# Patient Record
Sex: Male | Born: 1945
Health system: Southern US, Community
[De-identification: ages and names within clinical notes are randomized; demographics above are authoritative.]

## PROBLEM LIST (undated history)

## (undated) DIAGNOSIS — R0902 Hypoxemia: Secondary | ICD-10-CM

## (undated) DIAGNOSIS — I2510 Atherosclerotic heart disease of native coronary artery without angina pectoris: Secondary | ICD-10-CM

## (undated) DIAGNOSIS — I739 Peripheral vascular disease, unspecified: Secondary | ICD-10-CM

## (undated) DIAGNOSIS — G5600 Carpal tunnel syndrome, unspecified upper limb: Secondary | ICD-10-CM

## (undated) DIAGNOSIS — E785 Hyperlipidemia, unspecified: Secondary | ICD-10-CM

## (undated) DIAGNOSIS — E119 Type 2 diabetes mellitus without complications: Secondary | ICD-10-CM

## (undated) DIAGNOSIS — C61 Malignant neoplasm of prostate: Secondary | ICD-10-CM

## (undated) DIAGNOSIS — G709 Myoneural disorder, unspecified: Secondary | ICD-10-CM

## (undated) DIAGNOSIS — I10 Essential (primary) hypertension: Secondary | ICD-10-CM

## (undated) DIAGNOSIS — T783XXA Angioneurotic edema, initial encounter: Secondary | ICD-10-CM

## (undated) DIAGNOSIS — C801 Malignant (primary) neoplasm, unspecified: Secondary | ICD-10-CM

## (undated) HISTORY — DX: Peripheral vascular disease, unspecified: I73.9

## (undated) HISTORY — DX: Carpal tunnel syndrome, unspecified upper limb: G56.00

## (undated) HISTORY — DX: Hypoxemia: R09.02

## (undated) HISTORY — DX: Hyperlipidemia, unspecified: E78.5

## (undated) HISTORY — DX: Malignant (primary) neoplasm, unspecified: C80.1

## (undated) HISTORY — DX: Essential (primary) hypertension: I10

## (undated) HISTORY — DX: Type 2 diabetes mellitus without complications: E11.9

## (undated) HISTORY — DX: Atherosclerotic heart disease of native coronary artery without angina pectoris: I25.10

## (undated) HISTORY — DX: Angioneurotic edema, initial encounter: T78.3XXA

## (undated) HISTORY — PX: HERNIA REPAIR: SHX51

## (undated) HISTORY — PX: COLON SURGERY: SHX602

---

## 1998-08-27 ENCOUNTER — Ambulatory Visit (HOSPITAL_COMMUNITY): Admission: RE | Admit: 1998-08-27 | Discharge: 1998-08-27 | Payer: Self-pay | Admitting: Orthopedic Surgery

## 1998-08-27 ENCOUNTER — Encounter: Payer: Self-pay | Admitting: Orthopedic Surgery

## 2002-02-05 ENCOUNTER — Emergency Department (HOSPITAL_COMMUNITY): Admission: EM | Admit: 2002-02-05 | Discharge: 2002-02-05 | Payer: Self-pay | Admitting: Emergency Medicine

## 2003-01-22 HISTORY — PX: PROSTATE SURGERY: SHX751

## 2003-09-13 ENCOUNTER — Encounter: Admission: RE | Admit: 2003-09-13 | Discharge: 2003-09-13 | Payer: Self-pay | Admitting: Internal Medicine

## 2004-08-09 ENCOUNTER — Inpatient Hospital Stay (HOSPITAL_COMMUNITY): Admission: EM | Admit: 2004-08-09 | Discharge: 2004-08-10 | Payer: Self-pay | Admitting: Emergency Medicine

## 2004-08-09 ENCOUNTER — Encounter (INDEPENDENT_AMBULATORY_CARE_PROVIDER_SITE_OTHER): Payer: Self-pay | Admitting: *Deleted

## 2004-08-20 ENCOUNTER — Ambulatory Visit (HOSPITAL_COMMUNITY): Admission: RE | Admit: 2004-08-20 | Discharge: 2004-08-20 | Payer: Self-pay | Admitting: Cardiology

## 2009-06-16 ENCOUNTER — Emergency Department (HOSPITAL_COMMUNITY): Admission: EM | Admit: 2009-06-16 | Discharge: 2009-06-16 | Payer: Self-pay | Admitting: Emergency Medicine

## 2010-04-09 LAB — URINALYSIS, ROUTINE W REFLEX MICROSCOPIC
Bilirubin Urine: NEGATIVE
Hgb urine dipstick: NEGATIVE
Specific Gravity, Urine: 1.02 (ref 1.005–1.030)
pH: 5 (ref 5.0–8.0)

## 2010-04-09 LAB — DIFFERENTIAL
Basophils Relative: 1 % (ref 0–1)
Lymphocytes Relative: 51 % — ABNORMAL HIGH (ref 12–46)
Monocytes Absolute: 0.5 10*3/uL (ref 0.1–1.0)
Monocytes Relative: 5 % (ref 3–12)
Neutro Abs: 4.2 10*3/uL (ref 1.7–7.7)

## 2010-04-09 LAB — COMPREHENSIVE METABOLIC PANEL
Albumin: 4.3 g/dL (ref 3.5–5.2)
Alkaline Phosphatase: 113 U/L (ref 39–117)
BUN: 50 mg/dL — ABNORMAL HIGH (ref 6–23)
GFR calc Af Amer: 18 mL/min — ABNORMAL LOW (ref >60)
Potassium: 3.7 mEq/L (ref 3.5–5.1)
Total Protein: 8.3 g/dL (ref 6.0–8.3)

## 2010-04-09 LAB — CBC
HCT: 48.3 % (ref 39.0–52.0)
Platelets: 242 10*3/uL (ref 150–400)
RDW: 14.1 % (ref 11.5–15.5)

## 2010-06-08 NOTE — H&P (Signed)
NAME:  Alexander [name], Alexander [name] NO.:  [id number]   MEDICAL RECORD NO.:  [id number]          PATIENT TYPE:  EMS   LOCATION:  [facility]                         FACILITY:  [facility]   PHYSICIAN:  Lonia Blood, M.D.      DATE OF BIRTH:  [birth year]   DATE OF ADMISSION:  08/09/2004  DATE OF DISCHARGE:                                HISTORY & PHYSICAL   PRIMARY CARE PHYSICIAN:  [facility].   CHIEF COMPLAINT:  Shoulder pain.   HISTORY OF PRESENT ILLNESS:  The patient is a 65 year old African-American  male with history of hypertension and recently diagnosed prostate cancer,  who goes to [facility]. He went in today secondary to his shoulder  pain and generalized muscle aches that has been going on for a while. At  work the patient does heavy lifting and believed that was responsible. At  [facility] after checking an EKG they were concerned that he may have a cardiac  reason for his symptoms and subsequently sent him over to the ER and called  [facility] to see the patient. There was no chest pain. He was given  aspirin there and they wanted him to have myocardial infarction ruled out.  While in the emergency department patient had a basic work up and his  creatinine was found to be 4.7. Subsequently cardiology was called who  decided the patient did not have any cardiac reasons. EKG changes were said  to be mainly secondary to lead placement versus cardiomegaly. Hence we were  called to admit the patient.   PAST MEDICAL HISTORY:  1.  Mainly hypertension.  2.  Recently diagnosed with possible prostate cancer according to him.   MEDICATIONS:  Diovan 80 mg daily.   ALLERGIES:  No known drug allergies.   SOCIAL HISTORY:  He is married and lives with his wife. Very much sexually  active. He is also active otherwise. No tobacco or alcohol use. The patient  smokes about 1/2 pack per day.   FAMILY HISTORY:  Significant mainly for high blood pressure in his family.   REVIEW OF SYSTEMS:  The patient denied weight gain or weight loss. Denied  any shortness of breath. No cough, no chest pain. Denied any urinary  symptoms. The patient has had some post coital bleeding but that has  resolved now. Otherwise a 10 point review of systems is as in history of  present illness.   PHYSICAL EXAMINATION:  VITAL SIGNS:  Temperature 98.3, blood pressure  172/74, pulse 76, respiratory rate 22. Saturations 98% on room air.  GENERAL:  He is alert, oriented, in no acute distress.  HEENT:  Pupils are equal, round and reactive to light. Extraocular movements  are intact.  NECK:  Supple, no JVD, no lymphadenopathy.  RESPIRATORY:  Good air entry bilaterally. No wheezes, no rales.  CARDIOVASCULAR:  He has a regular rate and rhythm.  ABDOMEN:  Soft, nontender with positive bowel sounds.  EXTREMITIES:  Show no clubbing, cyanosis or edema.   LABORATORY DATA:  Sodium 134, potassium 3.9, chloride 104, BUN 36,  creatinine 4.7, glucose 130. Hemoglobin  was 17. His EKG showed normal sinus  rhythm with diffuse T wave inversion in the anterolateral leads. Normal  intervals.   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 The  patient is here mainly with muscle aches and ST changes consistent with  inferolateral ischemia but with no chest pain. Initial cardiac enzymes in  the emergency department are found to be negative at this point. The patient  has been admitted mainly for work up of what appears to be acute renal  failure. We do not have old labs, it is therefore difficult to tell if this  is truly acute. We also do not know anything about his cholesterol levels,  but he does have risk factors for cardiac disease. His renal failure could  be resolved using his ARB, especially in the setting of dehydration (if he  had any). Another possibility is that he is having obstructive  uropathy,  especially with a history of prostate cancer.   PLAN:  1.  Acute renal failure. Will assume this is acute and will work it up as if      it is acute. I will check an renal ultrasound. Check spot urine sodium      and creatinine. Check his FENA.  Will also check uroflow just in case this is an ATN. Will follow his renal  function closely after hydrating patient adequately. If his creatinine does  not drop adequately, then we may have to call nephrology and further work up  may include renal biopsy.   1.  Abnormal EKG. I doubt this is cardiac in nature, however, due to the      prevailing symptoms and the risk factors for heart disease in the      patient including hypertension as well as tobacco smoking and being a      male, will go ahead and put him on telemetry, check serial cardiac      enzymes and rule him out for myocardial infarction. He has already been      seen by  [facility] and if his enzymes come back as positive      will anything indicates that this may be cardiac in nature, we will call      them back to see the patient. I will repeat an EKG in the morning.   1.  History of prostate cancer. The patient seems to be stable at this point      and he has already seen a urologist in [facility]. Unless we have      something obstructed that requires any further urologic work up, will      let patient follow up with his urologist after his hospital stay.   1.  Hypertension. His blood pressure seems to be well controlled and I am      going to hold the Diovan due to his renal failure. If his blood pressure      spikes I will try to use something that does not have an renal      implication.      Lonia Blood, M.D.  Electronically Signed     LG/MEDQ  D:  08/09/2004  T:  08/10/2004  Job:  [id number]

## 2010-12-27 ENCOUNTER — Ambulatory Visit: Payer: Self-pay

## 2010-12-27 DIAGNOSIS — I10 Essential (primary) hypertension: Secondary | ICD-10-CM

## 2010-12-27 DIAGNOSIS — J309 Allergic rhinitis, unspecified: Secondary | ICD-10-CM

## 2011-02-27 ENCOUNTER — Telehealth: Payer: Self-pay

## 2011-02-27 NOTE — Telephone Encounter (Signed)
PT IS WANTING TO TALK WITH SOMEONE ABOUT HIS BLOOD PRESSURE MEDICATION

## 2011-02-28 NOTE — Telephone Encounter (Signed)
SPOKE WITH PT, HE NEEDS REFILL ON HIS BP MED BYSTOLIC 10 MG ONCE DAILY. CALL INTO [facility] ON [address]. PT TRANSFERRED TO APPT TO MAKE APPT FOR F/U

## 2011-03-09 ENCOUNTER — Encounter: Payer: Self-pay | Admitting: Family Medicine

## 2011-03-09 DIAGNOSIS — I10 Essential (primary) hypertension: Secondary | ICD-10-CM | POA: Insufficient documentation

## 2011-03-09 DIAGNOSIS — Z8546 Personal history of malignant neoplasm of prostate: Secondary | ICD-10-CM | POA: Insufficient documentation

## 2011-03-11 ENCOUNTER — Ambulatory Visit: Payer: Medicare Other | Admitting: Family Medicine

## 2011-04-02 ENCOUNTER — Ambulatory Visit (INDEPENDENT_AMBULATORY_CARE_PROVIDER_SITE_OTHER): Payer: Medicare Other | Admitting: Family Medicine

## 2011-04-02 VITALS — BP 180/106 | HR 80 | Temp 98.3°F | Resp 16 | Ht 64.5 in | Wt 147.2 lb

## 2011-04-02 DIAGNOSIS — C801 Malignant (primary) neoplasm, unspecified: Secondary | ICD-10-CM

## 2011-04-02 DIAGNOSIS — J309 Allergic rhinitis, unspecified: Secondary | ICD-10-CM

## 2011-04-02 DIAGNOSIS — Z8709 Personal history of other diseases of the respiratory system: Secondary | ICD-10-CM

## 2011-04-02 DIAGNOSIS — I10 Essential (primary) hypertension: Secondary | ICD-10-CM

## 2011-04-02 MED ORDER — FLUTICASONE PROPIONATE 50 MCG/ACT NA SUSP: 2.0000 | Freq: Every day | NASAL | Status: DC

## 2011-04-02 MED ORDER — LABETALOL HCL 200 MG PO TABS: 200.0000 mg | ORAL_TABLET | Freq: Two times a day (BID) | ORAL | Status: DC

## 2011-04-02 MED ORDER — LABETALOL HCL 100 MG PO TABS
200.0000 mg | ORAL_TABLET | Freq: Once | ORAL | Status: AC
  Administered 2011-04-02: 200 mg via ORAL

## 2011-04-03 LAB — COMPREHENSIVE METABOLIC PANEL
ALT: 14 U/L (ref 0–53)
AST: 16 U/L (ref 0–37)
Creat: 1 mg/dL (ref 0.50–1.35)
Total Bilirubin: 0.4 mg/dL (ref 0.3–1.2)

## 2011-04-03 LAB — LIPID PANEL
HDL: 43 mg/dL (ref >39)
LDL Cholesterol: 137 mg/dL — ABNORMAL HIGH (ref 0–99)
Total CHOL/HDL Ratio: 4.7 Ratio
Triglycerides: 102 mg/dL (ref <150)
VLDL: 20 mg/dL (ref 0–40)

## 2011-04-08 DIAGNOSIS — C801 Malignant (primary) neoplasm, unspecified: Secondary | ICD-10-CM | POA: Insufficient documentation

## 2011-04-08 DIAGNOSIS — J310 Chronic rhinitis: Secondary | ICD-10-CM | POA: Insufficient documentation

## 2011-04-08 DIAGNOSIS — I10 Essential (primary) hypertension: Secondary | ICD-10-CM | POA: Insufficient documentation

## 2011-04-08 NOTE — Progress Notes (Signed)
  Subjective:    Patient ID: Alexander [name], male    DOB: 09-07-1945, 66 y.o.   MRN: [id number]  HPI Patient presents for medication refills. He has been without his medications for several weeks.    Review of Systems  Respiratory: Negative for shortness of breath.   Cardiovascular: Negative for chest pain.  Neurological: Negative for light-headedness and headaches.       Objective:   Physical Exam  Constitutional: He appears well-developed.  HENT:  Nose: Mucosal edema present.  Neck: Neck supple.  Cardiovascular: Normal rate, regular rhythm and normal heart sounds.   Pulmonary/Chest: Effort normal and breath sounds normal.  Neurological: He is alert.  Psychiatric: He has a normal mood and affect.          Assessment & Plan:   1. HTN (hypertension)  Lipid panel, Comprehensive metabolic panel, labetalol (NORMODYNE) tablet 200 mg  2. Allergic rhinitis    3. Prostate cancer    4. History of asthma     See medications prescribed on AVS Patient to return to clinic in 6 weeks to recheck BP. Anticipatory guidance

## 2011-04-10 ENCOUNTER — Telehealth: Payer: Self-pay

## 2011-04-10 NOTE — Telephone Encounter (Signed)
Gave pt results of labs and pt agreed to resume meds and f/up in several wks for BP check

## 2011-04-10 NOTE — Telephone Encounter (Signed)
Message copied by Jerelene Redden on Wed Apr 10, 2011 10:15 AM ------      Message from: Dois Davenport      Created: Tue Apr 09, 2011  6:18 PM       Please contact patient and let him know his labs are OK.  He was to resume his medications and follow up in several weeks to recheck his BP. Thanks

## 2011-04-11 ENCOUNTER — Ambulatory Visit (INDEPENDENT_AMBULATORY_CARE_PROVIDER_SITE_OTHER): Payer: Medicare Other | Admitting: Family Medicine

## 2011-04-11 VITALS — BP 198/76 | HR 76 | Temp 97.3°F | Resp 16 | Ht 64.5 in | Wt 149.6 lb

## 2011-04-11 DIAGNOSIS — M79609 Pain in unspecified limb: Secondary | ICD-10-CM

## 2011-04-11 DIAGNOSIS — G5760 Lesion of plantar nerve, unspecified lower limb: Secondary | ICD-10-CM

## 2011-04-11 MED ORDER — HYDROCODONE-ACETAMINOPHEN 5-500 MG PO TABS: 1.0000 | ORAL_TABLET | Freq: Three times a day (TID) | ORAL | Status: DC | PRN

## 2011-04-11 MED ORDER — METHYLPREDNISOLONE 4 MG PO TABS: ORAL_TABLET | ORAL | Status: DC

## 2011-04-11 NOTE — Progress Notes (Signed)
66 year old retired man with 2 weeks of progressive throbbing and shooting left foot pain into the second and third phalanx sees. He's had this problem before and received a cortisone shot on the bottom of the foot. This worked for a while but the pain has come back and over the past 2 days been especially bothersome keeping him awake at night. He does have a pain with weightbearing or movement. The pain simply comes on its own and is sharp, spontaneous, involving second third left toes.  Objective: Normal inspection, range of motion, palpation.  Skin is warm and dry. Straight leg raising is negative assessment: Morton's neuroma  Plan Medrol and Vicodin, if no better 48 hours return for

## 2011-04-11 NOTE — Patient Instructions (Signed)
Morton's Neuroma Neuralgia (nerve pain) or neuroma (benign [non-cancerous] nerve tumor) may develop on any interdigital nerve. The interdigital nerves (nerves between digits) of the foot travel beneath and between the metatarsals (long bones of the fore foot) and pass the nerve endings to the toes. The third interdigital is a common place for a small neuroma to form called Morton's neuroma. Another nerve to be affected commonly is the fourth interdigital nerve. This would be in approximately in the area of the base or ball under the bottom of your fourth toe. This condition occurs more commonly in women and is usually on one side. It is usually first noticed by pain radiating (spreading) to the ball of the foot or to the toes. CAUSES The cause of interdigital neuralgia may be from low grade repetitive trauma (damage caused by an accident) as in activities causing a repeated pounding of the foot (running, jumping etc.). It is also caused by improper footwear or recent loss of the fatty padding on the bottom of the foot. TREATMENT  The condition often resolves (goes away) simply with decreasing activity if that is thought to be the cause. Proper shoes are beneficial. Orthotics (special foot support aids) such as a metatarsal bar are often beneficial. This condition usually responds to conservative therapy, however if surgery is necessary it usually brings complete relief. HOME CARE INSTRUCTIONS   Apply ice to the area of soreness for 15 to 20 minutes, 3 to 4 times per day, while awake for the first 2 days. Put ice in a plastic bag and place a towel between the bag of ice and your skin.   Only take over-the-counter or prescription medicines for pain, discomfort, or fever as directed by your caregiver.  MAKE SURE YOU:   Understand these instructions.   Will watch your condition.   Will get help right away if you are not doing well or get worse.  Document Released: 04/15/2000 Document Revised:  12/27/2010 Document Reviewed: 01/07/2005 Exit[employer] Patient Information 2012 [employer]. 

## 2011-04-15 ENCOUNTER — Ambulatory Visit (INDEPENDENT_AMBULATORY_CARE_PROVIDER_SITE_OTHER): Payer: Medicare Other | Admitting: Family Medicine

## 2011-04-15 VITALS — BP 179/74 | HR 82 | Temp 98.2°F | Resp 18 | Ht 64.5 in | Wt 147.4 lb

## 2011-04-15 DIAGNOSIS — M79609 Pain in unspecified limb: Secondary | ICD-10-CM

## 2011-04-15 DIAGNOSIS — M79673 Pain in unspecified foot: Secondary | ICD-10-CM

## 2011-04-15 NOTE — Progress Notes (Signed)
  Subjective:    Patient ID: Alexander [name], male    DOB: January 29, 1945, 66 y.o.   MRN: [id number]  HPI 66 yo male seen here 4 days ago for foot pain. Dx with morton's neuroma.  Given medrol and vicodin.  Did get better but bothering him again this morning.  Hasn't bothered him this evening since he took a shower.  Didn't hurt near as bad as it did originally.  Doing much better now.       Review of Systems Negative except as per HPI     Objective:   Physical Exam  Constitutional: He appears well-developed.  Pulmonary/Chest: Effort normal.  Neurological: He is alert.   Negative pain to palpation.  No swelling or erythema       Assessment & Plan:  Foot pain - resolving.  F/U prn

## 2011-04-19 ENCOUNTER — Other Ambulatory Visit: Payer: Self-pay | Admitting: Family Medicine

## 2011-04-19 ENCOUNTER — Telehealth: Payer: Self-pay | Admitting: Internal Medicine

## 2011-04-19 NOTE — Telephone Encounter (Signed)
Refilled Vicodin 5/500 #20 one po TID NO REFILLS.  Called into [facility] by Eddie Candle, PA-C

## 2011-06-16 ENCOUNTER — Ambulatory Visit (INDEPENDENT_AMBULATORY_CARE_PROVIDER_SITE_OTHER): Payer: Medicare Other | Admitting: Family Medicine

## 2011-06-16 VITALS — BP 182/91 | HR 81 | Temp 98.3°F | Resp 16 | Ht 64.5 in | Wt 152.2 lb

## 2011-06-16 DIAGNOSIS — R5381 Other malaise: Secondary | ICD-10-CM

## 2011-06-16 DIAGNOSIS — R5383 Other fatigue: Secondary | ICD-10-CM

## 2011-06-16 LAB — POCT URINALYSIS DIPSTICK
Bilirubin, UA: NEGATIVE
Blood, UA: NEGATIVE
Glucose, UA: NEGATIVE
Ketones, UA: NEGATIVE
Leukocytes, UA: NEGATIVE
Nitrite, UA: NEGATIVE
Spec Grav, UA: >=1.03
Urobilinogen, UA: 1
pH, UA: 6

## 2011-06-16 LAB — POCT CBC
Granulocyte percent: 46.9 % (ref 37–80)
HCT, POC: 42.2 % — AB (ref 43.5–53.7)
Hemoglobin: 15.2 g/dL (ref 14.1–18.1)
Lymph, poc: 3.6 — AB (ref 0.6–3.4)
MCH, POC: 32.5 pg — AB (ref 27–31.2)
MCHC: 36 g/dL — AB (ref 31.8–35.4)
MCV: 90.4 fL (ref 80–97)
MID (cbc): 0.7 (ref 0–0.9)
MPV: 10.7 fL (ref 0–99.8)
POC Granulocyte: 3.8 (ref 2–6.9)
POC LYMPH PERCENT: 45 % (ref 10–50)
POC MID %: 8.1 % (ref 0–12)
Platelet Count, POC: 229 K/uL (ref 142–424)
RBC: 4.67 M/uL — AB (ref 4.69–6.13)
RDW, POC: 14.5 %
WBC: 8.1 K/uL (ref 4.6–10.2)

## 2011-06-16 LAB — POCT UA - MICROSCOPIC ONLY
Bacteria, U Microscopic: NEGATIVE
Casts, Ur, LPF, POC: NEGATIVE
Crystals, Ur, HPF, POC: NEGATIVE
Epithelial cells, urine per micros: NEGATIVE
RBC, urine, microscopic: NEGATIVE
WBC, Ur, HPF, POC: NEGATIVE
Yeast, UA: NEGATIVE

## 2011-06-16 NOTE — Progress Notes (Signed)
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  F/Hx:  Positive for DM  Obj:  NAD HEENT:  Poor dentition Chest:  Clear Heart:  Reg, no murmur Abd:  Soft, no HSM, nontender Ext:  No edema Skin: clear  A:  Fatigue without obvious cause  P: check CBC, thyroid, CMET

## 2011-06-16 NOTE — Patient Instructions (Signed)

## 2011-06-17 LAB — COMPREHENSIVE METABOLIC PANEL
ALT: 14 U/L (ref 0–53)
AST: 14 U/L (ref 0–37)
Albumin: 4.3 g/dL (ref 3.5–5.2)
Alkaline Phosphatase: 127 U/L — ABNORMAL HIGH (ref 39–117)
BUN: 11 mg/dL (ref 6–23)
CO2: 25 mEq/L (ref 19–32)
Calcium: 9.4 mg/dL (ref 8.4–10.5)
Chloride: 104 mEq/L (ref 96–112)
Creat: 1 mg/dL (ref 0.50–1.35)
Glucose, Bld: 139 mg/dL — ABNORMAL HIGH (ref 70–99)
Potassium: 4 mEq/L (ref 3.5–5.3)
Sodium: 137 mEq/L (ref 135–145)
Total Bilirubin: 0.4 mg/dL (ref 0.3–1.2)
Total Protein: 6.9 g/dL (ref 6.0–8.3)

## 2011-06-17 LAB — POCT SEDIMENTATION RATE: POCT SED RATE: 18 mm/hr (ref 0–22)

## 2011-06-17 LAB — TSH: TSH: 1.322 u[IU]/mL (ref 0.350–4.500)

## 2011-06-20 ENCOUNTER — Telehealth: Payer: Self-pay | Admitting: *Deleted

## 2011-06-20 DIAGNOSIS — I10 Essential (primary) hypertension: Secondary | ICD-10-CM

## 2011-06-20 DIAGNOSIS — M549 Dorsalgia, unspecified: Secondary | ICD-10-CM

## 2011-06-20 DIAGNOSIS — F329 Major depressive disorder, single episode, unspecified: Secondary | ICD-10-CM

## 2011-06-20 DIAGNOSIS — F32A Depression, unspecified: Secondary | ICD-10-CM

## 2011-06-20 MED ORDER — LABETALOL HCL 200 MG PO TABS: 200.0000 mg | ORAL_TABLET | Freq: Two times a day (BID) | ORAL | Status: DC

## 2011-06-20 MED ORDER — NEBIVOLOL HCL 10 MG PO TABS: 10.0000 mg | ORAL_TABLET | Freq: Every day | ORAL | Status: DC

## 2011-06-20 MED ORDER — HYDROCODONE-ACETAMINOPHEN 5-500 MG PO TABS: 1.0000 | ORAL_TABLET | Freq: Three times a day (TID) | ORAL | Status: DC | PRN

## 2011-06-20 MED ORDER — FLUOXETINE HCL 20 MG PO TABS: 20.0000 mg | ORAL_TABLET | Freq: Every day | ORAL | Status: DC

## 2011-06-20 NOTE — Telephone Encounter (Signed)
meds refilled 

## 2011-06-20 NOTE — Telephone Encounter (Signed)
Pt notified that rx's were sent into pharmacy

## 2011-06-20 NOTE — Telephone Encounter (Signed)
Pt would like to start on an antidepressant and he would like a refill on blood pressure med and pain med.

## 2011-08-24 ENCOUNTER — Other Ambulatory Visit: Payer: Self-pay | Admitting: Internal Medicine

## 2011-08-24 NOTE — Telephone Encounter (Signed)
Would like Dr. Elbert Ewings to authorize refill for hydrocodone RX. Uses [facility], patient phone # [phone].

## 2012-08-26 ENCOUNTER — Ambulatory Visit (INDEPENDENT_AMBULATORY_CARE_PROVIDER_SITE_OTHER): Payer: Medicare Other | Admitting: Family Medicine

## 2012-08-26 VITALS — BP 148/82 | HR 60 | Temp 97.6°F | Resp 16 | Ht 64.5 in | Wt 138.6 lb

## 2012-08-26 DIAGNOSIS — M79609 Pain in unspecified limb: Secondary | ICD-10-CM

## 2012-08-26 DIAGNOSIS — M79605 Pain in left leg: Secondary | ICD-10-CM

## 2012-08-26 MED ORDER — PREDNISONE 20 MG PO TABS: ORAL_TABLET | ORAL | Status: DC

## 2012-08-26 MED ORDER — HYDROCODONE-ACETAMINOPHEN 10-325 MG PO TABS: 1.0000 | ORAL_TABLET | Freq: Three times a day (TID) | ORAL | Status: DC | PRN

## 2012-08-26 NOTE — Progress Notes (Signed)
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  Pain awoke patient about 4 am.  He has a h/o sciatica 23 years ago.  Most of pain has resolved with BC powders.  Objective:  NAD Left foot and leg:  nontender Good pedal pulses No edema Negative SLR Full left leg ROM from hip to foot  Assessment:  Presumed sciatica, but may be a morton's neuroma  Left leg pain - Plan: predniSONE (DELTASONE) 20 MG tablet, HYDROcodone-acetaminophen (NORCO) 10-325 MG per tablet  Signed, Elvina Sidle, MD

## 2012-09-09 ENCOUNTER — Encounter (INDEPENDENT_AMBULATORY_CARE_PROVIDER_SITE_OTHER): Payer: Medicare Other | Admitting: Family Medicine

## 2012-09-28 ENCOUNTER — Ambulatory Visit (INDEPENDENT_AMBULATORY_CARE_PROVIDER_SITE_OTHER): Payer: Medicare Other | Admitting: Family Medicine

## 2012-09-28 VITALS — BP 180/84 | HR 80 | Temp 98.0°F | Resp 17 | Ht 64.5 in | Wt 138.0 lb

## 2012-09-28 DIAGNOSIS — R1031 Right lower quadrant pain: Secondary | ICD-10-CM

## 2012-09-28 DIAGNOSIS — K5900 Constipation, unspecified: Secondary | ICD-10-CM

## 2012-09-28 DIAGNOSIS — M5432 Sciatica, left side: Secondary | ICD-10-CM

## 2012-09-28 DIAGNOSIS — M5430 Sciatica, unspecified side: Secondary | ICD-10-CM

## 2012-09-28 NOTE — Progress Notes (Signed)
  Subjective:    Patient ID: Alexander [name], male    DOB: 1945/06/21, 67 y.o.   MRN: [id number]  HPI  67 y.o. Male presents to clinic for follow up of left leg pain . Denies any trouble with leg feeling as if it is going to give out. Has not had any swelling or soreness to touch. Problem has been going on for over a month.  Had trouble with this years ago.  Patient is a retired Actor.  Was prescribed prednisone but states that pain didn't really let up . Notices in the morning upon waking up he has a burning sensation in the back of the leg. Last about all day long.   Patient also having some discomfort in his right lower quadrant. He's been constipated and has been going on for couple weeks. Said no blood per rectum. He's never had a colonoscopy.  Review of Systems No weight loss Has had prostate surgery in the past for cancer of the prostate    Objective:   Physical Exam Straight-leg raising negative Muscle mass is normal both legs Reflexes are diminished in the left knee and left ankle. He has no edema . Skin is normal bilaterally Heart is regular no murmur Chest: Clear Abdomen: Soft nontender with laparoscopy scars in the right upper quadrant and midline. He has some fullness in the right lower quadrant.     Assessment & Plan:  Sciatica, left - Plan: Ambulatory referral to Neurology, MR Lumbar Spine Wo Contrast  Unspecified constipation - Plan: Ambulatory referral to Gastroenterology  RLQ abdominal pain - Plan: Ambulatory referral to Gastroenterology  Signed, Elvina Sidle, MD

## 2012-09-30 ENCOUNTER — Encounter: Payer: Self-pay | Admitting: Diagnostic Neuroimaging

## 2012-09-30 ENCOUNTER — Ambulatory Visit (INDEPENDENT_AMBULATORY_CARE_PROVIDER_SITE_OTHER): Payer: Medicare Other | Admitting: Diagnostic Neuroimaging

## 2012-09-30 VITALS — BP 158/80 | HR 66 | Temp 98.9°F | Ht 65.5 in | Wt 141.0 lb

## 2012-09-30 DIAGNOSIS — M5416 Radiculopathy, lumbar region: Secondary | ICD-10-CM

## 2012-09-30 DIAGNOSIS — IMO0002 Reserved for concepts with insufficient information to code with codable children: Secondary | ICD-10-CM

## 2012-09-30 MED ORDER — GABAPENTIN 300 MG PO CAPS: 300.0000 mg | ORAL_CAPSULE | Freq: Three times a day (TID) | ORAL | Status: DC

## 2012-09-30 NOTE — Patient Instructions (Signed)
Start gabapentin 300 mg tablet at nighttime. Gradually increase to twice a day or 3 times a day as tolerated. Try to stay on a regular dose for at least next 4-6 weeks.

## 2012-09-30 NOTE — Progress Notes (Signed)
[facility]  PATIENT: Alexander [name] DOB: 02/14/1945  REFERRING CLINICIAN: Lauenstein HISTORY FROM: patient REASON FOR VISIT: new consult   HISTORICAL  CHIEF COMPLAINT:  Chief Complaint  Patient presents with  . NP sciatica    rm 6    HISTORY OF PRESENT ILLNESS:   67 year old right-handed male with history of hypertension, here for evaluation of low back pain radiating to left leg.  Patient reports long history of back problems since age 21's. At age 15 his back "gave out". He had severe low back pain radiating to left leg. He was taking Celebrex at some point but this caused blood in the stool. He had MRI scan of the lumbar spine in 2000 which showed disc bulging and "spurring". He did not pursue surgical intervention at that time.  Patient was doing fairly well until 2 months ago when he had recurrence of severe left hip, left buttock and left leg burning pain. He feels severe pressure from his left knee down to his left foot. Patient has been treated with course of prednisone with mild relief. He is also on hydrocodone/Tylenol with mild relief. He has not tried physical therapy or epidural steroid injections.  REVIEW OF SYSTEMS: Full 14 system review of systems performed and notable only for low back pain and left leg pain.  ALLERGIES: No Known Allergies  HOME MEDICATIONS: Prior to Admission medications   Medication Sig Start Date End Date Taking? Authorizing Provider  HYDROcodone-acetaminophen (NORCO) 10-325 MG per tablet Take 1 tablet by mouth every 8 (eight) hours as needed for pain. 08/26/12  Yes Elvina Sidle, MD  labetalol (NORMODYNE) 200 MG tablet Take 1 tablet (200 mg total) by mouth 2 (two) times daily. 06/20/11  Yes Elvina Sidle, MD  nebivolol (BYSTOLIC) 10 MG tablet Take 1 tablet (10 mg total) by mouth daily. 06/20/11  Yes Elvina Sidle, MD  gabapentin (NEURONTIN) 300 MG capsule Take 1 capsule (300 mg total) by mouth 3 (three) times  daily. 09/30/12   Suanne Marker, MD   Outpatient Prescriptions Prior to Visit  Medication Sig Dispense Refill  . HYDROcodone-acetaminophen (NORCO) 10-325 MG per tablet Take 1 tablet by mouth every 8 (eight) hours as needed for pain.  30 tablet  0  . labetalol (NORMODYNE) 200 MG tablet Take 1 tablet (200 mg total) by mouth 2 (two) times daily.  60 tablet  2  . nebivolol (BYSTOLIC) 10 MG tablet Take 1 tablet (10 mg total) by mouth daily.  30 tablet  6  . predniSONE (DELTASONE) 20 MG tablet 2 daily with food  10 tablet  1   No facility-administered medications prior to visit.    PAST MEDICAL HISTORY: Past Medical History  Diagnosis Date  . Cancer     prostate  . Hypertension   . Asthma     PAST SURGICAL HISTORY: Past Surgical History  Procedure Laterality Date  . Prostate surgery  2005    FAMILY HISTORY: Family History  Problem Relation Age of Onset  . Diabetes Mother   . Depression Father     SOCIAL HISTORY:  History   Social History  . Marital Status: Married    Spouse Name: N/A    Number of Children: 2  . Years of Education: 10   Occupational History  . retired    Social History Main Topics  . Smoking status: Current Every Day Smoker -- 1.00 packs/day for 51 years    Types: Cigarettes  . Smokeless tobacco: Former Neurosurgeon  .  Alcohol Use: No  . Drug Use: No  . Sexual Activity: Yes   Other Topics Concern  . Not on file   Social History Narrative  . No narrative on file     PHYSICAL EXAM  Filed Vitals:   09/30/12 0831  BP: 158/80  Pulse: 66  Temp: 98.9 F (37.2 C)  TempSrc: Oral  Height: 5' 5.5" (1.664 m)  Weight: 141 lb (63.957 kg)    Not recorded    Body mass index is 23.1 kg/(m^2).  GENERAL EXAM: Patient is in no distress; STRAIGHT AND CROSSED LEG RAISE NEG. SMELLS OF CIG SMOKE.  CARDIOVASCULAR: Regular rate and rhythm, no murmurs, no carotid bruits  NEUROLOGIC: MENTAL STATUS: awake, alert, language fluent, comprehension intact,  naming intact CRANIAL NERVE: no papilledema on fundoscopic exam, pupils equal and reactive to light, visual fields full to confrontation, extraocular muscles intact, no nystagmus, facial sensation and strength symmetric, uvula midline, shoulder shrug symmetric, tongue midline. MOTOR: normal bulk and tone, full strength in the BUE, BLE SENSORY: normal and symmetric to light touch, pinprick, temperature and proprioception; RIGHT TOE VIB 12 SEC; LEFT TOE VIB 7 SEC. COORDINATION: finger-nose-finger, fine finger movements normal REFLEXES: BUE 2, RIGHT KNEE 3+, LEFT KNEE 3-, ANKLES 2. DOWN GOING TOES. GAIT/STATION: narrow based gait; STOOPED POSTURE. ANTALGIC GAIT. Able to walk on toes, heels and tandem; romberg is negative   DIAGNOSTIC DATA (LABS, IMAGING, TESTING) - I reviewed patient records, labs, notes, testing and imaging myself where available.  Lab Results  Component Value Date   WBC 8.1 06/16/2011   HGB 15.2 06/16/2011   HCT 42.2* 06/16/2011   MCV 90.4 06/16/2011   PLT 242 06/16/2009      Component Value Date/Time   NA 137 06/16/2011 1805   K 4.0 06/16/2011 1805   CL 104 06/16/2011 1805   CO2 25 06/16/2011 1805   GLUCOSE 139* 06/16/2011 1805   BUN 11 06/16/2011 1805   CREATININE 1.00 06/16/2011 1805   CREATININE 4.13* 06/16/2009 1945   CALCIUM 9.4 06/16/2011 1805   PROT 6.9 06/16/2011 1805   ALBUMIN 4.3 06/16/2011 1805   AST 14 06/16/2011 1805   ALT 14 06/16/2011 1805   ALKPHOS 127* 06/16/2011 1805   BILITOT 0.4 06/16/2011 1805   GFRNONAA 15* 06/16/2009 1945   GFRAA  Value: 18        The eGFR has been calculated using the MDRD equation. This calculation has not been validated in all clinical situations. eGFR's persistently <60 mL/min signify possible Chronic Kidney Disease.* 06/16/2009 1945   Lab Results  Component Value Date   CHOL 200 04/02/2011   HDL 43 04/02/2011   LDLCALC 137* 04/02/2011   TRIG 102 04/02/2011   CHOLHDL 4.7 04/02/2011   No results found for this basename: HGBA1C   No  results found for this basename: VITAMINB12   Lab Results  Component Value Date   TSH 1.322 06/16/2011   08/28/98 MRI LUMBAR SPINE: (report paraphrased; images not available) L3-4: mild spinal stenosis and biforaminal foraminal stenosis  L4-5: mild spinal stenosis and mild right and moderate left foraminal stenosis  L5-S1: moderate spinal stenosis and moderate right and mild left foraminal stenosis; mass effect upon right L5 and bilateral S1 roots  ASSESSMENT AND PLAN  67 y.o. year old male here with low back pain radiating to the left leg. 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 MRI lumbar spine pending (ordered by Dr. Milus Glazier).  PLAN: 1. Gabapentin 300mg  qhs --> TID 2. PT eval  Orders Placed This Encounter  Procedures  . Ambulatory referral to Physical Therapy    Meds ordered this encounter  Medications  . gabapentin (NEURONTIN) 300 MG capsule    Sig: Take 1 capsule (300 mg total) by mouth 3 (three) times daily.    Dispense:  90 capsule    Refill:  11    Return in about 3 months (around 12/30/2012) for with Edison Nasuti, MD 09/30/2012, 9:21 AM Certified in Neurology, Neurophysiology and Neuroimaging  [facility] [address], [address] [place], [place] [address] [phone]

## 2012-10-08 ENCOUNTER — Telehealth: Payer: Self-pay

## 2012-10-08 DIAGNOSIS — I10 Essential (primary) hypertension: Secondary | ICD-10-CM

## 2012-10-08 DIAGNOSIS — M79605 Pain in left leg: Secondary | ICD-10-CM

## 2012-10-08 NOTE — Telephone Encounter (Signed)
Patient called needs refill on pain med. Hydrocodone. Please let pt know if this can be done. [phone]  Pharmacy [place]

## 2012-10-09 MED ORDER — HYDROCODONE-ACETAMINOPHEN 10-325 MG PO TABS: 1.0000 | ORAL_TABLET | Freq: Three times a day (TID) | ORAL | Status: DC | PRN

## 2012-10-09 NOTE — Telephone Encounter (Signed)
Called patient to advise  °

## 2012-10-09 NOTE — Telephone Encounter (Signed)
Thanks. I have called.

## 2012-10-09 NOTE — Addendum Note (Signed)
Addended byCaffie Damme on: 10/09/2012 08:44 AM   Modules accepted: Orders

## 2012-10-09 NOTE — Telephone Encounter (Signed)
Please call in refill of Norco.  I am out of office today.

## 2012-10-14 ENCOUNTER — Inpatient Hospital Stay: Admission: RE | Admit: 2012-10-14 | Payer: Medicare Other | Source: Ambulatory Visit

## 2012-10-19 ENCOUNTER — Inpatient Hospital Stay: Admission: RE | Admit: 2012-10-19 | Payer: Medicare Other | Source: Ambulatory Visit

## 2012-10-19 ENCOUNTER — Ambulatory Visit: Payer: Medicare Other | Admitting: Physical Therapy

## 2012-10-20 ENCOUNTER — Ambulatory Visit: Payer: Medicare Other | Attending: Diagnostic Neuroimaging | Admitting: Physical Therapy

## 2012-10-21 ENCOUNTER — Telehealth: Payer: Self-pay | Admitting: Diagnostic Neuroimaging

## 2012-10-21 NOTE — Telephone Encounter (Signed)
Can pt increase dose of gabapentin?

## 2012-10-21 NOTE — Telephone Encounter (Signed)
I called pt. He can go up on gabapentin dosing. On 300mg  TID. I advised him to build up to 600mg  TID.   -VRP

## 2012-10-26 ENCOUNTER — Ambulatory Visit: Payer: Medicare Other | Attending: Diagnostic Neuroimaging

## 2012-10-26 DIAGNOSIS — R293 Abnormal posture: Secondary | ICD-10-CM | POA: Insufficient documentation

## 2012-10-26 DIAGNOSIS — M2550 Pain in unspecified joint: Secondary | ICD-10-CM | POA: Insufficient documentation

## 2012-10-26 DIAGNOSIS — IMO0001 Reserved for inherently not codable concepts without codable children: Secondary | ICD-10-CM | POA: Insufficient documentation

## 2012-10-27 ENCOUNTER — Ambulatory Visit
Admission: RE | Admit: 2012-10-27 | Discharge: 2012-10-27 | Disposition: A | Discharge status: Home / Self Care | Payer: Medicare Other | Source: Ambulatory Visit | Attending: Family Medicine | Admitting: Family Medicine

## 2012-10-27 ENCOUNTER — Ambulatory Visit (INDEPENDENT_AMBULATORY_CARE_PROVIDER_SITE_OTHER): Payer: Medicare Other | Admitting: General Surgery

## 2012-10-27 DIAGNOSIS — M5432 Sciatica, left side: Secondary | ICD-10-CM

## 2012-10-30 ENCOUNTER — Encounter (INDEPENDENT_AMBULATORY_CARE_PROVIDER_SITE_OTHER): Payer: Self-pay

## 2012-10-30 ENCOUNTER — Encounter (INDEPENDENT_AMBULATORY_CARE_PROVIDER_SITE_OTHER): Payer: Self-pay | Admitting: General Surgery

## 2012-10-30 ENCOUNTER — Ambulatory Visit (INDEPENDENT_AMBULATORY_CARE_PROVIDER_SITE_OTHER): Payer: Medicare Other | Admitting: General Surgery

## 2012-10-30 VITALS — BP 120/70 | HR 80 | Temp 98.3°F | Resp 15 | Ht 65.0 in | Wt 140.6 lb

## 2012-10-30 DIAGNOSIS — K4090 Unilateral inguinal hernia, without obstruction or gangrene, not specified as recurrent: Secondary | ICD-10-CM

## 2012-10-30 NOTE — Progress Notes (Signed)
Patient ID: Alexander [name], male   DOB: 05-18-1945, 67 y.o.   MRN: [id number]  Chief Complaint  Patient presents with  . New Evaluation    eval RIH    HPI Alexander [name] is a 67 y.o. male.  The patient is a 67 year old male referred by Dr. Elnoria Howard for evaluation of a right inguinal hernia. The patient states he has had some discomfort to his right inguinal area as well as a bulge. He had no discomfort in his left inguinal area.  Of note the patient's previous robotic prostate surgery. HPI  Past Medical History  Diagnosis Date  . Cancer     prostate  . Hypertension   . Asthma     Past Surgical History  Procedure Laterality Date  . Prostate surgery  2005    Family History  Problem Relation Age of Onset  . Diabetes Mother   . Depression Father     Social History History  Substance Use Topics  . Smoking status: Current Every Day Smoker -- 1.00 packs/day for 51 years    Types: Cigarettes  . Smokeless tobacco: Former Neurosurgeon  . Alcohol Use: No    No Known Allergies  Current Outpatient Prescriptions  Medication Sig Dispense Refill  . gabapentin (NEURONTIN) 300 MG capsule Take 1 capsule (300 mg total) by mouth 3 (three) times daily.  90 capsule  11  . HYDROcodone-acetaminophen (NORCO) 10-325 MG per tablet Take 1 tablet by mouth every 8 (eight) hours as needed for pain.  30 tablet  0  . labetalol (NORMODYNE) 200 MG tablet Take 1 tablet (200 mg total) by mouth 2 (two) times daily.  60 tablet  2  . nebivolol (BYSTOLIC) 10 MG tablet Take 1 tablet (10 mg total) by mouth daily.  30 tablet  6   No current facility-administered medications for this visit.    Review of Systems Review of Systems  Constitutional: Negative.   HENT: Negative.   Respiratory: Negative.   Cardiovascular: Negative.   Gastrointestinal: Negative.   Neurological: Negative.   All other systems reviewed and are negative.    Blood pressure 120/70, pulse 80, temperature 98.3 F (36.8 C), temperature  source Temporal, resp. rate 15, height 5\' 5"  (1.651 m), weight 140 lb 9.6 oz (63.776 kg).  Physical Exam Physical Exam  Constitutional: He is oriented to person, place, and time. He appears well-developed and well-nourished.  HENT:  Head: Normocephalic and atraumatic.  Eyes: Conjunctivae and EOM are normal. Pupils are equal, round, and reactive to light.  Neck: Normal range of motion. Neck supple.  Cardiovascular: Normal rate, regular rhythm and normal heart sounds.   Pulmonary/Chest: Effort normal and breath sounds normal.  Abdominal: Soft. A hernia is present. Hernia confirmed positive in the right inguinal area. Hernia confirmed negative in the left inguinal area.  Musculoskeletal: Normal range of motion.  Neurological: He is alert and oriented to person, place, and time.  Skin: Skin is warm and dry.    Data Reviewed none  Assessment    67 year old male with a likely direct right inguinal hernia.     Plan    1. We'll proceed to the operating room for laparoscopic versus open right inguinal hernia repair with mesh. 2. I discussed with the patient the need to cut back on his smoking. We discussed the fact that this increases the chance for recurrence and others healing issues. The patient voiced understanding and stated he would cut back on his smoking. 3.All risks and benefits were  discussed with the patient, to generally include infection, bleeding, damage to surrounding structures, acute and chronic nerve pain, and recurrence. Alternatives were offered and described.  All questions were answered and the patient voiced understanding of the procedure and wishes to proceed at this point.         Marigene Ehlers., Retaj Hilbun 10/30/2012, 2:04 PM

## 2012-11-09 ENCOUNTER — Ambulatory Visit: Payer: Medicare Other

## 2012-11-16 ENCOUNTER — Other Ambulatory Visit (INDEPENDENT_AMBULATORY_CARE_PROVIDER_SITE_OTHER): Payer: Self-pay | Admitting: *Deleted

## 2012-11-16 DIAGNOSIS — K4090 Unilateral inguinal hernia, without obstruction or gangrene, not specified as recurrent: Secondary | ICD-10-CM

## 2012-11-16 MED ORDER — OXYCODONE-ACETAMINOPHEN 5-325 MG PO TABS: 1.0000 | ORAL_TABLET | ORAL | Status: DC | PRN

## 2012-11-18 ENCOUNTER — Telehealth (INDEPENDENT_AMBULATORY_CARE_PROVIDER_SITE_OTHER): Payer: Self-pay

## 2012-11-18 NOTE — Telephone Encounter (Signed)
Pt's daughter calling asking if Dr. Derrell Lolling can call in an Rx for stool softner. Daughter states that pt has tried using OTC things such as metamucil.  I suggested that pt start trying Miralax.  I also let daughter know that Dr. Derrell Lolling is out of the office today, but that I will send message to him as well as to [place].

## 2012-11-19 NOTE — Telephone Encounter (Signed)
Pt's daughter calling back b/c they never heard back from leaving the message yesterday. The pt has still not had a BM yet after only taking one colace and taking some Miralax. I advised the pt's daughter that the pt needs to take some otc Milk of Magnesia 4 Tblsp and he may need to repeat the dose after several hours. I advised pt that he needed to drink plenty of fluids to help with the constipation.

## 2012-11-23 ENCOUNTER — Telehealth (INDEPENDENT_AMBULATORY_CARE_PROVIDER_SITE_OTHER): Payer: Self-pay

## 2012-11-23 NOTE — Telephone Encounter (Signed)
Follow up call to patient.  Patient reports that he's doing well at this time.  Patient reports that he's having bowel movements without difficulty.  Patient still has gauze over incision.  Patient advised he can remove gauze and clean area and reapply clean dry gauze.  Patient does not have any tape, will keep original gauze over incision.  Patient advised of post op appointment on 12/01/12 @ 4:40pm w/Dr. Derrell Lolling.  Patient advised to call our office if he has any questions or concerns.  Patient verbalized understanding.

## 2012-11-23 NOTE — Telephone Encounter (Signed)
Message copied by Maryan Puls on Mon Nov 23, 2012  9:15 AM ------      Message from: Axel Filler      Created: Mon Nov 23, 2012  7:05 AM       Can you give her a call today and see if she's doing OK/having BMs?            Thanks      AR       ------

## 2012-11-26 ENCOUNTER — Telehealth: Payer: Self-pay

## 2012-11-26 ENCOUNTER — Telehealth (INDEPENDENT_AMBULATORY_CARE_PROVIDER_SITE_OTHER): Payer: Self-pay

## 2012-11-26 ENCOUNTER — Encounter (INDEPENDENT_AMBULATORY_CARE_PROVIDER_SITE_OTHER): Payer: Self-pay | Admitting: General Surgery

## 2012-11-26 ENCOUNTER — Telehealth (INDEPENDENT_AMBULATORY_CARE_PROVIDER_SITE_OTHER): Payer: Self-pay | Admitting: General Surgery

## 2012-11-26 NOTE — Telephone Encounter (Signed)
Pt calling wanting a refill on the pain medication Oxycodone from surgery on 11/16/12. Please call pt about refill.

## 2012-11-26 NOTE — Telephone Encounter (Signed)
Called patient to let him know that he has a Rx for Percocet 5/325 1-2 po q 4 hrs prn for pain #20 with out refills.

## 2012-11-26 NOTE — Telephone Encounter (Signed)
labetalol (NORMODYNE) 200 MG tablet Wants Dr. Armond Hang to refill his medication.   [phone]

## 2012-12-01 ENCOUNTER — Ambulatory Visit (INDEPENDENT_AMBULATORY_CARE_PROVIDER_SITE_OTHER): Payer: Medicare Other | Admitting: General Surgery

## 2012-12-01 ENCOUNTER — Encounter (INDEPENDENT_AMBULATORY_CARE_PROVIDER_SITE_OTHER): Payer: Self-pay | Admitting: General Surgery

## 2012-12-01 VITALS — BP 132/74 | HR 84 | Resp 16 | Ht 65.0 in | Wt 139.8 lb

## 2012-12-01 DIAGNOSIS — Z9889 Other specified postprocedural states: Secondary | ICD-10-CM

## 2012-12-01 NOTE — Progress Notes (Signed)
Patient ID: Alexander [name], male   DOB: 1945-04-18, 67 y.o.   MRN: [id number] Post op course The patient is a 67 year old male status post laparoscopic right inguinal hernia repair with mesh. The patient has been doing well postoperatively aside from edematous right inguinal area. Patient is taken to my repair for which as helped.  On Exam: His wounds are clean dry and intact there is no hernia palpation  Assessment and Plan 67 year old male status post laparotomy hernia repair with mesh 1. We discussed no heavy lifting for one month 2. The patient in followup as needed   Axel Filler, MD [facility] General & Minimally Invasive Surgery Trauma & Emergency Surgery

## 2012-12-09 ENCOUNTER — Telehealth: Payer: Self-pay | Admitting: Diagnostic Neuroimaging

## 2012-12-10 NOTE — Telephone Encounter (Signed)
1 year Rx of gabapentin was already sent to [facility] on 09/30/2012.   gabapentin (NEURONTIN) 300 MG capsule 90 capsule 11 09/30/2012     Sig - Route: Take 1 capsule (300 mg total) by mouth 3 (three) times daily. - Oral    E-Prescribing Status: Receipt confirmed by pharmacy (09/30/2012 9:19 AM EDT)                Pharmacy    [id number] - [place], [place] - [address]     According to chart, patient got a Rx for pain meds on 10/27: Order Providers    Prescribing Provider Encounter Provider   Axel Filler, MD Consuelo Pandy, RN         Medication Detail      Disp Refills Start End     oxyCODONE-acetaminophen (ROXICET) 5-325 MG per tablet 30 tablet 0 11/16/2012 11/16/2013    Sig - Route: Take 1-2 tablets by mouth every 4 (four) hours as needed for pain (Given at discharge from [facility]). - Oral    I called the patient.  Got no answer.  Left message saying there are refills on his medication on file at the pharmacy and recommended he request refills on other med from provider whom last prescribed it.

## 2012-12-30 ENCOUNTER — Encounter: Payer: Self-pay | Admitting: Nurse Practitioner

## 2012-12-30 ENCOUNTER — Ambulatory Visit (INDEPENDENT_AMBULATORY_CARE_PROVIDER_SITE_OTHER): Payer: Medicare Other | Admitting: Nurse Practitioner

## 2012-12-30 VITALS — BP 157/95 | HR 72 | Temp 97.4°F | Ht 65.5 in | Wt 143.0 lb

## 2012-12-30 DIAGNOSIS — IMO0002 Reserved for concepts with insufficient information to code with codable children: Secondary | ICD-10-CM

## 2012-12-30 DIAGNOSIS — M5416 Radiculopathy, lumbar region: Secondary | ICD-10-CM

## 2012-12-30 MED ORDER — GABAPENTIN 300 MG PO CAPS: 600.0000 mg | ORAL_CAPSULE | Freq: Three times a day (TID) | ORAL | Status: DC

## 2012-12-30 NOTE — Patient Instructions (Signed)
Continue Gabapentin 600 mg three times a day as needed.  We are Referring you to physical Therapy for an evaluation and suggestions on exercises to help your condition.  Follow up in 6 months, sooner as needed.

## 2012-12-30 NOTE — Progress Notes (Signed)
PATIENT: Alexander [name] DOB: 11/22/1945   REASON FOR VISIT: follow up for back pain HISTORY FROM: patient  HISTORY OF PRESENT ILLNESS: 67 year old right-handed male with history of hypertension, here for evaluation of low back pain radiating to left leg.  Patient reports long history of back problems since age 81's. At age 67 his back "gave out". He had severe low back pain radiating to left leg. He was taking Celebrex at some point but this caused blood in the stool. He had MRI scan of the lumbar spine in 2000 which showed disc bulging and "spurring". He did not pursue surgical intervention at that time.  Patient was doing fairly well until 2 months ago when he had recurrence of severe left hip, left buttock and left leg burning pain. He feels severe pressure from his left knee down to his left foot. Patient has been treated with course of prednisone with mild relief. He is also on hydrocodone/Tylenol with mild relief. He has not tried physical therapy or epidural steroid injections.   UPDATE 12/30/12 (LL): Alexander [name] returns for follow up for back pain.  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  Severe left and mild to moderate right foraminal narrowing is also present at this level.  Diffuse broad-based disk bulge at L5-S1 results in narrowing of the lateral recesses which could impact either descending S1 root. 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  He states that he has had moderate relief with increasing Gabapentin to 600 mg TID.  The pain is no longer shooting all the way to the foot, stops at the ankle.  Pain is worst in the mornings.  He considers surgery a last resort.  He prefers to take medication and try PT if possible.  REVIEW OF SYSTEMS: Full 14 system review of systems  performed and notable only for low back pain and left leg pain.  ALLERGIES: No Known Allergies  HOME MEDICATIONS: Outpatient Prescriptions Prior to Visit  Medication Sig Dispense Refill  . gabapentin (NEURONTIN) 300 MG capsule Take 1 capsule (300 mg total) by mouth 3 (three) times daily.  90 capsule  11   PAST MEDICAL HISTORY: Past Medical History  Diagnosis Date  . Cancer     prostate  . Hypertension   . Asthma     PAST SURGICAL HISTORY: Past Surgical History  Procedure Laterality Date  . Prostate surgery  2005  . Hernia repair      FAMILY HISTORY: Family History  Problem Relation Age of Onset  . Diabetes Mother   . Depression Father     SOCIAL HISTORY: History   Social History  . Marital Status: Widowed    Spouse Name: N/A    Number of Children: 2  . Years of Education: 9   Occupational History  . retired    Social History Main Topics  . Smoking status: Current Every Day Smoker -- 1.00 packs/day for 51 years    Types: Cigarettes  . Smokeless tobacco: Never Used  . Alcohol Use: No  . Drug Use: No  . Sexual Activity: Yes   Other Topics Concern  . Not on file   Social History Narrative   Patient is single and his daughter lives with him.   Patient has two children.   Patient has a 9th grade  education.   Patient works in [employer] (part-time).   Patient drinks one to two cups of coffee daily.   Patient is right-handed.         PHYSICAL EXAM  Filed Vitals:   12/30/12 0828  BP: 157/95  Pulse: 72  Temp: 97.4 F (36.3 C)  Height: 5' 5.5" (1.664 m)  Weight: 143 lb (64.864 kg)   Body mass index is 23.43 kg/(m^2).  GENERAL EXAM:  Patient is in no distress; STRAIGHT AND CROSSED LEG RAISE NEG. SMELLS OF CIG SMOKE.  CARDIOVASCULAR:  Regular rate and rhythm, no murmurs, no carotid bruits   NEUROLOGIC:  MENTAL STATUS: awake, alert, language fluent, comprehension intact, naming intact  CRANIAL NERVE:  pupils equal and reactive to light, visual  fields full to confrontation, extraocular muscles intact, no nystagmus, facial sensation and strength symmetric, uvula midline, shoulder shrug symmetric, tongue midline.  MOTOR: normal bulk and tone, full strength in the BUE, BLE  SENSORY: normal and symmetric to light touch, pinprick, temperature and proprioception; RIGHT TOE VIB 12 SEC; LEFT TOE VIB 7 SEC.  COORDINATION: finger-nose-finger, fine finger movements normal  REFLEXES: BUE 2, RIGHT KNEE 3+, LEFT KNEE 3-, ANKLES 2. DOWN GOING TOES.  GAIT/STATION: narrow based gait; STOOPED POSTURE. ANTALGIC GAIT. Able to walk on toes, heels and tandem; romberg is negative  DIAGNOSTIC DATA (LABS, IMAGING, TESTING) - I reviewed patient records, labs, notes, testing and imaging myself where available.  MRI LUMBAR SPINE WITHOUT CONTRAST 10/27/12 Spondylosis most notable at L4-5 where there is severe congenital and acquired central canal and lateral recess narrowing. Severe left and mild to moderate right foraminal narrowing is also present at this level.  Diffuse broad-based disk bulge at L5-S1 results in narrowing of the lateral recesses which could impact either descending S1 root. 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 year old male here with low back pain radiating to the left leg. 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    PLAN:  1. Continue Gabapentin 600mg  TID, refills sent. 2. PT eval. Return in about 6 months (around 06/30/2013).  Ronal Fear, MSN, NP-C 12/30/2012, 9:05 AM [facility] [address], [address] [place], [place] [address] [phone]  Note: This document was prepared with digital dictation and possible smart phrase technology. Any transcriptional errors that result from this process are unintentional.

## 2013-01-11 ENCOUNTER — Ambulatory Visit (INDEPENDENT_AMBULATORY_CARE_PROVIDER_SITE_OTHER): Payer: Medicare Other | Admitting: Family Medicine

## 2013-01-11 VITALS — BP 138/80 | HR 62 | Temp 98.4°F | Resp 18 | Ht 64.5 in | Wt 141.8 lb

## 2013-01-11 DIAGNOSIS — I10 Essential (primary) hypertension: Secondary | ICD-10-CM

## 2013-01-11 DIAGNOSIS — M5416 Radiculopathy, lumbar region: Secondary | ICD-10-CM

## 2013-01-11 DIAGNOSIS — IMO0002 Reserved for concepts with insufficient information to code with codable children: Secondary | ICD-10-CM

## 2013-01-11 DIAGNOSIS — M79605 Pain in left leg: Secondary | ICD-10-CM

## 2013-01-11 DIAGNOSIS — J309 Allergic rhinitis, unspecified: Secondary | ICD-10-CM

## 2013-01-11 DIAGNOSIS — M79609 Pain in unspecified limb: Secondary | ICD-10-CM

## 2013-01-11 MED ORDER — GABAPENTIN 300 MG PO CAPS: 900.0000 mg | ORAL_CAPSULE | Freq: Three times a day (TID) | ORAL | Status: DC

## 2013-01-11 MED ORDER — AMLODIPINE BESYLATE 5 MG PO TABS: 5.0000 mg | ORAL_TABLET | Freq: Every day | ORAL | Status: DC

## 2013-01-11 MED ORDER — HYDROCODONE-ACETAMINOPHEN 10-325 MG PO TABS: 1.0000 | ORAL_TABLET | Freq: Three times a day (TID) | ORAL | Status: DC | PRN

## 2013-01-11 MED ORDER — MOMETASONE FUROATE 50 MCG/ACT NA SUSP: 2.0000 | Freq: Every day | NASAL | Status: DC

## 2013-01-11 NOTE — Progress Notes (Signed)
° °  Subjective:    Patient ID: Alexander [name], male    DOB: [birth year], 67 y.o.   MRN: [id number]  This chart was scribed for Elvina Sidle, MD by Blanchard Kelch, ED Scribe. The patient was seen in room 2. Patient's care was started at 7:53 PM.   HPI  Alexander [name] is a 67 y.o. male who presents to office for a medication refill. He states that he has been out of his blood pressure medication for awhile. He is also needing medication for his sinuses. He has continued pain in his lower back that radiates to his left leg every morning that is getting progressively more severe. The pain first began about four months ago. He is taking Gabapentin three times a day for the pain with moderate relief. He had an MRI done, which showed spondylosis in L4-L5, disc bulging L5-S1, moderate congenital and acquired central canal stenosis L3-4 as well as foraminal narrowing at multiple discs. He has had increasing weakness in the left leg. He denies any issues in his right leg.      Review of Systems  Constitutional: Negative for fever.  HENT: Negative for drooling.   Eyes: Negative for discharge.  Respiratory: Negative for cough.   Cardiovascular: Negative for leg swelling.  Gastrointestinal: Negative for vomiting.  Endocrine: Negative for polyuria.  Genitourinary: Negative for hematuria.  Musculoskeletal: Positive for arthralgias and back pain. Negative for gait problem.  Skin: Negative for rash.  Allergic/Immunologic: Negative for immunocompromised state.  Neurological: Positive for weakness. Negative for speech difficulty.  Hematological: Negative for adenopathy.  Psychiatric/Behavioral: Negative for confusion.       Objective:   Physical Exam  Nursing note and vitals reviewed. General: Well-developed, well-nourished male in no acute distress; appearance consistent with age of record HENT: normocephalic; atraumatic Eyes: pupils equal, round and reactive to light; extraocular muscles  intact Neck: supple Heart: regular rate and rhythm; no murmurs, rubs or gallops Lungs: clear to auscultation bilaterally Abdomen: soft; nondistended; nontender; no masses or hepatosplenomegaly; bowel sounds present Extremities: No deformity; full range of motion; pulses normal; loss of muscle mass in left thigh Neurologic: Awake, alert and oriented; motor function intact in all extremities and symmetric; no facial droop Skin: Warm and dry Psychiatric: Normal mood and affect  Able to SLR without pain.      Assessment & Plan:  Left leg pain - Plan: gabapentin (NEURONTIN) 300 MG capsule, HYDROcodone-acetaminophen (NORCO) 10-325 MG per tablet  Hypertension - Plan: amLODipine (NORVASC) 5 MG tablet  Lumbar radiculopathy, chronic - Plan: Ambulatory referral to Neurosurgery  Allergic rhinitis - Plan: mometasone (NASONEX) 50 MCG/ACT nasal spray  Signed, Elvina Sidle, MD   I personally performed the services described in this documentation, which was scribed in my presence. The recorded information has been reviewed and is accurate.

## 2013-01-11 NOTE — Patient Instructions (Signed)

## 2013-01-18 ENCOUNTER — Telehealth: Payer: Self-pay

## 2013-01-18 MED ORDER — FLUTICASONE PROPIONATE 50 MCG/ACT NA SUSP: 2.0000 | Freq: Every day | NASAL | Status: DC

## 2013-01-18 NOTE — Telephone Encounter (Signed)
See other message

## 2013-01-18 NOTE — Telephone Encounter (Signed)
Flonase is generic, should be least expensive. OK to send in in place of Nasonex, with same instructions, quantity and refills. Nasonex is OTC.

## 2013-01-18 NOTE — Telephone Encounter (Signed)
Patient states nasonex is $100 and he would like something cheaper.

## 2013-01-18 NOTE — Telephone Encounter (Signed)
Pt saw Dr. Elbert Ewings and he said the nose spray that was called in was too expensive.  Can we call in something else.  [phone]

## 2013-01-18 NOTE — Telephone Encounter (Signed)
Sent, called him ot advise.

## 2013-01-27 DIAGNOSIS — M48061 Spinal stenosis, lumbar region without neurogenic claudication: Secondary | ICD-10-CM | POA: Insufficient documentation

## 2013-02-08 ENCOUNTER — Telehealth: Payer: Self-pay

## 2013-02-08 DIAGNOSIS — M79605 Pain in left leg: Secondary | ICD-10-CM

## 2013-02-08 MED ORDER — HYDROCODONE-ACETAMINOPHEN 10-325 MG PO TABS: 1.0000 | ORAL_TABLET | Freq: Three times a day (TID) | ORAL | Status: DC | PRN

## 2013-02-08 NOTE — Telephone Encounter (Signed)
Pt requesting pain meds called to pharmacy   [facility]   Pt phone [phone]

## 2013-02-08 NOTE — Telephone Encounter (Signed)
Did patient get neurology or neurosurgery referral? I can reorder the pain medicine that he needs to get any further prescriptions for his sciatica from the neurosurgeon.

## 2013-02-09 NOTE — Telephone Encounter (Signed)
Referral was sent to [place] [facility].  Pt saw neurologist Jan 7th.  He is going to call them to get a refill on his pain medication. He is going to have some injections done and is waiting for them to schedule it.

## 2013-02-11 NOTE — Telephone Encounter (Signed)
Pt reported that he has not been able to get RF yet from his NS. I advised pt that we have 1 mos RF for him ready, but will need to get NS to RF more. Pt agreed.

## 2013-02-24 NOTE — Progress Notes (Deleted)
   Subjective:    Patient ID: Alexander [name], male    DOB: October 01, 1945, 68 y.o.   MRN: [id number] Chief Complaint  Patient presents with  . Follow-up    HPI 68 y.o. Male presents to clinic for follow up of left leg pain . Denies any trouble with leg feeling as if it is going to give out. Has not had any swelling or soreness to touch. Problem has been going on for over a month. Had trouble with this years ago.  Patient is a retired Chief Executive Officer.  Was prescribed prednisone but states that pain didn't really let up . Notices in the morning upon waking up he has a burning sensation in the back of the leg. Last about all day long.      Review of Systems    BP 134/76  Pulse 86  Temp(Src) 98.5 F (36.9 C) (Oral)  Resp 17  Ht 5' 4.5" (1.638 m)  Wt 138 lb (62.596 kg)  BMI 23.33 kg/m2  SpO2 96% Objective:   Physical Exam        Assessment & Plan:  No diagnosis found.  No orders of the defined types were placed in this encounter.    I personally performed the services described in this documentation, which was scribed in my presence. The recorded information has been reviewed and considered, and addended by me as needed.  [name], MD MPH

## 2013-02-24 NOTE — Progress Notes (Signed)
   Subjective:    Patient ID: Alexander [name], male    DOB: Dec 06, 1945, 68 y.o.   MRN: [id number]  HPI erroneus encounter. Pt left w/o eval    Review of Systems     Objective:   Physical Exam BP 134/76  Pulse 86  Temp(Src) 98.5 F (36.9 C) (Oral)  Resp 17  Ht 5' 4.5" (1.638 m)  Wt 138 lb (62.596 kg)  BMI 23.33 kg/m2  SpO2 96%        Assessment & Plan:   This encounter was created in error - please disregard.

## 2013-03-20 ENCOUNTER — Ambulatory Visit (INDEPENDENT_AMBULATORY_CARE_PROVIDER_SITE_OTHER): Payer: Medicare Other | Admitting: Family Medicine

## 2013-03-20 ENCOUNTER — Telehealth: Payer: Self-pay

## 2013-03-20 VITALS — BP 130/78 | HR 94 | Temp 97.9°F | Resp 16 | Ht 65.0 in | Wt 143.0 lb

## 2013-03-20 DIAGNOSIS — J209 Acute bronchitis, unspecified: Secondary | ICD-10-CM

## 2013-03-20 DIAGNOSIS — M79609 Pain in unspecified limb: Secondary | ICD-10-CM

## 2013-03-20 DIAGNOSIS — M79605 Pain in left leg: Secondary | ICD-10-CM

## 2013-03-20 DIAGNOSIS — J441 Chronic obstructive pulmonary disease with (acute) exacerbation: Secondary | ICD-10-CM

## 2013-03-20 MED ORDER — AZITHROMYCIN 250 MG PO TABS: ORAL_TABLET | ORAL | Status: DC

## 2013-03-20 MED ORDER — HYDROCODONE-ACETAMINOPHEN 10-325 MG PO TABS: 1.0000 | ORAL_TABLET | Freq: Three times a day (TID) | ORAL | Status: DC | PRN

## 2013-03-20 NOTE — Telephone Encounter (Signed)
Patient was wanting to ask Dr. Carlean Jews if he can call in something for his cold. I told patient he needs an office visit he says he was seen last year. Please advise, patient still wanted me to ask Dr. Linna Darner. He says he might come in to be seen today but has not shown, he is aware of Dr. Lenn Cal hours today.   Best: [phone]

## 2013-03-20 NOTE — Progress Notes (Signed)
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  He is considering further intervention for this.  He has had one day of fever, chills and cough.  Nonproductive cough.  Smoker  Objective:  NAD HEENT: unremarkable Chest:  Rales right lower lung field, otherwise clear Heart:  Reg, no murmur  Assessment: COPD with exacerbation, chronic back and leg pain  Acute bronchitis - Plan: azithromycin (ZITHROMAX Z-PAK) 250 MG tablet  COPD exacerbation - Plan: azithromycin (ZITHROMAX Z-PAK) 250 MG tablet  Left leg pain - Plan: HYDROcodone-acetaminophen (NORCO) 10-325 MG per tablet  Signed, Robyn Haber, MD

## 2013-03-21 NOTE — Telephone Encounter (Signed)
Pt came in on 03/20/13 to see Dr. Carlean Jews

## 2013-03-21 NOTE — Telephone Encounter (Signed)
Since this is a new illness, I need to see the patient to make sure of appropriate treatment.  He can try OTC's if he is in no acute distress

## 2013-05-06 ENCOUNTER — Other Ambulatory Visit: Payer: Self-pay | Admitting: Neurosurgery

## 2013-05-18 ENCOUNTER — Ambulatory Visit (INDEPENDENT_AMBULATORY_CARE_PROVIDER_SITE_OTHER): Payer: Medicare Other | Admitting: Family Medicine

## 2013-05-18 VITALS — BP 132/84 | HR 64 | Temp 98.6°F | Resp 16 | Ht 64.0 in | Wt 138.0 lb

## 2013-05-18 DIAGNOSIS — M545 Low back pain, unspecified: Secondary | ICD-10-CM

## 2013-05-18 DIAGNOSIS — R110 Nausea: Secondary | ICD-10-CM

## 2013-05-18 DIAGNOSIS — R251 Tremor, unspecified: Secondary | ICD-10-CM

## 2013-05-18 DIAGNOSIS — I10 Essential (primary) hypertension: Secondary | ICD-10-CM

## 2013-05-18 DIAGNOSIS — M79605 Pain in left leg: Secondary | ICD-10-CM

## 2013-05-18 DIAGNOSIS — E119 Type 2 diabetes mellitus without complications: Secondary | ICD-10-CM

## 2013-05-18 DIAGNOSIS — R259 Unspecified abnormal involuntary movements: Secondary | ICD-10-CM

## 2013-05-18 DIAGNOSIS — R61 Generalized hyperhidrosis: Secondary | ICD-10-CM

## 2013-05-18 LAB — POCT CBC
Granulocyte percent: 50.8 %G (ref 37–80)
HCT, POC: 49.6 % (ref 43.5–53.7)
HEMOGLOBIN: 16 g/dL (ref 14.1–18.1)
Lymph, poc: 1.8 (ref 0.6–3.4)
MCH, POC: 29.8 pg (ref 27–31.2)
MCHC: 32.8 g/dL (ref 31.8–35.4)
MCV: 92.3 fL (ref 80–97)
MID (cbc): 0.5 (ref 0–0.9)
MPV: 10 fL (ref 0–99.8)
PLATELET COUNT, POC: 188 10*3/uL (ref 142–424)
POC Granulocyte: 2.4 (ref 2–6.9)
POC LYMPH PERCENT: 38.8 %L (ref 10–50)
POC MID %: 10.4 % (ref 0–12)
RBC: 5.37 M/uL (ref 4.69–6.13)
RDW, POC: 15.3 %
WBC: 4.7 10*3/uL (ref 4.6–10.2)

## 2013-05-18 LAB — LIPID PANEL
CHOLESTEROL: 152 mg/dL (ref 0–200)
HDL: 31 mg/dL — ABNORMAL LOW (ref >39)
LDL Cholesterol: 97 mg/dL (ref 0–99)
TRIGLYCERIDES: 118 mg/dL (ref <150)
Total CHOL/HDL Ratio: 4.9 Ratio
VLDL: 24 mg/dL (ref 0–40)

## 2013-05-18 LAB — COMPREHENSIVE METABOLIC PANEL
ALBUMIN: 4.2 g/dL (ref 3.5–5.2)
ALK PHOS: 130 U/L — AB (ref 39–117)
ALT: 23 U/L (ref 0–53)
AST: 27 U/L (ref 0–37)
BUN: 11 mg/dL (ref 6–23)
CO2: 28 mEq/L (ref 19–32)
Calcium: 9.7 mg/dL (ref 8.4–10.5)
Chloride: 97 mEq/L (ref 96–112)
Creat: 1 mg/dL (ref 0.50–1.35)
Glucose, Bld: 99 mg/dL (ref 70–99)
POTASSIUM: 4.7 meq/L (ref 3.5–5.3)
Sodium: 133 mEq/L — ABNORMAL LOW (ref 135–145)
TOTAL PROTEIN: 7.6 g/dL (ref 6.0–8.3)
Total Bilirubin: 0.6 mg/dL (ref 0.2–1.2)

## 2013-05-18 LAB — TSH: TSH: 1.339 u[IU]/mL (ref 0.350–4.500)

## 2013-05-18 LAB — POCT GLYCOSYLATED HEMOGLOBIN (HGB A1C): Hemoglobin A1C: 6.8

## 2013-05-18 LAB — POCT SEDIMENTATION RATE: POCT SED RATE: 58 mm/hr — AB (ref 0–22)

## 2013-05-18 MED ORDER — HYDROCODONE-ACETAMINOPHEN 10-325 MG PO TABS: 1.0000 | ORAL_TABLET | Freq: Three times a day (TID) | ORAL | Status: DC | PRN

## 2013-05-18 MED ORDER — FLUTICASONE PROPIONATE 50 MCG/ACT NA SUSP: 2.0000 | Freq: Every day | NASAL | Status: DC

## 2013-05-18 NOTE — Patient Instructions (Signed)
Diabetes Meal Planning Guide The diabetes meal planning guide is a tool to help you plan your meals and snacks. It is important for people with diabetes to manage their blood glucose (sugar) levels. Choosing the right foods and the right amounts throughout your day will help control your blood glucose. Eating right can even help you improve your blood pressure and reach or maintain a healthy weight. CARBOHYDRATE COUNTING MADE EASY When you eat carbohydrates, they turn to sugar. This raises your blood glucose level. Counting carbohydrates can help you control this level so you feel better. When you plan your meals by counting carbohydrates, you can have more flexibility in what you eat and balance your medicine with your food intake. Carbohydrate counting simply means adding up the total amount of carbohydrate grams in your meals and snacks. Try to eat about the same amount at each meal. Foods with carbohydrates are listed below. Each portion below is 1 carbohydrate serving or 15 grams of carbohydrates. Ask your dietician how many grams of carbohydrates you should eat at each meal or snack. Grains and Starches  1 slice bread.   English muffin or hotdog/hamburger bun.   cup cold cereal (unsweetened).   cup cooked pasta or rice.   cup starchy vegetables (corn, potatoes, peas, beans, winter squash).  1 tortilla (6 inches).   bagel.  1 waffle or pancake (size of a CD).   cup cooked cereal.  4 to 6 small crackers. *Whole grain is recommended. Fruit  1 cup fresh unsweetened berries, melon, papaya, pineapple.  1 small fresh fruit.   banana or mango.   cup fruit juice (4 oz unsweetened).   cup canned fruit in natural juice or water.  2 tbs dried fruit.  12 to 15 grapes or cherries. Milk and Yogurt  1 cup fat-free or 1% milk.  1 cup soy milk.  6 oz light yogurt with sugar-free sweetener.  6 oz low-fat soy yogurt.  6 oz plain yogurt. Vegetables  1 cup raw or  cup  cooked is counted as 0 carbohydrates or a "free" food.  If you eat 3 or more servings at 1 meal, count them as 1 carbohydrate serving. Other Carbohydrates   oz chips or pretzels.   cup ice cream or frozen yogurt.   cup sherbet or sorbet.  2 inch square cake, no frosting.  1 tbs honey, sugar, jam, jelly, or syrup.  2 small cookies.  3 squares of graham crackers.  3 cups popcorn.  6 crackers.  1 cup broth-based soup.  Count 1 cup casserole or other mixed foods as 2 carbohydrate servings.  Foods with less than 20 calories in a serving may be counted as 0 carbohydrates or a "free" food. You may want to purchase a book or computer software that lists the carbohydrate gram counts of different foods. In addition, the nutrition facts panel on the labels of the foods you eat are a good source of this information. The label will tell you how big the serving size is and the total number of carbohydrate grams you will be eating per serving. Divide this number by 15 to obtain the number of carbohydrate servings in a portion. Remember, 1 carbohydrate serving equals 15 grams of carbohydrate. SERVING SIZES Measuring foods and serving sizes helps you make sure you are getting the right amount of food. The list below tells how big or small some common serving sizes are.  1 oz.........4 stacked dice.  3 oz........Marland KitchenDeck of cards.  1 tsp.......Marland KitchenTip  of little finger.  1 tbs......Marland KitchenMarland KitchenThumb.  2 tbs.......Marland KitchenGolf ball.   cup......Marland KitchenHalf of a fist.  1 cup.......Marland KitchenA fist. SAMPLE DIABETES MEAL PLAN Below is a sample meal plan that includes foods from the grain and starches, dairy, vegetable, fruit, and meat groups. A dietician can individualize a meal plan to fit your calorie needs and tell you the number of servings needed from each food group. However, controlling the total amount of carbohydrates in your meal or snack is more important than making sure you include all of the food groups at every  meal. You may interchange carbohydrate containing foods (dairy, starches, and fruits). The meal plan below is an example of a 2000 calorie diet using carbohydrate counting. This meal plan has 17 carbohydrate servings. Breakfast  1 cup oatmeal (2 carb servings).   cup light yogurt (1 carb serving).  1 cup blueberries (1 carb serving).   cup almonds. Snack  1 large apple (2 carb servings).  1 low-fat string cheese stick. Lunch  Chicken breast salad.  1 cup spinach.   cup chopped tomatoes.  2 oz chicken breast, sliced.  2 tbs low-fat [place] dressing.  12 whole-wheat crackers (2 carb servings).  12 to 15 grapes (1 carb serving).  1 cup low-fat milk (1 carb serving). Snack  1 cup carrots.   cup hummus (1 carb serving). Dinner  3 oz broiled salmon.  1 cup brown rice (3 carb servings). Snack  1  cups steamed broccoli (1 carb serving) drizzled with 1 tsp olive oil and lemon juice.  1 cup light pudding (2 carb servings). DIABETES MEAL PLANNING WORKSHEET Your dietician can use this worksheet to help you decide how many servings of foods and what types of foods are right for you.  BREAKFAST Food Group and Servings / Carb Servings Grain/Starches __________________________________ Dairy __________________________________________ Vegetable ______________________________________ Fruit ___________________________________________ Meat __________________________________________ Fat ____________________________________________ LUNCH Food Group and Servings / Carb Servings Grain/Starches ___________________________________ Dairy ___________________________________________ Fruit ____________________________________________ Meat ___________________________________________ Fat _____________________________________________ Wonda Cheng Food Group and Servings / Carb Servings Grain/Starches ___________________________________ Dairy  ___________________________________________ Fruit ____________________________________________ Meat ___________________________________________ Fat _____________________________________________ SNACKS Food Group and Servings / Carb Servings Grain/Starches ___________________________________ Dairy ___________________________________________ Vegetable _______________________________________ Fruit ____________________________________________ Meat ___________________________________________ Fat _____________________________________________ DAILY TOTALS Starches _________________________ Vegetable ________________________ Fruit ____________________________ Dairy ____________________________ Meat ____________________________ Fat ______________________________ Document Released: 10/04/2004 Document Revised: 04/01/2011 Document Reviewed: 08/15/2008 [employer] Patient Information 2014 [place]. Diabetes, Eating Away From Home Sometimes, you might eat in a restaurant or have meals that are prepared by someone else. You can enjoy eating out. However, the portions in restaurants may be much larger than needed. Listed below are some ideas to help you choose foods that will keep your blood glucose (sugar) in better control.  TIPS FOR EATING OUT  Know your meal plan and how many carbohydrate servings you should have at each meal. You may wish to carry a copy of your meal plan in your purse or wallet. Learn the foods included in each food group.  Make a list of restaurants near you that offer healthy choices. Take a copy of the carry-out menus to see what they offer. Then, you can plan what you will order ahead of time.  Become familiar with serving sizes by practicing them at home using measuring cups and spoons. Once you learn to recognize portion sizes, you will be able to correctly estimate the amount of total carbohydrate you are allowed to eat at the restaurant. Ask for a takeout box if the  portion is more than you  should have. When your food comes, leave the amount you should have on the plate, and put the rest in the takeout box before you start eating.  Plan ahead if your mealtime will be different from usual. Check with your caregiver to find out how to time meals and medicine if you are taking insulin.  Avoid high-fat foods, such as fried foods, cream sauces, high-fat salad dressings, or any added butter or margarine.  Do not be afraid to ask questions. Ask your server about the portion size, cooking methods, ingredients and if items can be substituted. Restaurants do not list all available items on the menu. You can ask for your main entree to be prepared using skim milk, oil instead of butter or margarine, and without gravy or sauces. Ask your waiter or waitress to serve salad dressings, gravy, sauces, margarine, and sour cream on the side. You can then add the amount your meal plan suggests.  Add more vegetables whenever possible.  Avoid items that are labeled "jumbo," "giant," "deluxe," or "supersized."  You may want to split an entre with someone and order an extra side salad.  Watch for hidden calories in foods like croutons, bacon, or cheese.  Ask your server to take away the bread basket or chips from your table.  Order a dinner salad as an appetizer. You can eat most foods served in a restaurant. Some foods are better choices than others. Breads and Starches  Recommended: All kinds of bread (wheat, rye, white, oatmeal, [place], [place], raisin), hard or soft dinner rolls, frankfurter or hamburger buns, small bagels, small corn or whole-wheat flour tortillas.  Avoid: Frosted or glazed breads, butter rolls, egg or cheese breads, croissants, sweet rolls, pastries, coffee cake, glazed or frosted doughnuts, muffins. Crackers  Recommended: Animal crackers, graham, rye, saltine, oyster, and matzoth crackers. Bread sticks, melba toast, rusks, pretzels, popcorn (without  fat), zwieback toast.  Avoid: High-fat snack crackers or chips. Buttered popcorn. Cereals  Recommended: Hot and cold cereals. Whole grains such as oatmeal or shredded wheat are good choices.  Avoid: Sugar-coated or granola type cereals. Potatoes/Pasta/Rice/Beans  Recommended: Order baked, boiled, or mashed potatoes, rice or noodles without added fat, whole beans. Order gravies, butter, margarine, or sauces on the side so you can control the amount you add.  Avoid: Hash browns or fried potatoes. Potatoes, pasta, or rice prepared with cream or cheese sauce. Potato or pasta salads prepared with large amounts of dressing. Fried beans or fried rice. Vegetables  Recommended: Order steamed, baked, boiled, or stewed vegetables without sauces or extra fat. Ask that sauce be served on the side. If vegetables are not listed on the menu, ask what is available.  Avoid: Vegetables prepared with cream, butter, or cheese sauce. Fried vegetables. Salad Bars  Recommended: Many of the vegetables at a salad bar are considered "free." Use lemon juice, vinegar, or low-calorie salad dressing (fewer than 20 calories per serving) as "free" dressings for your salad. Look for salad bar ingredients that have no added fat or sugar such as tomatoes, lettuce, cucumbers, broccoli, carrots, onions, and mushrooms.  Avoid: Prepared salads with large amounts of dressing, such as coleslaw, caesar salad, macaroni salad, bean salad, or carrot salad. Fruit  Recommended: Eat fresh fruit or fresh fruit salad without added dressing. A salad bar often offers fresh fruit choices, but canned fruit at a restaurant is usually packed in sugar or syrup.  Avoid: Sweetened canned or frozen fruits, plain or sweetened fruit juice. Fruit salads with dressing,  sour cream, or sugar added to them. Meat and Meat Substitutes  Recommended: Order broiled, baked, roasted, or grilled meat, poultry, or fish. Trim off all visible fat. Do not eat the  skin of poultry. The size stated on the menu is the raw weight. Meat shrinks by  in cooking (for example, 4 oz raw equals 3 oz cooked meat).  Avoid: Deep-fat fried meat, poultry, or fish. Breaded meats. Eggs  Recommended: Order soft, hard-cooked, poached, or scrambled eggs. Omelets may be okay, depending on what ingredients are added. Egg substitutes are also a good choice.  Avoid: Fried eggs, eggs prepared with cream or cheese sauce. Milk  Recommended: Order low-fat or fat-free milk according to your meal plan. Plain, nonfat yogurt or flavored yogurt with no sugar added may be used as a substitute for milk. Soy milk may also be used.  Avoid: Milk shakes or sweetened milk beverages. Soups and Combination Foods  Recommended: Clear broth or consomm are "free" foods and may be used as an appetizer. Broth-based soups with fat removed count as a starch serving and are preferred over cream soups. Soups made with beans or split peas may be eaten but count as a starch.  Avoid: Fatty soups, soup made with cream, cheese soup. Combination foods prepared with excessive amounts of fat or with cream or cheese sauces. Desserts and Sweets  Recommended: Ask for fresh fruit. Sponge or angel food cake without icing, ice milk, no sugar added ice cream, sherbet, or frozen yogurt may fit into your meal plan occasionally.  Avoid: Pastries, puddings, pies, cakes with icing, custard, gelatin desserts. Fats and Oils  Recommended: Choose healthy fats such as olive oil, canola oil, or tub margarine, reduced fat or fat-free sour cream, cream cheese, avocado, or nuts.  Avoid: Any fats in excess of your allowed portion. Deep-fried foods or any food with a large amount of fat. Note: Ask for all fats to be served on the side, and limit your portion sizes according to your meal plan. Document Released: 01/07/2005 Document Revised: 04/01/2011 Document Reviewed: 07/28/2008 [facility] Patient Information 2014 [place],  [place]. Diabetes and Standards of Medical Care  Diabetes is complicated. You may find that your diabetes team includes a dietitian, nurse, diabetes educator, eye doctor, and more. To help everyone know what is going on and to help you get the care you deserve, the following schedule of care was developed to help keep you on track. Below are the tests, exams, vaccines, medicines, education, and plans you will need. HbA1c test This test shows how well you have controlled your glucose over the past 2 3 months. It is used to see if your diabetes management plan needs to be adjusted.   It is performed at least 2 times a year if you are meeting treatment goals.  It is performed 4 times a year if therapy has changed or if you are not meeting treatment goals. Blood pressure test  This test is performed at every routine medical visit. The goal is less than 140/90 mmHg for most people, but 130/80 mmHg in some cases. Ask your health care provider about your goal. Dental exam  Follow up with the dentist regularly. Eye exam  If you are diagnosed with type 1 diabetes as a child, get an exam upon reaching the age of 74 years or older and have had diabetes for 3 5 years. Yearly eye exams are recommended after that initial eye exam.  If you are diagnosed with type 1 diabetes as  an adult, get an exam within 5 years of diagnosis and then yearly.  If you are diagnosed with type 2 diabetes, get an exam as soon as possible after the diagnosis and then yearly. Foot care exam  Visual foot exams are performed at every routine medical visit. The exams check for cuts, injuries, or other problems with the feet.  A comprehensive foot exam should be done yearly. This includes visual inspection as well as assessing foot pulses and testing for loss of sensation.  Check your feet nightly for cuts, injuries, or other problems with your feet. Tell your health care provider if anything is not healing. Kidney function test  (urine microalbumin)  This test is performed once a year.  Type 1 diabetes: The first test is performed 5 years after diagnosis.  Type 2 diabetes: The first test is performed at the time of diagnosis.  A serum creatinine and estimated glomerular filtration rate (eGFR) test is done once a year to assess the level of chronic kidney disease (CKD), if present. Lipid profile (cholesterol, HDL, LDL, triglycerides)  Performed every 5 years for most people.  The goal for LDL is less than 100 mg/dL. If you are at high risk, the goal is less than 70 mg/dL.  The goal for HDL is 40 mg/dL 50 mg/dL for men and 50 mg/dL 60 mg/dL for women. An HDL cholesterol of 60 mg/dL or higher gives some protection against heart disease.  The goal for triglycerides is less than 150 mg/dL. Influenza vaccine, pneumococcal vaccine, and hepatitis B vaccine  The influenza vaccine is recommended yearly.  The pneumococcal vaccine is generally given once in a lifetime. However, there are some instances when another vaccination is recommended. Check with your health care provider.  The hepatitis B vaccine is also recommended for adults with diabetes. Diabetes self-management education  Education is recommended at diagnosis and ongoing as needed. Treatment plan  Your treatment plan is reviewed at every medical visit. Document Released: 11/04/2008 Document Revised: 09/09/2012 Document Reviewed: 06/09/2012 [facility] Patient Information 2014 [place]. Diabetes, Type 2, Am I At Risk? Diabetes is a lasting (chronic) disease. In type 2 diabetes, the pancreas does not make enough insulin, and the body does not respond normally to the insulin that is made. This type of diabetes was also previously called adult onset diabetes. About 90% of all those who have diabetes have type 2. It usually occurs after the age of 71, but can occur at any age.  People develop type 2 diabetes because they do not use insulin properly.  Eventually, the pancreas cannot make enough insulin for the body's needs. Over time, the amount of glucose (sugar) in the blood increases. RISK FACTORS  Overweight  the more weight you have, the more resistant your cells become to insulin.  Family history  you are more likely to get diabetes if a parent or sibling has diabetes.  Race certain races get diabetes more.  African Americans.  American Indians.  Asian Americans.  Hispanics.  Pacific Islander.  Inactive exercise helps control weight and helps your cells be more sensitive to insulin.  Gestational diabetes  some women develop diabetes while they are pregnant. This goes away when they deliver. However, they are 50-60% more likely to develop type 2 diabetes at a later time.  Having a baby over 9 pounds  a sign that you may have had gestational diabetes.  Age the risk of diabetes goes up as you get older, especially after age 50.  High blood pressure (hypertension). SYMPTOMS Many people have no signs or symptoms. Symptoms can be so mild that you might not even notice them. Some of these signs are:  Increased thirst.  Increased hunger.  Tiredness (fatigue).  Increased urination, especially at night.  Weight loss.  Blurred vision.  Sores that do not heal. WHO SHOULD BE TESTED?  Anyone 48 years or older, especially if overweight, should consider getting tested.  If you are younger than 49, overweight, and have one or more of the risk factors, you should consider getting tested. DIAGNOSIS  Fasting blood glucose (FBS). Usually, 2 are done.  FBS 101-125 mg/dl is considered pre-diabetes.  FBS 126 mg/dl or greater is considered diabetes.  2 hour Oral Glucose Tolerance Test (OGTT). This test is preformed by first having you not eat or drink for several hours. You are then given something sweet to drink and your blood glucose is measured fasting, at one hour and 2 hours. This test tells how well you are able to  handle sugars or carbohydrates.  Fasting: 60-100 mg/dl.  1 hour: less than 200 mg/dl.  2 hours: less than 140 mg/dl.  A1c A1c is a blood glucose test that gives and average of your blood glucose over 3 months. It is the accepted method to use to diagnose diabetes.  A1c 5.7-6.4% is considered pre-diabetes.  A1c 6.5% or greater is considered diabetes. WHAT DOES IT MEAN TO HAVE PRE-DIABETES? Pre-diabetes means you are at risk for getting type 2 diabetes. Your blood glucose is higher than normal, but not yet high enough to diagnose diabetes. The good news is, if you have pre-diabetes you can reduce the risk of getting diabetes and even return to normal blood glucose levels. With modest weight loss and moderate physical activity, you can delay or prevent type 2 diabetes.  PREVENTION You cannot do anything about race, age or family history, but you can lower your chances of getting diabetes. You can:   Exercise regularly and be active.  Reduce fat and calorie intake.  Make wise food choices as much as you can.  Reduce your intake of salt and alcohol.  Maintain a reasonable weight.  Keep blood pressure in an acceptable range. Take medication if needed.  Not smoke.  Maintain an acceptable cholesterol level (HDL, LDL, Triglycerides). Take medication if needed. DOING MY PART: GETTING STARTED Making big changes in your life is hard, especially if you are faced with more than one change. You can make it easier by taking these steps:  Make a plan to change behavior.  Decide exactly what you will do and when you will do it.  Plan what you need to get ready.  Think about what might prevent you from reaching your goals.  Find family and friends who will support and encourage you.  Decide how you will reward yourself when you do what you have planned.  Your doctor, dietitian, or counselor can help you make a plan. HERE ARE SOME OF THE AREAS YOU MAY WISH TO CHANGE TO REDUCE YOUR RISK  OF DIABETES. If you are overweight or obese, choose sensible ways to get in shape. Even small amounts of weight loss, like 5-10 pounds, can help reduce the effects of insulin resistance and help blood glucose control. Diet  Avoid crash diets. Instead, eat less of the foods you usually have. Limit the amount of fat you eat.  Increase your physical activity. Aim for at least 30 minutes of exercise most days of the week.  Set a reasonable weight-loss goal, such as losing 1 pound a week. Aim for a long-term goal of losing 5-7% of your total body weight.  Make wise food choices most of the time.  What you eat has a big impact on your health. By making wise food choices, you can help control your body weight, blood pressure, and cholesterol.  Take a hard look at the serving sizes of the foods you eat. Reduce serving sizes of meat, desserts, and foods high in fat. Increase your intake of fruits and vegetables.  Limit your fat intake to about 25% of your total calories. For example, if your food choices add up to about 2,000 calories a day, try to eat no more than 56 grams of fat. Your caregiver or a dietitian can help you figure out how much fat to have. You can check food labels for fat content too.  You may also want to reduce the number of calories you have each day.  Keep a food log. Write down what you eat, how much you eat, and anything else that helps keep you on track.  When you meet your goal, reward yourself with a nonfood item or activity. Exercise  Be physically active every day.  Keep and exercise log. Write down what exercise you did, for how long, and anything else that keeps you on track.  Regular exercise (like brisk walking) tackles several risk factors at once. It helps you lose weight, it keeps your cholesterol and blood pressure under control, and it helps your body use insulin. People who are physically active for 30 minutes a day, 5 days a week, reduced their risk of type  2 diabetes. If you are not very active, you should start slowly at first. Talk with your caregiver first about what kinds of exercise would be safe for you. Make a plan to increase your activity level with the goal of being active for at least 30 minutes a day, most days of the week.  Choose activities you enjoy. Here are some ways to work extra activity into your daily routine:  Take the stairs rather than an elevator or escalator.  Park at the far end of the lot and walk.  Get off the bus a few stops early and walk the rest of the way.  Walk or bicycle instead of drive whenever you can. Medications Some people need medication to help control their blood pressure or cholesterol levels. If you do, take your medicines as directed. Ask your caregiver whether there are any medicines you can take to prevent type 2 diabetes. Document Released: 01/10/2003 Document Revised: 04/01/2011 Document Reviewed: 10/05/2008 [facility] Patient Information 2014 [place].

## 2013-05-18 NOTE — Progress Notes (Signed)
Subjective:    Patient ID: Alexander [name], male    DOB: Feb 02, 1945, 68 y.o.   MRN: [id number] Chief Complaint  Patient presents with  . Nausea    x3 days    HPI  Has been having nightsweats for the past 3d - new onset.  Has felt some nervousness - wondering if it could be coming from the gabapentin - and poss hydrocodone (ran out of it sev days ago - taking it for chronic back pain rx'ed by PCP Dr. Joseph Art - taking 1/2 tab at a time and will wean off after his sched surg in August).  Has been on the gabapentin same dose for a while.  Has felt some chills and some rhinitis.  May have lost some weight but not sure - clothes fit the same.  Normal appetite and amount.  Recent onset of some constipation. Nml urine, no pharyngitis or cough.  No SHoB or CP.  Some nausea but no vomiting - just a little off. occ rare gerd. occ bc powder. Fasting today other than coffee.  No labs in 2 yrs per pt. Colonoscopy 6 mos prior by Dr. Benson Norway showed tubular adenoma.  Doesn't remember recs for f/u.   Has yearly f/u w/ urologist after his prostate surgery for cancer in 2011 - seen last mo - reports blood test and urine test were normal and no sxs.  Lives w/ daughter, no sick contacts.      Past Medical History  Diagnosis Date  . Cancer     prostate  . Hypertension   . Asthma      Current Outpatient Prescriptions on File Prior to Visit  Medication Sig Dispense Refill  . amLODipine (NORVASC) 5 MG tablet Take 1 tablet (5 mg total) by mouth daily.  90 tablet  3  . fluticasone (FLONASE) 50 MCG/ACT nasal spray Place 2 sprays into both nostrils daily.  16 g  6  . gabapentin (NEURONTIN) 300 MG capsule Take 3 capsules (900 mg total) by mouth 3 (three) times daily.  90 capsule  3  . HYDROcodone-acetaminophen (NORCO) 10-325 MG per tablet Take 1 tablet by mouth every 8 (eight) hours as needed.  30 tablet  0  . azithromycin (ZITHROMAX Z-PAK) 250 MG tablet Take as directed on pack  6 tablet  0  . fluticasone  (FLONASE) 50 MCG/ACT nasal spray Place 2 sprays into the nose daily.  1 g  6  . mometasone (NASONEX) 50 MCG/ACT nasal spray Place 2 sprays into the nose daily.  17 g  12   No current facility-administered medications on file prior to visit.   No Known Allergies   Review of Systems  Constitutional: Positive for chills, diaphoresis, activity change, fatigue and unexpected weight change. Negative for fever and appetite change.  HENT: Negative for congestion, rhinorrhea and sinus pressure.   Respiratory: Negative for cough and shortness of breath.   Cardiovascular: Negative for chest pain and palpitations.  Gastrointestinal: Positive for nausea and constipation. Negative for vomiting, abdominal pain, diarrhea, blood in stool, abdominal distention, anal bleeding and rectal pain.  Genitourinary: Negative for dysuria, frequency and decreased urine volume.  Musculoskeletal: Positive for arthralgias and back pain.  Skin: Negative for rash.  Neurological: Positive for tremors, light-headedness and headaches. Negative for dizziness.  Hematological: Negative for adenopathy.  Psychiatric/Behavioral: Negative for sleep disturbance. The patient is nervous/anxious.       BP 132/84  Pulse 64  Temp(Src) 98.6 F (37 C) (Oral)  Resp 16  Ht 5\' 4"  (1.626 m)  Wt 138 lb (62.596 kg)  BMI 23.68 kg/m2  SpO2 96%  Objective:   Physical Exam  Constitutional: He is oriented to person, place, and time. He appears well-developed and well-nourished. No distress.  HENT:  Head: Normocephalic and atraumatic.  Eyes: Conjunctivae are normal. Pupils are equal, round, and reactive to light. No scleral icterus.  Neck: Normal range of motion. Neck supple. No thyromegaly present.  Cardiovascular: Normal rate, regular rhythm, normal heart sounds and intact distal pulses.   Pulmonary/Chest: Effort normal and breath sounds normal. No respiratory distress.  Abdominal: Soft. Normal appearance and bowel sounds are normal. He  exhibits no distension and no mass. There is no tenderness. There is no rebound, no guarding and no CVA tenderness. No hernia.  Musculoskeletal: He exhibits no edema.  Lymphadenopathy:    He has no cervical adenopathy.  Neurological: He is alert and oriented to person, place, and time. He displays no tremor. Gait normal.  Skin: Skin is warm and dry. He is not diaphoretic.  Psychiatric: He has a normal mood and affect. His behavior is normal.   Results for orders placed in visit on 05/18/13  COMPREHENSIVE METABOLIC PANEL      Result Value Ref Range   Sodium 133 (*) 135 - 145 mEq/L   Potassium 4.7  3.5 - 5.3 mEq/L   Chloride 97  96 - 112 mEq/L   CO2 28  19 - 32 mEq/L   Glucose, Bld 99  70 - 99 mg/dL   BUN 11  6 - 23 mg/dL   Creat 1.00  0.50 - 1.35 mg/dL   Total Bilirubin 0.6  0.2 - 1.2 mg/dL   Alkaline Phosphatase 130 (*) 39 - 117 U/L   AST 27  0 - 37 U/L   ALT 23  0 - 53 U/L   Total Protein 7.6  6.0 - 8.3 g/dL   Albumin 4.2  3.5 - 5.2 g/dL   Calcium 9.7  8.4 - 10.5 mg/dL  TSH      Result Value Ref Range   TSH 1.339  0.350 - 4.500 uIU/mL  LIPID PANEL      Result Value Ref Range   Cholesterol 152  0 - 200 mg/dL   Triglycerides 118  <150 mg/dL   HDL 31 (*) >39 mg/dL   Total CHOL/HDL Ratio 4.9     VLDL 24  0 - 40 mg/dL   LDL Cholesterol 97  0 - 99 mg/dL  POCT CBC      Result Value Ref Range   WBC 4.7  4.6 - 10.2 K/uL   Lymph, poc 1.8  0.6 - 3.4   POC LYMPH PERCENT 38.8  10 - 50 %L   MID (cbc) 0.5  0 - 0.9   POC MID % 10.4  0 - 12 %M   POC Granulocyte 2.4  2 - 6.9   Granulocyte percent 50.8  37 - 80 %G   RBC 5.37  4.69 - 6.13 M/uL   Hemoglobin 16.0  14.1 - 18.1 g/dL   HCT, POC 49.6  43.5 - 53.7 %   MCV 92.3  80 - 97 fL   MCH, POC 29.8  27 - 31.2 pg   MCHC 32.8  31.8 - 35.4 g/dL   RDW, POC 15.3     Platelet Count, POC 188  142 - 424 K/uL   MPV 10.0  0 - 99.8 fL  POCT SEDIMENTATION RATE      Result  Value Ref Range   POCT SED RATE 58 (*) 0 - 22 mm/hr  POCT GLYCOSYLATED  HEMOGLOBIN (HGB A1C)      Result Value Ref Range   Hemoglobin A1C 6.8         Assessment & Plan:   Tremor - Plan: TSH  Nausea alone - Plan: TSH, POCT glycosylated hemoglobin (Hb A1C), Lipid panel  Night sweat - Plan: POCT CBC, POCT SEDIMENTATION RATE, Comprehensive metabolic panel, TSH, POCT glycosylated hemoglobin (Hb A1C), Lipid panel - pt w wide variety of new onset mild systemic sxs. rec watchful waiting while labs return as exam today nml, could be developing a self-limited viral illness but if does not resolve in sev d or worsens at all w/ HA, f/c, vomiting, etc, rtc immed for further eval. Hold off on any meds for now so as to not complicate picture - pt agreeable to plan.  Essential hypertension, benign  Low back pain  Left leg pain - Plan: HYDROcodone-acetaminophen (NORCO) 10-325 MG per tablet - refilled, prev rx bt pcp dr. Joseph Art.  Type II or unspecified type diabetes mellitus without mention of complication, not stated as uncontrolled - new diagnosis today w a1c of 6.8.  Pt's BMI is normal so do not recommend weight loss, does not currently need meds, does not have meter. Gave AVS w/ diet info - rec minimizing white carbs/sugar and increasing protein, veggie, and whole grain. Recheck in 3 mos - if not improved will need rx for meter and referral to DM ed, consider metformin. Meds ordered this encounter  Medications  . HYDROcodone-acetaminophen (NORCO) 10-325 MG per tablet    Sig: Take 1 tablet by mouth every 8 (eight) hours as needed.    Dispense:  30 tablet    Refill:  0  . fluticasone (FLONASE) 50 MCG/ACT nasal spray    Sig: Place 2 sprays into both nostrils daily.    Dispense:  16 g    Refill:  6    [name], MD MPH

## 2013-05-24 ENCOUNTER — Telehealth: Payer: Self-pay | Admitting: Family Medicine

## 2013-05-24 NOTE — Telephone Encounter (Signed)
Patient called for lab results.  Please review and advise.

## 2013-05-25 ENCOUNTER — Encounter: Payer: Self-pay | Admitting: *Deleted

## 2013-05-25 NOTE — Telephone Encounter (Signed)
Pt called back in regards to lab results . He is aware to return if he still continues to have symptoms ac

## 2013-06-03 ENCOUNTER — Encounter (INDEPENDENT_AMBULATORY_CARE_PROVIDER_SITE_OTHER): Payer: Medicare Other | Admitting: General Surgery

## 2013-06-16 ENCOUNTER — Ambulatory Visit (INDEPENDENT_AMBULATORY_CARE_PROVIDER_SITE_OTHER): Payer: Medicare Other | Admitting: General Surgery

## 2013-06-16 ENCOUNTER — Encounter (INDEPENDENT_AMBULATORY_CARE_PROVIDER_SITE_OTHER): Payer: Self-pay | Admitting: General Surgery

## 2013-06-16 VITALS — BP 130/88 | HR 80 | Temp 97.7°F | Resp 16 | Ht 64.0 in | Wt 140.0 lb

## 2013-06-16 DIAGNOSIS — Z9889 Other specified postprocedural states: Secondary | ICD-10-CM

## 2013-06-16 DIAGNOSIS — M7989 Other specified soft tissue disorders: Secondary | ICD-10-CM

## 2013-06-16 NOTE — Progress Notes (Signed)
Subjective:     Patient ID: Alexander [name], male   DOB: Jun 26, 1945, 68 y.o.   MRN: [id number]  HPI The patient is a 68 year old male with a previous laparoscopic right inguinal hernia repair with mesh. Patient comes today with a chief complaint of a right medial thigh nodule that had initially occurred several days ago but has resolved. Patient lies has no symptoms and no signs of previous infection.  Review of Systems  Constitutional: Negative.   HENT: Negative.   Eyes: Negative.   Respiratory: Negative.   Cardiovascular: Negative.   Gastrointestinal: Negative.   Endocrine: Negative.   Neurological: Negative.        Objective:   Physical Exam  Constitutional: He is oriented to person, place, and time. He appears well-developed and well-nourished.  HENT:  Head: Normocephalic and atraumatic.  Eyes: Conjunctivae and EOM are normal. Pupils are equal, round, and reactive to light.  Neck: Normal range of motion. Neck supple.  Cardiovascular: Normal rate, regular rhythm and normal heart sounds.   Pulmonary/Chest: Effort normal and breath sounds normal.  Abdominal: Soft. Bowel sounds are normal. Hernia confirmed negative in the right inguinal area.  Musculoskeletal: Normal range of motion.  Neurological: He is alert and oriented to person, place, and time.  Skin: Skin is warm and dry.       Assessment:     68 year old male status post laparoscopic right inguinal hernia repair, and likely resolving inflamed right inguinal lymph node.     Plan:     1. Patient to follow up as needed

## 2013-06-30 ENCOUNTER — Ambulatory Visit: Payer: Medicare Other | Admitting: Nurse Practitioner

## 2013-07-06 ENCOUNTER — Telehealth: Payer: Self-pay

## 2013-07-06 NOTE — Telephone Encounter (Signed)
DR Brigitte Pulse   Patient would like for you to call him and discuss "diabetic" as noted on his chart.   [phone]

## 2013-07-07 NOTE — Telephone Encounter (Signed)
Patient states that he has been trying to call Dr. Brigitte Pulse for the last two weeks. He states that the last report he received said he was a diabetic and he is concerned about that.  I looked at his bloodwork from April and I do not see in Dr. Raul Del notes any mention of diabetes.  Please advise.

## 2013-07-08 NOTE — Telephone Encounter (Signed)
From my 4/28 note, I wrote:  "Type II or unspecified type diabetes mellitus without mention of complication, not stated as uncontrolled - new diagnosis today w a1c of 6.8. Pt's BMI is normal so do not recommend weight loss, does not currently need meds, does not have meter. Gave AVS w/ diet info - rec minimizing white carbs/sugar and increasing protein, veggie, and whole grain. Recheck in 3 mos - if not improved will need rx for meter and referral to DM ed, consider metformin.  He newly developed DM at his last OV. Come back in 1 mo to be rechecked and see if he needs med. Refer to dm ed if pt would like.

## 2013-07-08 NOTE — Telephone Encounter (Signed)
To Dr. Brigitte Pulse- Juluis Rainier

## 2013-07-08 NOTE — Telephone Encounter (Signed)
Spoke to patient advised him of Dr. Raul Del message.  He said he will follow diet and see her in a month.

## 2013-08-25 ENCOUNTER — Ambulatory Visit (INDEPENDENT_AMBULATORY_CARE_PROVIDER_SITE_OTHER): Payer: Medicare Other | Admitting: Podiatry

## 2013-08-25 ENCOUNTER — Encounter: Payer: Self-pay | Admitting: Podiatry

## 2013-08-25 VITALS — BP 154/89 | HR 74 | Resp 12

## 2013-08-25 DIAGNOSIS — Q828 Other specified congenital malformations of skin: Secondary | ICD-10-CM

## 2013-08-25 NOTE — Progress Notes (Signed)
   Subjective:    Patient ID: Alexander [name], male    DOB: [birth year], 68 y.o.   MRN: [id number]  HPI N-THICK SKIN, PAINFUL D-5 YEARS L-B/L FEET AND RT HEEL O-SLOWLY C-SAME A-PRESSURE T-KEEP THEM TRIM    Review of Systems  Musculoskeletal: Positive for back pain.  All other systems reviewed and are negative.      Objective:   Physical Exam  Orientated x3 black male  Vascular: The DP and PT pulses 2/4 bilaterally  Neurological: Sensation to 10 g monofilament wire intact 5/5 bilaterally Ankle reflexes equal and reactive bilaterally Vibratory sensation intact bilaterally  Dermatological: Nucleated plantar keratoses plantar fifth right MPJ, and first and fifth MPJ left Keratoses plantar right heel  Musculoskeletal: No restriction ankle, subtalar, midtarsal joints bilaterally     Assessment & Plan:   Assessment: Porokeratosis x3 Keratoses x1  Plan: I had a discussion with patient about the chronic nature of porokeratosis and treatment options. The porokeratosis were debrided and packed with salinocaine.  Reappoint at patient's request

## 2013-08-30 ENCOUNTER — Other Ambulatory Visit: Payer: Self-pay | Admitting: Neurosurgery

## 2013-09-03 ENCOUNTER — Encounter (HOSPITAL_COMMUNITY): Payer: Self-pay | Admitting: Pharmacy Technician

## 2013-09-03 NOTE — Pre-Procedure Instructions (Signed)
Cameo Shewell Lawrence  09/03/2013   Your procedure is scheduled on:  Tuesday, August 25th  Report to [facility] Admitting at 530 AM.  Call this number if you have problems the morning of surgery: [phone]   Remember:   Do not eat food or drink liquids after midnight.   Take these medicines the morning of surgery with A SIP OF WATER: norvasc, flonase, neurontin, pain medication if needed   Do not wear jewelry.  Do not wear lotions, powders, or perfumes. You may wear deodorant.  Do not shave 48 hours prior to surgery. Men may shave face and neck.  Do not bring valuables to the hospital.  [facility] is not responsible for any belongings or valuables.               Contacts, dentures or bridgework may not be worn into surgery.  Leave suitcase in the car. After surgery it may be brought to your room.  For patients admitted to the hospital, discharge time is determined by your treatment team.               Patients discharged the day of surgery will not be allowed to drive home.  Please read over the following fact sheets that you were given: Pain Booklet, Coughing and Deep Breathing, MRSA Information and Surgical Site Infection Prevention [place] - Preparing for Surgery  Before surgery, you can play an important role.  Because skin is not sterile, your skin needs to be as free of germs as possible.  You can reduce the number of germs on you skin by washing with CHG (chlorahexidine gluconate) soap before surgery.  CHG is an antiseptic cleaner which kills germs and bonds with the skin to continue killing germs even after washing.  Please DO NOT use if you have an allergy to CHG or antibacterial soaps.  If your skin becomes reddened/irritated stop using the CHG and inform your nurse when you arrive at Short Stay.  Do not shave (including legs and underarms) for at least 48 hours prior to the first CHG shower.  You may shave your face.  Please follow these instructions  carefully:   1.  Shower with CHG Soap the night before surgery and the morning of Surgery.  2.  If you choose to wash your hair, wash your hair first as usual with your normal shampoo.  3.  After you shampoo, rinse your hair and body thoroughly to remove the shampoo.  4.  Use CHG as you would any other liquid soap.  You can apply CHG directly to the skin and wash gently with scrungie or a clean washcloth.  5.  Apply the CHG Soap to your body ONLY FROM THE NECK DOWN.  Do not use on open wounds or open sores.  Avoid contact with your eyes, ears, mouth and genitals (private parts).  Wash genitals (private parts) with your normal soap.  6.  Wash thoroughly, paying special attention to the area where your surgery will be performed.  7.  Thoroughly rinse your body with warm water from the neck down.  8.  DO NOT shower/wash with your normal soap after using and rinsing off the CHG Soap.  9.  Pat yourself dry with a clean towel.            10.  Wear clean pajamas.            11.  Place clean sheets on your bed the night of your  first shower and do not sleep with pets.  Day of Surgery  Do not apply any lotions/deoderants the morning of surgery.  Please wear clean clothes to the hospital/surgery center.

## 2013-09-06 ENCOUNTER — Encounter (HOSPITAL_COMMUNITY)
Admission: RE | Admit: 2013-09-06 | Discharge: 2013-09-06 | Disposition: A | Discharge status: Home / Self Care | Payer: Medicare Other | Source: Ambulatory Visit | Attending: Anesthesiology | Admitting: Anesthesiology

## 2013-09-06 ENCOUNTER — Encounter (HOSPITAL_COMMUNITY): Payer: Self-pay

## 2013-09-06 ENCOUNTER — Encounter (HOSPITAL_COMMUNITY)
Admission: RE | Admit: 2013-09-06 | Discharge: 2013-09-06 | Disposition: A | Discharge status: Home / Self Care | Payer: Medicare Other | Source: Ambulatory Visit | Attending: Neurosurgery | Admitting: Neurosurgery

## 2013-09-06 DIAGNOSIS — IMO0002 Reserved for concepts with insufficient information to code with codable children: Secondary | ICD-10-CM | POA: Insufficient documentation

## 2013-09-06 DIAGNOSIS — Z01810 Encounter for preprocedural cardiovascular examination: Secondary | ICD-10-CM | POA: Insufficient documentation

## 2013-09-06 DIAGNOSIS — F17200 Nicotine dependence, unspecified, uncomplicated: Secondary | ICD-10-CM | POA: Diagnosis not present

## 2013-09-06 DIAGNOSIS — I10 Essential (primary) hypertension: Secondary | ICD-10-CM | POA: Insufficient documentation

## 2013-09-06 DIAGNOSIS — G709 Myoneural disorder, unspecified: Secondary | ICD-10-CM | POA: Insufficient documentation

## 2013-09-06 DIAGNOSIS — Z8546 Personal history of malignant neoplasm of prostate: Secondary | ICD-10-CM | POA: Insufficient documentation

## 2013-09-06 HISTORY — DX: Myoneural disorder, unspecified: G70.9

## 2013-09-06 LAB — SURGICAL PCR SCREEN
MRSA, PCR: NEGATIVE
Staphylococcus aureus: POSITIVE — AB

## 2013-09-06 LAB — CBC
HCT: 44 % (ref 39.0–52.0)
Hemoglobin: 14.4 g/dL (ref 13.0–17.0)
MCH: 29.4 pg (ref 26.0–34.0)
MCHC: 32.7 g/dL (ref 30.0–36.0)
MCV: 90 fL (ref 78.0–100.0)
PLATELETS: 233 10*3/uL (ref 150–400)
RBC: 4.89 MIL/uL (ref 4.22–5.81)
RDW: 13.7 % (ref 11.5–15.5)
WBC: 9.5 10*3/uL (ref 4.0–10.5)

## 2013-09-06 LAB — BASIC METABOLIC PANEL
ANION GAP: 11 (ref 5–15)
BUN: 12 mg/dL (ref 6–23)
CALCIUM: 9.5 mg/dL (ref 8.4–10.5)
CHLORIDE: 104 meq/L (ref 96–112)
CO2: 24 mEq/L (ref 19–32)
Creatinine, Ser: 0.87 mg/dL (ref 0.50–1.35)
GFR calc Af Amer: >90 mL/min (ref >90)
GFR, EST NON AFRICAN AMERICAN: 87 mL/min — AB (ref >90)
Glucose, Bld: 150 mg/dL — ABNORMAL HIGH (ref 70–99)
Potassium: 4.1 mEq/L (ref 3.7–5.3)
Sodium: 139 mEq/L (ref 137–147)

## 2013-09-06 NOTE — Progress Notes (Signed)
Primary - Urgent [facility] [address] [facility] [place] No current cardiologist . Saw dr. spruill 5-6 years ago for preventive check up. No recent ekg

## 2013-09-06 NOTE — Progress Notes (Signed)
rx called in for mupirocin to [facility] per patient request

## 2013-09-07 ENCOUNTER — Encounter (HOSPITAL_COMMUNITY): Payer: Self-pay | Admitting: Emergency Medicine

## 2013-09-07 NOTE — Progress Notes (Signed)
Anesthesia Chart Review:  Pt is 68 year old male posted for lumbar laminectomy on 09/14/13 with Dr. Hal Neer.   PMH: HTN, neuromuscular disorder, prostate cancer. Current smoker (51 pack year hx).  Preoperative labs reviewed.   Chest x-ray reviewed.  EKG shows NSR, T wave abnormality, consider lateral ischemia. Confirmed by Dr. Percival Spanish who notes "no significant changes since prior tracing" in 2011.   Last stress test was 08/20/2004.   Discussed with Dr. Tamela Gammon. He advises pt will need cardiac clearance prior to surgery.   Willeen Cass, FNP-BC [facility] Short Stay Surgical Center/Anesthesiology Phone: [phone] 09/07/2013 5:06 PM

## 2013-09-08 ENCOUNTER — Telehealth: Payer: Self-pay | Admitting: Diagnostic Radiology

## 2013-09-08 ENCOUNTER — Ambulatory Visit (INDEPENDENT_AMBULATORY_CARE_PROVIDER_SITE_OTHER): Payer: Medicare Other | Admitting: Internal Medicine

## 2013-09-08 ENCOUNTER — Encounter: Payer: Self-pay | Admitting: Internal Medicine

## 2013-09-08 VITALS — BP 130/78 | HR 84 | Ht 65.0 in | Wt 138.1 lb

## 2013-09-08 DIAGNOSIS — R9431 Abnormal electrocardiogram [ECG] [EKG]: Secondary | ICD-10-CM

## 2013-09-08 DIAGNOSIS — I10 Essential (primary) hypertension: Secondary | ICD-10-CM

## 2013-09-08 DIAGNOSIS — Z01810 Encounter for preprocedural cardiovascular examination: Secondary | ICD-10-CM

## 2013-09-08 NOTE — Telephone Encounter (Signed)
DR HILTY SPOKE TO DR Hal Neer. APPOINTMENT MADE FORTODAY

## 2013-09-08 NOTE — Progress Notes (Signed)
OFFICE NOTE  Chief Complaint:  Preoperative cardiovascular exam  Primary Care Physician: Robyn Haber, MD  HPI:  Alexander [name] is a pleasant 68 year old male who is referred to me for preoperative cardiac risk assessment. He was scheduled to undergo lumbar disc compression under general anesthesia by Dr. Hal Neer next week. During preoperative workup he was found to have an abnormal EKG showing lateral T-wave abnormalities concerning for possible ischemia. This was noted to be unchanged in comparison to an EKG in 2011. He does have a history of hypertension which is long-standing but no evidence for dyslipidemia or other coronary problems. There is a family history of heart failure in his mother she also had diabetes. Her father died of what sounds like infection and after a fall. 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 He has not done much activity recently due to his back problems. He denies any chest pain or worsening shortness of breath with exertion.  PMHx:  Past Medical History  Diagnosis Date  . Cancer     prostate  . Hypertension   . Neuromuscular disorder     Past Surgical History  Procedure Laterality Date  . Prostate surgery  2005  . Hernia repair    . Colon surgery      "locked bowel" fixed    FAMHx:  Family History  Problem Relation Age of Onset  . Diabetes Mother   . Depression Father   . Hyperlipidemia Sister     SOCHx:   reports that he has been smoking Cigarettes.  He has a 51 pack-year smoking history. He has never used smokeless tobacco. He reports that he does not drink alcohol or use illicit drugs.  ALLERGIES:  No Known Allergies  ROS: A comprehensive review of systems was negative except for: Musculoskeletal: positive for back pain  HOME MEDS: Current Outpatient Prescriptions  Medication Sig Dispense Refill  .  amLODipine (NORVASC) 5 MG tablet Take 1 tablet (5 mg total) by mouth daily.  90 tablet  3  . aspirin 81 MG tablet Take 81 mg by mouth daily.      . fluticasone (FLONASE) 50 MCG/ACT nasal spray Place 2 sprays into both nostrils daily as needed for allergies.      Marland Kitchen gabapentin (NEURONTIN) 300 MG capsule Take 3 capsules (900 mg total) by mouth 3 (three) times daily.  90 capsule  3  . HYDROcodone-acetaminophen (NORCO) 10-325 MG per tablet Take 1 tablet by mouth every 6 (six) hours as needed for moderate pain.       No current facility-administered medications for this visit.    LABS/IMAGING: No results found for this or any previous visit (from the past 48 hour(s)). No results found.  VITALS: BP 130/78  Pulse 84  Ht 5\' 5"  (1.651 m)  Wt 138 lb 1.6 oz (62.642 kg)  BMI 22.98 kg/m2  EXAM: General appearance: alert and no distress Neck: no carotid bruit and no JVD Lungs: clear to auscultation bilaterally Heart: regular rate and rhythm, S1, S2 normal, no murmur, click, rub or gallop Abdomen: soft, non-tender; bowel sounds normal; no masses,  no organomegaly Extremities: extremities normal, atraumatic, no cyanosis or edema Pulses: 2+ and symmetric Skin: Skin color, texture, turgor normal. No rashes or lesions Neurologic: Mental status: Alert, oriented, thought content appropriate Psych: Pleasant  EKG: Normal sinus rhythm at 78, lateral T-wave inversions  ASSESSMENT: 1. Indeterminate risk for upcoming surgery 2. Hypertension-controlled 3. Abnormal preoperative EKG  PLAN: 1.   [name] has an abnormal EKG which could represent ischemia. He's not describing any chest pain symptoms and seems to be able to exert himself without difficulties. I would recommend an exercise stress test to further evaluate for ischemia. Although I suspect the T-wave abnormalities are related to strain in the setting of some LVH given his long-standing hypertension.  He does feel that he can exercise on a  treadmill despite his back problem. I did talk at length about the incline and rate of a treadmill but he says that he is confident that he can do this exercise. Therefore we'll schedule him for a Bruce treadmill stress test would totally we can accomplish in the next one or 2 days. If this is negative then we can confidently release him for surgery next week.  Thanks as always for the referral.  I'm always happy to be of assistance.  Pixie Casino, MD, [facility] Attending Cardiologist [facility]  Romayne Ticas C 09/08/2013, 6:02 PM

## 2013-09-08 NOTE — Patient Instructions (Signed)
Your physician has requested that you have an exercise tolerance test. For further information please visit [email or web address]. Please also follow instruction sheet, as given. ** please schedule stress test for this week!  Your physician recommends that you schedule a follow-up appointment as needed.

## 2013-09-08 NOTE — Progress Notes (Signed)
Anesthesia Update:  Alexander [name] at Dr. Sandrea Hammond office notified of need for preoperative cardiology evaluation.  Their office will arrange.  [name] Short Stay Center/Anesthesiology Phone [phone] 09/08/2013 9:19 AM

## 2013-09-09 ENCOUNTER — Telehealth (HOSPITAL_COMMUNITY): Payer: Self-pay

## 2013-09-09 NOTE — Telephone Encounter (Signed)
Encounter complete. 

## 2013-09-10 ENCOUNTER — Telehealth: Payer: Self-pay | Admitting: *Deleted

## 2013-09-10 ENCOUNTER — Telehealth: Payer: Self-pay | Admitting: Internal Medicine

## 2013-09-10 ENCOUNTER — Other Ambulatory Visit: Payer: Self-pay | Admitting: *Deleted

## 2013-09-10 ENCOUNTER — Encounter: Payer: Self-pay | Admitting: Cardiology

## 2013-09-10 ENCOUNTER — Ambulatory Visit (HOSPITAL_COMMUNITY)
Admission: RE | Admit: 2013-09-10 | Discharge: 2013-09-10 | Disposition: A | Discharge status: Home / Self Care | Payer: Medicare Other | Source: Ambulatory Visit | Attending: Cardiovascular Disease | Admitting: Cardiovascular Disease

## 2013-09-10 DIAGNOSIS — D689 Coagulation defect, unspecified: Secondary | ICD-10-CM

## 2013-09-10 DIAGNOSIS — Z01818 Encounter for other preprocedural examination: Secondary | ICD-10-CM

## 2013-09-10 DIAGNOSIS — R9439 Abnormal result of other cardiovascular function study: Secondary | ICD-10-CM

## 2013-09-10 DIAGNOSIS — R5381 Other malaise: Secondary | ICD-10-CM

## 2013-09-10 DIAGNOSIS — R9431 Abnormal electrocardiogram [ECG] [EKG]: Secondary | ICD-10-CM | POA: Diagnosis present

## 2013-09-10 DIAGNOSIS — R5383 Other fatigue: Secondary | ICD-10-CM

## 2013-09-10 DIAGNOSIS — Z01810 Encounter for preprocedural cardiovascular examination: Secondary | ICD-10-CM | POA: Diagnosis not present

## 2013-09-10 NOTE — Telephone Encounter (Signed)
Please call,she needs to find out what is going on with her father/ She need to know what was said in the last office visit,especially about the stents.

## 2013-09-10 NOTE — Telephone Encounter (Signed)
Left VM for patient to return call about heart cath. Labs have been ordered. Scheduler notified.

## 2013-09-10 NOTE — Telephone Encounter (Signed)
Faxed notification that patient's surgery will need to be postponed until later date. Patient had abnormal stress test today - Dr. Debara Pickett talked with him about needing a LHC next week for definitive clearance.   RN will order cath/pre-procedure labs and proceed with scheduling

## 2013-09-10 NOTE — Procedures (Signed)
Exercise Treadmill Test   Test  Exercise Tolerance Test Ordering MD: Pixie Casino, MD    Unique Test No: 1   Treadmill:  1  Indication for ETT: ABN EKG; Surgical clearance  Contraindication to ETT: No   Stress Modality: exercise - treadmill  Cardiac Imaging Performed: non   Protocol: standard Bruce - maximal  Max BP:  239/123  Max MPHR (bpm):  153 85% MPR (bpm):  130  MPHR obtained (bpm):  150 % MPHR obtained:  98  Reached 85% MPHR (min:sec):  3:40 Total Exercise Time (min-sec):  5  Workload in METS:  7.0 Borg Scale: 14  Reason ETT Terminated:  exaggerated hypertensive response    ST Segment Analysis At Rest: Normal sinus rhythm with lateral T wave flattening, inversions With Exercise: Significant 2-3 mm horizontal ST depression inferiorly and laterally with exercise  Other Information Arrhythmia:  No Angina during ETT:  absent (0) Quality of ETT:  diagnostic  ETT Interpretation:  abnormal - evidence of ST depression consistent with ischemia  Comments: Fair exercise tolerance Marked hypertensive response to exercise up to 239/123 Significant ST segment depression with exercise No chest pain  Recommendations: Definitive cardiac catheterization is recommended prior to being able to clear for back surgery. Discussed with the patient and he is agreeable to this.  Pixie Casino, MD, [facility] Attending Cardiologist [place]

## 2013-09-10 NOTE — Telephone Encounter (Signed)
Spoke with patient and daughter. Explained LHC and need for labs on Monday. Explained that [place] does not always mean stents will be placed. Informed patient and daughter which MD would be performing procedure, where to go and what time to arrive.   Patient/daughter voiced understanding.

## 2013-09-10 NOTE — Telephone Encounter (Signed)
Returned call to patient's daughter Aniceto Boss she stated she was calling to find out about father.Message sent to Dr.Hilty's nurse Eliezer Lofts. Call her back at # [phone].

## 2013-09-13 ENCOUNTER — Encounter (HOSPITAL_COMMUNITY): Payer: Self-pay | Admitting: Pharmacy Technician

## 2013-09-13 ENCOUNTER — Institutional Professional Consult (permissible substitution): Payer: Medicare Other | Admitting: Cardiology

## 2013-09-13 LAB — CBC
HCT: 42.6 % (ref 39.0–52.0)
Hemoglobin: 14.7 g/dL (ref 13.0–17.0)
MCH: 29.4 pg (ref 26.0–34.0)
MCHC: 34.5 g/dL (ref 30.0–36.0)
MCV: 85.2 fL (ref 78.0–100.0)
PLATELETS: 251 10*3/uL (ref 150–400)
RBC: 5 MIL/uL (ref 4.22–5.81)
RDW: 14.8 % (ref 11.5–15.5)
WBC: 7.9 10*3/uL (ref 4.0–10.5)

## 2013-09-13 NOTE — Telephone Encounter (Signed)
Spoke with patient. He had labs done today.

## 2013-09-13 NOTE — Telephone Encounter (Signed)
Left VM with reminder of procedure date/time, what time to arrive at hospital, nothing to eat/drink after midnight and to have lab work today.

## 2013-09-14 ENCOUNTER — Inpatient Hospital Stay (HOSPITAL_COMMUNITY): Admission: RE | Admit: 2013-09-14 | Payer: Medicare Other | Source: Ambulatory Visit | Admitting: Neurosurgery

## 2013-09-14 ENCOUNTER — Encounter (HOSPITAL_COMMUNITY): Admission: RE | Payer: Self-pay | Source: Ambulatory Visit

## 2013-09-14 LAB — APTT: APTT: 33 s (ref 24–37)

## 2013-09-14 LAB — BASIC METABOLIC PANEL
BUN: 13 mg/dL (ref 6–23)
CO2: 26 mEq/L (ref 19–32)
Calcium: 9.5 mg/dL (ref 8.4–10.5)
Chloride: 105 mEq/L (ref 96–112)
Creat: 0.96 mg/dL (ref 0.50–1.35)
Glucose, Bld: 131 mg/dL — ABNORMAL HIGH (ref 70–99)
POTASSIUM: 4.3 meq/L (ref 3.5–5.3)
Sodium: 139 mEq/L (ref 135–145)

## 2013-09-14 LAB — PROTIME-INR
INR: 1.12 (ref <1.50)
PROTHROMBIN TIME: 14.4 s (ref 11.6–15.2)

## 2013-09-14 LAB — TSH: TSH: 1.185 u[IU]/mL (ref 0.350–4.500)

## 2013-09-14 SURGERY — LUMBAR LAMINECTOMY WITH COFLEX 2 LEVEL
Anesthesia: General | Site: Back | Laterality: Bilateral

## 2013-09-15 ENCOUNTER — Encounter (HOSPITAL_COMMUNITY)
Admission: RE | Disposition: A | Discharge status: Home / Self Care | Payer: Self-pay | Source: Ambulatory Visit | Attending: Cardiology

## 2013-09-15 ENCOUNTER — Ambulatory Visit (HOSPITAL_COMMUNITY)
Admission: RE | Admit: 2013-09-15 | Discharge: 2013-09-15 | Disposition: A | Discharge status: Home / Self Care | Payer: Medicare Other | Source: Ambulatory Visit | Attending: Cardiology | Admitting: Cardiology

## 2013-09-15 DIAGNOSIS — R9439 Abnormal result of other cardiovascular function study: Secondary | ICD-10-CM | POA: Diagnosis present

## 2013-09-15 DIAGNOSIS — F17200 Nicotine dependence, unspecified, uncomplicated: Secondary | ICD-10-CM | POA: Insufficient documentation

## 2013-09-15 DIAGNOSIS — I10 Essential (primary) hypertension: Secondary | ICD-10-CM | POA: Diagnosis not present

## 2013-09-15 DIAGNOSIS — Z7982 Long term (current) use of aspirin: Secondary | ICD-10-CM | POA: Diagnosis not present

## 2013-09-15 DIAGNOSIS — I2510 Atherosclerotic heart disease of native coronary artery without angina pectoris: Secondary | ICD-10-CM

## 2013-09-15 DIAGNOSIS — Z01818 Encounter for other preprocedural examination: Secondary | ICD-10-CM

## 2013-09-15 HISTORY — PX: LEFT HEART CATHETERIZATION WITH CORONARY ANGIOGRAM: SHX5451

## 2013-09-15 LAB — POCT I-STAT 3, VENOUS BLOOD GAS (G3P V)
Bicarbonate: 24.5 mEq/L — ABNORMAL HIGH (ref 20.0–24.0)
O2 Saturation: 74 %
PCO2 VEN: 40 mmHg — AB (ref 45.0–50.0)
PH VEN: 7.395 — AB (ref 7.250–7.300)
PO2 VEN: 40 mmHg (ref 30.0–45.0)
TCO2: 26 mmol/L (ref 0–100)

## 2013-09-15 LAB — POCT I-STAT 3, ART BLOOD GAS (G3+)
Acid-base deficit: 1 mmol/L (ref 0.0–2.0)
BICARBONATE: 23.9 meq/L (ref 20.0–24.0)
O2 SAT: 93 %
PCO2 ART: 39.9 mmHg (ref 35.0–45.0)
PO2 ART: 67 mmHg — AB (ref 80.0–100.0)
TCO2: 25 mmol/L (ref 0–100)
pH, Arterial: 7.386 (ref 7.350–7.450)

## 2013-09-15 LAB — POCT ACTIVATED CLOTTING TIME: Activated Clotting Time: 157 seconds

## 2013-09-15 SURGERY — LEFT HEART CATHETERIZATION WITH CORONARY ANGIOGRAM
Anesthesia: LOCAL

## 2013-09-15 MED ORDER — ASPIRIN 81 MG PO CHEW
CHEWABLE_TABLET | ORAL | Status: AC
  Filled 2013-09-15: qty 1

## 2013-09-15 MED ORDER — HEPARIN (PORCINE) IN NACL 2-0.9 UNIT/ML-% IJ SOLN
INTRAMUSCULAR | Status: AC
  Filled 2013-09-15: qty 1000

## 2013-09-15 MED ORDER — NITROGLYCERIN 1 MG/10 ML FOR IR/CATH LAB
INTRA_ARTERIAL | Status: AC
  Filled 2013-09-15: qty 10

## 2013-09-15 MED ORDER — SODIUM CHLORIDE 0.9 % IV SOLN: 1.0000 mL/kg/h | INTRAVENOUS | Status: DC

## 2013-09-15 MED ORDER — FENTANYL CITRATE 0.05 MG/ML IJ SOLN
INTRAMUSCULAR | Status: AC
  Filled 2013-09-15: qty 2

## 2013-09-15 MED ORDER — MIDAZOLAM HCL 2 MG/2ML IJ SOLN
INTRAMUSCULAR | Status: AC
  Filled 2013-09-15: qty 2

## 2013-09-15 MED ORDER — LIDOCAINE HCL (PF) 1 % IJ SOLN
INTRAMUSCULAR | Status: AC
  Filled 2013-09-15: qty 30

## 2013-09-15 MED ORDER — VERAPAMIL HCL 2.5 MG/ML IV SOLN
INTRAVENOUS | Status: AC
  Filled 2013-09-15: qty 2

## 2013-09-15 MED ORDER — ASPIRIN 81 MG PO CHEW
81.0000 mg | CHEWABLE_TABLET | ORAL | Status: AC
  Administered 2013-09-15: 81 mg via ORAL

## 2013-09-15 MED ORDER — SODIUM CHLORIDE 0.9 % IV SOLN
INTRAVENOUS | Status: DC
  Administered 2013-09-15: 1000 mL via INTRAVENOUS

## 2013-09-15 MED ORDER — HEPARIN SODIUM (PORCINE) 1000 UNIT/ML IJ SOLN
INTRAMUSCULAR | Status: AC
  Filled 2013-09-15: qty 1

## 2013-09-15 MED ORDER — SODIUM CHLORIDE 0.9 % IJ SOLN: 3.0000 mL | INTRAMUSCULAR | Status: DC | PRN

## 2013-09-15 NOTE — H&P (View-Only) (Signed)
OFFICE NOTE  Chief Complaint:  Preoperative cardiovascular exam  Primary Care Physician: Robyn Haber, MD  HPI:  Alexander [name] is a pleasant 68 year old male who is referred to me for preoperative cardiac risk assessment. He was scheduled to undergo lumbar disc compression under general anesthesia by Dr. Hal Neer next week. During preoperative workup he was found to have an abnormal EKG showing lateral T-wave abnormalities concerning for possible ischemia. This was noted to be unchanged in comparison to an EKG in 2011. He does have a history of hypertension which is long-standing but no evidence for dyslipidemia or other coronary problems. There is a family history of heart failure in his mother she also had diabetes. Her father died of what sounds like infection and after a fall. 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 He has not done much activity recently due to his back problems. He denies any chest pain or worsening shortness of breath with exertion.  PMHx:  Past Medical History  Diagnosis Date  . Cancer     prostate  . Hypertension   . Neuromuscular disorder     Past Surgical History  Procedure Laterality Date  . Prostate surgery  2005  . Hernia repair    . Colon surgery      "locked bowel" fixed    FAMHx:  Family History  Problem Relation Age of Onset  . Diabetes Mother   . Depression Father   . Hyperlipidemia Sister     SOCHx:   reports that he has been smoking Cigarettes.  He has a 51 pack-year smoking history. He has never used smokeless tobacco. He reports that he does not drink alcohol or use illicit drugs.  ALLERGIES:  No Known Allergies  ROS: A comprehensive review of systems was negative except for: Musculoskeletal: positive for back pain  HOME MEDS: Current Outpatient Prescriptions  Medication Sig Dispense Refill  .  amLODipine (NORVASC) 5 MG tablet Take 1 tablet (5 mg total) by mouth daily.  90 tablet  3  . aspirin 81 MG tablet Take 81 mg by mouth daily.      . fluticasone (FLONASE) 50 MCG/ACT nasal spray Place 2 sprays into both nostrils daily as needed for allergies.      Marland Kitchen gabapentin (NEURONTIN) 300 MG capsule Take 3 capsules (900 mg total) by mouth 3 (three) times daily.  90 capsule  3  . HYDROcodone-acetaminophen (NORCO) 10-325 MG per tablet Take 1 tablet by mouth every 6 (six) hours as needed for moderate pain.       No current facility-administered medications for this visit.    LABS/IMAGING: No results found for this or any previous visit (from the past 48 hour(s)). No results found.  VITALS: BP 130/78  Pulse 84  Ht 5\' 5"  (1.651 m)  Wt 138 lb 1.6 oz (62.642 kg)  BMI 22.98 kg/m2  EXAM: General appearance: alert and no distress Neck: no carotid bruit and no JVD Lungs: clear to auscultation bilaterally Heart: regular rate and rhythm, S1, S2 normal, no murmur, click, rub or gallop Abdomen: soft, non-tender; bowel sounds normal; no masses,  no organomegaly Extremities: extremities normal, atraumatic, no cyanosis or edema Pulses: 2+ and symmetric Skin: Skin color, texture, turgor normal. No rashes or lesions Neurologic: Mental status: Alert, oriented, thought content appropriate Psych: Pleasant  EKG: Normal sinus rhythm at 78, lateral T-wave inversions  ASSESSMENT: 1. Indeterminate risk for upcoming surgery 2. Hypertension-controlled 3. Abnormal preoperative EKG  PLAN: 1.   Mr. Lubitz has an abnormal EKG which could represent ischemia. He's not describing any chest pain symptoms and seems to be able to exert himself without difficulties. I would recommend an exercise stress test to further evaluate for ischemia. Although I suspect the T-wave abnormalities are related to strain in the setting of some LVH given his long-standing hypertension.  He does feel that he can exercise on a  treadmill despite his back problem. I did talk at length about the incline and rate of a treadmill but he says that he is confident that he can do this exercise. Therefore we'll schedule him for a Bruce treadmill stress test would totally we can accomplish in the next one or 2 days. If this is negative then we can confidently release him for surgery next week.  Thanks as always for the referral.  I'm always happy to be of assistance.  Pixie Casino, MD, [facility] Attending Cardiologist [facility]  Ankush Gintz C 09/08/2013, 6:02 PM

## 2013-09-15 NOTE — CV Procedure (Signed)
    Cardiac Catheterization Procedure Note  Name: Alexander [name] MRN: [id number] DOB: [birth year]  Procedure: Left Heart Cath, Selective Coronary Angiography, LV angiography  Indication: 68 yo BM with history of HTN. He had an abnormal ETT for preoperative evaluation for back surgery.   Procedural Details: The right wrist was prepped, draped, and anesthetized with 1% lidocaine. Using the modified Seldinger technique, a 6 French slender sheath was introduced into the right radial artery. 3 mg of verapamil was administered through the sheath, weight-based unfractionated heparin was administered intravenously. Standard Judkins catheters were used for selective coronary angiography and left ventriculography. Catheter exchanges were performed over an exchange length guidewire. There were no immediate procedural complications. A TR band was used for radial hemostasis at the completion of the procedure.  The patient was transferred to the post catheterization recovery area for further monitoring.  Procedural Findings: Hemodynamics: AO 131/70 mean 96 mm Hg LV 131/7 mm Hg  Coronary angiography: Coronary dominance: right  Left mainstem: Normal  Left anterior descending (LAD): Normal. There is a large diagonal that extends to the apex and it is normal.   Left circumflex (LCx): 40-50% mid LCx disease. There is a tiny OM3 that is occluded and fills by left to left collaterals.  Right coronary artery (RCA): Large, dominant. Normal.   Left ventriculography: Left ventricular systolic function is overall low normal, LVEF is estimated at 50-55%, there is mild apical hypokinesis. There is no significant mitral regurgitation   Final Conclusions:   1. 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 2. Good LV function.  Recommendations: medical management. He is low risk for planned back surgery.  Peter Martinique, [place]  09/15/2013, 10:52 AM

## 2013-09-15 NOTE — Discharge Instructions (Signed)
Radial Site Care °Refer to this sheet in the next few weeks. These instructions provide you with information on caring for yourself after your procedure. Your caregiver may also give you more specific instructions. Your treatment has been planned according to current medical practices, but problems sometimes occur. Call your caregiver if you have any problems or questions after your procedure. °HOME CARE INSTRUCTIONS °· You may shower the day after the procedure. Remove the bandage (dressing) and gently wash the site with plain soap and water. Gently pat the site dry. °· Do not apply powder or lotion to the site. °· Do not submerge the affected site in water for 3 to 5 days. °· Inspect the site at least twice daily. °· Do not flex or bend the affected arm for 24 hours. °· No lifting over 5 pounds (2.3 kg) for 5 days after your procedure. °· Do not drive home if you are discharged the same day of the procedure. Have someone else drive you. °· You may drive 24 hours after the procedure unless otherwise instructed by your caregiver. °· Do not operate machinery or power tools for 24 hours. °· A responsible adult should be with you for the first 24 hours after you arrive home. °What to expect: °· Any bruising will usually fade within 1 to 2 weeks. °· Blood that collects in the tissue (hematoma) may be painful to the touch. It should usually decrease in size and tenderness within 1 to 2 weeks. °SEEK IMMEDIATE MEDICAL CARE IF: °· You have unusual pain at the radial site. °· You have redness, warmth, swelling, or pain at the radial site. °· You have drainage (other than a small amount of blood on the dressing). °· You have chills. °· You have a fever or persistent symptoms for more than 72 hours. °· You have a fever and your symptoms suddenly get worse. °· Your arm becomes pale, cool, tingly, or numb. °· You have heavy bleeding from the site. Hold pressure on the site. °Document Released: 02/09/2010 Document Revised:  04/01/2011 Document Reviewed: 02/09/2010 °ExitCare® Patient Information ©2015 [employer]. This information is not intended to replace advice given to you by your health care provider. Make sure you discuss any questions you have with your health care provider. ° °

## 2013-09-15 NOTE — Progress Notes (Signed)
Assumed care of pt from Madlyn Frankel, RN. Report received.

## 2013-09-15 NOTE — Interval H&P Note (Signed)
History and Physical Interval Note:  09/15/2013 10:21 AM  Alexander [name]  has presented today for surgery, with the diagnosis of c/p  The various methods of treatment have been discussed with the patient and family. After consideration of risks, benefits and other options for treatment, the patient has consented to  Procedure(s): LEFT HEART CATHETERIZATION WITH CORONARY ANGIOGRAM (N/A) as a surgical intervention .  The patient's history has been reviewed, patient examined, no change in status, stable for surgery.  I have reviewed the patient's chart and labs.  Questions were answered to the patient's satisfaction.     Collier Salina [facility] 09/15/2013 10:21 AM Cath Lab Visit (complete for each Cath Lab visit)  Clinical Evaluation Leading to the Procedure:   ACS: No.  Non-ACS:    Anginal Classification: No Symptoms  Anti-ischemic medical therapy: Minimal Therapy (1 class of medications)  Non-Invasive Test Results: High-risk stress test findings: cardiac mortality >3%/year  Prior CABG: No previous CABG

## 2013-09-17 DIAGNOSIS — R972 Elevated prostate specific antigen [PSA]: Secondary | ICD-10-CM | POA: Insufficient documentation

## 2013-09-21 ENCOUNTER — Telehealth: Payer: Self-pay | Admitting: *Deleted

## 2013-09-21 NOTE — Telephone Encounter (Signed)
Faxed cardiac clearance to [phone] for L4/5, L5/S1 Lumbar Decompression under general anesthesia to Dr. Karie Chimera

## 2013-09-22 ENCOUNTER — Other Ambulatory Visit: Payer: Self-pay | Admitting: Neurosurgery

## 2013-10-26 ENCOUNTER — Inpatient Hospital Stay (HOSPITAL_COMMUNITY): Admit: 2013-10-26 | Payer: Medicare Other | Admitting: Neurosurgery

## 2013-10-26 ENCOUNTER — Encounter (HOSPITAL_COMMUNITY): Payer: Self-pay

## 2013-10-26 SURGERY — LUMBAR LAMINECTOMY WITH COFLEX 2 LEVEL
Anesthesia: General | Site: Back | Laterality: Bilateral

## 2013-12-09 ENCOUNTER — Ambulatory Visit (INDEPENDENT_AMBULATORY_CARE_PROVIDER_SITE_OTHER): Payer: Medicare Other | Admitting: Family Medicine

## 2013-12-09 VITALS — BP 162/90 | HR 68 | Temp 97.8°F | Resp 16 | Ht 64.5 in | Wt 142.2 lb

## 2013-12-09 DIAGNOSIS — M6249 Contracture of muscle, multiple sites: Secondary | ICD-10-CM

## 2013-12-09 DIAGNOSIS — M79605 Pain in left leg: Secondary | ICD-10-CM

## 2013-12-09 DIAGNOSIS — I10 Essential (primary) hypertension: Secondary | ICD-10-CM

## 2013-12-09 DIAGNOSIS — I2510 Atherosclerotic heart disease of native coronary artery without angina pectoris: Secondary | ICD-10-CM

## 2013-12-09 DIAGNOSIS — E1159 Type 2 diabetes mellitus with other circulatory complications: Secondary | ICD-10-CM | POA: Insufficient documentation

## 2013-12-09 DIAGNOSIS — N521 Erectile dysfunction due to diseases classified elsewhere: Secondary | ICD-10-CM

## 2013-12-09 DIAGNOSIS — Z720 Tobacco use: Secondary | ICD-10-CM

## 2013-12-09 DIAGNOSIS — F17200 Nicotine dependence, unspecified, uncomplicated: Secondary | ICD-10-CM | POA: Insufficient documentation

## 2013-12-09 DIAGNOSIS — M62838 Other muscle spasm: Secondary | ICD-10-CM

## 2013-12-09 DIAGNOSIS — Z23 Encounter for immunization: Secondary | ICD-10-CM

## 2013-12-09 LAB — COMPREHENSIVE METABOLIC PANEL
ALT: 15 U/L (ref 0–53)
AST: 16 U/L (ref 0–37)
Albumin: 4.2 g/dL (ref 3.5–5.2)
Alkaline Phosphatase: 137 U/L — ABNORMAL HIGH (ref 39–117)
BUN: 11 mg/dL (ref 6–23)
CALCIUM: 9.5 mg/dL (ref 8.4–10.5)
CHLORIDE: 104 meq/L (ref 96–112)
CO2: 25 meq/L (ref 19–32)
CREATININE: 0.91 mg/dL (ref 0.50–1.35)
GLUCOSE: 129 mg/dL — AB (ref 70–99)
Potassium: 4.4 mEq/L (ref 3.5–5.3)
Sodium: 138 mEq/L (ref 135–145)
Total Bilirubin: 0.5 mg/dL (ref 0.2–1.2)
Total Protein: 7.4 g/dL (ref 6.0–8.3)

## 2013-12-09 LAB — POCT GLYCOSYLATED HEMOGLOBIN (HGB A1C): Hemoglobin A1C: 7.1

## 2013-12-09 MED ORDER — AVANAFIL 200 MG PO TABS: 100.0000 mg | ORAL_TABLET | Freq: Once | ORAL | Status: DC

## 2013-12-09 MED ORDER — AMLODIPINE BESYLATE 10 MG PO TABS: 10.0000 mg | ORAL_TABLET | Freq: Every day | ORAL | Status: DC

## 2013-12-09 MED ORDER — ZOSTER VACCINE LIVE 19400 UNT/0.65ML ~~LOC~~ SOLR: 0.6500 mL | Freq: Once | SUBCUTANEOUS | Status: DC

## 2013-12-09 MED ORDER — METFORMIN HCL 500 MG PO TABS: 500.0000 mg | ORAL_TABLET | Freq: Two times a day (BID) | ORAL | Status: DC

## 2013-12-09 MED ORDER — HYDROCODONE-ACETAMINOPHEN 10-325 MG PO TABS: 1.0000 | ORAL_TABLET | Freq: Four times a day (QID) | ORAL | Status: DC | PRN

## 2013-12-09 MED ORDER — GABAPENTIN 300 MG PO CAPS: 900.0000 mg | ORAL_CAPSULE | Freq: Three times a day (TID) | ORAL | Status: DC

## 2013-12-09 MED ORDER — CYCLOBENZAPRINE HCL 10 MG PO TABS: 10.0000 mg | ORAL_TABLET | Freq: Two times a day (BID) | ORAL | Status: DC | PRN

## 2013-12-09 MED ORDER — BLOOD GLUCOSE MONITORING SUPPL W/DEVICE KIT: 1.0000 | PACK | Freq: Every day | Status: DC

## 2013-12-09 NOTE — Progress Notes (Signed)
Subjective:    Patient ID: Alexander [name], male    DOB: [birth year], 68 y.o.   MRN: [id number] This chart was scribed for [name], MD by [name], Medical Scribe. This patient was seen in Room 1 and the patient's care was started at 10:15 AM.   Chief Complaint  Patient presents with  . Neck Pain    L side neck pain/stiffness- intermittent x1 week. No injury.  . Rash    R upper arm x1-2 weeks     HPI HPI Comments: Neck pain: Alexander [name] is a 68 y.o. male with a hx of HTN and chronic lumbar pain who presents to the [facility] complaining of gradual onset, intermittent left posterior neck pain radiating to left posterior shoulder that began about 1.5 weeks ago. He notes pain with bending neck, but not with lifting with left hand. He has not experienced similar symptoms before. He notes relief with hydrocodone and gabapentin, and he has not used any OTC for his pain. Patient denies dizziness, lightheadedness, HA, vision changes, urine discoloration, leg swelling, numbness and weakness in left hand, and stomach and bowel problems.   He has chronic lumbar pain for which he is on hydrocodone and gabapentin. Patient tries not to take hydrocodone too often, taking it only 1-2 times a month. He gets his medications from a back doctor, who he does not remember the names of.  Biceps soreness: Patient also reports bilateral bicep soreness when he woke up from sleep 4 days ago that gradually went away; it is mostly resolved now.   Rash: Patient also notes having rash on posterior upper arm that began 2 weeks ago. He has tried some itch cream with some short-term relief.  HTN: He has not checked his BP at home lately. He does not have any problems with his current medications. He also has HTN and a systolic BP varies between 130s-160s/70s-80s.   Patient has had a cardiac catheterization in August that showed single vessel, occlusive CAD in a small OM branch recommended for  medical management.   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 Patient still works occasionally and knows when to take it easy.  Past Medical History  Diagnosis Date  . Cancer     prostate  . Hypertension   . Neuromuscular disorder    Current Outpatient Prescriptions on File Prior to Visit  Medication Sig Dispense Refill  . aspirin 81 MG tablet Take 81 mg by mouth daily.    . fluticasone (FLONASE) 50 MCG/ACT nasal spray Place 2 sprays into both nostrils daily as needed for allergies.     No current facility-administered medications on file prior to visit.   No Known Allergies  Review of Systems  Eyes: Negative for visual disturbance.  Cardiovascular: Negative for leg swelling.  Gastrointestinal: Negative for diarrhea and constipation.  Musculoskeletal: Positive for myalgias and neck pain.  Skin: Positive for rash.  Neurological: Negative for weakness, light-headedness, numbness and headaches.       Objective:  BP 162/90 mmHg  Pulse 68  Temp(Src) 97.8 F (36.6 C) (Oral)  Resp 16  Ht 5' 4.5" (1.638 m)  Wt 142 lb 3.2 oz (64.501 kg)  BMI 24.04 kg/m2  SpO2 97%  Physical Exam  Constitutional: He is oriented to person, place, and time. He appears well-developed and well-nourished. No distress.  HENT:  Head: Normocephalic and atraumatic.  Mouth/Throat: Oropharynx is clear and moist. No  oropharyngeal exudate.  Eyes: Pupils are equal, round, and reactive to light.  Neck: Neck supple.  No point tenderness over cervical spinous process or paraspinal muscles. Negative Spurling's.  Cardiovascular: Normal rate.   Pulmonary/Chest: Effort normal.  Musculoskeletal: He exhibits no edema.  Moderately decreased lateral flexion and lateral rotation to left. Normal lateral rotation and lateral rotation to right.  Neurological: He is alert and oriented to person, place, and time. He has normal reflexes. No cranial nerve deficit.  2+ biceps, triceps, and  brachioradialis reflexes bilaterally. 5/5 strength bilaterally in upper extremities.  Skin: Skin is warm and dry.  Psychiatric: He has a normal mood and affect. His behavior is normal.  Nursing note and vitals reviewed.         Assessment & Plan:   HTN (hypertension), benign - Plan: Comprehensive metabolic panel  Tobacco use disorder  Coronary artery disease involving native coronary artery of native heart without angina pectoris - Plan: Comprehensive metabolic panel  Erectile dysfunction due to diseases classified elsewhere - Side effects of diphosphenate inhibitors. explained in detail to patient, in setting of known CAD. Will prescribe [place] since we have coupon.   Sees urologist yearly due to h/o prostate cancer in 2011  Type 2 diabetes mellitus with other circulatory complications - Plan: POCT glycosylated hemoglobin (Hb A1C) - worsening control w/ hgba1c of 7.1 today, was 6.8 6 mos prior when he was initially diagnosed w/ new-onset DM. Start metformin 500 bid and rx'ed monitor/lancets/strips. Consider referral to DM ed at f/u.  He declines flu and PNA vaccines today, but will reconsider in the future.  Need for shingles vaccine - Plan: zoster vaccine live, PF, (ZOSTAVAX) 35361 UNT/0.65ML injection  Essential hypertension - Plan: amLODipine (NORVASC) 10 MG tablet - increase amlodipine from 5 to 10.  Left leg pain - Plan: gabapentin (NEURONTIN) 300 MG capsule - using hydrocodone very sparingly -  reviewed [facility] which showed in the past year, he has only been prescribed hydrocodone 5 times; 4 of them were from me or Dr. Joseph Art, 1 from his orthopedic doctor, Dr. Hal Neer on 8/11.  -Refilled and reviewed controlled sub policy w/ pt.   Neck pain - suspect muscular and will defer XR unless symptoms worsen or continue.  Meds ordered this encounter  Medications  . zoster vaccine live, PF, (ZOSTAVAX) 44315 UNT/0.65ML injection    Sig: Inject 19,400 Units into the skin once.     Dispense:  1 each    Refill:  0  . amLODipine (NORVASC) 10 MG tablet    Sig: Take 1 tablet (10 mg total) by mouth daily.    Dispense:  90 tablet    Refill:  1    D/c prev dose of 59m norvasc  . gabapentin (NEURONTIN) 300 MG capsule    Sig: Take 3 capsules (900 mg total) by mouth 3 (three) times daily.    Dispense:  270 capsule    Refill:  1  . HYDROcodone-acetaminophen (NORCO) 10-325 MG per tablet    Sig: Take 1 tablet by mouth every 6 (six) hours as needed for moderate pain.    Dispense:  60 tablet    Refill:  0  . Avanafil 200 MG TABS    Sig: Take 100 mg by mouth once. 15-30 min before sexual activity. Do not repeat dose within 24 hours    Dispense:  30 tablet    Refill:  0  . cyclobenzaprine (FLEXERIL) 10 MG tablet    Sig: Take 1 tablet (10 mg  total) by mouth 3 times/day as needed-between meals & bedtime for muscle spasms.    Dispense:  30 tablet    Refill:  2  . metFORMIN (GLUCOPHAGE) 500 MG tablet    Sig: Take 1 tablet (500 mg total) by mouth 2 (two) times daily with a meal.    Dispense:  180 tablet    Refill:  1  . Blood Glucose Monitoring Suppl W/DEVICE KIT    Sig: 1 each by Does not apply route daily.    Dispense:  100 each    Refill:  3    I personally performed the services described in this documentation, which was scribed in my presence. The recorded information has been reviewed and considered, and addended by me as needed.  [name], MD MPH  Results for orders placed or performed in visit on 12/09/13  Comprehensive metabolic panel  Result Value Ref Range   Sodium 138 135 - 145 mEq/L   Potassium 4.4 3.5 - 5.3 mEq/L   Chloride 104 96 - 112 mEq/L   CO2 25 19 - 32 mEq/L   Glucose, Bld 129 (H) 70 - 99 mg/dL   BUN 11 6 - 23 mg/dL   Creat 0.91 0.50 - 1.35 mg/dL   Total Bilirubin 0.5 0.2 - 1.2 mg/dL   Alkaline Phosphatase 137 (H) 39 - 117 U/L   AST 16 0 - 37 U/L   ALT 15 0 - 53 U/L   Total Protein 7.4 6.0 - 8.3 g/dL   Albumin 4.2 3.5 - 5.2 g/dL   Calcium 9.5  8.4 - 10.5 mg/dL  POCT glycosylated hemoglobin (Hb A1C)  Result Value Ref Range   Hemoglobin A1C 7.1

## 2013-12-09 NOTE — Progress Notes (Signed)
LMVM for pt to CB to schedule appt with Dr. Brigitte Pulse for 3-4 month diabetic check.

## 2013-12-09 NOTE — Patient Instructions (Addendum)
[facility] Policy for Prescribing Controlled Substances (Revised 11/2011) 1. Prescriptions for controlled substances will be filled by ONE provider at [facility] with whom you have established and developed a plan for your care, including follow-up. 2. You are encouraged to schedule an appointment with your prescriber at our appointment center for follow-up visits whenever possible. 3. If you request a prescription for the controlled substance while at [facility] for an acute problem (with someone other than your regular prescriber), you MAY be given a ONE-TIME prescription for a 30-day supply of the controlled substance, to allow time for you to return to see your regular prescriber for additional prescriptions.  Diabetes Mellitus and Food It is important for you to manage your blood sugar (glucose) level. Your blood glucose level can be greatly affected by what you eat. Eating healthier foods in the appropriate amounts throughout the day at about the same time each day will help you control your blood glucose level. It can also help slow or prevent worsening of your diabetes mellitus. Healthy eating may even help you improve the level of your blood pressure and reach or maintain a healthy weight.  HOW CAN FOOD AFFECT ME? Carbohydrates Carbohydrates affect your blood glucose level more than any other type of food. Your dietitian will help you determine how many carbohydrates to eat at each meal and teach you how to count carbohydrates. Counting carbohydrates is important to keep your blood glucose at a healthy level, especially if you are using insulin or taking certain medicines for diabetes mellitus. Alcohol Alcohol can cause sudden decreases in blood glucose (hypoglycemia), especially if you use insulin or take certain medicines for diabetes mellitus. Hypoglycemia can be a life-threatening condition. Symptoms of hypoglycemia (sleepiness, dizziness, and disorientation) are similar to symptoms of having too much alcohol.   If your health care provider has given you approval to drink alcohol, do so in moderation and use the following guidelines:  Women should not have more than one drink per day, and men should not have more than two drinks per day. One drink is equal to:  12 oz of beer.  5 oz of wine.  1 oz of hard liquor.  Do not drink on an empty stomach.  Keep yourself hydrated. Have water, diet soda, or unsweetened iced tea.  Regular soda, juice, and other mixers might contain a lot of carbohydrates and should be counted. WHAT FOODS ARE NOT RECOMMENDED? As you make food choices, it is important to remember that all foods are not the same. Some foods have fewer nutrients per serving than other foods, even though they might have the same number of calories or carbohydrates. It is difficult to get your body what it needs when you eat foods with fewer nutrients. Examples of foods that you should avoid that are high in calories and carbohydrates but low in nutrients include:  Trans fats (most processed foods list trans fats on the Nutrition Facts label).  Regular soda.  Juice.  Candy.  Sweets, such as cake, pie, doughnuts, and cookies.  Fried foods. WHAT FOODS CAN I EAT? Have nutrient-rich foods, which will nourish your body and keep you healthy. The food you should eat also will depend on several factors, including:  The calories you need.  The medicines you take.  Your weight.  Your blood glucose level.  Your blood pressure level.  Your cholesterol level. You also should eat a variety of foods, including:  Protein, such as meat, poultry, fish, tofu, nuts, and seeds (lean animal  proteins are best).  Fruits.  Vegetables.  Dairy products, such as milk, cheese, and yogurt (low fat is best).  Breads, grains, pasta, cereal, rice, and beans.  Fats such as olive oil, trans fat-free margarine, canola oil, avocado, and olives. DOES EVERYONE WITH DIABETES MELLITUS HAVE THE SAME MEAL  PLAN? Because every person with diabetes mellitus is different, there is not one meal plan that works for everyone. It is very important that you meet with a dietitian who will help you create a meal plan that is just right for you. Document Released: 10/04/2004 Document Revised: 01/12/2013 Document Reviewed: 12/04/2012 [facility] Patient Information 2015 [place], [place]. This information is not intended to replace advice given to you by your health care provider. Make sure you discuss any questions you have with your health care provider. Diabetes and Standards of Medical Care Diabetes is complicated. You may find that your diabetes team includes a dietitian, nurse, diabetes educator, eye doctor, and more. To help everyone know what is going on and to help you get the care you deserve, the following schedule of care was developed to help keep you on track. Below are the tests, exams, vaccines, medicines, education, and plans you will need. HbA1c test This test shows how well you have controlled your glucose over the past 2-3 months. It is used to see if your diabetes management plan needs to be adjusted.   It is performed at least 2 times a year if you are meeting treatment goals.  It is performed 4 times a year if therapy has changed or if you are not meeting treatment goals. Blood pressure test  This test is performed at every routine medical visit. The goal is less than 140/90 mm Hg for most people, but 130/80 mm Hg in some cases. Ask your health care provider about your goal. Dental exam  Follow up with the dentist regularly. Eye exam  If you are diagnosed with type 1 diabetes as a child, get an exam upon reaching the age of 24 years or older and have had diabetes for 3-5 years. Yearly eye exams are recommended after that initial eye exam.  If you are diagnosed with type 1 diabetes as an adult, get an exam within 5 years of diagnosis and then yearly.  If you are diagnosed with type 2  diabetes, get an exam as soon as possible after the diagnosis and then yearly. Foot care exam  Visual foot exams are performed at every routine medical visit. The exams check for cuts, injuries, or other problems with the feet.  A comprehensive foot exam should be done yearly. This includes visual inspection as well as assessing foot pulses and testing for loss of sensation.  Check your feet nightly for cuts, injuries, or other problems with your feet. Tell your health care provider if anything is not healing. Kidney function test (urine microalbumin)  This test is performed once a year.  Type 1 diabetes: The first test is performed 5 years after diagnosis.  Type 2 diabetes: The first test is performed at the time of diagnosis.  A serum creatinine and estimated glomerular filtration rate (eGFR) test is done once a year to assess the level of chronic kidney disease (CKD), if present. Lipid profile (cholesterol, HDL, LDL, triglycerides)  Performed every 5 years for most people.  The goal for LDL is less than 100 mg/dL. If you are at high risk, the goal is less than 70 mg/dL.  The goal for HDL is 40 mg/dL-50  mg/dL for men and 50 mg/dL-60 mg/dL for women. An HDL cholesterol of 60 mg/dL or higher gives some protection against heart disease.  The goal for triglycerides is less than 150 mg/dL. Influenza vaccine, pneumococcal vaccine, and hepatitis B vaccine  The influenza vaccine is recommended yearly.  It is recommended that people with diabetes who are over 28 years old get the pneumonia vaccine. In some cases, two separate shots may be given. Ask your health care provider if your pneumonia vaccination is up to date.  The hepatitis B vaccine is also recommended for adults with diabetes. Diabetes self-management education  Education is recommended at diagnosis and ongoing as needed. Treatment plan  Your treatment plan is reviewed at every medical visit. Document Released: 11/04/2008  Document Revised: 05/24/2013 Document Reviewed: 06/09/2012 [facility] Patient Information 2015 [place], [place]. This information is not intended to replace advice given to you by your health care provider. Make sure you discuss any questions you have with your health care provider.

## 2013-12-13 ENCOUNTER — Telehealth: Payer: Self-pay | Admitting: Family Medicine

## 2013-12-13 NOTE — Telephone Encounter (Signed)
We choose this because there is a coupon which he was given at his time of visit - if he lost it, he can come by and pick up another one. If the coupon didn't work, will be happy to call him in viagra instead (but is still $20/pill).

## 2013-12-13 NOTE — Telephone Encounter (Signed)
Patient called back regarding his script. Gave patient the message from Dr. Brigitte Pulse (see below). Patient said he still has the coupon and will try to use it.

## 2013-12-13 NOTE — Telephone Encounter (Signed)
Avanafil 200 MG TABS [[id number]]  Pt would like something else called in to his pharmacy to replace this medication. Please advise.

## 2013-12-13 NOTE — Telephone Encounter (Signed)
Patient states that he went to his pharmacy to pick up his prescription and it was $1,000. When asked which medication it was patient stated that he did not know. Patient wants something cheaper. [place].   [phone]

## 2013-12-27 NOTE — Progress Notes (Signed)
LMVM for pt to CB to schedule appt with Dr. Brigitte Pulse in 3-4 months for a diabetic recheck.

## 2013-12-30 ENCOUNTER — Encounter (HOSPITAL_COMMUNITY): Payer: Self-pay | Admitting: Cardiology

## 2014-01-03 ENCOUNTER — Telehealth: Payer: Self-pay

## 2014-01-03 NOTE — Telephone Encounter (Signed)
LMVM reminding patient to get his flu shot. 

## 2014-01-07 ENCOUNTER — Telehealth: Payer: Self-pay

## 2014-01-07 NOTE — Telephone Encounter (Signed)
Pt would like to have something called in for constipation, he believes this is caused by the medications he has been taking.  [phone]

## 2014-01-07 NOTE — Telephone Encounter (Signed)
He can try Miralax over the counter and make sure he is drinking enough water. He states he is also having sinus pain/ pressure with no relief with Mucinex and nasal sprays. I have advised him to come in for the sinuses/ he states he will come in this weekend. To you FYI

## 2014-01-07 NOTE — Telephone Encounter (Signed)
Perfect thanks

## 2014-04-27 ENCOUNTER — Ambulatory Visit (INDEPENDENT_AMBULATORY_CARE_PROVIDER_SITE_OTHER): Payer: Medicare Other | Admitting: Family Medicine

## 2014-04-27 VITALS — BP 124/82 | HR 71 | Temp 97.4°F | Resp 17 | Ht 65.25 in | Wt 144.6 lb

## 2014-04-27 DIAGNOSIS — J3089 Other allergic rhinitis: Secondary | ICD-10-CM | POA: Diagnosis not present

## 2014-04-27 DIAGNOSIS — G8929 Other chronic pain: Secondary | ICD-10-CM | POA: Diagnosis not present

## 2014-04-27 DIAGNOSIS — M79605 Pain in left leg: Secondary | ICD-10-CM

## 2014-04-27 DIAGNOSIS — I10 Essential (primary) hypertension: Secondary | ICD-10-CM

## 2014-04-27 DIAGNOSIS — R809 Proteinuria, unspecified: Secondary | ICD-10-CM

## 2014-04-27 DIAGNOSIS — E119 Type 2 diabetes mellitus without complications: Secondary | ICD-10-CM

## 2014-04-27 DIAGNOSIS — M545 Low back pain: Secondary | ICD-10-CM

## 2014-04-27 LAB — LIPID PANEL
Cholesterol: 158 mg/dL (ref 0–200)
HDL: 36 mg/dL — ABNORMAL LOW (ref >=40)
LDL Cholesterol: 104 mg/dL — ABNORMAL HIGH (ref 0–99)
TRIGLYCERIDES: 92 mg/dL (ref <150)
Total CHOL/HDL Ratio: 4.4 Ratio
VLDL: 18 mg/dL (ref 0–40)

## 2014-04-27 MED ORDER — HYDROCODONE-ACETAMINOPHEN 10-325 MG PO TABS: 1.0000 | ORAL_TABLET | Freq: Four times a day (QID) | ORAL | Status: DC | PRN

## 2014-04-27 MED ORDER — GABAPENTIN 300 MG PO CAPS: 900.0000 mg | ORAL_CAPSULE | Freq: Three times a day (TID) | ORAL | Status: DC

## 2014-04-27 MED ORDER — AMLODIPINE BESYLATE 10 MG PO TABS: 10.0000 mg | ORAL_TABLET | Freq: Every day | ORAL | Status: DC

## 2014-04-27 MED ORDER — METFORMIN HCL 500 MG PO TABS
500.0000 mg | ORAL_TABLET | Freq: Two times a day (BID) | ORAL | Status: DC
Start: 2014-04-27 — End: 2014-07-17

## 2014-04-27 MED ORDER — FLUTICASONE PROPIONATE 50 MCG/ACT NA SUSP: 2.0000 | Freq: Every day | NASAL | Status: DC | PRN

## 2014-04-27 NOTE — Progress Notes (Addendum)
[facility] [address], [place] [address] [phone]  Date:  04/27/2014   Name:  Alexander [name]   DOB:  23-Dec-1945   MRN:  [id number]  PCP:  Robyn Haber, MD    Chief Complaint: Medication Refills   History of Present Illness:  Alexander [name] is a 69 y.o. very pleasant male patient who presents with the following:  He was last here in November to discuss his chronic health conditions and his back pain.  He has a history of chronic back pain He notes that taking the norco keeps his "awake all the night."  He may use one every 2 weeks or so.  He generally takes it only at night.  He notes that if he takes it at 5 or 6 in the evening he may wake up frequently during the night. However it does seem to help with the pain.    He takes flexeril as needed also for leg cramps.  He takes his gabapentin 2-3 x a day for chronic back pain.    He has history of DM but states "I don't know about" if he carries this dx.   He lives with his oldest daughter.    Lab Results  Component Value Date   HGBA1C 7.1 12/09/2013   He takes metformin twice a day.   He is fasting today He declines a pneumonia shot today  Patient Active Problem List   Diagnosis Date Noted  . Tobacco use disorder 12/09/2013  . 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 Erectile dysfunction due to diseases classified elsewhere 12/09/2013  . Type 2 diabetes mellitus with other circulatory complications 30/09/2328  . Preoperative cardiovascular examination 09/08/2013  . Left lumbar radiculopathy 09/30/2012  . Allergic rhinitis 04/08/2011  . Hypertension   . HTN (hypertension), benign 03/09/2011  . History of prostate cancer 03/09/2011    Past Medical History  Diagnosis Date  . Cancer     prostate  . Hypertension   . Neuromuscular disorder     Past Surgical History  Procedure Laterality Date  . Prostate surgery   2005  . Hernia repair    . Colon surgery      "locked bowel" fixed  . Left heart catheterization with coronary angiogram N/A 09/15/2013    Procedure: LEFT HEART CATHETERIZATION WITH CORONARY ANGIOGRAM;  Surgeon: Peter M Martinique, MD;  Location: [facility] CATH LAB;  Service: Cardiovascular;  Laterality: N/A;    History  Substance Use Topics  . Smoking status: Current Every Day Smoker -- 1.00 packs/day for 51 years    Types: Cigarettes  . Smokeless tobacco: Never Used  . Alcohol Use: No    Family History  Problem Relation Age of Onset  . Diabetes Mother   . Depression Father   . Hyperlipidemia Sister     No Known Allergies  Medication list has been reviewed and updated.  Current Outpatient Prescriptions on File Prior to Visit  Medication Sig Dispense Refill  . amLODipine (NORVASC) 10 MG tablet Take 1 tablet (10 mg total) by mouth daily. 90 tablet 1  . aspirin 81 MG tablet Take 81 mg by mouth daily.    . Avanafil 200 MG TABS Take 100 mg by mouth once. 15-30 min before sexual activity. Do not repeat dose within 24 hours 30 tablet 0  . cyclobenzaprine (FLEXERIL) 10 MG tablet Take 1 tablet (10 mg total) by mouth 3 times/day as needed-between  meals & bedtime for muscle spasms. 30 tablet 2  . fluticasone (FLONASE) 50 MCG/ACT nasal spray Place 2 sprays into both nostrils daily as needed for allergies.    Marland Kitchen gabapentin (NEURONTIN) 300 MG capsule Take 3 capsules (900 mg total) by mouth 3 (three) times daily. 270 capsule 1  . HYDROcodone-acetaminophen (NORCO) 10-325 MG per tablet Take 1 tablet by mouth every 6 (six) hours as needed for moderate pain. 60 tablet 0  . metFORMIN (GLUCOPHAGE) 500 MG tablet Take 1 tablet (500 mg total) by mouth 2 (two) times daily with a meal. 180 tablet 1  . Blood Glucose Monitoring Suppl W/DEVICE KIT 1 each by Does not apply route daily. (Patient not taking: Reported on 04/27/2014) 100 each 3   No current facility-administered medications on file prior to visit.     Review of Systems:  As per HPI- otherwise negative.   Physical Examination: Filed Vitals:   04/27/14 1021  BP: 124/82  Pulse: 71  Temp: 97.4 F (36.3 C)  Resp: 17   Filed Vitals:   04/27/14 1021  Height: 5' 5.25" (1.657 m)  Weight: 144 lb 9.6 oz (65.59 kg)   Body mass index is 23.89 kg/(m^2). Ideal Body Weight:   GEN: WDWN, NAD, Non-toxic, A & O x 3, normal weight, looks well HEENT: Atraumatic, Normocephalic. Neck supple. No masses, No LAD.  Bilateral TM wnl, oropharynx normal.  PEERL,EOMI.   Muddy sclerae Ears and Nose: No external deformity. CV: RRR, No M/G/R. No JVD. No thrill. No extra heart sounds. PULM: CTA B, no wheezes, crackles, rhonchi. No retractions. No resp. distress. No accessory muscle use. He notes the mid lower back as the site of his pain. Normal BLE strength, sensation and DTR EXTR: No c/c/e NEURO Normal gait.  PSYCH: Normally interactive. Conversant. Not depressed or anxious appearing.  Calm demeanor.  Foot exam done today  Assessment and Plan: Left leg pain - Plan: gabapentin (NEURONTIN) 300 MG capsule  Essential hypertension - Plan: amLODipine (NORVASC) 10 MG tablet  Diabetes mellitus type 2, controlled - Plan: metFORMIN (GLUCOPHAGE) 500 MG tablet, Lipid panel, Microalbumin, urine, Hemoglobin A1c  Other allergic rhinitis - Plan: fluticasone (FLONASE) 50 MCG/ACT nasal spray  Chronic lower back pain - Plan: HYDROcodone-acetaminophen (NORCO) 10-325 MG per tablet  Updated some of his DM care today as above.  He declines recommended flu shot and pneumonia shot. Await his labs, refilled metformin Refilled his norvasc, gabapentin and flonase today.  Ideally would have him on an ace or arb, but he is someone hesitant to take steps towards treating his DM so will not push him too fast Discussed norco use.  It does work for his pain although he does not sleep well after taking it.  However he is willing to keep using this for occasional pain as this is  not really that bothersome See patient instructions for more details.    Signed Lamar Blinks, MD  Called with labs 4/11.  Overall good, A1c is acceptable. Would like to start a low dose of lisinopril for his microalbuminuria.  He is ok with this- will send in to his drug store.  Plan to recheck in 4 months   Results for orders placed or performed in visit on 04/27/14  Lipid panel  Result Value Ref Range   Cholesterol 158 0 - 200 mg/dL   Triglycerides 92 <150 mg/dL   HDL 36 (L) >=40 mg/dL   Total CHOL/HDL Ratio 4.4 Ratio   VLDL 18 0 - 40 mg/dL  LDL Cholesterol 104 (H) 0 - 99 mg/dL  Microalbumin, urine  Result Value Ref Range   Microalb, Ur 2.3 (H) <2.0 mg/dL  Hemoglobin A1c  Result Value Ref Range   Hgb A1c MFr Bld 7.1 (H) <5.7 %   Mean Plasma Glucose 157 (H) <117 mg/dL

## 2014-04-27 NOTE — Patient Instructions (Signed)
Good to see you today.  I will be in touch with your labs asap  As a diabetic, there are several things you can do to monitor your condition and maintain your health.  1. Check your feet daily for any skin breakdown 2. Exercise and keep track of your diet 3. Let us know before you run out of your medications 4. Get your annual flu shot, and ask if you need a pneumonia shot 5. Ask if you are up to date on your labs; you should have an A1c every 6 months, a urine protein test annually, and a cholesterol test annually.  Your doctor may decide to do labs more often if indicated 6. Take off your shoes and socks at each visit.  Be sure your doctor examines your feet.   7. Ask about your blood pressure.  Your goal is 130/ 80 or less 8. Get an annual eye exam.  Please ask your ophthalmologist to send [place] your report 9. Keep up with your dental cleanings and exams.     I still do encourage you to get a pneumonia shot

## 2014-04-28 LAB — HEMOGLOBIN A1C
Hgb A1c MFr Bld: 7.1 % — ABNORMAL HIGH (ref <5.7)
Mean Plasma Glucose: 157 mg/dL — ABNORMAL HIGH (ref <117)

## 2014-04-28 LAB — MICROALBUMIN, URINE: Microalb, Ur: 2.3 mg/dL — ABNORMAL HIGH (ref <2.0)

## 2014-05-02 ENCOUNTER — Encounter: Payer: Self-pay | Admitting: Family Medicine

## 2014-05-02 MED ORDER — LISINOPRIL 2.5 MG PO TABS: 2.5000 mg | ORAL_TABLET | Freq: Every day | ORAL | Status: DC

## 2014-05-02 NOTE — Addendum Note (Signed)
Addended by: Lamar Blinks C on: 05/02/2014 03:11 PM   Modules accepted: Orders

## 2014-07-17 ENCOUNTER — Ambulatory Visit (INDEPENDENT_AMBULATORY_CARE_PROVIDER_SITE_OTHER): Payer: Medicare Other | Admitting: Family Medicine

## 2014-07-17 VITALS — BP 128/68 | HR 88 | Temp 98.3°F | Resp 16 | Ht 65.0 in | Wt 138.8 lb

## 2014-07-17 DIAGNOSIS — E119 Type 2 diabetes mellitus without complications: Secondary | ICD-10-CM

## 2014-07-17 DIAGNOSIS — Z23 Encounter for immunization: Secondary | ICD-10-CM

## 2014-07-17 DIAGNOSIS — J3089 Other allergic rhinitis: Secondary | ICD-10-CM

## 2014-07-17 LAB — BASIC METABOLIC PANEL
BUN: 14 mg/dL (ref 6–23)
CO2: 23 mEq/L (ref 19–32)
Calcium: 9.2 mg/dL (ref 8.4–10.5)
Chloride: 106 mEq/L (ref 96–112)
Creat: 0.9 mg/dL (ref 0.50–1.35)
GLUCOSE: 129 mg/dL — AB (ref 70–99)
Potassium: 3.9 mEq/L (ref 3.5–5.3)
Sodium: 138 mEq/L (ref 135–145)

## 2014-07-17 LAB — POCT GLYCOSYLATED HEMOGLOBIN (HGB A1C): HEMOGLOBIN A1C: 6.6

## 2014-07-17 MED ORDER — FLUTICASONE PROPIONATE 50 MCG/ACT NA SUSP: 2.0000 | Freq: Every day | NASAL | Status: DC | PRN

## 2014-07-17 MED ORDER — METFORMIN HCL 500 MG PO TABS: 500.0000 mg | ORAL_TABLET | Freq: Two times a day (BID) | ORAL | Status: DC

## 2014-07-17 NOTE — Patient Instructions (Addendum)
Your A1c looks good- your diabetes is under fine control!  Continue taking your current regimen of metformin  We will get you set up to see an eye doctor Please see me in about 6 months for a recheck.

## 2014-07-17 NOTE — Progress Notes (Signed)
[facility] [address], [place] [address] [phone]  Date:  07/17/2014   Name:  Alexander [name]   DOB:  [birth year]   MRN:  [id number]  PCP:  Robyn Haber, MD    Chief Complaint: Medication Problem and Medication Refill   History of Present Illness:  Alexander [name] is a 69 y.o. very pleasant male patient who presents with the following:  Here today for a follow-up exam.  He was noted to have an A1c of 7.1 in April, started on Metformin which he is tolerating well.  Here today for a recheck and for refills of his medication He does not have any particular complaints today except for occasional leg cramps  Patient Active Problem List   Diagnosis Date Noted  . Tobacco use disorder 12/09/2013  . 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 Erectile dysfunction due to diseases classified elsewhere 12/09/2013  . Type 2 diabetes mellitus with other circulatory complications 49/67/5916  . Preoperative cardiovascular examination 09/08/2013  . Left lumbar radiculopathy 09/30/2012  . Allergic rhinitis 04/08/2011  . Hypertension   . HTN (hypertension), benign 03/09/2011  . History of prostate cancer 03/09/2011    Past Medical History  Diagnosis Date  . Cancer     prostate  . Hypertension   . Neuromuscular disorder     Past Surgical History  Procedure Laterality Date  . Prostate surgery  2005  . Hernia repair    . Colon surgery      "locked bowel" fixed  . Left heart catheterization with coronary angiogram N/A 09/15/2013    Procedure: LEFT HEART CATHETERIZATION WITH CORONARY ANGIOGRAM;  Surgeon: Peter M Martinique, MD;  Location: [facility] CATH LAB;  Service: Cardiovascular;  Laterality: N/A;    History  Substance Use Topics  . Smoking status: Current Every Day Smoker -- 1.00 packs/day for 51 years    Types: Cigarettes  . Smokeless tobacco: Never Used  . Alcohol Use: No    Family  History  Problem Relation Age of Onset  . Diabetes Mother   . Depression Father   . Hyperlipidemia Sister     No Known Allergies  Medication list has been reviewed and updated.  Current Outpatient Prescriptions on File Prior to Visit  Medication Sig Dispense Refill  . amLODipine (NORVASC) 10 MG tablet Take 1 tablet (10 mg total) by mouth daily. 90 tablet 1  . aspirin 81 MG tablet Take 81 mg by mouth daily.    . Avanafil 200 MG TABS Take 100 mg by mouth once. 15-30 min before sexual activity. Do not repeat dose within 24 hours 30 tablet 0  . Blood Glucose Monitoring Suppl W/DEVICE KIT 1 each by Does not apply route daily. 100 each 3  . fluticasone (FLONASE) 50 MCG/ACT nasal spray Place 2 sprays into both nostrils daily as needed for allergies. 16 g 6  . gabapentin (NEURONTIN) 300 MG capsule Take 3 capsules (900 mg total) by mouth 3 (three) times daily. 270 capsule 1  . HYDROcodone-acetaminophen (NORCO) 10-325 MG per tablet Take 1 tablet by mouth every 6 (six) hours as needed for moderate pain. 60 tablet 0  . lisinopril (PRINIVIL,ZESTRIL) 2.5 MG tablet Take 1 tablet (2.5 mg total) by mouth daily. 30 tablet 6  . metFORMIN (GLUCOPHAGE) 500 MG tablet Take 1 tablet (500 mg total) by mouth 2 (two) times daily with a meal. 180 tablet 1  . cyclobenzaprine (FLEXERIL) 10 MG  tablet Take 1 tablet (10 mg total) by mouth 3 times/day as needed-between meals & bedtime for muscle spasms. (Patient not taking: Reported on 07/17/2014) 30 tablet 2   No current facility-administered medications on file prior to visit.    Review of Systems:  As per HPI- otherwise negative.   Physical Examination: Filed Vitals:   07/17/14 0822  BP: 128/68  Pulse: 88  Temp: 98.3 F (36.8 C)  Resp: 16   Filed Vitals:   07/17/14 0822  Height: 5' 5" (1.651 m)  Weight: 138 lb 12.8 oz (62.959 kg)   Body mass index is 23.1 kg/(m^2). Ideal Body Weight: Weight in (lb) to have BMI = 25: 149.9  GEN: WDWN, NAD, Non-toxic,  A & O x 3, slim build, looks well HEENT: Atraumatic, Normocephalic. Neck supple. No masses, No LAD. Ears and Nose: No external deformity. CV: RRR, No M/G/R. No JVD. No thrill. No extra heart sounds. PULM: CTA B, no wheezes, crackles, rhonchi. No retractions. No resp. distress. No accessory muscle use. EXTR: No c/c/e NEURO Normal gait.  PSYCH: Normally interactive. Conversant. Not depressed or anxious appearing.  Calm demeanor.   Results for orders placed or performed in visit on 52/07/61  Basic metabolic panel  Result Value Ref Range   Sodium 138 135 - 145 mEq/L   Potassium 3.9 3.5 - 5.3 mEq/L   Chloride 106 96 - 112 mEq/L   CO2 23 19 - 32 mEq/L   Glucose, Bld 129 (H) 70 - 99 mg/dL   BUN 14 6 - 23 mg/dL   Creat 0.90 0.50 - 1.35 mg/dL   Calcium 9.2 8.4 - 10.5 mg/dL  POCT glycosylated hemoglobin (Hb A1C)  Result Value Ref Range   Hemoglobin A1C 6.6     Declines a pneumonia shot today, but is willing to get a tetanus shot  Assessment and Plan: Diabetes mellitus type 2, controlled - Plan: POCT glycosylated hemoglobin (Hb N1J), Basic metabolic panel, metFORMIN (GLUCOPHAGE) 500 MG tablet, Ambulatory referral to Ophthalmology  Other allergic rhinitis - Plan: fluticasone (FLONASE) 50 MCG/ACT nasal spray  Immunization due - Plan: Td vaccine greater than or equal to 7yo preservative free IM  A1c under good control today, continue metformin.  Refilled his flonase as well He declines pneumonia shot, was willing to get tetanus shot and optho referral Asked him to follow-up in 6 months   Signed Lamar Blinks, MD

## 2014-07-18 ENCOUNTER — Other Ambulatory Visit: Payer: Self-pay

## 2014-07-20 ENCOUNTER — Other Ambulatory Visit: Payer: Self-pay | Admitting: Physician Assistant

## 2014-07-20 MED ORDER — CYCLOBENZAPRINE HCL 10 MG PO TABS: 10.0000 mg | ORAL_TABLET | Freq: Two times a day (BID) | ORAL | Status: DC | PRN

## 2014-08-09 DIAGNOSIS — H25813 Combined forms of age-related cataract, bilateral: Secondary | ICD-10-CM | POA: Diagnosis not present

## 2014-08-09 DIAGNOSIS — H35372 Puckering of macula, left eye: Secondary | ICD-10-CM | POA: Diagnosis not present

## 2014-08-09 DIAGNOSIS — E119 Type 2 diabetes mellitus without complications: Secondary | ICD-10-CM | POA: Diagnosis not present

## 2014-10-24 ENCOUNTER — Ambulatory Visit (INDEPENDENT_AMBULATORY_CARE_PROVIDER_SITE_OTHER): Payer: Medicare Other | Admitting: Family Medicine

## 2014-10-24 VITALS — BP 122/76 | HR 64 | Temp 97.8°F | Resp 16 | Ht 65.0 in | Wt 139.0 lb

## 2014-10-24 DIAGNOSIS — J302 Other seasonal allergic rhinitis: Secondary | ICD-10-CM

## 2014-10-24 DIAGNOSIS — E118 Type 2 diabetes mellitus with unspecified complications: Secondary | ICD-10-CM | POA: Diagnosis not present

## 2014-10-24 DIAGNOSIS — M545 Low back pain: Secondary | ICD-10-CM | POA: Diagnosis not present

## 2014-10-24 DIAGNOSIS — G8929 Other chronic pain: Secondary | ICD-10-CM | POA: Diagnosis not present

## 2014-10-24 DIAGNOSIS — R252 Cramp and spasm: Secondary | ICD-10-CM | POA: Diagnosis not present

## 2014-10-24 LAB — COMPREHENSIVE METABOLIC PANEL
ALT: 8 U/L — ABNORMAL LOW (ref 9–46)
AST: 13 U/L (ref 10–35)
Albumin: 4.2 g/dL (ref 3.6–5.1)
Alkaline Phosphatase: 104 U/L (ref 40–115)
BUN: 10 mg/dL (ref 7–25)
CHLORIDE: 102 mmol/L (ref 98–110)
CO2: 24 mmol/L (ref 20–31)
Calcium: 9.5 mg/dL (ref 8.6–10.3)
Creat: 0.78 mg/dL (ref 0.70–1.25)
GLUCOSE: 130 mg/dL — AB (ref 65–99)
Potassium: 4.4 mmol/L (ref 3.5–5.3)
Sodium: 138 mmol/L (ref 135–146)
Total Bilirubin: 0.3 mg/dL (ref 0.2–1.2)
Total Protein: 7.3 g/dL (ref 6.1–8.1)

## 2014-10-24 LAB — CBC
HCT: 43.3 % (ref 39.0–52.0)
Hemoglobin: 14.9 g/dL (ref 13.0–17.0)
MCH: 30.3 pg (ref 26.0–34.0)
MCHC: 34.4 g/dL (ref 30.0–36.0)
MCV: 88 fL (ref 78.0–100.0)
MPV: 10.5 fL (ref 8.6–12.4)
PLATELETS: 282 10*3/uL (ref 150–400)
RBC: 4.92 MIL/uL (ref 4.22–5.81)
RDW: 14.4 % (ref 11.5–15.5)
WBC: 8.4 10*3/uL (ref 4.0–10.5)

## 2014-10-24 LAB — HEMOGLOBIN A1C
Hgb A1c MFr Bld: 6.9 % — ABNORMAL HIGH (ref <5.7)
Mean Plasma Glucose: 151 mg/dL — ABNORMAL HIGH (ref <117)

## 2014-10-24 MED ORDER — MONTELUKAST SODIUM 10 MG PO TABS: 10.0000 mg | ORAL_TABLET | Freq: Every day | ORAL | Status: DC

## 2014-10-24 MED ORDER — HYDROCODONE-ACETAMINOPHEN 10-325 MG PO TABS: 1.0000 | ORAL_TABLET | Freq: Four times a day (QID) | ORAL | Status: DC | PRN

## 2014-10-24 MED ORDER — CYCLOBENZAPRINE HCL 10 MG PO TABS: 10.0000 mg | ORAL_TABLET | Freq: Two times a day (BID) | ORAL | Status: DC | PRN

## 2014-10-24 NOTE — Patient Instructions (Addendum)
I will check your labs to look for any cause of your leg cramping I refilled your pain medication today- continue to use sparingly as needed for severe pain Use the muscle relaxer as needed for cramps and pain- remember both this and the pain pill can make you sleepy  Use the singulair once a day for your allergies and sinus. If this is not helping in the next few days let me know- Sooner if worse.  I will also check on your diabetes today

## 2014-10-24 NOTE — Progress Notes (Signed)
[facility] [address], [place] [address] [phone]  Date:  10/24/2014   Name:  Alexander [name]   DOB:  1945-06-03   MRN:  [id number]  PCP:  Robyn Haber, MD    Chief Complaint: Sinus Problem; foot cramps; and Medication Refill   History of Present Illness:  Alexander [name] is a 69 y.o. very pleasant male patient who presents with the following:  Here today for a few issues He has noted "cold and sinus" for a couple of week with runny nose, sniffling and coughing. He has not noted a fever, the cough is sometimes productive No aches or flu- like sx- overall he feels ok.  He is using his steroid nasal spray Occasional sneezing.   He has also noted cramping in his feet- more the right but also some on the left He is on gabapentin and wonders if that might relate to this  last fill of hydrocodone 10 was for #60 in April of this year   He uses the hydrocodone on occasion for back pain, also flexeril  Lab Results  Component Value Date   HGBA1C 6.6 07/17/2014   DM has been under good control  Patient Active Problem List   Diagnosis Date Noted  . Tobacco use disorder 12/09/2013  . 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 Erectile dysfunction due to diseases classified elsewhere 12/09/2013  . Type 2 diabetes mellitus with other circulatory complications ([place]) 97/41/6384  . Preoperative cardiovascular examination 09/08/2013  . Left lumbar radiculopathy 09/30/2012  . Allergic rhinitis 04/08/2011  . Hypertension   . HTN (hypertension), benign 03/09/2011  . History of prostate cancer 03/09/2011    Past Medical History  Diagnosis Date  . Cancer [facility])     prostate  . Hypertension   . Neuromuscular disorder [facility])     Past Surgical History  Procedure Laterality Date  . Prostate surgery  2005  . Hernia repair    . Colon surgery      "locked bowel" fixed  . Left heart  catheterization with coronary angiogram N/A 09/15/2013    Procedure: LEFT HEART CATHETERIZATION WITH CORONARY ANGIOGRAM;  Surgeon: Peter M Martinique, MD;  Location: [facility] CATH LAB;  Service: Cardiovascular;  Laterality: N/A;    Social History  Substance Use Topics  . Smoking status: Current Every Day Smoker -- 1.00 packs/day for 51 years    Types: Cigarettes  . Smokeless tobacco: Never Used  . Alcohol Use: No    Family History  Problem Relation Age of Onset  . Diabetes Mother   . Depression Father   . Hyperlipidemia Sister     No Known Allergies  Medication list has been reviewed and updated.  Current Outpatient Prescriptions on File Prior to Visit  Medication Sig Dispense Refill  . amLODipine (NORVASC) 10 MG tablet Take 1 tablet (10 mg total) by mouth daily. 90 tablet 1  . aspirin 81 MG tablet Take 81 mg by mouth daily.    . Avanafil 200 MG TABS Take 100 mg by mouth once. 15-30 min before sexual activity. Do not repeat dose within 24 hours 30 tablet 0  . Blood Glucose Monitoring Suppl W/DEVICE KIT 1 each by Does not apply route daily. 100 each 3  . cyclobenzaprine (FLEXERIL) 10 MG tablet Take 1 tablet (10 mg total) by mouth 3 times/day as needed-between meals & bedtime for muscle spasms. 30 tablet 2  . fluticasone (FLONASE)  50 MCG/ACT nasal spray Place 2 sprays into both nostrils daily as needed for allergies. 16 g 6  . gabapentin (NEURONTIN) 300 MG capsule Take 3 capsules (900 mg total) by mouth 3 (three) times daily. 270 capsule 1  . HYDROcodone-acetaminophen (NORCO) 10-325 MG per tablet Take 1 tablet by mouth every 6 (six) hours as needed for moderate pain. 60 tablet 0  . lisinopril (PRINIVIL,ZESTRIL) 2.5 MG tablet Take 1 tablet (2.5 mg total) by mouth daily. 30 tablet 6  . metFORMIN (GLUCOPHAGE) 500 MG tablet Take 1 tablet (500 mg total) by mouth 2 (two) times daily with a meal. 180 tablet 3   No current facility-administered medications on file prior to visit.    Review of  Systems:  As per HPI- otherwise negative.   Physical Examination: Filed Vitals:   10/24/14 1410  BP: 122/76  Pulse: 64  Temp: 97.8 F (36.6 C)  Resp: 16   Filed Vitals:   10/24/14 1410  Height: _0  (1.651 m)  Weight: 139 lb (63.05 kg)   Body mass index is 23.13 kg/(m^2). Ideal Body Weight: Weight in (lb) to have BMI = 25: 149.9  GEN: WDWN, NAD, Non-toxic, A & O x 3, looks well HEENT: Atraumatic, Normocephalic. Neck supple. No masses, No LAD.  Bilateral TM wnl, oropharynx normal.  PEERL,EOMI.  Ears and Nose: No external deformity. CV: RRR, No M/G/R. No JVD. No thrill. No extra heart sounds. PULM: CTA B, no wheezes, crackles, rhonchi. No retractions. No resp. distress. No accessory muscle use. EXTR: No c/c/e NEURO Normal gait.  PSYCH: Normally interactive. Conversant. Not depressed or anxious appearing.  Calm demeanor.  Foot exam:     Assessment and Plan: Controlled type 2 diabetes mellitus with complication, without long-term current use of insulin (HCC) - Plan: CBC, Comprehensive metabolic panel, Hemoglobin A1c  Cramp of both lower extremities - Plan: cyclobenzaprine (FLEXERIL) 10 MG tablet  Chronic lower back pain - Plan: HYDROcodone-acetaminophen (NORCO) 10-325 MG tablet  Other seasonal allergic rhinitis - Plan: montelukast (SINGULAIR) 10 MG tablet  Refilled the hydrocodone that he uses on occasion for back pain, also refilled his flexeril Will add singulair to see if it will help with likely allergic rhinitis sx He will let me know if not better soon Check labs to look for any reason for foot cramping- he will continue his gabapentin Await A1c also    Signed Lamar Blinks, MD

## 2015-01-04 ENCOUNTER — Other Ambulatory Visit: Payer: Self-pay | Admitting: Family Medicine

## 2015-01-10 ENCOUNTER — Other Ambulatory Visit: Payer: Self-pay | Admitting: Family Medicine

## 2015-01-12 ENCOUNTER — Ambulatory Visit (INDEPENDENT_AMBULATORY_CARE_PROVIDER_SITE_OTHER): Payer: Medicare Other | Admitting: Family Medicine

## 2015-01-12 VITALS — BP 150/80 | HR 73 | Temp 98.0°F | Resp 16 | Ht 65.0 in | Wt 143.0 lb

## 2015-01-12 DIAGNOSIS — G8929 Other chronic pain: Secondary | ICD-10-CM

## 2015-01-12 DIAGNOSIS — Z119 Encounter for screening for infectious and parasitic diseases, unspecified: Secondary | ICD-10-CM | POA: Diagnosis not present

## 2015-01-12 DIAGNOSIS — M545 Low back pain: Secondary | ICD-10-CM | POA: Diagnosis not present

## 2015-01-12 DIAGNOSIS — J3089 Other allergic rhinitis: Secondary | ICD-10-CM

## 2015-01-12 DIAGNOSIS — E119 Type 2 diabetes mellitus without complications: Secondary | ICD-10-CM | POA: Diagnosis not present

## 2015-01-12 MED ORDER — HYDROCODONE-ACETAMINOPHEN 10-325 MG PO TABS: 1.0000 | ORAL_TABLET | Freq: Four times a day (QID) | ORAL | Status: DC | PRN

## 2015-01-12 MED ORDER — GABAPENTIN 300 MG PO CAPS: 900.0000 mg | ORAL_CAPSULE | Freq: Three times a day (TID) | ORAL | Status: DC

## 2015-01-12 MED ORDER — FLUTICASONE PROPIONATE 50 MCG/ACT NA SUSP: 2.0000 | Freq: Every day | NASAL | Status: DC | PRN

## 2015-01-12 NOTE — Patient Instructions (Signed)
It was good to see you today- I will be in touch with your labs asap  Please try to take your gabapentin regularly The hydrocodone is ok to use for severe pain but it can be addicting- use it only when necessary  Please see [place] in 3-4 months to recheck your diabetes   You can continue to use your nose spray as needed

## 2015-01-12 NOTE — Progress Notes (Signed)
[facility] [address] [place] [address] [phone]  Date:  01/12/2015   Name:  Alexander [name]   DOB:  23-Jan-1945   MRN:  [id number]  PCP:  Robyn Haber, MD    Chief Complaint: Follow-up and Medication Refill   History of Present Illness:  Alexander [name] is a 69 y.o. very pleasant male patient who presents with the following:  Here today for a follow-up and medication refills  Chronic back pain He uses gabapentin for leg cramps,has a history of DM He has generally used hydrocodone for occasional back pain, as well as flexeril I last refilled her hydrocodone in October- I gave him #60 of the 10 mg pills.  He states that he is not out of these but is running low.  If he misses the gabapentin he will have more pain  He declines a pneumonia and flu shot today  He has had back pain since his late 24s.  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 Severe left and mild to moderate right foraminal narrowing is also present at this level.  Diffuse broad-based disk bulge at L5-S1 results in narrowing of the lateral recesses which could impact either descending S1 root.  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 Tobacco use disorder 12/09/2013  . 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 Erectile dysfunction due to diseases classified elsewhere 12/09/2013  . Type 2 diabetes mellitus with  other circulatory complications ([place]) 99/24/2683  . Preoperative cardiovascular examination 09/08/2013  . Left lumbar radiculopathy 09/30/2012  . Allergic rhinitis 04/08/2011  . Hypertension   . HTN (hypertension), benign 03/09/2011  . History of prostate cancer 03/09/2011    Past Medical History  Diagnosis Date  . Cancer [facility])     prostate  . Hypertension   . Neuromuscular disorder [facility])     Past Surgical History  Procedure Laterality Date  . Prostate surgery  2005  . Hernia repair    . Colon surgery      "locked bowel" fixed  . Left heart catheterization with coronary angiogram N/A 09/15/2013    Procedure: LEFT HEART CATHETERIZATION WITH CORONARY ANGIOGRAM;  Surgeon: Peter M Martinique, MD;  Location: [facility] CATH LAB;  Service: Cardiovascular;  Laterality: N/A;    Social History  Substance Use Topics  . Smoking status: Current Every Day Smoker -- 1.00 packs/day for 51 years    Types: Cigarettes  . Smokeless tobacco: Never Used  . Alcohol Use: No    Family History  Problem Relation Age of Onset  . Diabetes Mother   . Depression Father   . Hyperlipidemia Sister     No Known Allergies  Medication list has been reviewed and updated.  Current Outpatient Prescriptions on File Prior to Visit  Medication Sig Dispense Refill  . amLODipine (NORVASC) 10 MG tablet Take 1 tablet (10 mg total) by mouth daily. 90 tablet 1  .  aspirin 81 MG tablet Take 81 mg by mouth daily.    . Avanafil 200 MG TABS Take 100 mg by mouth once. 15-30 min before sexual activity. Do not repeat dose within 24 hours 30 tablet 0  . Blood Glucose Monitoring Suppl W/DEVICE KIT 1 each by Does not apply route daily. 100 each 3  . cyclobenzaprine (FLEXERIL) 10 MG tablet Take 1 tablet (10 mg total) by mouth 3 times/day as needed-between meals & bedtime for muscle spasms. 30 tablet 2  . fluticasone (FLONASE) 50 MCG/ACT nasal spray Place 2 sprays into both nostrils daily as needed for allergies. 16 g 6  . gabapentin  (NEURONTIN) 300 MG capsule Take 3 capsules (900 mg total) by mouth 3 (three) times daily. 270 capsule 1  . gabapentin (NEURONTIN) 300 MG capsule TAKE THREE CAPSULES BY MOUTH THREE TIMES DAILY 270 capsule 3  . HYDROcodone-acetaminophen (NORCO) 10-325 MG tablet Take 1 tablet by mouth every 6 (six) hours as needed for moderate pain. 60 tablet 0  . lisinopril (PRINIVIL,ZESTRIL) 2.5 MG tablet TAKE ONE TABLET BY MOUTH ONCE DAILY 30 tablet 1  . metFORMIN (GLUCOPHAGE) 500 MG tablet Take 1 tablet (500 mg total) by mouth 2 (two) times daily with a meal. 180 tablet 3  . montelukast (SINGULAIR) 10 MG tablet Take 1 tablet (10 mg total) by mouth at bedtime. 30 tablet 3   No current facility-administered medications on file prior to visit.    Review of Systems:  As per HPI- otherwise negative.   Physical Examination: Filed Vitals:   01/12/15 1212  Pulse: 73  Temp: 98 F (36.7 C)  Resp: 16   Filed Vitals:   01/12/15 1212  Height: 5' 5"  (1.651 m)  Weight: 143 lb (64.864 kg)   Body mass index is 23.8 kg/(m^2). Ideal Body Weight: Weight in (lb) to have BMI = 25: 149.9  GEN: WDWN, NAD, Non-toxic, A & O x 3, normal weight, small build HEENT: Atraumatic, Normocephalic. Neck supple. No masses, No LAD. Ears and Nose: No external deformity. CV: RRR, No M/G/R. No JVD. No thrill. No extra heart sounds. PULM: CTA B, no wheezes, crackles, rhonchi. No retractions. No resp. distress. No accessory muscle use. EXTR: No c/c/e NEURO Normal gait.  PSYCH: Normally interactive. Conversant. Not depressed or anxious appearing.  Calm demeanor.  He notes some tenderness and spasm in the left sided paralumbar muscles. Normal spine extension but flexion is a bit restricted.  Normal BLE strength, sensation and DTR.    Assessment and Plan: Chronic lower back pain - Plan: HYDROcodone-acetaminophen (NORCO) 10-325 MG tablet, gabapentin (NEURONTIN) 300 MG capsule  Other allergic rhinitis - Plan: fluticasone (FLONASE) 50  MCG/ACT nasal spray  Controlled type 2 diabetes mellitus without complication, without long-term current use of insulin (HCC) - Plan: Hemoglobin A1c  Screening examination for infectious disease - Plan: Hepatitis C antibody refilled his neurontin and hydrocodone rx Refilled his flonase Check A1c, hep C He declines flu and pneumonia vaccines See patient instructions for more details.      Signed Lamar Blinks, MD

## 2015-01-12 NOTE — Telephone Encounter (Signed)
Patient is coming in to see Dr. Lorelei Pont for med refills.

## 2015-01-13 ENCOUNTER — Encounter: Payer: Self-pay | Admitting: Family Medicine

## 2015-01-13 LAB — HEMOGLOBIN A1C
Hgb A1c MFr Bld: 7 % — ABNORMAL HIGH (ref <5.7)
MEAN PLASMA GLUCOSE: 154 mg/dL — AB (ref <117)

## 2015-01-13 LAB — HEPATITIS C ANTIBODY: HCV Ab: NEGATIVE

## 2015-01-30 ENCOUNTER — Telehealth: Payer: Self-pay

## 2015-01-30 NOTE — Telephone Encounter (Signed)
He has noted mild swelling of his bilateral feet for about one month.  They do not hurt.  He is on amlodipine but this is not a new medication.  Called him and he will come and see me on Friday to check on this BP Readings from Last 3 Encounters:  01/12/15 150/80  10/24/14 122/76  07/17/14 128/68

## 2015-01-30 NOTE — Telephone Encounter (Signed)
Patient is calling to speak Dr. Lorelei Pont. He states that his medication is causing swelling in feet. He isn't sure which medication it is. Please call! [phone]

## 2015-02-03 ENCOUNTER — Ambulatory Visit (INDEPENDENT_AMBULATORY_CARE_PROVIDER_SITE_OTHER): Payer: Medicare Other | Admitting: Family Medicine

## 2015-02-03 VITALS — BP 114/74 | HR 87 | Temp 97.7°F | Resp 18 | Ht 65.0 in | Wt 140.0 lb

## 2015-02-03 DIAGNOSIS — I10 Essential (primary) hypertension: Secondary | ICD-10-CM

## 2015-02-03 DIAGNOSIS — R600 Localized edema: Secondary | ICD-10-CM

## 2015-02-03 MED ORDER — LISINOPRIL 10 MG PO TABS: ORAL_TABLET | ORAL | Status: DC

## 2015-02-03 NOTE — Progress Notes (Signed)
[facility] [address] [place] [address] [phone]  Date:  02/03/2015   Name:  Alexander [name]   DOB:  02-11-1945   MRN:  [id number]  PCP:  Robyn Haber, MD    Chief Complaint: Foot Swelling   History of Present Illness:  Alexander [name] is a 70 y.o. very pleasant male patient who presents with the following:  Here today to discuss foot swelling- this has come and gone over the last 1-2 months, however they are not swollen today.   He does not have any pain.  He will notice that his feet are a bit swollen at the end of the day when he removed his shoes.  He is fine in the morning  He does not have any pain No SOB.  No CP.  No orthopnea Mo recent changes in his labs He does have support hose but does not wear them regularly   He had a low risk heart cath about 18 months ago  Wt Readings from Last 3 Encounters:  02/03/15 140 lb (63.504 kg)  01/12/15 143 lb (64.864 kg)  10/24/14 139 lb (63.05 kg)     Patient Active Problem List   Diagnosis Date Noted  . Tobacco use disorder 12/09/2013  . 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 Erectile dysfunction due to diseases classified elsewhere 12/09/2013  . Type 2 diabetes mellitus with other circulatory complications ([place]) 39/76/7341  . Preoperative cardiovascular examination 09/08/2013  . Left lumbar radiculopathy 09/30/2012  . Allergic rhinitis 04/08/2011  . Hypertension   . HTN (hypertension), benign 03/09/2011  . History of prostate cancer 03/09/2011    Past Medical History  Diagnosis Date  . Cancer [facility])     prostate  . Hypertension   . Neuromuscular disorder [facility])     Past Surgical History  Procedure Laterality Date  . Prostate surgery  2005  . Hernia repair    . Colon surgery      "locked bowel" fixed  . Left heart catheterization with coronary angiogram N/A 09/15/2013    Procedure: LEFT HEART  CATHETERIZATION WITH CORONARY ANGIOGRAM;  Surgeon: Peter M Martinique, MD;  Location: [facility] CATH LAB;  Service: Cardiovascular;  Laterality: N/A;    Social History  Substance Use Topics  . Smoking status: Current Every Day Smoker -- 1.00 packs/day for 51 years    Types: Cigarettes  . Smokeless tobacco: Never Used  . Alcohol Use: No    Family History  Problem Relation Age of Onset  . Diabetes Mother   . Depression Father   . Hyperlipidemia Sister     No Known Allergies  Medication list has been reviewed and updated.  Current Outpatient Prescriptions on File Prior to Visit  Medication Sig Dispense Refill  . amLODipine (NORVASC) 10 MG tablet Take 1 tablet (10 mg total) by mouth daily. 90 tablet 1  . aspirin 81 MG tablet Take 81 mg by mouth daily.    . Avanafil 200 MG TABS Take 100 mg by mouth once. 15-30 min before sexual activity. Do not repeat dose within 24 hours 30 tablet 0  . Blood Glucose Monitoring Suppl W/DEVICE KIT 1 each by Does not apply route daily. 100 each 3  . cyclobenzaprine (FLEXERIL) 10 MG tablet Take 1 tablet (10 mg total) by mouth 3 times/day as needed-between meals & bedtime for muscle spasms. 30 tablet 2  . fluticasone (FLONASE) 50 MCG/ACT nasal spray Place  2 sprays into both nostrils daily as needed for allergies. 16 g 11  . gabapentin (NEURONTIN) 300 MG capsule Take 3 capsules (900 mg total) by mouth 3 (three) times daily. 270 capsule 1  . HYDROcodone-acetaminophen (NORCO) 10-325 MG tablet Take 1 tablet by mouth every 6 (six) hours as needed for moderate pain. 60 tablet 0  . lisinopril (PRINIVIL,ZESTRIL) 2.5 MG tablet TAKE ONE TABLET BY MOUTH ONCE DAILY 30 tablet 1  . metFORMIN (GLUCOPHAGE) 500 MG tablet Take 1 tablet (500 mg total) by mouth 2 (two) times daily with a meal. 180 tablet 3  . montelukast (SINGULAIR) 10 MG tablet Take 1 tablet (10 mg total) by mouth at bedtime. 30 tablet 3   No current facility-administered medications on file prior to visit.     Review of Systems:  As per HPI- otherwise negative.   Physical Examination: Filed Vitals:   02/03/15 1116  BP: 114/74  Pulse: 87  Temp: 97.7 F (36.5 C)  Resp: 18   Filed Vitals:   02/03/15 1116  Height: _0  (1.651 m)  Weight: 140 lb (63.504 kg)   Body mass index is 23.3 kg/(m^2). Ideal Body Weight: Weight in (lb) to have BMI = 25: 149.9  GEN: WDWN, NAD, Non-toxic, A & O x 3, slight build HEENT: Atraumatic, Normocephalic. Neck supple. No masses, No LAD. Ears and Nose: No external deformity. CV: RRR, No M/G/R. No JVD. No thrill. No extra heart sounds. PULM: CTA B, no wheezes, crackles, rhonchi. No retractions. No resp. distress. No accessory muscle use. ABD: S, NT, ND, +BS. No rebound. No HSM. EXTR: No c/c/e NEURO Normal gait.  PSYCH: Normally interactive. Conversant. Not depressed or anxious appearing.  Calm demeanor.  Today he has absolutely no sweling in his feet or legs   Assessment and Plan: Bilateral edema of lower extremity  Essential hypertension - Plan: lisinopril (PRINIVIL,ZESTRIL) 10 MG tablet  Possible LE edema due to amlodipine.  Will stop this medication and increase his lisinopril He will let me know if this does not resolve his sx  Signed Lamar Blinks, MD

## 2015-02-03 NOTE — Patient Instructions (Signed)
It is possible that the amlodipine is causing your foot swelling.  We will stop this medication, and will increase your lisinopril to 10 mg once a day.  If this does not help your swelling I would also suggest that you try using your support socks especially if you will be on your feet a lot during the day If BOTH of these changes do not help please call us or come back in for a visit

## 2015-03-07 ENCOUNTER — Other Ambulatory Visit: Payer: Self-pay | Admitting: Physician Assistant

## 2015-03-12 ENCOUNTER — Encounter (HOSPITAL_COMMUNITY): Payer: Self-pay | Admitting: Emergency Medicine

## 2015-03-12 DIAGNOSIS — F17210 Nicotine dependence, cigarettes, uncomplicated: Secondary | ICD-10-CM | POA: Diagnosis not present

## 2015-03-12 DIAGNOSIS — R220 Localized swelling, mass and lump, head: Secondary | ICD-10-CM | POA: Diagnosis not present

## 2015-03-12 DIAGNOSIS — I10 Essential (primary) hypertension: Secondary | ICD-10-CM | POA: Diagnosis not present

## 2015-03-12 NOTE — ED Notes (Signed)
Pt. reports left lower cheek swelling onset this evening , denies injury , no pain or itching , denies oral swelling/airway intact . Respirations unlabored.

## 2015-03-13 ENCOUNTER — Emergency Department (HOSPITAL_COMMUNITY)
Admission: EM | Admit: 2015-03-13 | Discharge: 2015-03-13 | Disposition: A | Discharge status: Home / Self Care | Payer: Medicare Other | Attending: Emergency Medicine | Admitting: Emergency Medicine

## 2015-03-13 ENCOUNTER — Ambulatory Visit (INDEPENDENT_AMBULATORY_CARE_PROVIDER_SITE_OTHER): Payer: Medicare Other | Admitting: Family Medicine

## 2015-03-13 ENCOUNTER — Encounter (HOSPITAL_COMMUNITY): Payer: Self-pay | Admitting: Emergency Medicine

## 2015-03-13 VITALS — BP 136/84 | HR 82 | Temp 98.0°F | Resp 18 | Ht 65.0 in | Wt 144.0 lb

## 2015-03-13 DIAGNOSIS — F17210 Nicotine dependence, cigarettes, uncomplicated: Secondary | ICD-10-CM | POA: Diagnosis not present

## 2015-03-13 DIAGNOSIS — J029 Acute pharyngitis, unspecified: Secondary | ICD-10-CM | POA: Diagnosis not present

## 2015-03-13 DIAGNOSIS — T783XXA Angioneurotic edema, initial encounter: Secondary | ICD-10-CM | POA: Diagnosis not present

## 2015-03-13 DIAGNOSIS — R1310 Dysphagia, unspecified: Secondary | ICD-10-CM | POA: Insufficient documentation

## 2015-03-13 DIAGNOSIS — Z7984 Long term (current) use of oral hypoglycemic drugs: Secondary | ICD-10-CM | POA: Diagnosis not present

## 2015-03-13 DIAGNOSIS — J209 Acute bronchitis, unspecified: Secondary | ICD-10-CM | POA: Insufficient documentation

## 2015-03-13 DIAGNOSIS — Z7982 Long term (current) use of aspirin: Secondary | ICD-10-CM | POA: Diagnosis not present

## 2015-03-13 DIAGNOSIS — Z8669 Personal history of other diseases of the nervous system and sense organs: Secondary | ICD-10-CM | POA: Insufficient documentation

## 2015-03-13 DIAGNOSIS — B370 Candidal stomatitis: Secondary | ICD-10-CM

## 2015-03-13 DIAGNOSIS — T464X5A Adverse effect of angiotensin-converting-enzyme inhibitors, initial encounter: Secondary | ICD-10-CM

## 2015-03-13 DIAGNOSIS — I10 Essential (primary) hypertension: Secondary | ICD-10-CM | POA: Diagnosis not present

## 2015-03-13 DIAGNOSIS — Z79899 Other long term (current) drug therapy: Secondary | ICD-10-CM | POA: Diagnosis not present

## 2015-03-13 DIAGNOSIS — Z8546 Personal history of malignant neoplasm of prostate: Secondary | ICD-10-CM | POA: Diagnosis not present

## 2015-03-13 MED ORDER — DIPHENHYDRAMINE HCL 50 MG/ML IJ SOLN
25.0000 mg | Freq: Once | INTRAMUSCULAR | Status: AC
  Administered 2015-03-13: 25 mg via INTRAVENOUS
  Filled 2015-03-13: qty 1

## 2015-03-13 MED ORDER — HYDROXYZINE HCL 25 MG PO TABS: 25.0000 mg | ORAL_TABLET | Freq: Three times a day (TID) | ORAL | Status: DC

## 2015-03-13 MED ORDER — NYSTATIN 100000 UNIT/ML MT SUSP: 5.0000 mL | Freq: Four times a day (QID) | OROMUCOSAL | Status: DC

## 2015-03-13 MED ORDER — RANITIDINE HCL 150 MG PO TABS: 150.0000 mg | ORAL_TABLET | Freq: Two times a day (BID) | ORAL | Status: DC

## 2015-03-13 MED ORDER — CETIRIZINE HCL 10 MG PO TABS: 10.0000 mg | ORAL_TABLET | Freq: Every day | ORAL | Status: DC

## 2015-03-13 MED ORDER — DIPHENHYDRAMINE HCL 25 MG PO CAPS: 25.0000 mg | ORAL_CAPSULE | Freq: Four times a day (QID) | ORAL | Status: DC | PRN

## 2015-03-13 NOTE — ED Provider Notes (Signed)
CSN: [id number]     Arrival date & time 03/13/15  9150 History  By signing my name below, I, Alexander [name], attest that this documentation has been prepared under the direction and in the presence of Varney Biles, MD. Electronically Signed: Eustaquio [name], ED Scribe. 03/13/2015. 3:20 AM.   Chief Complaint  Patient presents with  . Angioedema   The history is provided by the patient. No language interpreter was used.     HPI Comments: Alexander [name] is a 70 y.o. male with hx HTN currently on Lisinopril who presents to the Emergency Department complaining of sudden onset, constant, bilateral lip swelling that began at 9 PM tonight (approximately 6 hours ago). Pt also complains of a sore throat and difficulty swallowing. He reports similar reaction in the past but pt does not know what the cause was. Denies shortness of breath, throat itching, throat swelling, or any other associated symptoms.   Past Medical History  Diagnosis Date  . Cancer [facility])     prostate  . Hypertension   . Neuromuscular disorder [facility])    Past Surgical History  Procedure Laterality Date  . Prostate surgery  2005  . Hernia repair    . Colon surgery      "locked bowel" fixed  . Left heart catheterization with coronary angiogram N/A 09/15/2013    Procedure: LEFT HEART CATHETERIZATION WITH CORONARY ANGIOGRAM;  Surgeon: Peter M Martinique, MD;  Location: [facility] CATH LAB;  Service: Cardiovascular;  Laterality: N/A;   Family History  Problem Relation Age of Onset  . Diabetes Mother   . Depression Father   . Hyperlipidemia Sister    Social History  Substance Use Topics  . Smoking status: Current Every Day Smoker -- 0.00 packs/day for 51 years    Types: Cigarettes  . Smokeless tobacco: Never Used  . Alcohol Use: No    Review of Systems  A complete 10 system review of systems was obtained and all systems are negative except as noted in the HPI and PMH.   Allergies  Review of patient's allergies indicates no known  allergies.  Home Medications   Prior to Admission medications   Medication Sig Start Date End Date Taking? Authorizing Provider  aspirin EC 81 MG tablet Take 81 mg by mouth daily.   Yes Historical Provider, MD  Blood Glucose Monitoring Suppl W/DEVICE KIT 1 each by Does not apply route daily. 12/09/13  Yes Shawnee Knapp, MD  cyclobenzaprine (FLEXERIL) 10 MG tablet Take 1 tablet (10 mg total) by mouth 3 times/day as needed-between meals & bedtime for muscle spasms. 10/24/14  Yes Gay Filler Copland, MD  fluticasone (FLONASE) 50 MCG/ACT nasal spray Place 2 sprays into both nostrils daily as needed for allergies. 01/12/15  Yes Gay Filler Copland, MD  gabapentin (NEURONTIN) 300 MG capsule Take 3 capsules (900 mg total) by mouth 3 (three) times daily. Patient taking differently: Take 900 mg by mouth 2 (two) times daily.  01/12/15  Yes Gay Filler Copland, MD  HYDROcodone-acetaminophen (NORCO) 10-325 MG tablet Take 1 tablet by mouth every 6 (six) hours as needed for moderate pain. 01/12/15  Yes Gay Filler Copland, MD  metFORMIN (GLUCOPHAGE) 500 MG tablet Take 1 tablet (500 mg total) by mouth 2 (two) times daily with a meal. 07/17/14  Yes Jessica C Copland, MD  montelukast (SINGULAIR) 10 MG tablet Take 1 tablet (10 mg total) by mouth at bedtime. 10/24/14  Yes Gay Filler Copland, MD  cyclobenzaprine (FLEXERIL) 10 MG tablet TAKE ONE  TABLET BY MOUTH THREE TIMES DAILY AS NEEDED BETWEEN MEALS AND AT BEDTIME FOR MUSCLE SPASMS Patient not taking: Reported on 03/13/2015 03/09/15   Darreld Mclean, MD  diphenhydrAMINE (BENADRYL) 25 mg capsule Take 1 capsule (25 mg total) by mouth every 6 (six) hours as needed for itching. 03/13/15   Varney Biles, MD  ranitidine (ZANTAC) 150 MG tablet Take 1 tablet (150 mg total) by mouth 2 (two) times daily. 03/13/15   Manette Doto, MD   BP 151/93 mmHg  Pulse 80  Temp(Src) 98.7 F (37.1 C) (Oral)  Resp 16  SpO2 91%   Physical Exam  Constitutional: He is oriented to person, place,  and time. He appears well-developed and well-nourished. No distress.  HENT:  Head: Atraumatic.  No oral swelling including no tongue swelling Pt's lip, especially the lower lip is edematous Pt has no drooling and no trismus  Eyes: Conjunctivae and EOM are normal.  Neck: Neck supple. No tracheal deviation present.  Cardiovascular: Normal rate and regular rhythm.   Pulmonary/Chest: Effort normal and breath sounds normal. No stridor. No respiratory distress. He has no wheezes. He has no rales.  Musculoskeletal: Normal range of motion.  Neurological: He is alert and oriented to person, place, and time.  Skin: Skin is warm and dry.  Psychiatric: He has a normal mood and affect. His behavior is normal.  Nursing note and vitals reviewed.   ED Course  Procedures (including critical care time)  DIAGNOSTIC STUDIES: Oxygen Saturation is 100% on RA, normal by my interpretation.    COORDINATION OF CARE: 3:19 AM-Discussed treatment plan with pt at bedside and pt agreed to plan.   Labs Review Labs Reviewed - No data to display  Imaging Review No results found.   EKG Interpretation None      MDM   Final diagnoses:  ACE inhibitor-aggravated angioedema, initial encounter    I personally performed the services described in this documentation, which was scribed in my presence. The recorded information has been reviewed and is accurate.  PT is on ACE and comes in with angioedema. Symptoms started at 9pm, more than 4 hours ago - and have stayed stable. No stridors, drooling, resp distress. Still, we will observe him for 2 more hours to ensure that there is no progression. Pt informed about the need for him to stop lisinopril and seeing his doctor soon.      Varney Biles, MD 03/13/15 [phone]

## 2015-03-13 NOTE — Progress Notes (Signed)
Subjective:    Patient ID: Alexander [name], male    DOB: [birth year], 70 y.o.   MRN: [id number] By signing my name below, I, Alexander [name], attest that this documentation has been prepared under the direction and in the presence of Alexander Cheadle, MD.  Electronically Signed: Zola [name], Medical Scribe. 03/13/2015. 9:10 AM.  Chief Complaint  Patient presents with  . Facial Swelling    since yesterday    HPI HPI Comments: Alexander [name] is a 69 y.o. male with a history of hypertension and DM who presents to the [facility] complaining of lip swelling that started 12 hours ago. Patient went to the ER last night due to the lip swelling. He was also having sore throat and difficulty swallowing and also had a similar reaction in the past. He was on lisinopril and was advised to stop. Observed for 2 hours without progression.   His symptoms initially began on left lower jaw, then progressed to the right side across chin and lower lip. He still has a sore throat and some difficulty swallowing. He had some Benadryl while in the ED which he states did help, and he was sent home on Benadryl and Zantac. Patient denies SOB. He has not had anything to eat or drink today yet. He has a glucometer at home, but has not been checking his blood sugar.  Depression screen [facility] 2/9 03/13/2015 02/03/2015 01/12/2015 10/24/2014 07/17/2014  Decreased Interest 0 0 0 0 0  Down, Depressed, Hopeless 0 0 0 0 0  PHQ - 2 Score 0 0 0 0 0    Past Medical History  Diagnosis Date  . Cancer [facility])     prostate  . Hypertension   . Neuromuscular disorder [facility])    Past Surgical History  Procedure Laterality Date  . Prostate surgery  2005  . Hernia repair    . Colon surgery      "locked bowel" fixed  . Left heart catheterization with coronary angiogram N/A 09/15/2013    Procedure: LEFT HEART CATHETERIZATION WITH CORONARY ANGIOGRAM;  Surgeon: Peter M Martinique, MD;  Location: [facility] CATH LAB;  Service: Cardiovascular;   Laterality: N/A;   Current Outpatient Prescriptions on File Prior to Visit  Medication Sig Dispense Refill  . aspirin EC 81 MG tablet Take 81 mg by mouth daily.    . Blood Glucose Monitoring Suppl W/DEVICE KIT 1 each by Does not apply route daily. 100 each 3  . cyclobenzaprine (FLEXERIL) 10 MG tablet Take 1 tablet (10 mg total) by mouth 3 times/day as needed-between meals & bedtime for muscle spasms. 30 tablet 2  . cyclobenzaprine (FLEXERIL) 10 MG tablet TAKE ONE TABLET BY MOUTH THREE TIMES DAILY AS NEEDED BETWEEN MEALS AND AT BEDTIME FOR MUSCLE SPASMS 90 tablet 3  . diphenhydrAMINE (BENADRYL) 25 mg capsule Take 1 capsule (25 mg total) by mouth every 6 (six) hours as needed for itching. 30 capsule 0  . fluticasone (FLONASE) 50 MCG/ACT nasal spray Place 2 sprays into both nostrils daily as needed for allergies. 16 g 11  . gabapentin (NEURONTIN) 300 MG capsule Take 3 capsules (900 mg total) by mouth 3 (three) times daily. (Patient taking differently: Take 900 mg by mouth 2 (two) times daily. ) 270 capsule 1  . HYDROcodone-acetaminophen (NORCO) 10-325 MG tablet Take 1 tablet by mouth every 6 (six) hours as needed for moderate pain. 60 tablet 0  . metFORMIN (GLUCOPHAGE) 500 MG tablet Take 1 tablet (500 mg total)  by mouth 2 (two) times daily with a meal. 180 tablet 3  . montelukast (SINGULAIR) 10 MG tablet Take 1 tablet (10 mg total) by mouth at bedtime. 30 tablet 3  . [DISCONTINUED] lisinopril (PRINIVIL,ZESTRIL) 10 MG tablet TAKE ONE TABLET BY MOUTH ONCE DAILY 30 tablet 9   No current facility-administered medications on file prior to visit.   No Known Allergies Family History  Problem Relation Age of Onset  . Diabetes Mother   . Depression Father   . Hyperlipidemia Sister    Social History   Social History  . Marital Status: Widowed    Spouse Name: N/A  . Number of Children: 2  . Years of Education: 9   Occupational History  . retired    Social History Main Topics  . Smoking status:  Current Every Day Smoker -- 0.00 packs/day for 51 years    Types: Cigarettes  . Smokeless tobacco: Never Used  . Alcohol Use: No  . Drug Use: No  . Sexual Activity: Yes   Other Topics Concern  . None   Social History Narrative   Patient is single and his daughter lives with him.   Patient has two children.   Patient has a 9th grade education.   Patient works in [employer] (part-time).   Patient drinks one to two cups of coffee daily.   Patient is right-handed.          Review of Systems  Constitutional: Negative for fever and activity change.  HENT: Positive for dental problem, facial swelling, sore throat and trouble swallowing. Negative for congestion, drooling, ear pain, mouth sores, nosebleeds and voice change.   Eyes: Negative for photophobia.  Respiratory: Negative for chest tightness and shortness of breath.   Skin: Negative for color change, pallor, rash and wound.  Neurological: Negative for facial asymmetry, weakness and numbness.  Hematological: Negative for adenopathy.  Psychiatric/Behavioral: Negative for dysphoric mood.       Objective:  BP 136/84 mmHg  Pulse 82  Temp(Src) 98 F (36.7 C)  Resp 18  Ht _0  (1.651 m)  Wt 144 lb (65.318 kg)  BMI 23.96 kg/m2  Physical Exam  Constitutional: He is oriented to person, place, and time. He appears well-developed and well-nourished. No distress.  HENT:  Head: Normocephalic and atraumatic.  Nose: Nose normal.  Mouth/Throat: Posterior oropharyngeal edema present. No oropharyngeal exudate.  Ears and nares normal. Tongue coated with thick, white plaque. Oropharynx with mild edema. Uvula not swollen. Symmetrical palatal rise.  Eyes: Pupils are equal, round, and reactive to light.  Neck: Neck supple.  Left anterior cervical adenopathy, nodes are firm.  Cardiovascular: Normal rate.   Pulmonary/Chest: Effort normal and breath sounds normal. No respiratory distress. He has no wheezes.  Clear to auscultation  bilaterally. Good air movement.  Musculoskeletal: He exhibits no edema.  Lymphadenopathy:    He has cervical adenopathy.  Neurological: He is alert and oriented to person, place, and time. No cranial nerve deficit.  Skin: Skin is warm and dry. No rash noted.  Psychiatric: He has a normal mood and affect. His behavior is normal.  Nursing note and vitals reviewed.         Assessment & Plan:   1. ACE inhibitor-aggravated angioedema, initial encounter   2. Thrush   Seen in ER earlier today, knows to stop lisinopril. Pt with type 2 DM so may need to consider trial of arb if urinary microalb elevated  Meds ordered this encounter  Medications  . nystatin (  MYCOSTATIN) 100000 UNIT/ML suspension    Sig: Take 5 mLs (500,000 Units total) by mouth 4 (four) times daily.    Dispense:  200 mL    Refill:  0  . hydrOXYzine (ATARAX/VISTARIL) 25 MG tablet    Sig: Take 1 tablet (25 mg total) by mouth 3 (three) times daily.    Dispense:  30 tablet    Refill:  0  . cetirizine (ZYRTEC) 10 MG tablet    Sig: Take 1 tablet (10 mg total) by mouth at bedtime.    Dispense:  30 tablet    Refill:  0  . ranitidine (ZANTAC) 150 MG tablet    Sig: Take 1 tablet (150 mg total) by mouth 2 (two) times daily.    Dispense:  60 tablet    Refill:  0    I personally performed the services described in this documentation, which was scribed in my presence. The recorded information has been reviewed and considered, and addended by me as needed.  Alexander Cheadle, MD MPH

## 2015-03-13 NOTE — ED Notes (Signed)
Pt states he has facial/mouth swelling that started tonight around 2200.  [facility] but "got tired of waiting".  States his throat is getting sore.

## 2015-03-13 NOTE — Discharge Instructions (Signed)
See your doctor for change in your BP meds. YOU MUST STOP THE LISINOPRIL.  Return to the ER if you are having difficulty breathing, inability to swallow, drooling, feeling that your throat is closing. Read the information below on angioedema. Take the meds provided.   Angioedema Angioedema is a sudden swelling of tissues, often of the skin. It can occur on the face or genitals or in the abdomen or other body parts. The swelling usually develops over a short period and gets better in 24 to 48 hours. It often begins during the night and is found when the person wakes up. The person may also get red, itchy patches of skin (hives). Angioedema can be dangerous if it involves swelling of the air passages.  Depending on the cause, episodes of angioedema may only happen once, come back in unpredictable patterns, or repeat for several years and then gradually fade away.  CAUSES  Angioedema can be caused by an allergic reaction to various triggers. It can also result from nonallergic causes, including reactions to drugs, immune system disorders, viral infections, or an abnormal gene that is passed to you from your parents (hereditary). For some people with angioedema, the cause is unknown.  Some things that can trigger angioedema include:   Foods.   Medicines, such as ACE inhibitors, ARBs, nonsteroidal anti-inflammatory agents, or estrogen.   Latex.   Animal saliva.   Insect stings.   Dyes used in X-rays.   Mild injury.   Dental work.  Surgery.  Stress.   Sudden changes in temperature.   Exercise. SIGNS AND SYMPTOMS   Swelling of the skin.  Hives. If these are present, there is also intense itching.  Redness in the affected area.   Pain in the affected area.  Swollen lips or tongue.  Breathing problems. This may happen if the air passages swell.  Wheezing. If internal organs are involved, there may be:   Nausea.   Abdominal pain.   Vomiting.    Difficulty swallowing.   Difficulty passing urine. DIAGNOSIS   Your health care provider will examine the affected area and take a medical and family history.  Various tests may be done to help determine the cause. Tests may include:  Allergy skin tests to see if the problem is an allergic reaction.   Blood tests to check for hereditary angioedema.   Tests to check for underlying diseases that could cause the condition.   A review of your medicines, including over-the-counter medicines, may be done. TREATMENT  Treatment will depend on the cause of the angioedema. Possible treatments include:   Removal of anything that triggered the condition (such as stopping certain medicines).   Medicines to treat symptoms or prevent attacks. Medicines given may include:   Antihistamines.   Epinephrine injection.   Steroids.   Hospitalization may be required for severe attacks. If the air passages are affected, it can be an emergency. Tubes may need to be placed to keep the airway open. HOME CARE INSTRUCTIONS   Take all medicines as directed by your health care provider.  If you were given medicines for emergency allergy treatment, always carry them with you.  Wear a medical bracelet as directed by your health care provider.   Avoid known triggers. SEEK MEDICAL CARE IF:   You have repeat attacks of angioedema.   Your attacks are more frequent or more severe despite preventive measures.   You have hereditary angioedema and are considering having children. It is important to discuss with  your health care provider the risks of passing the condition on to your children. SEEK IMMEDIATE MEDICAL CARE IF:   You have severe swelling of the mouth, tongue, or lips.  You have difficulty breathing.   You have difficulty swallowing.   You faint. MAKE SURE YOU:  Understand these instructions.  Will watch your condition.  Will get help right away if you are not doing  well or get worse.   This information is not intended to replace advice given to you by your health care provider. Make sure you discuss any questions you have with your health care provider.   Document Released: 03/18/2001 Document Revised: 01/28/2014 Document Reviewed: 08/31/2012 Elsevier Interactive Patient Education [employer].

## 2015-03-13 NOTE — Patient Instructions (Signed)
Angioedema Angioedema is sudden puffiness (swelling), often of the skin. It can happen:  On your face or privates (genitals).  In your belly (abdomen) or other body parts. It usually happens quickly and gets better in 1 or 2 days. It often starts at night and is found when you wake up. You may get red, itchy patches of skin (hives). Attacks can be dangerous if your breathing passages get puffy. The condition may happen only once, or it can come back at random times. It may happen for several years before it goes away for good. HOME CARE  Only take medicines as told by your doctor.  Always carry your emergency allergy medicines with you.  Wear a medical bracelet as told by your doctor.  Avoid things that you know will cause attacks (triggers). GET HELP IF:  You have another attack.  Your attacks happen more often or get worse.  The condition was passed to you by your parents and you want to have children. GET HELP RIGHT AWAY IF:   Your mouth, tongue, or lips are very puffy.  You have trouble breathing.  You have trouble swallowing.  You pass out (faint). MAKE SURE YOU:   Understand these instructions.  Will watch your condition.  Will get help right away if you are not doing well or get worse.   This information is not intended to replace advice given to you by your health care provider. Make sure you discuss any questions you have with your health care provider.   Document Released: 12/26/2008 Document Revised: 10/28/2012 Document Reviewed: 08/31/2012 Elsevier Interactive Patient Education 2016 Elsevier Inc[employer]   Angioedema Angioedema is a sudden swelling of tissues, often of the skin. It can occur on the face or genitals or in the abdomen or other body parts. The swelling usually develops over a short period and gets better in 24 to 48 hours. It often begins during the night and is found when the person wakes up. The person may also get red, itchy patches of skin  (hives). Angioedema can be dangerous if it involves swelling of the air passages.  Depending on the cause, episodes of angioedema may only happen once, come back in unpredictable patterns, or repeat for several years and then gradually fade away.  CAUSES  Angioedema can be caused by an allergic reaction to various triggers. It can also result from nonallergic causes, including reactions to drugs, immune system disorders, viral infections, or an abnormal gene that is passed to you from your parents (hereditary). For some people with angioedema, the cause is unknown.  Some things that can trigger angioedema include:   Foods.   Medicines, such as ACE inhibitors, ARBs, nonsteroidal anti-inflammatory agents, or estrogen.   Latex.   Animal saliva.   Insect stings.   Dyes used in X-rays.   Mild injury.   Dental work.  Surgery.  Stress.   Sudden changes in temperature.   Exercise. SIGNS AND SYMPTOMS   Swelling of the skin.  Hives. If these are present, there is also intense itching.  Redness in the affected area.   Pain in the affected area.  Swollen lips or tongue.  Breathing problems. This may happen if the air passages swell.  Wheezing. If internal organs are involved, there may be:   Nausea.   Abdominal pain.   Vomiting.   Difficulty swallowing.   Difficulty passing urine. DIAGNOSIS   Your health care provider will examine the affected area and take a medical and family history.  Various tests may be done to help determine the cause. Tests may include:  Allergy skin tests to see if the problem is an allergic reaction.   Blood tests to check for hereditary angioedema.   Tests to check for underlying diseases that could cause the condition.   A review of your medicines, including over-the-counter medicines, may be done. TREATMENT  Treatment will depend on the cause of the angioedema. Possible treatments include:   Removal of anything  that triggered the condition (such as stopping certain medicines).   Medicines to treat symptoms or prevent attacks. Medicines given may include:   Antihistamines.   Epinephrine injection.   Steroids.   Hospitalization may be required for severe attacks. If the air passages are affected, it can be an emergency. Tubes may need to be placed to keep the airway open. HOME CARE INSTRUCTIONS   Take all medicines as directed by your health care provider.  If you were given medicines for emergency allergy treatment, always carry them with you.  Wear a medical bracelet as directed by your health care provider.   Avoid known triggers. SEEK MEDICAL CARE IF:   You have repeat attacks of angioedema.   Your attacks are more frequent or more severe despite preventive measures.   You have hereditary angioedema and are considering having children. It is important to discuss with your health care provider the risks of passing the condition on to your children. SEEK IMMEDIATE MEDICAL CARE IF:   You have severe swelling of the mouth, tongue, or lips.  You have difficulty breathing.   You have difficulty swallowing.   You faint. MAKE SURE YOU:  Understand these instructions.  Will watch your condition.  Will get help right away if you are not doing well or get worse.   This information is not intended to replace advice given to you by your health care provider. Make sure you discuss any questions you have with your health care provider.   Document Released: 03/18/2001 Document Revised: 01/28/2014 Document Reviewed: 08/31/2012 Elsevier Interactive Patient Education [employer].

## 2015-04-04 ENCOUNTER — Other Ambulatory Visit: Payer: Self-pay | Admitting: Family Medicine

## 2015-04-20 ENCOUNTER — Ambulatory Visit (INDEPENDENT_AMBULATORY_CARE_PROVIDER_SITE_OTHER): Payer: Medicare Other | Admitting: Family Medicine

## 2015-04-20 VITALS — BP 148/87 | HR 87 | Temp 98.3°F | Resp 16 | Ht 65.5 in | Wt 143.0 lb

## 2015-04-20 DIAGNOSIS — G8929 Other chronic pain: Secondary | ICD-10-CM

## 2015-04-20 DIAGNOSIS — F17200 Nicotine dependence, unspecified, uncomplicated: Secondary | ICD-10-CM

## 2015-04-20 DIAGNOSIS — E1159 Type 2 diabetes mellitus with other circulatory complications: Secondary | ICD-10-CM

## 2015-04-20 DIAGNOSIS — I10 Essential (primary) hypertension: Secondary | ICD-10-CM | POA: Diagnosis not present

## 2015-04-20 DIAGNOSIS — M545 Low back pain: Secondary | ICD-10-CM | POA: Diagnosis not present

## 2015-04-20 DIAGNOSIS — I2510 Atherosclerotic heart disease of native coronary artery without angina pectoris: Secondary | ICD-10-CM | POA: Diagnosis not present

## 2015-04-20 DIAGNOSIS — R252 Cramp and spasm: Secondary | ICD-10-CM

## 2015-04-20 DIAGNOSIS — E785 Hyperlipidemia, unspecified: Secondary | ICD-10-CM

## 2015-04-20 DIAGNOSIS — E1141 Type 2 diabetes mellitus with diabetic mononeuropathy: Secondary | ICD-10-CM

## 2015-04-20 DIAGNOSIS — Z5181 Encounter for therapeutic drug level monitoring: Secondary | ICD-10-CM | POA: Diagnosis not present

## 2015-04-20 LAB — COMPREHENSIVE METABOLIC PANEL
ALBUMIN: 4.3 g/dL (ref 3.6–5.1)
ALT: 13 U/L (ref 9–46)
AST: 14 U/L (ref 10–35)
Alkaline Phosphatase: 123 U/L — ABNORMAL HIGH (ref 40–115)
BILIRUBIN TOTAL: 0.4 mg/dL (ref 0.2–1.2)
BUN: 14 mg/dL (ref 7–25)
CALCIUM: 9.9 mg/dL (ref 8.6–10.3)
CHLORIDE: 101 mmol/L (ref 98–110)
CO2: 22 mmol/L (ref 20–31)
CREATININE: 0.98 mg/dL (ref 0.70–1.25)
Glucose, Bld: 126 mg/dL — ABNORMAL HIGH (ref 65–99)
Potassium: 4.8 mmol/L (ref 3.5–5.3)
Sodium: 135 mmol/L (ref 135–146)
TOTAL PROTEIN: 7.7 g/dL (ref 6.1–8.1)

## 2015-04-20 LAB — LIPID PANEL
CHOLESTEROL: 173 mg/dL (ref 125–200)
HDL: 41 mg/dL (ref >=40)
LDL Cholesterol: 113 mg/dL (ref <130)
TRIGLYCERIDES: 96 mg/dL (ref <150)
Total CHOL/HDL Ratio: 4.2 Ratio (ref <=5.0)
VLDL: 19 mg/dL (ref <30)

## 2015-04-20 LAB — MICROALBUMIN / CREATININE URINE RATIO
Creatinine, Urine: 28 mg/dL (ref 20–370)
Microalb Creat Ratio: 114 mcg/mg creat — ABNORMAL HIGH (ref <30)
Microalb, Ur: 3.2 mg/dL

## 2015-04-20 LAB — POCT GLYCOSYLATED HEMOGLOBIN (HGB A1C): HEMOGLOBIN A1C: 7.3

## 2015-04-20 MED ORDER — CYCLOBENZAPRINE HCL 10 MG PO TABS: 10.0000 mg | ORAL_TABLET | Freq: Every day | ORAL | Status: DC

## 2015-04-20 MED ORDER — HYDROCODONE-ACETAMINOPHEN 10-325 MG PO TABS: 1.0000 | ORAL_TABLET | Freq: Four times a day (QID) | ORAL | Status: DC | PRN

## 2015-04-20 MED ORDER — CETIRIZINE HCL 10 MG PO TABS: 10.0000 mg | ORAL_TABLET | Freq: Every day | ORAL | Status: DC

## 2015-04-20 MED ORDER — METFORMIN HCL 500 MG PO TABS: 500.0000 mg | ORAL_TABLET | Freq: Two times a day (BID) | ORAL | Status: DC

## 2015-04-20 MED ORDER — AMLODIPINE BESYLATE 5 MG PO TABS: 5.0000 mg | ORAL_TABLET | Freq: Every day | ORAL | Status: DC

## 2015-04-20 MED ORDER — GABAPENTIN 300 MG PO CAPS: 900.0000 mg | ORAL_CAPSULE | Freq: Two times a day (BID) | ORAL | Status: DC

## 2015-04-20 MED ORDER — MONTELUKAST SODIUM 10 MG PO TABS: 10.0000 mg | ORAL_TABLET | Freq: Every day | ORAL | Status: DC

## 2015-04-20 NOTE — Progress Notes (Signed)
Subjective:    Patient ID: Alexander [name], male    DOB: 1945/01/22, 70 y.o.   MRN: [id number] Chief Complaint  Patient presents with  . Diabetes  . Hypertension    HPI a1c 7.1 -> 6.6 -> 6.9 -> 7.0 Microalb done 04/2014, lipid done 04/2014. Not eating today but has taken meds.  Spray works well for sinuses.  GI ok Saw optho within the last yr - Dr. Katy Fitch  HTN: had acei angiodema last mo so had to stop his lisinopril   He has started driking organic juice in the morning - carrot, apple,, pineapple - a woman on his street fixes it.  Also drinks 2 cups of coffee every morning with 3 spoons of reg sugar.  No more swelling in face but has been having some cramping in his feet. More at night when laying down. Flexeril has worked well for this prior.  Working some Taking asa 81 qd. Not checking bp at home  No GU, no CP,  No feet numbness Takes gabapentin 900 bid for left sciatic from back pain.  If he misses it after a day ti will get worse.  Depression screen [facility] 2/9 04/20/2015 03/13/2015 02/03/2015 01/12/2015 10/24/2014  Decreased Interest 0 0 0 0 0  Down, Depressed, Hopeless 0 0 0 0 0  PHQ - 2 Score 0 0 0 0 0   Past Medical History  Diagnosis Date  . Cancer [facility])     prostate  . Hypertension   . Neuromuscular disorder [facility])    Past Surgical History  Procedure Laterality Date  . Prostate surgery  2005  . Hernia repair    . Colon surgery      "locked bowel" fixed  . Left heart catheterization with coronary angiogram N/A 09/15/2013    Procedure: LEFT HEART CATHETERIZATION WITH CORONARY ANGIOGRAM;  Surgeon: Peter M Martinique, MD;  Location: [facility] CATH LAB;  Service: Cardiovascular;  Laterality: N/A;   Current Outpatient Prescriptions on File Prior to Visit  Medication Sig Dispense Refill  . aspirin EC 81 MG tablet Take 81 mg by mouth daily.    . Blood Glucose Monitoring Suppl W/DEVICE KIT 1 each by Does not apply route daily. 100 each 3  . fluticasone (FLONASE) 50 MCG/ACT nasal  spray Place 2 sprays into both nostrils daily as needed for allergies. 16 g 11  . hydrOXYzine (ATARAX/VISTARIL) 25 MG tablet Take 1 tablet (25 mg total) by mouth 3 (three) times daily. 30 tablet 0  . [DISCONTINUED] lisinopril (PRINIVIL,ZESTRIL) 10 MG tablet TAKE ONE TABLET BY MOUTH ONCE DAILY 30 tablet 9   No current facility-administered medications on file prior to visit.   No Known Allergies Family History  Problem Relation Age of Onset  . Diabetes Mother   . Depression Father   . Hyperlipidemia Sister    Social History   Social History  . Marital Status: Widowed    Spouse Name: N/A  . Number of Children: 2  . Years of Education: 9   Occupational History  . retired    Social History Main Topics  . Smoking status: Current Every Day Smoker -- 0.00 packs/day for 51 years    Types: Cigarettes  . Smokeless tobacco: Never Used  . Alcohol Use: No  . Drug Use: No  . Sexual Activity: Yes   Other Topics Concern  . Not on file   Social History Narrative   Patient is single and his daughter lives with him.   Patient has two  children.   Patient has a 9th grade education.   Patient works in Architect (part-time).   Patient drinks one to two cups of coffee daily.   Patient is right-handed.        Is a brick mason   Review of Systems  Constitutional: Negative for fever and chills.  HENT: Negative for facial swelling, rhinorrhea and sinus pressure.   Eyes: Negative for visual disturbance.  Respiratory: Negative for chest tightness and shortness of breath.   Cardiovascular: Negative for chest pain, palpitations and leg swelling.  Gastrointestinal: Negative for nausea, vomiting, abdominal pain, diarrhea and constipation.  Genitourinary: Negative for dysuria, frequency, hematuria, decreased urine volume and difficulty urinating.  Musculoskeletal: Positive for myalgias, back pain and arthralgias. Negative for joint swelling and gait problem.  Neurological: Negative for  dizziness, syncope, facial asymmetry, weakness, light-headedness and headaches.  Psychiatric/Behavioral: Positive for sleep disturbance. Negative for dysphoric mood. The patient is not nervous/anxious.        Objective:  BP 148/87 mmHg  Pulse 87  Temp(Src) 98.3 F (36.8 C)  Resp 16  Ht 5' 5.5" (1.664 m)  Wt 143 lb (64.864 kg)  BMI 23.43 kg/m2  Physical Exam  Constitutional: He is oriented to person, place, and time. He appears well-developed and well-nourished. No distress.  HENT:  Head: Normocephalic and atraumatic.  Eyes: Conjunctivae are normal. Pupils are equal, round, and reactive to light. No scleral icterus.  Neck: Normal range of motion. Neck supple. No thyromegaly present.  Cardiovascular: Normal rate, regular rhythm, normal heart sounds and intact distal pulses.   Pulmonary/Chest: Effort normal and breath sounds normal. No respiratory distress.  Musculoskeletal: He exhibits no edema.  Lymphadenopathy:    He has no cervical adenopathy.  Neurological: He is alert and oriented to person, place, and time.  Skin: Skin is warm and dry. He is not diaphoretic.  Psychiatric: He has a normal mood and affect. His behavior is normal.      Results for orders placed or performed in visit on 04/20/15  POCT glycosylated hemoglobin (Hb A1C)  Result Value Ref Range   Hemoglobin A1C 7.3        Assessment & Plan:   1. Type 2 diabetes mellitus with other circulatory complications (HCC) - X7W 7.1 -> 6.6 -> 6.9 -> 7.0 -> 7.3  2. Tobacco use disorder   3. HTN (hypertension), benign   4. Coronary artery disease involving native coronary artery of native heart without angina pectoris   5. Medication monitoring encounter   6. 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 Cramp of both lower extremities   8. Chronic lower back pain     Orders Placed This Encounter  Procedures  . Lipid panel    Order Specific  Question:  Has the patient fasted?    Answer:  Yes  . Comprehensive metabolic panel    Order Specific Question:  Has the patient fasted?    Answer:  Yes  . Microalbumin/Creatinine Ratio, Urine  . POCT glycosylated hemoglobin (Hb A1C)    Meds ordered this encounter  Medications  . montelukast (SINGULAIR) 10 MG tablet    Sig: Take 1 tablet (10 mg total) by mouth at bedtime.    Dispense:  90 tablet    Refill:  1  . metFORMIN (GLUCOPHAGE) 500 MG tablet    Sig: Take 1 tablet (500 mg total) by mouth 2 (two) times daily with a meal.  Dispense:  180 tablet    Refill:  1  . cyclobenzaprine (FLEXERIL) 10 MG tablet    Sig: Take 1 tablet (10 mg total) by mouth at bedtime.    Dispense:  90 tablet    Refill:  1  . cetirizine (ZYRTEC) 10 MG tablet    Sig: Take 1 tablet (10 mg total) by mouth at bedtime.    Dispense:  90 tablet    Refill:  1  . gabapentin (NEURONTIN) 300 MG capsule    Sig: Take 3 capsules (900 mg total) by mouth 2 (two) times daily.    Dispense:  540 capsule    Refill:  1  . HYDROcodone-acetaminophen (NORCO) 10-325 MG tablet    Sig: Take 1 tablet by mouth every 6 (six) hours as needed for moderate pain.    Dispense:  60 tablet    Refill:  0  . amLODipine (NORVASC) 5 MG tablet    Sig: Take 1 tablet (5 mg total) by mouth daily.    Dispense:  90 tablet    Refill:  1    [name], MD MPH

## 2015-04-20 NOTE — Patient Instructions (Addendum)
For feet cramping - try plenty of water, increase diet in bananas or sweet potatoes, and try a tums (for magnesium). I have e-prescribed your medications to [facility].   IF you received an x-ray today, you will receive an invoice from [facility] Radiology. Please contact [facility] Radiology at [phone] with questions or concerns regarding your invoice.   IF you received labwork today, you will receive an invoice from [employer]. Please contact [facility] at [phone] with questions or concerns regarding your invoice.   Our billing staff will not be able to assist you with questions regarding bills from these companies.  You will be contacted with the lab results as soon as they are available. The fastest way to get your results is to activate your My Chart account. Instructions are located on the last page of this paperwork. If you have not heard from [place] regarding the results in 2 weeks, please contact this office.     Home Delivery Savings You could save money, time and a trip to the pharmacy. You could pay $0 for the same medication when you use home delivery through [facility], your plan's preferred mail service pharmacy. Pay $0 for a 90-day supply of Tier 1 and Tier 2 medications (includes nearly all generic drugs and most commonly used brand name drugs) for some plans.  Savings and convenience-delivered to you. Pharmacists are available by phone to answer your questions any day, any time.  Completed orders for a new prescription should arrive within ten business days, completed refill orders should arrive in about seven business days.  No charge for standard delivery to U[place] addresses, including U.S. territories.  Getting started [facility] makes it easy to move to home delivery. Get started with only a few quick steps. We can even contact your doctor to move your prescription. Begin using home delivery today. Online Set up your online account at [email or web address]  or use the [facility] App on your smart phone or tablet. Phone Call [facility] toll-free at [phone]), any day, anytime. [facility] can get you set up with home delivery right over the phone.

## 2015-05-01 DIAGNOSIS — E785 Hyperlipidemia, unspecified: Secondary | ICD-10-CM | POA: Insufficient documentation

## 2015-05-01 MED ORDER — PRAVASTATIN SODIUM 40 MG PO TABS: 40.0000 mg | ORAL_TABLET | Freq: Every day | ORAL | Status: DC

## 2015-05-01 NOTE — Addendum Note (Signed)
Addended by: [name] on: 05/01/2015 02:12 PM   Modules accepted: Orders, SmartSet

## 2015-05-09 ENCOUNTER — Telehealth: Payer: Self-pay

## 2015-05-09 NOTE — Telephone Encounter (Signed)
Spoke with pt, he states he is unable to get the flexeril and Pamala Hurry states it may be because it is a high risk medication. Did you want to change the medication or go through with the prior authorization?

## 2015-05-09 NOTE — Telephone Encounter (Signed)
Pt states he sees Dr Brigitte Pulse and given all his medicine but his insurance wouldn't cover the medicine for Cramps or his Inhaler medicine. Please call [place]

## 2015-05-09 NOTE — Telephone Encounter (Signed)
Spoke with pt, he didn't get the flonase. I advised him that he would have to purchase this OTC.

## 2015-05-20 ENCOUNTER — Other Ambulatory Visit: Payer: Self-pay | Admitting: Family Medicine

## 2015-05-23 NOTE — Telephone Encounter (Signed)
Dr Brigitte Pulse, did you want to leave pt on Zantac? He was started on it on 03/13/15 but not given any RFs.

## 2015-05-23 NOTE — Telephone Encounter (Signed)
Pt had used cyclobenzaprine in the past which is why we tried it.  We can change it to tizanidine 4mg  - same sig, same quant  - which is what I think is now preferred by most medicare - usually the cheapest and about the same effectiveness and side effects as cyclobenzaprine, or he can just buy the cyclobenzaprine for $4 out of pocket and not use in his insurance - it is on the $4 list at most places including [facility] where the rx already is (this is what I would recommend since he knows that he already does well on it.)  Same with the nasal steroid.  He can use whatever is the cheapest - he should check with the pharmacist at [facility] who should be able to tell him, or buy whatever is on sale, but I think normally rhinocort (budesonide) is going to be cheapest - usually can get for ~ $10 with a goodrx coupon.

## 2015-05-24 NOTE — Telephone Encounter (Signed)
Spoke with pt, advised message. He would like to speak with Dr. Brigitte Pulse only.

## 2015-05-25 NOTE — Telephone Encounter (Signed)
Called pt on home # - LVM

## 2015-06-26 DIAGNOSIS — M5136 Other intervertebral disc degeneration, lumbar region: Secondary | ICD-10-CM | POA: Diagnosis not present

## 2015-06-26 DIAGNOSIS — M1712 Unilateral primary osteoarthritis, left knee: Secondary | ICD-10-CM | POA: Diagnosis not present

## 2015-08-12 ENCOUNTER — Other Ambulatory Visit: Payer: Self-pay | Admitting: Family Medicine

## 2015-08-16 NOTE — Progress Notes (Signed)
Subjective:    Patient ID: Alexander [name], male    DOB: Oct 09, 1945, 70 y.o.   MRN: [id number] Chief Complaint  Patient presents with  . Follow-up    4 MONTH/MEDICATION/DIABETES    HPI  Alexander [name] is a 70 yo male here today for a 4 month follow-up on his chronic medical conditions.  DMII: hgba1c 6.6 -> 6.9 -> 7.0-> 7.3.  Microalb elev 04/20/15 (pt had acei angioedema).  Sees optho Dr. Katy Fitch annually - last 11/2014.  Taking asa 81 qd.  On metformin 500 bid. No pneumonia vaccines in pt's chart. Foot exam done 10/24/2014. No GI/GU.  No checking cbgs at home  HPL goal <70:  Started on pravastatin 40 3 mos prior as LDL 97->104->113 but it caused his feet to cramp so he stopped taking it. CAD: No prior MI but pt did have abnml stress  HTN: On amlodipine 63m qd. (had acei angioedema). Last BP was above goal.  Not checking BP outside office.  No lower ext edema.  Lumbar DDD with radiculopathy: On gabapentin 900 bid with rare hydrocodone for years.  We tried flexeril at last visit but due to complications with insurance he was never able to get it and doesn't feel he needs it anymore.  Past Medical History:  Diagnosis Date  . [facility] ([facility]    prostate  . Hypertension   . Neuromuscular disorder ([facility]    Past Surgical History:  Procedure Laterality Date  . COLON SURGERY     "locked bowel" fixed  . HERNIA REPAIR    . LEFT HEART CATHETERIZATION WITH CORONARY ANGIOGRAM N/A 09/15/2013   Procedure: LEFT HEART CATHETERIZATION WITH CORONARY ANGIOGRAM;  Surgeon: Peter M JMartinique MD;  [facility];  Service: Cardiovascular;  Laterality: N/A;  . PROSTATE SURGERY  2005   Current Outpatient Prescriptions on File Prior to Visit  Medication Sig Dispense Refill  . aspirin EC 81 MG tablet Take 81 mg by mouth daily.    . Blood Glucose Monitoring Suppl W/DEVICE KIT 1 each by Does not apply route daily. 100 each 3  . cetirizine (ZYRTEC) 10 MG tablet Take 1 tablet (10 mg total) by mouth at bedtime. 90  tablet 1  . hydrOXYzine (ATARAX/VISTARIL) 25 MG tablet Take 1 tablet (25 mg total) by mouth 3 (three) times daily. 30 tablet 0  . montelukast (SINGULAIR) 10 MG tablet Take 1 tablet by mouth at  bedtime 90 tablet 0  . ranitidine (ZANTAC) 150 MG tablet Take 1 tablet (150 mg total) by mouth 2 (two) times daily as needed for heartburn. 180 tablet 3  . [DISCONTINUED] lisinopril (PRINIVIL,ZESTRIL) 10 MG tablet TAKE ONE TABLET BY MOUTH ONCE DAILY 30 tablet 9   No current facility-administered medications on file prior to visit.    Allergies  Allergen Reactions  . Lisinopril Swelling    angioedema   Family History  Problem Relation Age of Onset  . Diabetes Mother   . Depression Father   . Hyperlipidemia Sister    Social History   Social History  . Marital status: Widowed    Spouse name: N/A  . Number of children: 2  . Years of education: 9   Occupational History  . retired    Social History Main Topics  . Smoking status: Current Every Day Smoker    Packs/day: 0.00    Years: 51.00    Types: Cigarettes  . Smokeless tobacco: Never Used  . Alcohol use No  . Drug use: No  .  Sexual activity: Yes   Other Topics Concern  . None   Social History Narrative   Patient is single and his daughter lives with him.   Patient has two children.   Patient has a 9th grade education.   Patient works in Architect (part-time).   Patient drinks one to two cups of coffee daily.   Patient is right-handed.           Review of Systems  Constitutional: Negative for chills and fever.  Eyes: Negative for visual disturbance.  Respiratory: Negative for chest tightness and shortness of breath.   Cardiovascular: Negative for chest pain and leg swelling.  Musculoskeletal: Positive for arthralgias, back pain and myalgias.  Neurological: Negative for dizziness, syncope, facial asymmetry, weakness, light-headedness and headaches.       Objective:   Physical Exam  Constitutional: He is oriented  to person, place, and time. He appears well-developed and well-nourished. No distress.  HENT:  Head: Normocephalic and atraumatic.  Eyes: Conjunctivae are normal. Pupils are equal, round, and reactive to light. No scleral icterus.  Neck: Normal range of motion. Neck supple. No thyromegaly present.  Cardiovascular: Normal rate, regular rhythm, normal heart sounds and intact distal pulses.   Pulmonary/Chest: Effort normal and breath sounds normal. No respiratory distress.  Musculoskeletal: He exhibits no edema.  Lymphadenopathy:    He has no cervical adenopathy.  Neurological: He is alert and oriented to person, place, and time.  Skin: Skin is warm and dry. He is not diaphoretic.  Psychiatric: He has a normal mood and affect. His behavior is normal.      BP 138/84   Pulse 90   Temp 98.6 F (37 C) (Oral)   Resp 18   Ht 5' 5.5" (1.664 m)   Wt 139 lb (63 kg)   SpO2 96%   BMI 22.78 kg/m      Assessment & Plan:   prevnar today, pneumovax next yr but pt declined both Check flp at f/u visit in 4 mos. Get shingles vaccine   Recheck in 4 mos with flp.  1. Type 2 diabetes mellitus with other circulatory complications (HCC) - [id number] 6.6 -> 6.9 -> 7.0-> 7.3 -> 7.0 today. Increase metformin 500 bid to 850g bid.  2. HTN (hypertension), benign - cont amlodipine 5 - Cons increasing at f/u  3. Hyperlipidemia LDL goal <70 - could not tolerated pravastatin 40 due to myalgias but encouraged pt to retry with coenzyme q10.  4. Left lumbar radiculopathy   5. Medication monitoring encounter   6. Chronic lower back pain - Ok to refill gabapentin prn - will increase from bid to tid so he has some extra as reports his mail order pharmacy just sent him a 2 mo supply rather than a 3 mo    Orders Placed This Encounter  Procedures  . Comprehensive metabolic panel  . POCT glycosylated hemoglobin (Hb A1C)    Meds ordered this encounter  Medications  . Zoster Vaccine Live, PF, (ZOSTAVAX) 06269  UNT/0.65ML injection    Sig: Inject 19,400 Units into the skin once.    Dispense:  1 vial    Refill:  0  . HYDROcodone-acetaminophen (NORCO) 10-325 MG tablet    Sig: Take 1 tablet by mouth every 6 (six) hours as needed for moderate pain.    Dispense:  60 tablet    Refill:  0  . gabapentin (NEURONTIN) 300 MG capsule    Sig: Take 3 capsules (900 mg total) by mouth 3 (three) times  daily.    Dispense:  810 capsule    Refill:  1  . amLODipine (NORVASC) 5 MG tablet    Sig: Take 1 tablet (5 mg total) by mouth daily.    Dispense:  90 tablet    Refill:  1     [name], M.D.  [facility] [address] [place], [place] [address] [phone] phone [phone] fax  08/20/15 5:35 PM  Results for orders placed or performed in visit on 08/17/15  Comprehensive metabolic panel  Result Value Ref Range   Sodium 138 135 - 146 mmol/L   Potassium 4.2 3.5 - 5.3 mmol/L   Chloride 107 98 - 110 mmol/L   CO2 20 20 - 31 mmol/L   Glucose, Bld 219 (H) 65 - 99 mg/dL   BUN 16 7 - 25 mg/dL   Creat 1.15 0.70 - 1.25 mg/dL   Total Bilirubin 0.4 0.2 - 1.2 mg/dL   Alkaline Phosphatase 109 40 - 115 U/L   AST 14 10 - 35 U/L   ALT 10 9 - 46 U/L   Total Protein 7.5 6.1 - 8.1 g/dL   Albumin 4.1 3.6 - 5.1 g/dL   Calcium 9.2 8.6 - 10.3 mg/dL  POCT glycosylated hemoglobin (Hb A1C)  Result Value Ref Range   Hemoglobin A1C 7.0

## 2015-08-17 ENCOUNTER — Ambulatory Visit (INDEPENDENT_AMBULATORY_CARE_PROVIDER_SITE_OTHER): Payer: Medicare Other | Admitting: Family Medicine

## 2015-08-17 ENCOUNTER — Encounter: Payer: Self-pay | Admitting: Family Medicine

## 2015-08-17 VITALS — BP 138/84 | HR 90 | Temp 98.6°F | Resp 18 | Ht 65.5 in | Wt 139.0 lb

## 2015-08-17 DIAGNOSIS — G8929 Other chronic pain: Secondary | ICD-10-CM

## 2015-08-17 DIAGNOSIS — M5416 Radiculopathy, lumbar region: Secondary | ICD-10-CM

## 2015-08-17 DIAGNOSIS — E785 Hyperlipidemia, unspecified: Secondary | ICD-10-CM | POA: Diagnosis not present

## 2015-08-17 DIAGNOSIS — M545 Low back pain, unspecified: Secondary | ICD-10-CM

## 2015-08-17 DIAGNOSIS — E1159 Type 2 diabetes mellitus with other circulatory complications: Secondary | ICD-10-CM

## 2015-08-17 DIAGNOSIS — Z5181 Encounter for therapeutic drug level monitoring: Secondary | ICD-10-CM

## 2015-08-17 DIAGNOSIS — I10 Essential (primary) hypertension: Secondary | ICD-10-CM

## 2015-08-17 LAB — COMPREHENSIVE METABOLIC PANEL
ALK PHOS: 109 U/L (ref 40–115)
ALT: 10 U/L (ref 9–46)
AST: 14 U/L (ref 10–35)
Albumin: 4.1 g/dL (ref 3.6–5.1)
BUN: 16 mg/dL (ref 7–25)
CO2: 20 mmol/L (ref 20–31)
Calcium: 9.2 mg/dL (ref 8.6–10.3)
Chloride: 107 mmol/L (ref 98–110)
Creat: 1.15 mg/dL (ref 0.70–1.25)
Glucose, Bld: 219 mg/dL — ABNORMAL HIGH (ref 65–99)
Potassium: 4.2 mmol/L (ref 3.5–5.3)
SODIUM: 138 mmol/L (ref 135–146)
Total Bilirubin: 0.4 mg/dL (ref 0.2–1.2)
Total Protein: 7.5 g/dL (ref 6.1–8.1)

## 2015-08-17 LAB — POCT GLYCOSYLATED HEMOGLOBIN (HGB A1C): HEMOGLOBIN A1C: 7

## 2015-08-17 MED ORDER — GABAPENTIN 300 MG PO CAPS: 900.0000 mg | ORAL_CAPSULE | Freq: Three times a day (TID) | ORAL | 1 refills | Status: DC

## 2015-08-17 MED ORDER — HYDROCODONE-ACETAMINOPHEN 10-325 MG PO TABS: 1.0000 | ORAL_TABLET | Freq: Four times a day (QID) | ORAL | 0 refills | Status: DC | PRN

## 2015-08-17 MED ORDER — ZOSTER VACCINE LIVE 19400 UNT/0.65ML ~~LOC~~ SUSR: 0.6500 mL | Freq: Once | SUBCUTANEOUS | 0 refills | Status: AC

## 2015-08-17 MED ORDER — AMLODIPINE BESYLATE 5 MG PO TABS: 5.0000 mg | ORAL_TABLET | Freq: Every day | ORAL | 1 refills | Status: DC

## 2015-08-17 NOTE — Patient Instructions (Addendum)
IF you received an x-ray today, you will receive an invoice from [facility] Radiology. Please contact [facility] Radiology at [phone] with questions or concerns regarding your invoice.   IF you received labwork today, you will receive an invoice from [employer]. Please contact [facility] at [phone] with questions or concerns regarding your invoice.   Our billing staff will not be able to assist you with questions regarding bills from these companies.  You will be contacted with the lab results as soon as they are available. The fastest way to get your results is to activate your My Chart account. Instructions are located on the last page of this paperwork. If you have not heard from [place] regarding the results in 2 weeks, please contact this office.    Try coenzyme q10 if you get cramping with the new cholesterol medication.  Diet for Metabolic Syndrome Metabolic syndrome is a disorder that includes at least three of these conditions:  Abdominal obesity.  Too much sugar in your blood.  High blood pressure.  Higher than normal amount of fat (lipids) in your blood.  Lower than normal level of "good" cholesterol (HDL). Following a healthy diet can help to keep metabolic syndrome under control. It can also help to prevent the development of conditions that are associated with metabolic syndrome, such as diabetes, heart disease, and stroke. Along with exercise, a healthy diet:  Helps to improve the way that the body uses insulin.  Promotes weight loss. A common goal for people with this condition is to lose at least 7 to 10 percent of their starting weight. WHAT DO I NEED TO KNOW ABOUT THIS DIET?  Use the glycemic index (GI) to plan your meals. The index tells you how quickly a food will raise your blood sugar. Choose foods that have low GI values. These foods take a longer time to raise blood sugar.  Keep track of how many calories you take in.  Eating the right amount of calories will help your achieve a healthy weight.  You may want to follow a Mediterranean diet. This diet includes lots of vegetables, lean meats or fish, whole grains, fruits, and healthy oils and fats. WHAT FOODS CAN I EAT? Grains Stone-ground whole wheat. Pumpernickel bread. Whole-grain bread, crackers, tortillas, cereal, and pasta. Unsweetened oatmeal.Bulgur.Barley.Quinoa.Brown rice or wild rice. Vegetables Lettuce. Spinach. Peas. Beets. Cauliflower. Cabbage. Broccoli. Carrots. Tomatoes. Squash. Eggplant. Herbs. Peppers. Onions. Cucumbers. Brussels sprouts. Sweet potatoes. Yams. Beans. Lentils. Fruits Berries. Apples. Oranges. Grapes. Mango. Pomegranate. Kiwi. Cherries. Meats and Other Protein Sources Seafood and shellfish. Lean meats.Poultry. Tofu. Dairy Low-fat or fat-free dairy products, such as milk, yogurt, and cheese. Beverages Water. Low-fat milk. Milk alternatives, like soy milk or almond milk. Real fruit juice. Condiments Low-sugar or sugar-free ketchup, barbecue sauce, and mayonnaise. Mustard. Relish. Fats and Oils Avocado. Canola or olive oil. Nuts and nut butters.Seeds. The items listed above may not be a complete list of recommended foods or beverages. Contact your dietitian for more options.  WHAT FOODS ARE NOT RECOMMENDED? Red meat. Palm oil and coconut oil. Processed foods. Fried foods. Alcohol. Sweetened drinks, such as iced tea and soda. Sweets. Salty foods. The items listed above may not be a complete list of foods and beverages to avoid. Contact your dietitian for more information.   This information is not intended to replace advice given to you by your health care provider. Make sure you discuss any questions you have with your health care provider.  Document Released: 05/24/2014 Document Reviewed: 05/24/2014 Elsevier Interactive Patient Education [employer].

## 2015-08-20 MED ORDER — METFORMIN HCL 850 MG PO TABS: 850.0000 mg | ORAL_TABLET | Freq: Two times a day (BID) | ORAL | 1 refills | Status: DC

## 2015-09-22 DIAGNOSIS — M5137 Other intervertebral disc degeneration, lumbosacral region: Secondary | ICD-10-CM | POA: Diagnosis not present

## 2015-09-25 DIAGNOSIS — M25562 Pain in left knee: Secondary | ICD-10-CM | POA: Diagnosis not present

## 2015-09-27 DIAGNOSIS — G5602 Carpal tunnel syndrome, left upper limb: Secondary | ICD-10-CM | POA: Diagnosis not present

## 2015-09-28 DIAGNOSIS — M25572 Pain in left ankle and joints of left foot: Secondary | ICD-10-CM | POA: Diagnosis not present

## 2015-10-04 DIAGNOSIS — M545 Low back pain: Secondary | ICD-10-CM | POA: Diagnosis not present

## 2015-11-03 ENCOUNTER — Telehealth: Payer: Self-pay

## 2015-11-03 NOTE — Telephone Encounter (Addendum)
[facility] called w/pt on the line who did request a Rx for the lidocaine ointment from Dr Brigitte Pulse for use on his back pain. Pt was seen for DDD lumbar in July. They are faxing a new order form today to my attn and "OKd by pt".  Received order form, completed what I could and will put in Dr Raul Del box for review.

## 2015-11-08 NOTE — Telephone Encounter (Signed)
Princess calling to check on status of this message. CB# [phone]. PT'S ID #  [id number]

## 2015-11-08 NOTE — Telephone Encounter (Signed)
Signed and placed in to fax slot in provider workroom

## 2015-11-13 ENCOUNTER — Other Ambulatory Visit: Payer: Self-pay

## 2015-11-13 NOTE — Telephone Encounter (Signed)
Other request for lido ointment. See prior med management note

## 2015-12-01 IMAGING — CR DG CHEST 2V
2 series · 2 of 2 positions shown · non-contrast
Comparison: 06/16/2009

CLINICAL DATA: Hypertension.  Smoker.  Preop.

EXAM:
CHEST  2 VIEW

[w chest pa]
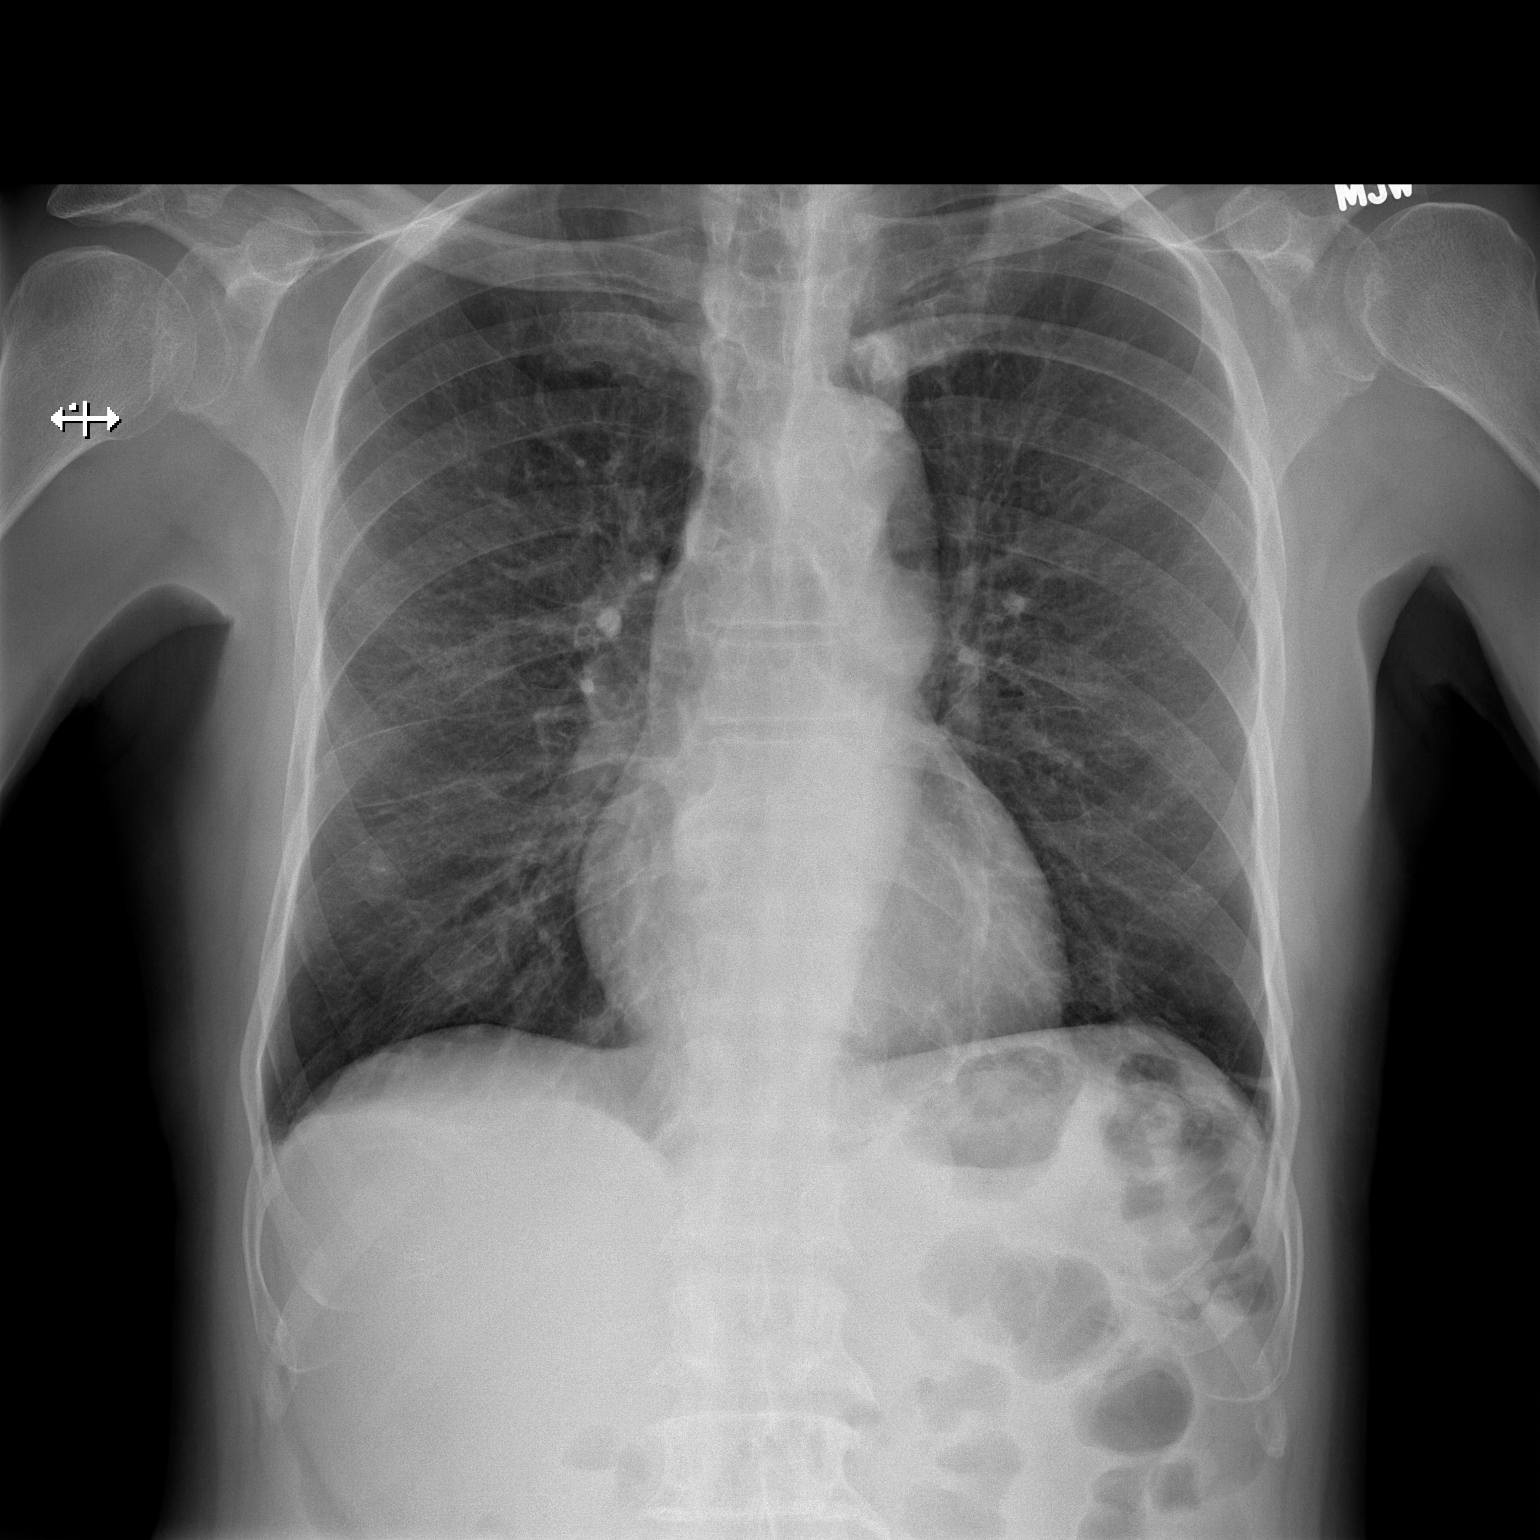

[w chest lat]
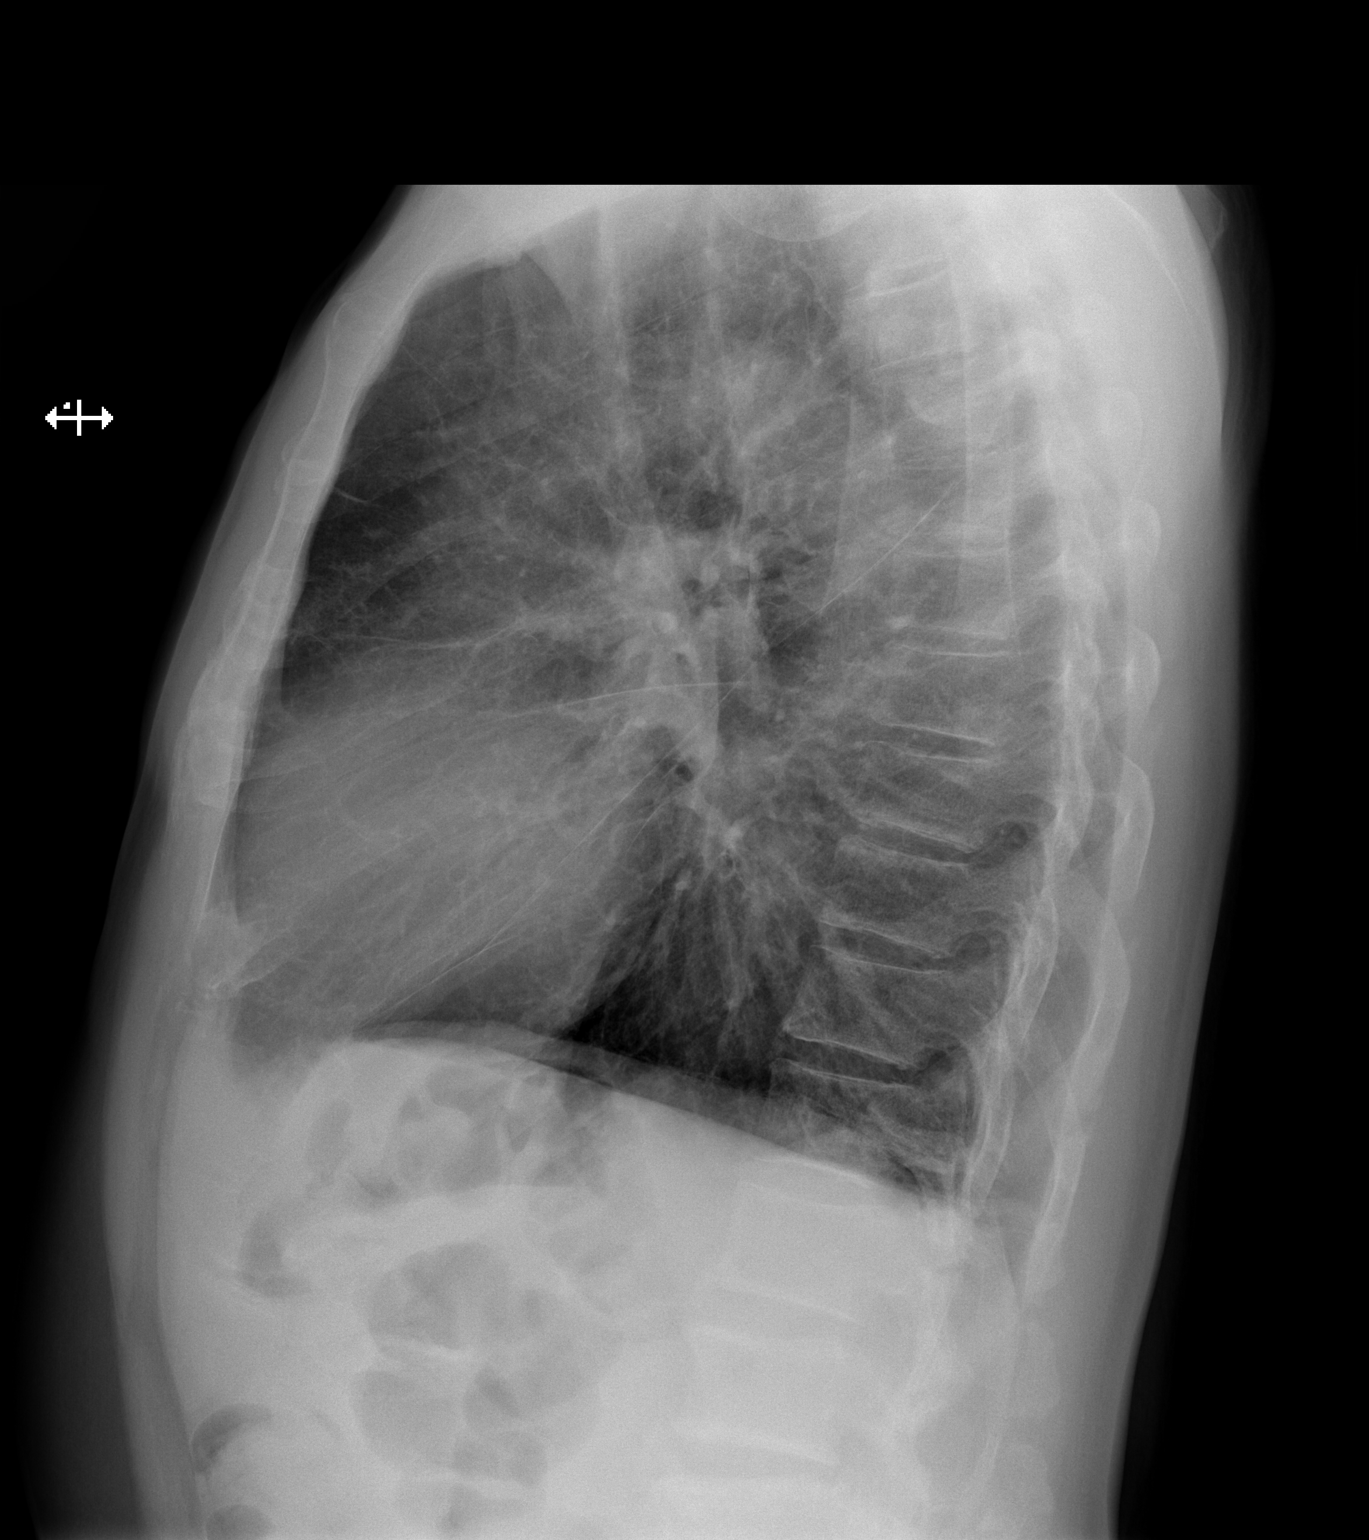

[2 of 2 positions shown; findings below may reference images not displayed]

FINDINGS: Cardiac silhouette is normal in size and configuration. Aorta is
mildly uncoiled. No mediastinal or hilar masses. No evidence of
adenopathy.

Prominent interstitial markings, stable. Minor linear subsegmental
atelectasis or scarring at the left anterior lateral lung base.
Lungs are mildly hyperexpanded but otherwise clear. No pleural
effusion or pneumothorax.

Bony thorax is intact.
IMPRESSION: No acute cardiopulmonary disease.

## 2015-12-25 ENCOUNTER — Other Ambulatory Visit: Payer: Self-pay | Admitting: Family Medicine

## 2015-12-25 NOTE — Telephone Encounter (Signed)
07/2015 last ov and labs 

## 2015-12-28 ENCOUNTER — Other Ambulatory Visit: Payer: Self-pay

## 2015-12-28 MED ORDER — MONTELUKAST SODIUM 10 MG PO TABS: 10.0000 mg | ORAL_TABLET | Freq: Every day | ORAL | 0 refills | Status: DC

## 2015-12-28 NOTE — Telephone Encounter (Signed)
Received fax for refill of Singulair - sent Also asked for Pravachol - pt stated made his feet cramp-discontinued;  Flexeril - pt states he is not taking  Denied Pravachol and Flexeril.

## 2016-01-11 DIAGNOSIS — M249 Joint derangement, unspecified: Secondary | ICD-10-CM | POA: Diagnosis not present

## 2016-01-15 DIAGNOSIS — M25572 Pain in left ankle and joints of left foot: Secondary | ICD-10-CM | POA: Diagnosis not present

## 2016-01-15 DIAGNOSIS — M19041 Primary osteoarthritis, right hand: Secondary | ICD-10-CM | POA: Diagnosis not present

## 2016-01-15 DIAGNOSIS — M25571 Pain in right ankle and joints of right foot: Secondary | ICD-10-CM | POA: Diagnosis not present

## 2016-01-30 DIAGNOSIS — M532X7 Spinal instabilities, lumbosacral region: Secondary | ICD-10-CM | POA: Diagnosis not present

## 2016-01-30 DIAGNOSIS — M545 Low back pain: Secondary | ICD-10-CM | POA: Diagnosis not present

## 2016-01-30 DIAGNOSIS — S63502A Unspecified sprain of left wrist, initial encounter: Secondary | ICD-10-CM | POA: Diagnosis not present

## 2016-01-30 DIAGNOSIS — S63501A Unspecified sprain of right wrist, initial encounter: Secondary | ICD-10-CM | POA: Diagnosis not present

## 2016-02-06 DIAGNOSIS — S43421A Sprain of right rotator cuff capsule, initial encounter: Secondary | ICD-10-CM | POA: Diagnosis not present

## 2016-02-12 ENCOUNTER — Other Ambulatory Visit: Payer: Self-pay | Admitting: Family Medicine

## 2016-03-12 DIAGNOSIS — M532X7 Spinal instabilities, lumbosacral region: Secondary | ICD-10-CM | POA: Diagnosis not present

## 2016-03-12 DIAGNOSIS — S335XXD Sprain of ligaments of lumbar spine, subsequent encounter: Secondary | ICD-10-CM | POA: Diagnosis not present

## 2016-03-13 ENCOUNTER — Ambulatory Visit (INDEPENDENT_AMBULATORY_CARE_PROVIDER_SITE_OTHER): Payer: Medicare Other

## 2016-03-13 ENCOUNTER — Ambulatory Visit (INDEPENDENT_AMBULATORY_CARE_PROVIDER_SITE_OTHER): Payer: Medicare Other | Admitting: Family Medicine

## 2016-03-13 VITALS — BP 164/82 | HR 115 | Temp 98.9°F | Resp 18 | Ht 65.5 in | Wt 141.8 lb

## 2016-03-13 DIAGNOSIS — R062 Wheezing: Secondary | ICD-10-CM | POA: Diagnosis not present

## 2016-03-13 DIAGNOSIS — R05 Cough: Secondary | ICD-10-CM

## 2016-03-13 DIAGNOSIS — M545 Low back pain: Secondary | ICD-10-CM

## 2016-03-13 DIAGNOSIS — R059 Cough, unspecified: Secondary | ICD-10-CM

## 2016-03-13 DIAGNOSIS — J441 Chronic obstructive pulmonary disease with (acute) exacerbation: Secondary | ICD-10-CM

## 2016-03-13 DIAGNOSIS — G8929 Other chronic pain: Secondary | ICD-10-CM

## 2016-03-13 MED ORDER — ALBUTEROL SULFATE (2.5 MG/3ML) 0.083% IN NEBU
2.5000 mg | INHALATION_SOLUTION | Freq: Once | RESPIRATORY_TRACT | Status: AC
  Administered 2016-03-13: 2.5 mg via RESPIRATORY_TRACT

## 2016-03-13 MED ORDER — AZITHROMYCIN 250 MG PO TABS: ORAL_TABLET | ORAL | 0 refills | Status: DC

## 2016-03-13 MED ORDER — HYDROCODONE-ACETAMINOPHEN 10-325 MG PO TABS: 1.0000 | ORAL_TABLET | Freq: Four times a day (QID) | ORAL | 0 refills | Status: DC | PRN

## 2016-03-13 MED ORDER — PREDNISONE 50 MG PO TABS: ORAL_TABLET | ORAL | 0 refills | Status: DC

## 2016-03-13 NOTE — Patient Instructions (Addendum)
  It was good to meet you today.  Take the azithromycin 2 pills today and one pill daily after that until finished. This is the antibiotic.  Take the prednisone one pill a day for the next 5 days. This will help the inflammation in your lungs.  Come back and see [place] next week if not feeling any better.  I have refilled your pain medicines as well.   IF you received an x-ray today, you will receive an invoice from [facility]. Please contact [facility] at [phone] with questions or concerns regarding your invoice.   IF you received labwork today, you will receive an invoice from [facility]. Please contact [facility] at [phone] with questions or concerns regarding your invoice.   Our billing staff will not be able to assist you with questions regarding bills from these companies.  You will be contacted with the lab results as soon as they are available. The fastest way to get your results is to activate your My Chart account. Instructions are located on the last page of this paperwork. If you have not heard from [place] regarding the results in 2 weeks, please contact this office.

## 2016-03-13 NOTE — Progress Notes (Signed)
Alexander [name] is a 71 y.o. male who presents to [place] at [facility] today for cough at home:  1.  Cough:  Present for the past 2 weeks or so. However for the past 2 days it has acutely worsened. He has trouble sleeping at night secondary to cough. It is productive of thick yellow sputum. States he developed "shifting in my chest" when he sleeps at night. Unclear if any fevers or chills. Eating and drinking well. No chest pain. No palpitations. No lower extremity edema.  He has long-standing history of smoking. He has been unable to afford any albuterol and has no home inhalers.  ROS as above.   PMH reviewed. Patient is a nonsmoker.   Past Medical History:  Diagnosis Date  . Cancer [facility])    prostate  . Hypertension   . Neuromuscular disorder [facility])    Past Surgical History:  Procedure Laterality Date  . COLON SURGERY     "locked bowel" fixed  . HERNIA REPAIR    . LEFT HEART CATHETERIZATION WITH CORONARY ANGIOGRAM N/A 09/15/2013   Procedure: LEFT HEART CATHETERIZATION WITH CORONARY ANGIOGRAM;  Surgeon: Peter M Martinique, MD;  Location: [facility] CATH LAB;  Service: Cardiovascular;  Laterality: N/A;  . PROSTATE SURGERY  2005    Medications reviewed. Current Outpatient Prescriptions  Medication Sig Dispense Refill  . amLODipine (NORVASC) 5 MG tablet Take 1 tablet (5 mg total) by mouth daily. 90 tablet 1  . aspirin EC 81 MG tablet Take 81 mg by mouth daily.    . Blood Glucose Monitoring Suppl W/DEVICE KIT 1 each by Does not apply route daily. 100 each 3  . cetirizine (ZYRTEC) 10 MG tablet Take 1 tablet (10 mg total) by mouth at bedtime. 90 tablet 1  . gabapentin (NEURONTIN) 300 MG capsule Take 3 capsules (900 mg total) by mouth 3 (three) times daily. 810 capsule 1  . HYDROcodone-acetaminophen (NORCO) 10-325 MG tablet Take 1 tablet by mouth every 6 (six) hours as needed for moderate pain. 60 tablet 0  . hydrOXYzine (ATARAX/VISTARIL) 25 MG tablet Take 1 tablet (25 mg total) by mouth 3  (three) times daily. 30 tablet 0  . metFORMIN (GLUCOPHAGE) 850 MG tablet TAKE 1 TABLET BY MOUTH TWO  TIMES DAILY WITH A MEAL 180 tablet 0  . montelukast (SINGULAIR) 10 MG tablet Take 1 tablet (10 mg total) by mouth at bedtime. 90 tablet 0  . ranitidine (ZANTAC) 150 MG tablet Take 1 tablet (150 mg total) by mouth 2 (two) times daily as needed for heartburn. 180 tablet 3   No current facility-administered medications for this visit.      Physical Exam:  BP (!) 160/90 (BP Location: Right Arm, Patient Position: Sitting, Cuff Size: Small)   Pulse (!) 115   Temp 98.9 F (37.2 C) (Oral)   Resp 18   Ht 5' 5.5" (1.664 m)   Wt 141 lb 12.8 oz (64.3 kg)   SpO2 92%   BMI 23.24 kg/m  Gen:  Alert, cooperative patient who appears stated age in no acute distress.  Vital signs reviewed. HEENT: EOMI,  MMM Pulm:  Some wheezing with fair air movement bilateral lungs. He has persistent crackles in the right middle lobe. These persist despite him coughing. Cardiac:  Regular rate and rhythm without murmur auscultated.  Good S1/S2. Abd:  Soft/nondistended/nontender.   Exts: Non edematous BL  LE, warm and well perfused.   Assessment and Plan:  1.  COPD exacerbation: - With possibility of early  pneumonia. -He has airspace disease in his right middle lobe on chest x-ray which is also heard persistent crackles.  - He did get a albuterol breathing treatment here in clinic. He felt much better afterwards. His lung exam status post albuterol treatment was much improved, with only very minimal scattered wheezing -As before he should be on inhalers. Counseled smoking cessation. He declined any inhalers as he is not able to afford them. -Treating for possible pneumonia and COPD exacerbation with azithromycin plus prednisone. Follow-up if no improvement.  2.  Chronic back pain:   -I did refill his Norco. His last refill for this was back in July 2017. He has chronic sciatica for which she is followed by his  PCP. -He has very sporadic/infrequent Norco use.

## 2016-03-14 MED FILL — AZITHROMYCIN 250 MG TABLET: 250 | 5 days supply | Qty: 6 | Fill #0

## 2016-03-14 MED FILL — predniSONE 50 MG TABS: 50 | 5 days supply | Qty: 5 | Fill #0

## 2016-03-15 MED FILL — HYDROCODON-APAP 10-325: 10-325 | 15 days supply | Qty: 60 | Fill #0

## 2016-07-17 ENCOUNTER — Other Ambulatory Visit: Payer: Self-pay | Admitting: Family Medicine

## 2016-09-04 ENCOUNTER — Other Ambulatory Visit: Payer: Self-pay | Admitting: Family Medicine

## 2016-09-09 ENCOUNTER — Other Ambulatory Visit: Payer: Self-pay

## 2016-09-11 NOTE — Telephone Encounter (Signed)
Please call and schedule patient and appt within 1 month with Dr Shaw/PCP

## 2016-10-14 ENCOUNTER — Encounter: Payer: Self-pay | Admitting: Family Medicine

## 2016-10-14 ENCOUNTER — Ambulatory Visit (INDEPENDENT_AMBULATORY_CARE_PROVIDER_SITE_OTHER): Payer: Medicare Other | Admitting: Family Medicine

## 2016-10-14 VITALS — BP 168/96 | HR 86 | Temp 98.3°F | Resp 18 | Ht 65.5 in | Wt 137.6 lb

## 2016-10-14 DIAGNOSIS — I10 Essential (primary) hypertension: Secondary | ICD-10-CM

## 2016-10-14 DIAGNOSIS — J302 Other seasonal allergic rhinitis: Secondary | ICD-10-CM

## 2016-10-14 MED ORDER — FLUTICASONE PROPIONATE 50 MCG/ACT NA SUSP: 2.0000 | Freq: Every day | NASAL | 6 refills | Status: DC

## 2016-10-14 MED ORDER — AMLODIPINE BESYLATE 10 MG PO TABS: 10.0000 mg | ORAL_TABLET | Freq: Every day | ORAL | 5 refills | Status: DC

## 2016-10-14 MED FILL — AMLODIPINE BESYLATE 10 MG T: 10 | 30 days supply | Qty: 30 | Fill #0

## 2016-10-14 MED FILL — FLUTICASONE PROP 50 MCG SPR: 50 | 30 days supply | Qty: 16 | Fill #0

## 2016-10-14 NOTE — Patient Instructions (Signed)
     IF you received an x-ray today, you will receive an invoice from [facility]. Please contact [facility] at [phone] with questions or concerns regarding your invoice.   IF you received labwork today, you will receive an invoice from [facility]. Please contact [facility] at [phone] with questions or concerns regarding your invoice.   Our billing staff will not be able to assist you with questions regarding bills from these companies.  You will be contacted with the lab results as soon as they are available. The fastest way to get your results is to activate your My Chart account. Instructions are located on the last page of this paperwork. If you have not heard from us regarding the results in 2 weeks, please contact this office.     

## 2016-10-14 NOTE — Progress Notes (Signed)
9/24/201810:29 AM  Alexander [name] 12/26/1945, 71 y.o. male [id number]  Chief Complaint  Patient presents with  . Sinus Problem  . Nasal Congestion    started yesterday   . Headache    HPI:   Patient is a 71 y.o. male with past medical history significant for HTN and DM2 last seen in 2016 who presents today for 2 days of congestion and headache. He denies any sinus pressure, fever, chills, ear pain, cough or SOB. Previous to this he was having sneezing, itchy face, clear rhinorrhea and mild sore throat. He has been taking mucinex which helps.   Depression screen [facility] 2/9 10/14/2016 03/13/2016 08/17/2015  Decreased Interest 0 0 0  Down, Depressed, Hopeless 0 0 0  PHQ - 2 Score 0 0 0    Allergies  Allergen Reactions  . Lisinopril Swelling    angioedema    Current Outpatient Prescriptions on File Prior to Visit  Medication Sig Dispense Refill  . aspirin EC 81 MG tablet Take 81 mg by mouth daily.    . Blood Glucose Monitoring Suppl W/DEVICE KIT 1 each by Does not apply route daily. 100 each 3  . gabapentin (NEURONTIN) 300 MG capsule Take 3 capsules (900 mg total) by mouth 3 (three) times daily. 810 capsule 1  . HYDROcodone-acetaminophen (NORCO) 10-325 MG tablet Take 1 tablet by mouth every 6 (six) hours as needed for moderate pain. 60 tablet 0  . metFORMIN (GLUCOPHAGE) 850 MG tablet TAKE 1 TABLET BY MOUTH TWO  TIMES DAILY WITH A MEAL 60 tablet 0  . montelukast (SINGULAIR) 10 MG tablet TAKE 1 TABLET BY MOUTH AT  BEDTIME 30 tablet 0  . [DISCONTINUED] lisinopril (PRINIVIL,ZESTRIL) 10 MG tablet TAKE ONE TABLET BY MOUTH ONCE DAILY 30 tablet 9   No current facility-administered medications on file prior to visit.     Past Medical History:  Diagnosis Date  . Cancer [facility])    prostate  . Hypertension   . Neuromuscular disorder [facility])     Past Surgical History:  Procedure Laterality Date  . COLON SURGERY     "locked bowel" fixed  . HERNIA REPAIR    . LEFT HEART CATHETERIZATION  WITH CORONARY ANGIOGRAM N/A 09/15/2013   Procedure: LEFT HEART CATHETERIZATION WITH CORONARY ANGIOGRAM;  Surgeon: Peter M Martinique, MD;  Location: [facility] C[facility] LAB;  Service: Cardiovascular;  Laterality: N/A;  . PROSTATE SURGERY  2005    Social History  Substance Use Topics  . Smoking status: Current Every Day Smoker    Packs/day: 0.00    Years: 51.00    Types: Cigarettes  . Smokeless tobacco: Never Used  . Alcohol use No    Family History  Problem Relation Age of Onset  . Diabetes Mother   . Depression Father   . Hyperlipidemia Sister     Review of Systems  Constitutional: Negative for chills and fever.  HENT: Positive for congestion and sore throat. Negative for ear pain and sinus pain.   Respiratory: Negative for cough and shortness of breath.   Cardiovascular: Negative for chest pain, palpitations and leg swelling.  Gastrointestinal: Negative for abdominal pain, nausea and vomiting.     OBJECTIVE:  Blood pressure (!) 168/96, pulse 86, temperature 98.3 F (36.8 C), temperature source Oral, resp. rate 18, height 5' 5.5" (1.664 m), weight 137 lb 9.6 oz (62.4 kg), SpO2 96 %.  Physical Exam  Constitutional: He is oriented to person, place, and time and well-developed, well-nourished, and in no distress.  HENT:  Head: Normocephalic and atraumatic.  Right Ear: Hearing, tympanic membrane, external ear and ear canal normal.  Left Ear: Hearing, tympanic membrane, external ear and ear canal normal.  Nose: Mucosal edema present. Right sinus exhibits no maxillary sinus tenderness and no frontal sinus tenderness. Left sinus exhibits no maxillary sinus tenderness and no frontal sinus tenderness.  Mouth/Throat: Oropharynx is clear and moist. No oropharyngeal exudate.  Eyes: Pupils are equal, round, and reactive to light. Conjunctivae and EOM are normal.  Neck: Neck supple.  Cardiovascular: Normal rate and regular rhythm.  Exam reveals no gallop and no friction rub.   No murmur  heard. Pulmonary/Chest: Effort normal and breath sounds normal. He has no wheezes. He has no rales.  Lymphadenopathy:    He has no cervical adenopathy.  Neurological: He is alert and oriented to person, place, and time. Gait normal.  Skin: Skin is warm and dry.      ASSESSMENT and PLAN:  1. Seasonal allergic rhinitis, unspecified trigger Discussed supportive treatment. Rx flonase. RTC precautions given.  2. Essential hypertension Above goal.  Increasing amlodipine to 80m once a day.  Meds ordered this encounter  Medications  . fluticasone (FLONASE) 50 MCG/ACT nasal spray    Sig: Place 2 sprays into both nostrils daily.    Dispense:  16 g    Refill:  6  . amLODipine (NORVASC) 10 MG tablet    Sig: Take 1 tablet (10 mg total) by mouth daily.    Dispense:  30 tablet    Refill:  5   Patient asked for a refill of his vicodin which has deferred at this visit.  FU 2-4 weeks for HTN/DM visit      IRutherford Guys MD [facility] [place] [id number].  [phone] [phone]

## 2016-10-16 ENCOUNTER — Telehealth: Payer: Self-pay

## 2016-10-16 NOTE — Telephone Encounter (Signed)
Called pt to schedule Medicare Annual Wellness Visit. -nr  

## 2016-10-24 ENCOUNTER — Ambulatory Visit (INDEPENDENT_AMBULATORY_CARE_PROVIDER_SITE_OTHER): Payer: Medicare Other

## 2016-10-24 ENCOUNTER — Ambulatory Visit (INDEPENDENT_AMBULATORY_CARE_PROVIDER_SITE_OTHER): Payer: Medicare Other | Admitting: Family Medicine

## 2016-10-24 ENCOUNTER — Encounter: Payer: Self-pay | Admitting: Family Medicine

## 2016-10-24 ENCOUNTER — Ambulatory Visit (HOSPITAL_COMMUNITY)
Admission: RE | Admit: 2016-10-24 | Discharge: 2016-10-24 | Disposition: A | Discharge status: Home / Self Care | Payer: Medicare Other | Source: Ambulatory Visit | Attending: Vascular Surgery | Admitting: Vascular Surgery

## 2016-10-24 VITALS — BP 130/60 | HR 81 | Temp 98.0°F | Resp 18 | Ht 65.5 in | Wt 135.6 lb

## 2016-10-24 DIAGNOSIS — E1159 Type 2 diabetes mellitus with other circulatory complications: Secondary | ICD-10-CM | POA: Diagnosis not present

## 2016-10-24 DIAGNOSIS — G8929 Other chronic pain: Secondary | ICD-10-CM

## 2016-10-24 DIAGNOSIS — M5416 Radiculopathy, lumbar region: Secondary | ICD-10-CM | POA: Diagnosis not present

## 2016-10-24 DIAGNOSIS — I10 Essential (primary) hypertension: Secondary | ICD-10-CM

## 2016-10-24 DIAGNOSIS — E785 Hyperlipidemia, unspecified: Secondary | ICD-10-CM

## 2016-10-24 DIAGNOSIS — M47816 Spondylosis without myelopathy or radiculopathy, lumbar region: Secondary | ICD-10-CM | POA: Diagnosis not present

## 2016-10-24 DIAGNOSIS — I2510 Atherosclerotic heart disease of native coronary artery without angina pectoris: Secondary | ICD-10-CM

## 2016-10-24 DIAGNOSIS — M79605 Pain in left leg: Secondary | ICD-10-CM

## 2016-10-24 DIAGNOSIS — I7789 Other specified disorders of arteries and arterioles: Secondary | ICD-10-CM | POA: Diagnosis not present

## 2016-10-24 DIAGNOSIS — M545 Low back pain: Secondary | ICD-10-CM

## 2016-10-24 DIAGNOSIS — R0981 Nasal congestion: Secondary | ICD-10-CM

## 2016-10-24 DIAGNOSIS — F17200 Nicotine dependence, unspecified, uncomplicated: Secondary | ICD-10-CM

## 2016-10-24 LAB — POCT URINALYSIS DIP (MANUAL ENTRY)
Bilirubin, UA: NEGATIVE
Glucose, UA: NEGATIVE mg/dL
Ketones, POC UA: NEGATIVE mg/dL
Leukocytes, UA: NEGATIVE
Nitrite, UA: NEGATIVE
PROTEIN UA: NEGATIVE mg/dL
RBC UA: NEGATIVE
SPEC GRAV UA: 1.02 (ref 1.010–1.025)
UROBILINOGEN UA: 0.2 U/dL
pH, UA: 5.5 (ref 5.0–8.0)

## 2016-10-24 LAB — POCT CBC
GRANULOCYTE PERCENT: 54.8 % (ref 37–80)
HEMATOCRIT: 44.4 % (ref 43.5–53.7)
Hemoglobin: 14.7 g/dL (ref 14.1–18.1)
Lymph, poc: 3.2 (ref 0.6–3.4)
MCH: 29.8 pg (ref 27–31.2)
MCHC: 33 g/dL (ref 31.8–35.4)
MCV: 90.2 fL (ref 80–97)
MID (cbc): 0.6 (ref 0–0.9)
MPV: 7.9 fL (ref 0–99.8)
PLATELET COUNT, POC: 251 10*3/uL (ref 142–424)
POC GRANULOCYTE: 4.6 (ref 2–6.9)
POC LYMPH %: 38.3 % (ref 10–50)
POC MID %: 6.9 %M (ref 0–12)
RBC: 4.93 M/uL (ref 4.69–6.13)
RDW, POC: 14.7 %
WBC: 8.4 10*3/uL (ref 4.6–10.2)

## 2016-10-24 LAB — GLUCOSE, POCT (MANUAL RESULT ENTRY): POC Glucose: 140 mg/dl — AB (ref 70–99)

## 2016-10-24 LAB — POCT GLYCOSYLATED HEMOGLOBIN (HGB A1C): Hemoglobin A1C: 6.9

## 2016-10-24 MED ORDER — METFORMIN HCL 850 MG PO TABS: ORAL_TABLET | ORAL | 1 refills | Status: DC

## 2016-10-24 MED ORDER — METHYLPREDNISOLONE ACETATE 80 MG/ML IJ SUSP
80.0000 mg | Freq: Once | INTRAMUSCULAR | Status: AC
  Administered 2016-10-24: 80 mg via INTRAMUSCULAR

## 2016-10-24 MED ORDER — HYDROCODONE-ACETAMINOPHEN 10-325 MG PO TABS: 1.0000 | ORAL_TABLET | Freq: Four times a day (QID) | ORAL | 0 refills | Status: DC | PRN

## 2016-10-24 MED ORDER — MONTELUKAST SODIUM 10 MG PO TABS: 10.0000 mg | ORAL_TABLET | Freq: Every day | ORAL | 1 refills | Status: DC

## 2016-10-24 MED ORDER — GABAPENTIN 300 MG PO CAPS: 900.0000 mg | ORAL_CAPSULE | Freq: Two times a day (BID) | ORAL | 1 refills | Status: DC

## 2016-10-24 NOTE — Progress Notes (Addendum)
Subjective:    Patient ID: Alexander [name], male    DOB: April 04, 1945, 71 y.o.   MRN: [id number] Chief Complaint  Patient presents with  . Medication Refill    NORCO, pt states he would like discuss medications.    Medication Refill  Associated symptoms include arthralgias and myalgias. Pertinent negatives include no chest pain, chills, fever, headaches or weakness.     Alexander [name] is a 71 yo male here today for a  follow-up on his chronic medical conditions. He has DM CAD HLD, HTN and I have not seen him in 15 months.  DMII: hgba1c 6.6 -> 6.9 -> 7.0-> 7.3.  Microalb elev 04/20/15 (pt had acei angioedema).  Sees optho Dr. Katy Fitch annually - last 11/2014.  Taking asa 81 qd.  On metformin 500 bid. No pneumonia vaccines in pt's chart. Foot exam done 10/24/2014. No GI/GU.  No checking cbgs at home  HPL goal <70:  Started on pravastatin 40 3 mos prior as LDL 97->104->113 but it caused his feet to cramp so he stopped taking it. CAD: No prior MI but pt did have abnml stress  HTN: On amlodipine 32m qd. (had acei angioedema). Last BP was above goal.  Not checking BP outside office.  No lower ext edema.  Lumbar DDD with LLExt radiculopathy: On gabapentin 900 bid with rare hydrocodone for years.  We tried flexeril at last visit but due to complications with insurance he was never able to get it and doesn't feel he needs it anymore.  Was sched for L4-5, L5-S1 lumbar decompression on 09/14/13 by Dr. RKarie Chimera Ended up have cardiac cath instead.  THen decided that he wouldn't want to do surgery unless things got worse.  Taking 9025mgabapentin bid - has been getting through the mail.  He states he has pins and needles in his calf with numbness in his foot - has been intermittent sxs for the past several weeks and currently has pain starting yesterday. Occ has low back pain when disc moves but none currently. + weakness but no falls. NKI.  Does get worse with exertion and better with rest. No nighttime sxs. Not  No swelling, no rash or color changes.  No bowel bladder changes. No polyuria, no polydypsia, no increase appetite, no vision change.   C/o sinus drainage.  Smoking <1ppd  Past Medical History:  [facility]osis Date  . [facility]   [facility]  . Hypertension   . [facility]   Past Surgical History:  Procedure Laterality Date  . COLON SURGERY     "locked bowel" fixed  . HERNIA REPAIR    . LEFT HEART CATHETERIZATION WITH CORONARY ANGIOGRAM N/A 09/15/2013   Procedure: LEFT HEART CATHETERIZATION WITH CORONARY ANGIOGRAM;  Surgeon: Peter M JoMartiniqueMD;  [facility];  Service: Cardiovascular;  Laterality: N/A;  . PROSTATE SURGERY  2005   Current Outpatient Prescriptions on File Prior to Visit  Medication Sig Dispense Refill  . amLODipine (NORVASC) 10 MG tablet Take 1 tablet (10 mg total) by mouth daily. 30 tablet 5  . aspirin EC 81 MG tablet Take 81 mg by mouth daily.    . Blood Glucose Monitoring Suppl W/DEVICE KIT 1 each by Does not apply route daily. 100 each 3  . fluticasone (FLONASE) 50 MCG/ACT nasal spray Place 2 sprays into both nostrils daily. 16 g 6  . gabapentin (NEURONTIN) 300 MG capsule Take 3 capsules (900 mg total) by mouth 3 (three) times daily. 810 capsule  1  . HYDROcodone-acetaminophen (NORCO) 10-325 MG tablet Take 1 tablet by mouth every 6 (six) hours as needed for moderate pain. 60 tablet 0  . metFORMIN (GLUCOPHAGE) 850 MG tablet TAKE 1 TABLET BY MOUTH TWO  TIMES DAILY WITH A MEAL 60 tablet 0  . montelukast (SINGULAIR) 10 MG tablet TAKE 1 TABLET BY MOUTH AT  BEDTIME 30 tablet 0  . [DISCONTINUED] lisinopril (PRINIVIL,ZESTRIL) 10 MG tablet TAKE ONE TABLET BY MOUTH ONCE DAILY 30 tablet 9   No current facility-administered medications on file prior to visit.    Allergies  Allergen Reactions  . Lisinopril Swelling    angioedema   Family History  Problem Relation Age of Onset  . Diabetes Mother   . Depression Father   . Hyperlipidemia Sister     Social History   Social History  . Marital status: Widowed    Spouse name: N/A  . Number of children: 2  . Years of education: 9   Occupational History  . retired    Social History Main Topics  . Smoking status: Current Every Day Smoker    Packs/day: 0.00    Years: 51.00    Types: Cigarettes  . Smokeless tobacco: Never Used  . Alcohol use No  . Drug use: No  . Sexual activity: Yes   Other Topics Concern  . None   Social History Narrative   Patient is single and his daughter lives with him.   Patient has two children.   Patient has a 9th grade education.   Patient works in [employer] (part-time).   Patient drinks one to two cups of coffee daily.   Patient is right-handed.         Depression screen [facility] 2/9 10/24/2016 10/14/2016 03/13/2016 08/17/2015 04/20/2015  Decreased Interest 0 0 0 0 0  Down, Depressed, Hopeless 0 0 0 0 0  PHQ - 2 Score 0 0 0 0 0     Review of Systems  Constitutional: Negative for chills and fever.  Eyes: Negative for visual disturbance.  Respiratory: Negative for chest tightness and shortness of breath.   Cardiovascular: Negative for chest pain and leg swelling.  Musculoskeletal: Positive for arthralgias, back pain and myalgias.  Neurological: Negative for dizziness, syncope, facial asymmetry, weakness, light-headedness and headaches.       Objective:   Physical Exam  Constitutional: He is oriented to person, place, and time. He appears well-developed and well-nourished. No distress.  HENT:  Head: Normocephalic and atraumatic.  Eyes: Pupils are equal, round, and reactive to light. Conjunctivae are normal. No scleral icterus.  Neck: Normal range of motion. Neck supple. No thyromegaly present.  Cardiovascular: Normal rate, regular rhythm, normal heart sounds and intact distal pulses.   Pulmonary/Chest: Effort normal. No respiratory distress. He has decreased breath sounds.  Musculoskeletal: He exhibits no edema.  Lymphadenopathy:    He  has no cervical adenopathy.  Neurological: He is alert and oriented to person, place, and time.  Skin: Skin is warm and dry. He is not diaphoretic.  Psychiatric: He has a normal mood and affect. His behavior is normal.     BP 140/80 (BP Location: Right Arm, Patient Position: Sitting, Cuff Size: Normal)   Pulse 81   Temp 98 F (36.7 C) (Oral)   Resp 18   Ht 5' 5.5" (1.664 m)   Wt 135 lb 9.6 oz (61.5 kg)   SpO2 96%   BMI 22.22 kg/m      Results for orders placed or performed in  visit on 10/24/16  POCT glycosylated hemoglobin (Hb A1C)  Result Value Ref Range   Hemoglobin A1C 6.9   POCT glucose (manual entry)  Result Value Ref Range   POC Glucose 140 (A) 70 - 99 mg/dl  POCT CBC  Result Value Ref Range   WBC 8.4 4.6 - 10.2 K/uL   Lymph, poc 3.2 0.6 - 3.4   POC LYMPH PERCENT 38.3 10 - 50 %L   MID (cbc) 0.6 0 - 0.9   POC MID % 6.9 0 - 12 %M   POC Granulocyte 4.6 2 - 6.9   Granulocyte percent 54.8 37 - 80 %G   RBC 4.93 4.69 - 6.13 M/uL   Hemoglobin 14.7 14.1 - 18.1 g/dL   HCT, POC 44.4 43.5 - 53.7 %   MCV 90.2 80 - 97 fL   MCH, POC 29.8 27 - 31.2 pg   MCHC 33.0 31.8 - 35.4 g/dL   RDW, POC 14.7 %   Platelet Count, POC 251 142 - 424 K/uL   MPV 7.9 0 - 99.8 fL  POCT urinalysis dipstick  Result Value Ref Range   Color, UA yellow yellow   Clarity, UA clear clear   Glucose, UA negative negative mg/dL   Bilirubin, UA negative negative   Ketones, POC UA negative negative mg/dL   Spec Grav, UA 1.020 1.010 - 1.025   Blood, UA negative negative   pH, UA 5.5 5.0 - 8.0   Protein Ur, POC negative negative mg/dL   Urobilinogen, UA 0.2 0.2 or 1.0 E.U./dL   Nitrite, UA Negative Negative   Leukocytes, UA Negative Negative   Dg Lumbar Spine Complete  Result Date: 10/24/2016 CLINICAL DATA:  Chronic pain in the left lower extremity. EXAM: LUMBAR SPINE - COMPLETE 4+ VIEW COMPARISON:  10/27/2012 MRI FINDINGS: Generalized spondylotic spurring. Advanced disc degeneration with narrowing at  L4-5 and L5-S1. Lower lumbar facet arthropathy with bulkiest spurring on the left at L4-5. No evidence of fracture, endplate erosion, or focal bone lesion. IMPRESSION: 1. Disc and facet degeneration that is advanced at L4-5 and L5-S1. There was compressive spinal and foraminal stenosis at L4-5 on a 2014 MRI. 2. No acute finding. Electronically Signed   By: Monte Fantasia M.D.   On: 10/24/2016 09:01    Assessment & Plan:   prevnar today, pneumovax next yr but pt declined both Check flp at f/u visit in 4 mos. Get shingles vaccine   Recheck in 4 mo  1. Chronic pain of left lower extremity - has had for many years as was planning to under L4-5, L5-S1 decompression w/ Dr. Sung Amabile [phone] but surg was put on hold for a cardiac cath after which pt decided his sxs were tolerable w/ medical mngmnt, pt denies sig overall worsening but currently having flair - reports he has done well on steroid therapy prior despite DM so will try DepoMedrol 69m IM - watch cbgs closely and strict low carb diet adherence for next few d. Refilled chronic gabapentin with rare prn hydrocodone.  2. Type 2 diabetes mellitus with other circulatory complications (HCC) - at upper end of goal a1c 6.9, - will try increasing metformin 500 bid to 850 bid  3. Left lumbar radiculopathy   4. HTN (hypertension), benign   5. Coronary artery disease involving native coronary artery of native heart without angina pectoris   6. Tobacco use disorder   7. Hyperlipidemia LDL goal <70 - did not tolerate pravastatin 40 due to worsening lower ext/foot cramps  8. Sinus congestion   9. Chronic low back pain, unspecified back pain laterality, with sciatica presence unspecified     Orders Placed This Encounter  Procedures  . DG Lumbar Spine Complete    Standing Status:   Future    Number of Occurrences:   1    Standing Expiration Date:   10/24/2017    Order Specific Question:   Reason for Exam (SYMPTOM  OR DIAGNOSIS REQUIRED)    Answer:   h/o  severe lumbar spinal stenosis - worsening left calf and foot paresthesias    Order Specific Question:   Preferred imaging location?    Answer:   External  . Comprehensive metabolic panel    Order Specific Question:   Has the patient fasted?    Answer:   Yes  . Lipid panel    Order Specific Question:   Has the patient fasted?    Answer:   Yes  . Microalbumin/Creatinine Ratio, Urine  . POCT glycosylated hemoglobin (Hb A1C)  . POCT glucose (manual entry)  . POCT CBC  . POCT urinalysis dipstick  . HM DIABETES FOOT EXAM    Meds ordered this encounter  Medications  . methylPREDNISolone acetate (DEPO-MEDROL) injection 80 mg  . montelukast (SINGULAIR) 10 MG tablet    Sig: Take 1 tablet (10 mg total) by mouth at bedtime.    Dispense:  90 tablet    Refill:  1  . HYDROcodone-acetaminophen (NORCO) 10-325 MG tablet    Sig: Take 1 tablet by mouth every 6 (six) hours as needed for moderate pain.    Dispense:  60 tablet    Refill:  0  . gabapentin (NEURONTIN) 300 MG capsule    Sig: Take 3 capsules (900 mg total) by mouth 2 (two) times daily.    Dispense:  540 capsule    Refill:  1  . metFORMIN (GLUCOPHAGE) 850 MG tablet    Sig: TAKE 1 TABLET BY MOUTH TWO  TIMES DAILY WITH A MEAL    Dispense:  180 tablet    Refill:  1    [name], M.D.  [facility] [address] [place], [place] [address] [phone] phone [phone] fax  10/24/16 8:16 AM

## 2016-10-24 NOTE — Patient Instructions (Signed)
     IF you received an x-ray today, you will receive an invoice from [facility]. Please contact [facility] at [phone] with questions or concerns regarding your invoice.   IF you received labwork today, you will receive an invoice from [facility]. Please contact [facility] at [phone] with questions or concerns regarding your invoice.   Our billing staff will not be able to assist you with questions regarding bills from these companies.  You will be contacted with the lab results as soon as they are available. The fastest way to get your results is to activate your My Chart account. Instructions are located on the last page of this paperwork. If you have not heard from us regarding the results in 2 weeks, please contact this office.     

## 2016-10-25 LAB — MICROALBUMIN / CREATININE URINE RATIO
Creatinine, Urine: 101 mg/dL
Microalb/Creat Ratio: 20.8 mg/g creat (ref 0.0–30.0)
Microalbumin, Urine: 21 ug/mL

## 2016-10-25 LAB — COMPREHENSIVE METABOLIC PANEL
A/G RATIO: 1.4 (ref 1.2–2.2)
ALBUMIN: 4.6 g/dL (ref 3.5–4.8)
ALT: 10 IU/L (ref 0–44)
AST: 16 IU/L (ref 0–40)
Alkaline Phosphatase: 119 IU/L — ABNORMAL HIGH (ref 39–117)
BILIRUBIN TOTAL: 0.3 mg/dL (ref 0.0–1.2)
BUN / CREAT RATIO: 10 (ref 10–24)
BUN: 10 mg/dL (ref 8–27)
CALCIUM: 10.3 mg/dL — AB (ref 8.6–10.2)
CHLORIDE: 100 mmol/L (ref 96–106)
CO2: 22 mmol/L (ref 20–29)
Creatinine, Ser: 0.96 mg/dL (ref 0.76–1.27)
GFR, EST AFRICAN AMERICAN: 92 mL/min/{1.73_m2} (ref >59)
GFR, EST NON AFRICAN AMERICAN: 80 mL/min/{1.73_m2} (ref >59)
GLOBULIN, TOTAL: 3.3 g/dL (ref 1.5–4.5)
Glucose: 125 mg/dL — ABNORMAL HIGH (ref 65–99)
POTASSIUM: 4.4 mmol/L (ref 3.5–5.2)
SODIUM: 139 mmol/L (ref 134–144)
TOTAL PROTEIN: 7.9 g/dL (ref 6.0–8.5)

## 2016-10-25 LAB — LIPID PANEL
CHOL/HDL RATIO: 3.8 ratio (ref 0.0–5.0)
Cholesterol, Total: 168 mg/dL (ref 100–199)
HDL: 44 mg/dL (ref >39)
LDL Calculated: 109 mg/dL — ABNORMAL HIGH (ref 0–99)
Triglycerides: 75 mg/dL (ref 0–149)
VLDL CHOLESTEROL CAL: 15 mg/dL (ref 5–40)

## 2016-10-25 MED FILL — HYDROCODON-APAP 10-325: 10-325 | 15 days supply | Qty: 60 | Fill #0

## 2016-10-28 ENCOUNTER — Ambulatory Visit: Payer: Medicare Other | Admitting: Family Medicine

## 2016-10-28 ENCOUNTER — Telehealth: Payer: Self-pay | Admitting: Family Medicine

## 2016-10-28 ENCOUNTER — Ambulatory Visit (INDEPENDENT_AMBULATORY_CARE_PROVIDER_SITE_OTHER): Payer: Medicare Other

## 2016-10-28 VITALS — BP 142/82 | HR 82 | Ht 66.0 in | Wt 135.2 lb

## 2016-10-28 DIAGNOSIS — I779 Disorder of arteries and arterioles, unspecified: Secondary | ICD-10-CM

## 2016-10-28 DIAGNOSIS — Z0000 Encounter for general adult medical examination without abnormal findings: Secondary | ICD-10-CM

## 2016-10-28 DIAGNOSIS — Z972 Presence of dental prosthetic device (complete) (partial): Secondary | ICD-10-CM

## 2016-10-28 DIAGNOSIS — I739 Peripheral vascular disease, unspecified: Secondary | ICD-10-CM

## 2016-10-28 DIAGNOSIS — K0889 Other specified disorders of teeth and supporting structures: Secondary | ICD-10-CM | POA: Diagnosis not present

## 2016-10-28 NOTE — Progress Notes (Signed)
Subjective:   Alexander [name] is a 71 y.o. male who presents for an Initial Medicare Annual Wellness Visit.  Review of Systems  N/A Cardiac Risk Factors include: advanced age (>18mn, >>18women);diabetes mellitus;hypertension;dyslipidemia;smoking/ tobacco exposure;male gender    Objective:    Today's Vitals   10/28/16 0912  BP: (!) 142/82  Pulse: 82  SpO2: 96%  Weight: 135 lb 4 oz (61.3 kg)  Height: 5' 6"  (1.676 m)   Body mass index is 21.83 kg/m.  Current Medications (verified) Outpatient Encounter Prescriptions as of 10/28/2016  Medication Sig  . amLODipine (NORVASC) 10 MG tablet Take 1 tablet (10 mg total) by mouth daily.  .Marland Kitchenaspirin EC 81 MG tablet Take 81 mg by mouth daily.  . Blood Glucose Monitoring Suppl W/DEVICE KIT 1 each by Does not apply route daily.  . fluticasone (FLONASE) 50 MCG/ACT nasal spray Place 2 sprays into both nostrils daily.  .Marland Kitchengabapentin (NEURONTIN) 300 MG capsule Take 3 capsules (900 mg total) by mouth 2 (two) times daily.  .Marland KitchenHYDROcodone-acetaminophen (NORCO) 10-325 MG tablet Take 1 tablet by mouth every 6 (six) hours as needed for moderate pain.  . metFORMIN (GLUCOPHAGE) 850 MG tablet TAKE 1 TABLET BY MOUTH TWO  TIMES DAILY WITH A MEAL  . montelukast (SINGULAIR) 10 MG tablet Take 1 tablet (10 mg total) by mouth at bedtime.   No facility-administered encounter medications on file as of 10/28/2016.     Allergies (verified) Lisinopril   History: Past Medical History:  Diagnosis Date  . Cancer ([facility]    prostate  . Hypertension   . Neuromuscular disorder ([facility]    Past Surgical History:  Procedure Laterality Date  . COLON SURGERY     "locked bowel" fixed  . HERNIA REPAIR    . LEFT HEART CATHETERIZATION WITH CORONARY ANGIOGRAM N/A 09/15/2013   Procedure: LEFT HEART CATHETERIZATION WITH CORONARY ANGIOGRAM;  Surgeon: Peter M JMartinique MD;  [facility];  Service: Cardiovascular;  Laterality: N/A;  . PROSTATE SURGERY  2005   Family  History  Problem Relation Age of Onset  . Diabetes Mother   . Depression Father   . Hyperlipidemia Sister    Social History   Occupational History  . retired    Social History Main Topics  . Smoking status: Current Every Day Smoker    Packs/day: 0.00    Years: 51.00    Types: Cigarettes  . Smokeless tobacco: Never Used  . Alcohol use No  . Drug use: No  . Sexual activity: Yes   Tobacco Counseling Ready to quit: Yes Counseling given: Not Answered   Activities of Daily Living In your present state of health, do you have any difficulty performing the following activities: 10/28/2016  Hearing? N  Vision? N  Difficulty concentrating or making decisions? N  Walking or climbing stairs? N  Dressing or bathing? N  Doing errands, shopping? N  Preparing Food and eating ? N  Using the Toilet? N  In the past six months, have you accidently leaked urine? N  Do you have problems with loss of bowel control? N  Managing your Medications? N  Managing your Finances? N  Housekeeping or managing your Housekeeping? N  Some recent data might be hidden    Immunizations and Health Maintenance Immunization History  Administered Date(s) Administered  . Td 07/17/2014   Health Maintenance Due  Topic Date Due  . OPHTHALMOLOGY EXAM  11/22/2015    Patient Care Team: SShawnee Knapp MD as  PCP - General (Family Medicine)  Indicate any recent Medical Services you may have received from other than [facility] providers in the past year (date may be approximate).    Assessment:   This is a routine wellness examination for Alexander [name].   Hearing/Vision screen Vision Screening Comments: Patient does not see an eye doctor on a regular basis.   Dietary issues and exercise activities discussed: Current Exercise Habits: The patient does not participate in regular exercise at present (stays active working ), Exercise limited by: None identified  Goals    . Quit smoking / using tobacco          Patient  states that he would like to try to quit smoking in the near future.       Depression Screen PHQ 2/9 Scores 10/28/2016 10/24/2016 10/14/2016 03/13/2016  PHQ - 2 Score 0 0 0 0    Fall Risk Fall Risk  10/28/2016 10/24/2016 10/14/2016 03/13/2016 08/17/2015  Falls in the past year? No No No No Yes  Number falls in past yr: - - - - 1  Injury with Fall? - - - - No    Cognitive Function:     6CIT Screen 10/28/2016  What Year? 0 points  What month? 0 points  What time? 0 points  Count back from 20 0 points  Months in reverse 4 points  Repeat phrase 2 points  Total Score 6    Screening Tests Health Maintenance  Topic Date Due  . OPHTHALMOLOGY EXAM  11/22/2015  . INFLUENZA VACCINE  11/21/2016 (Originally 08/21/2016)  . PNA vac Low Risk Adult (1 of 2 - PCV13) 10/28/2017 (Originally 03/21/2011)  . HEMOGLOBIN A1C  04/24/2017  . FOOT EXAM  10/24/2017  . URINE MICROALBUMIN  10/24/2017  . COLONOSCOPY  11/04/2022  . TETANUS/TDAP  07/16/2024  . Hepatitis C Screening  Completed        Plan:   I have personally reviewed and noted the following in the patient's chart:   . Medical and social history . Use of alcohol, tobacco or illicit drugs  . Current medications and supplements . Functional ability and status . Nutritional status . Physical activity . Advanced directives . List of other physicians . Hospitalizations, surgeries, and ER visits in previous 12 months . Vitals . Screenings to include cognitive, depression, and falls . Referrals and appointments  In addition, I have reviewed and discussed with patient certain preventive protocols, quality metrics, and best practice recommendations. A written personalized care plan for preventive services as well as general preventive health recommendations were provided to patient.  Patient declined flu, pneumonia and shingles vaccines.    Andrez Grime, LPN   27/07/4126

## 2016-10-28 NOTE — Telephone Encounter (Signed)
Pt's blood pressure in his legs shows that he does have peripheral arterial disease - the blood flow to his legs is reduced. I have referred him to vascular surgery to see if we need to think about doing anything else to treat this.

## 2016-10-28 NOTE — Patient Instructions (Addendum)
Alexander [name] , Thank you for taking time to come for your Medicare Wellness Visit. I appreciate your ongoing commitment to your health goals. Please review the following plan we discussed and let me know if I can assist you in the future.   Screening recommendations/referrals: Colonoscopy: up to date, next due 11/04/2022 Recommended yearly ophthalmology/optometry visit for glaucoma screening and checkup Recommended yearly dental visit for hygiene and checkup  Vaccinations: Influenza vaccine: declined  Pneumococcal vaccine: declined Tdap vaccine: up to date, next due 07/16/2024 Shingles vaccine: declined     Advanced directives: Please bring a copy of your POA (Power of [place]) and/or Living Will to your next appointment.   Conditions/risks identified: Try to quit smoking in the near future. This will help tremendously with your overall health.   Next appointment: 02/24/17 @ 8:20 am with Dr. Brigitte Pulse   Preventive Care 65 Years and Older, Male Preventive care refers to lifestyle choices and visits with your health care provider that can promote health and wellness. What does preventive care include?  A yearly physical exam. This is also called an annual well check.  Dental exams once or twice a year.  Routine eye exams. Ask your health care provider how often you should have your eyes checked.  Personal lifestyle choices, including:  Daily care of your teeth and gums.  Regular physical activity.  Eating a healthy diet.  Avoiding tobacco and drug use.  Limiting alcohol use.  Practicing safe sex.  Taking low doses of aspirin every day.  Taking vitamin and mineral supplements as recommended by your health care provider. What happens during an annual well check? The services and screenings done by your health care provider during your annual well check will depend on your age, overall health, lifestyle risk factors, and family history of disease. Counseling  Your health care  provider may ask you questions about your:  Alcohol use.  Tobacco use.  Drug use.  Emotional well-being.  Home and relationship well-being.  Sexual activity.  Eating habits.  History of falls.  Memory and ability to understand (cognition).  Work and work Statistician. Screening  You may have the following tests or measurements:  Height, weight, and BMI.  Blood pressure.  Lipid and cholesterol levels. These may be checked every 5 years, or more frequently if you are over 20 years old.  Skin check.  Lung cancer screening. You may have this screening every year starting at age 21 if you have a 30-pack-year history of smoking and currently smoke or have quit within the past 15 years.  Fecal occult blood test (FOBT) of the stool. You may have this test every year starting at age 20.  Flexible sigmoidoscopy or colonoscopy. You may have a sigmoidoscopy every 5 years or a colonoscopy every 10 years starting at age 15.  Prostate cancer screening. Recommendations will vary depending on your family history and other risks.  Hepatitis C blood test.  Hepatitis B blood test.  Sexually transmitted disease (STD) testing.  Diabetes screening. This is done by checking your blood sugar (glucose) after you have not eaten for a while (fasting). You may have this done every 1-3 years.  Abdominal aortic aneurysm (AAA) screening. You may need this if you are a current or former smoker.  Osteoporosis. You may be screened starting at age 86 if you are at high risk. Talk with your health care provider about your test results, treatment options, and if necessary, the need for more tests. Vaccines  Your health  care provider may recommend certain vaccines, such as:  Influenza vaccine. This is recommended every year.  Tetanus, diphtheria, and acellular pertussis (Tdap, Td) vaccine. You may need a Td booster every 10 years.  Zoster vaccine. You may need this after age 66.  Pneumococcal  13-valent conjugate (PCV13) vaccine. One dose is recommended after age 28.  Pneumococcal polysaccharide (PPSV23) vaccine. One dose is recommended after age 60. Talk to your health care provider about which screenings and vaccines you need and how often you need them. This information is not intended to replace advice given to you by your health care provider. Make sure you discuss any questions you have with your health care provider. Document Released: 02/03/2015 Document Revised: 09/27/2015 Document Reviewed: 11/08/2014 Elsevier Interactive Patient Education  2017 [place] [employer] Prevention in the Home Falls can cause injuries. They can happen to people of all ages. There are many things you can do to make your home safe and to help prevent falls. What can I do on the outside of my home?  Regularly fix the edges of walkways and driveways and fix any cracks.  Remove anything that might make you trip as you walk through a door, such as a raised step or threshold.  Trim any bushes or trees on the path to your home.  Use bright outdoor lighting.  Clear any walking paths of anything that might make someone trip, such as rocks or tools.  Regularly check to see if handrails are loose or broken. Make sure that both sides of any steps have handrails.  Any raised decks and porches should have guardrails on the edges.  Have any leaves, snow, or ice cleared regularly.  Use sand or salt on walking paths during winter.  Clean up any spills in your garage right away. This includes oil or grease spills. What can I do in the bathroom?  Use night lights.  Install grab bars by the toilet and in the tub and shower. Do not use towel bars as grab bars.  Use non-skid mats or decals in the tub or shower.  If you need to sit down in the shower, use a plastic, non-slip stool.  Keep the floor dry. Clean up any water that spills on the floor as soon as it happens.  Remove soap buildup in the  tub or shower regularly.  Attach bath mats securely with double-sided non-slip rug tape.  Do not have throw rugs and other things on the floor that can make you trip. What can I do in the bedroom?  Use night lights.  Make sure that you have a light by your bed that is easy to reach.  Do not use any sheets or blankets that are too big for your bed. They should not hang down onto the floor.  Have a firm chair that has side arms. You can use this for support while you get dressed.  Do not have throw rugs and other things on the floor that can make you trip. What can I do in the kitchen?  Clean up any spills right away.  Avoid walking on wet floors.  Keep items that you use a lot in easy-to-reach places.  If you need to reach something above you, use a strong step stool that has a grab bar.  Keep electrical cords out of the way.  Do not use floor polish or wax that makes floors slippery. If you must use wax, use non-skid floor wax.  Do not have  throw rugs and other things on the floor that can make you trip. What can I do with my stairs?  Do not leave any items on the stairs.  Make sure that there are handrails on both sides of the stairs and use them. Fix handrails that are broken or loose. Make sure that handrails are as long as the stairways.  Check any carpeting to make sure that it is firmly attached to the stairs. Fix any carpet that is loose or worn.  Avoid having throw rugs at the top or bottom of the stairs. If you do have throw rugs, attach them to the floor with carpet tape.  Make sure that you have a light switch at the top of the stairs and the bottom of the stairs. If you do not have them, ask someone to add them for you. What else can I do to help prevent falls?  Wear shoes that:  Do not have high heels.  Have rubber bottoms.  Are comfortable and fit you well.  Are closed at the toe. Do not wear sandals.  If you use a stepladder:  Make sure that it  is fully opened. Do not climb a closed stepladder.  Make sure that both sides of the stepladder are locked into place.  Ask someone to hold it for you, if possible.  Clearly mark and make sure that you can see:  Any grab bars or handrails.  First and last steps.  Where the edge of each step is.  Use tools that help you move around (mobility aids) if they are needed. These include:  Canes.  Walkers.  Scooters.  Crutches.  Turn on the lights when you go into a dark area. Replace any light bulbs as soon as they burn out.  Set up your furniture so you have a clear path. Avoid moving your furniture around.  If any of your floors are uneven, fix them.  If there are any pets around you, be aware of where they are.  Review your medicines with your doctor. Some medicines can make you feel dizzy. This can increase your chance of falling. Ask your doctor what other things that you can do to help prevent falls. This information is not intended to replace advice given to you by your health care provider. Make sure you discuss any questions you have with your health care provider. Document Released: 11/03/2008 Document Revised: 06/15/2015 Document Reviewed: 02/11/2014 Elsevier Interactive Patient Education  2017 [employer].

## 2016-11-08 ENCOUNTER — Telehealth: Payer: Self-pay | Admitting: Family Medicine

## 2016-11-08 NOTE — Telephone Encounter (Signed)
Pt is requesting that something be called in for leg cramps  Best number [phone]

## 2016-11-14 ENCOUNTER — Encounter: Payer: Self-pay | Admitting: Vascular Surgery

## 2016-11-14 ENCOUNTER — Ambulatory Visit (INDEPENDENT_AMBULATORY_CARE_PROVIDER_SITE_OTHER): Payer: Medicare Other | Admitting: Vascular Surgery

## 2016-11-14 VITALS — BP 150/87 | HR 78 | Resp 20 | Ht 66.0 in | Wt 135.0 lb

## 2016-11-14 DIAGNOSIS — Z720 Tobacco use: Secondary | ICD-10-CM

## 2016-11-14 DIAGNOSIS — I739 Peripheral vascular disease, unspecified: Secondary | ICD-10-CM

## 2016-11-14 DIAGNOSIS — I779 Disorder of arteries and arterioles, unspecified: Secondary | ICD-10-CM

## 2016-11-14 DIAGNOSIS — F17210 Nicotine dependence, cigarettes, uncomplicated: Secondary | ICD-10-CM | POA: Diagnosis not present

## 2016-11-14 HISTORY — DX: Peripheral vascular disease, unspecified: I73.9

## 2016-11-14 MED ORDER — TIZANIDINE HCL 4 MG PO TABS
4.0000 mg | ORAL_TABLET | Freq: Three times a day (TID) | ORAL | 1 refills | Status: DC | PRN
Start: 2016-11-14 — End: 2016-12-04

## 2016-11-14 NOTE — Telephone Encounter (Signed)
Pt message sent to [facility] for review.

## 2016-11-14 NOTE — Progress Notes (Signed)
Requested by Dr. [name]  CC pins and needles in left foot/calf with night time cramps  History of Present Illness:  Patient is a 71 y.o. year old male who presents for evaluation of PAD without symptoms of claudication.  He states he has cramps in the left foot at night whhich goes away if he stretches and gets up and walks.  He states he can walk as far as he likes and he works 5-6 days a week.   There is not rest pain.  There is no history of ulcerations on the feet.  Atherosclerotic risk factors and other medical problems include DM, Hypercholesterolemia managed with a statin, HTN managed with Norvasc, as well as a daily aspirin.  Past Medical History:  Diagnosis Date  . Cancer [facility])    prostate  . Diabetes mellitus without complication ([place])   . Hypertension   . Neuromuscular disorder [facility])     Past Surgical History:  Procedure Laterality Date  . COLON SURGERY     "locked bowel" fixed  . HERNIA REPAIR    . LEFT HEART CATHETERIZATION WITH CORONARY ANGIOGRAM N/A 09/15/2013   Procedure: LEFT HEART CATHETERIZATION WITH CORONARY ANGIOGRAM;  Surgeon: Peter M Martinique, MD;  Location: [facility] C[facility] LAB;  Service: Cardiovascular;  Laterality: N/A;  . PROSTATE SURGERY  2005    ROS:   General:  No weight loss, Fever, chills  HEENT: No recent headaches, no nasal bleeding, no visual changes, no sore throat  Neurologic: No dizziness, blackouts, seizures. No recent symptoms of stroke or mini- stroke. No recent episodes of slurred speech, or temporary blindness.  Cardiac: No recent episodes of chest pain/pressure, no shortness of breath at rest.  No shortness of breath with exertion.  Denies history of atrial fibrillation or irregular heartbeat  Vascular: No history of rest pain in feet.  No history of claudication.  No history of non-healing ulcer, No history of DVT   Pulmonary: No home oxygen, no productive cough, no hemoptysis,  No asthma or wheezing  Musculoskeletal:  [x ]  Arthritis, [x ] Low back pain,  [ ]  Joint pain  Hematologic:No history of hypercoagulable state.  No history of easy bleeding.  No history of anemia  Gastrointestinal: No hematochezia or melena,  No gastroesophageal reflux, no trouble swallowing  Urinary: [ ]  chronic Kidney disease, [ ]  on HD - [ ]  MWF or [ ]  TTHS, [ ]  Burning with urination, [ ]  Frequent urination, [ ]  Difficulty urinating;   Skin: No rashes  Psychological: No history of anxiety,  No history of depression  Social History Social History  Substance Use Topics  . Smoking status: Current Every Day Smoker    Packs/day: 0.00    Years: 51.00    Types: Cigarettes  . Smokeless tobacco: Never Used  . Alcohol use No    Family History Family History  Problem Relation Age of Onset  . Diabetes Mother   . Depression Father   . Hyperlipidemia Sister     Allergies  Allergies  Allergen Reactions  . Lisinopril Swelling    angioedema     Current Outpatient Prescriptions  Medication Sig Dispense Refill  . amLODipine (NORVASC) 10 MG tablet Take 1 tablet (10 mg total) by mouth daily. 30 tablet 5  . aspirin EC 81 MG tablet Take 81 mg by mouth daily.    . Blood Glucose Monitoring Suppl W/DEVICE KIT 1 each by Does not apply route daily. 100 each 3  .  fluticasone (FLONASE) 50 MCG/ACT nasal spray Place 2 sprays into both nostrils daily. 16 g 6  . gabapentin (NEURONTIN) 300 MG capsule Take 3 capsules (900 mg total) by mouth 2 (two) times daily. 540 capsule 1  . HYDROcodone-acetaminophen (NORCO) 10-325 MG tablet Take 1 tablet by mouth every 6 (six) hours as needed for moderate pain. 60 tablet 0  . metFORMIN (GLUCOPHAGE) 850 MG tablet TAKE 1 TABLET BY MOUTH TWO  TIMES DAILY WITH A MEAL 180 tablet 1  . montelukast (SINGULAIR) 10 MG tablet Take 1 tablet (10 mg total) by mouth at bedtime. 90 tablet 1   No current facility-administered medications for this visit.     Physical Examination  Vitals:   11/14/16 1402 11/14/16 1415    BP: (!) 144/89 (!) 150/87  Pulse: 78   Resp: 20   SpO2: 96%   Weight: 135 lb (61.2 kg)   Height: 5' 6"  (1.676 m)     Body mass index is 21.79 kg/m.  General:  Alert and oriented, no acute distress HEENT: Normal Neck: No bruit or JVD Pulmonary: Clear to auscultation bilaterally Cardiac: Regular Rate and Rhythm without murmur Abdomen: Soft, non-tender, non-distended, no mass, no scars Skin: No rash Extremity Pulses:  2+ radial, brachial, femoral, dorsalis pedis, pulses bilaterally Musculoskeletal: No deformity or edema  Neurologic: Upper and lower extremity motor 5/5 and symmetric  DATA:  ABI 0.68 % arterial flow bilateral LE Right monophasic flow and left Biphasic/monophasic flow  Lumbar x ray 10/24/2016 IMPRESSION: 1. Disc and facet degeneration that is advanced at L4-5 and L5-S1. 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  He states he can walk as far as he wants without calf pain, he has cramping in the left foot and pins and needles at night.  He has bilateral palpable femoral and DP pulses.  We will schedule him to follow up in 1 year for repeat ABI's.  We encouraged him to stop smoking and continue maximum medical management with his statin, aspirin and DM management.       Theda Sers, EMMA MAUREEN PA-C [facility]  Patient was seen in conjunction with Dr. Oneida Alar today  History and exam findings as above. The patient describes that his left foot turns inward with almost a cramping type sensation at night time. This is not a usual symptom of peripheral arterial disease. This may be related more to his nerve root compression. He has palpable pulses in the dorsalis pedis area of both feet suggesting adequate flow. Although his ABIs were slightly depressed the presence of  his palpable pulses suggest that he certainly has adequate perfusion and does not really explain the left foot symptoms. He is able to angulate as far as he wishes without developing claudication. He has no rest pain. He has no nonhealing wounds. Greater than 3 minutes today were spent regarding smoking cessation counseling. I did review his ABIs and he does have evidence of peripheral arterial disease and I discussed with him today risk of limb loss long-term if he continues to smoke especially in combination with diabetes.  The patient will follow-up with [place] in one year for repeat ABIs and see our nurse practitioner. If he develops nonhealing wounds or claudication symptoms or feels his symptoms or change and he can follow-up sooner.  Juanda Crumble  Fields, MD [facility] Office: [phone] Pager: [phone]

## 2016-11-14 NOTE — Telephone Encounter (Signed)
Both the vascular surgeon and I suspect that his worsening left leg/foot cramps are due to his lumbar spine/discs compressing the nerve that supplies his foot. Did he see any improvement in his sxs after the DepoMedrol injection he was given at his last visit w/ me 10/4? No med is going to fix this - to get long-lasting symptom improvement, he is likely going to have to do physical therapy, and/or consider injections or other procedure with a specialists - ortho spine, pain management, neurology, or neurosurgery such as Dr. Hal Neer.  It is possible that we could find med that might temporarily decrease the sensation for a few hours - last yr we were going to try him on a muscle relaxant but he never got it filled due to insurance issues so lets see how he responds to zanaflex - usually causes sedaiton so most people can only take it once daily before bed.  If sxs persist, please make appt w/ me for further eval and discussion of med options such as below.  He is already on gabapentin 900 bid - we could try adding in another smaller dose of gabapentin mid-day or we could try changing the gabapentin to it's sister med lyrica which may be more effective/less side effects but usually a higher copay. He can take acetaminophen 1g twice a day.  We could discuss adding in cymbalta or amitriptyline for additional pain control at his next visit.

## 2016-11-15 NOTE — Telephone Encounter (Signed)
Left detailed message.   

## 2016-11-26 ENCOUNTER — Telehealth: Payer: Self-pay

## 2016-11-26 NOTE — Telephone Encounter (Signed)
Community resource referral received from [employer], LPN for patient to provide dental resources. Telephone outreach to patient who requested the resources be mailed to him. Also wanted to schedule appointment with Dr. Brigitte Pulse, so I assisted him with this as well.    Josepha Pigg, B.A.  Care Guide - [facility] [place]

## 2016-11-27 NOTE — Addendum Note (Signed)
Addended by: Lianne Cure A on: 11/27/2016 04:27 PM   Modules accepted: Orders

## 2016-12-04 ENCOUNTER — Encounter: Payer: Self-pay | Admitting: Physician Assistant

## 2016-12-04 ENCOUNTER — Ambulatory Visit: Payer: Medicare Other | Admitting: Physician Assistant

## 2016-12-04 ENCOUNTER — Other Ambulatory Visit: Payer: Self-pay

## 2016-12-04 VITALS — BP 148/78 | HR 77 | Temp 97.3°F | Resp 18 | Ht 66.0 in | Wt 137.8 lb

## 2016-12-04 DIAGNOSIS — R109 Unspecified abdominal pain: Secondary | ICD-10-CM | POA: Diagnosis not present

## 2016-12-04 LAB — BMP8+EGFR
BUN / CREAT RATIO: 13 (ref 10–24)
BUN: 10 mg/dL (ref 8–27)
CO2: 23 mmol/L (ref 20–29)
CREATININE: 0.8 mg/dL (ref 0.76–1.27)
Calcium: 10.3 mg/dL — ABNORMAL HIGH (ref 8.6–10.2)
Chloride: 100 mmol/L (ref 96–106)
GFR, EST AFRICAN AMERICAN: 105 mL/min/{1.73_m2} (ref >59)
GFR, EST NON AFRICAN AMERICAN: 91 mL/min/{1.73_m2} (ref >59)
GLUCOSE: 110 mg/dL — AB (ref 65–99)
Potassium: 4.1 mmol/L (ref 3.5–5.2)
SODIUM: 140 mmol/L (ref 134–144)

## 2016-12-04 LAB — POCT URINALYSIS DIP (MANUAL ENTRY)
Bilirubin, UA: NEGATIVE
Blood, UA: NEGATIVE
Glucose, UA: 100 mg/dL — AB
Ketones, POC UA: NEGATIVE mg/dL
LEUKOCYTES UA: NEGATIVE
Nitrite, UA: NEGATIVE
PROTEIN UA: NEGATIVE mg/dL
Spec Grav, UA: 1.02 (ref 1.010–1.025)
UROBILINOGEN UA: 0.2 U/dL
pH, UA: 5 (ref 5.0–8.0)

## 2016-12-04 MED ORDER — TIZANIDINE HCL 4 MG PO TABS: 4.0000 mg | ORAL_TABLET | Freq: Three times a day (TID) | ORAL | 0 refills | Status: DC | PRN

## 2016-12-04 MED FILL — tiZANidine HCL 4 MG TABS: 4 | 30 days supply | Qty: 90 | Fill #0

## 2016-12-04 NOTE — Patient Instructions (Addendum)
  Please take the tizanidine as prescribed.  I would like you to apply ice on the area three times per day for 15 minutes.  You can try heat if the ice is not working--at least that is my preference.  Please be mindful that this can make you sleepy, so do not operate heavy machinery with this medication.    IF you received an x-ray today, you will receive an invoice from [facility]. Please contact [facility] at [phone] with questions or concerns regarding your invoice.   IF you received labwork today, you will receive an invoice from [facility]. Please contact [facility] at [phone] with questions or concerns regarding your invoice.   Our billing staff will not be able to assist you with questions regarding bills from these companies.  You will be contacted with the lab results as soon as they are available. The fastest way to get your results is to activate your My Chart account. Instructions are located on the last page of this paperwork. If you have not heard from [place] regarding the results in 2 weeks, please contact this office.

## 2016-12-04 NOTE — Progress Notes (Signed)
PRIMARY CARE AT [place], [place] [address] [phone]  Date:  12/04/2016   Name:  Alexander [name]   DOB:  November 10, 1945   MRN:  [id number]  PCP:  Shawnee Knapp, MD    History of Present Illness:  Alexander [name] is a 71 y.o. male patient who presents to PCP with  Chief Complaint  Patient presents with  . Flank Pain    right side to lower back area x yesterday      Pain in the back and side of the right region, described as sharp and constant when he is moving it comes.  10/10.  He has no nausea associated, no black or bloody stool, no dysuria, hematuria, or frequency.  He recalls possibly twisting his torso when he was moving yesterday.    Patient Active Problem List   Diagnosis Date Noted  . Peripheral arterial occlusive disease ([place]) 10/28/2016  . Hyperlipidemia LDL goal <70 05/01/2015  . Tobacco use disorder 12/09/2013  . 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 Erectile dysfunction due to diseases classified elsewhere 12/09/2013  . Type 2 diabetes mellitus with other circulatory complications ([place]) 41/63/8453  . Left lumbar radiculopathy 09/30/2012  . Allergic rhinitis 04/08/2011  . Hypertension   . HTN (hypertension), benign 03/09/2011  . History of prostate cancer 03/09/2011    Past Medical History:  Diagnosis Date  . Cancer [facility])    prostate  . Diabetes mellitus without complication ([place])   . Hypertension   . Neuromuscular disorder ([place])   . Peripheral arterial disease ([place]) 11/14/2016   non-occlusive, asymptomatic, seen by vascular surgery Dr. Oneida Alar who rec repeated ABIs annually    Past Surgical History:  Procedure Laterality Date  . COLON SURGERY     "locked bowel" fixed  . HERNIA REPAIR    . LEFT HEART CATHETERIZATION WITH CORONARY ANGIOGRAM N/A 09/15/2013   Performed by Martinique, Peter M, MD at [facility] CATH [facility]  . PROSTATE SURGERY  2005    Social History   Tobacco Use   . Smoking status: Current Every Day Smoker    Packs/day: 0.00    Years: 51.00    Pack years: 0.00    Types: Cigarettes  . Smokeless tobacco: Never Used  Substance Use Topics  . Alcohol use: No  . Drug use: No    Family History  Problem Relation Age of Onset  . Diabetes Mother   . Depression Father   . Hyperlipidemia Sister     Allergies  Allergen Reactions  . Lisinopril Swelling    angioedema    Medication list has been reviewed and updated.  Current Outpatient Medications on File Prior to Visit  Medication Sig Dispense Refill  . amLODipine (NORVASC) 10 MG tablet Take 1 tablet (10 mg total) by mouth daily. 30 tablet 5  . aspirin EC 81 MG tablet Take 81 mg by mouth daily.    . Blood Glucose Monitoring Suppl W/DEVICE KIT 1 each by Does not apply route daily. 100 each 3  . fluticasone (FLONASE) 50 MCG/ACT nasal spray Place 2 sprays into both nostrils daily. 16 g 6  . gabapentin (NEURONTIN) 300 MG capsule Take 3 capsules (900 mg total) by mouth 2 (two) times daily. 540 capsule 1  . HYDROcodone-acetaminophen (NORCO) 10-325 MG tablet Take 1 tablet by mouth every 6 (six) hours as needed for moderate pain. 60 tablet 0  . metFORMIN (GLUCOPHAGE) 850 MG tablet TAKE  1 TABLET BY MOUTH TWO  TIMES DAILY WITH A MEAL 180 tablet 1  . montelukast (SINGULAIR) 10 MG tablet Take 1 tablet (10 mg total) by mouth at bedtime. 90 tablet 1  . [DISCONTINUED] lisinopril (PRINIVIL,ZESTRIL) 10 MG tablet TAKE ONE TABLET BY MOUTH ONCE DAILY 30 tablet 9   No current facility-administered medications on file prior to visit.     ROS ROS otherwise unremarkable unless listed above.  Physical Examination: BP (!) 148/78   Pulse 77   Temp (!) 97.3 F (36.3 C) (Oral)   Resp 18   Ht 5' 6"  (1.676 m)   Wt 137 lb 12.8 oz (62.5 kg)   SpO2 95%   BMI 22.24 kg/m  Ideal Body Weight: Weight in (lb) to have BMI = 25: 154.6  Physical Exam  Constitutional: He is oriented to person, place, and time. He appears  well-developed and well-nourished. No distress.  HENT:  Head: Normocephalic and atraumatic.  Eyes: Conjunctivae and EOM are normal. Pupils are equal, round, and reactive to light.  Cardiovascular: Normal rate.  Pulmonary/Chest: Effort normal. No respiratory distress.  Musculoskeletal:       Lumbar back: He exhibits normal range of motion, no tenderness, no bony tenderness and no spasm.  Right flank under the costal border, but not along the rib line, or crepitus involved.    No erythema.    Neurological: He is alert and oriented to person, place, and time.  Skin: Skin is warm and dry. He is not diaphoretic.  Psychiatric: He has a normal mood and affect. His behavior is normal.    Results for orders placed or performed in visit on 12/04/16  [facility]  Result Value Ref Range   Glucose 110 (H) 65 - 99 mg/dL   BUN 10 8 - 27 mg/dL   Creatinine, Ser 0.80 0.76 - 1.27 mg/dL   GFR calc non Af Amer 91 >59 mL/min/1.73   GFR calc Af Amer 105 >59 mL/min/1.73   BUN/Creatinine Ratio 13 10 - 24   Sodium 140 134 - 144 mmol/L   Potassium 4.1 3.5 - 5.2 mmol/L   Chloride 100 96 - 106 mmol/L   CO2 23 20 - 29 mmol/L   Calcium 10.3 (H) 8.6 - 10.2 mg/dL  POCT urinalysis dipstick  Result Value Ref Range   Color, UA yellow yellow   Clarity, UA clear clear   Glucose, UA =100 (A) negative mg/dL   Bilirubin, UA negative negative   Ketones, POC UA negative negative mg/dL   Spec Grav, UA 1.020 1.010 - 1.025   Blood, UA negative negative   pH, UA 5.0 5.0 - 8.0   Protein Ur, POC negative negative mg/dL   Urobilinogen, UA 0.2 0.2 or 1.0 E.U./dL   Nitrite, UA Negative Negative   Leukocytes, UA Negative Negative   Wt Readings from Last 3 Encounters:  12/05/16 137 lb (62.1 kg)  12/04/16 137 lb 12.8 oz (62.5 kg)  11/14/16 135 lb (61.2 kg)    Assessment and Plan: Alexander [name] is a 71 y.o. male who is here today for cc of  Chief Complaint  Patient presents with  . Flank Pain    right side to  lower back area x yesterday   --refilled the tizanidine. --appears more msk.  Advised icing, and alarming symptoms to warrant immediate return.  --rtc as needed. Flank pain - Plan: POCT urinalysis dipstick, BMP8+eGFR  Ivar Drape, PA-C [facility] 11/20/20189:21 AM

## 2016-12-05 ENCOUNTER — Emergency Department (HOSPITAL_COMMUNITY)
Admission: EM | Admit: 2016-12-05 | Discharge: 2016-12-05 | Disposition: A | Discharge status: Home / Self Care | Payer: Medicare Other | Attending: Emergency Medicine | Admitting: Emergency Medicine

## 2016-12-05 ENCOUNTER — Telehealth: Payer: Self-pay | Admitting: Family Medicine

## 2016-12-05 ENCOUNTER — Encounter (HOSPITAL_COMMUNITY): Payer: Self-pay | Admitting: Emergency Medicine

## 2016-12-05 DIAGNOSIS — M545 Low back pain, unspecified: Secondary | ICD-10-CM

## 2016-12-05 DIAGNOSIS — Z79899 Other long term (current) drug therapy: Secondary | ICD-10-CM | POA: Diagnosis not present

## 2016-12-05 DIAGNOSIS — F17210 Nicotine dependence, cigarettes, uncomplicated: Secondary | ICD-10-CM | POA: Insufficient documentation

## 2016-12-05 DIAGNOSIS — E119 Type 2 diabetes mellitus without complications: Secondary | ICD-10-CM | POA: Insufficient documentation

## 2016-12-05 DIAGNOSIS — I10 Essential (primary) hypertension: Secondary | ICD-10-CM | POA: Diagnosis not present

## 2016-12-05 DIAGNOSIS — Z7982 Long term (current) use of aspirin: Secondary | ICD-10-CM | POA: Diagnosis not present

## 2016-12-05 DIAGNOSIS — I2510 Atherosclerotic heart disease of native coronary artery without angina pectoris: Secondary | ICD-10-CM | POA: Diagnosis not present

## 2016-12-05 DIAGNOSIS — Z8546 Personal history of malignant neoplasm of prostate: Secondary | ICD-10-CM | POA: Insufficient documentation

## 2016-12-05 NOTE — Telephone Encounter (Signed)
Copied from [place] [phone]. Topic: Quick Communication - See Telephone Encounter >> Dec 05, 2016  8:12 AM Robina Ade, Helene Kelp D wrote: CRM for notification. See Telephone encounter for: 12/05/16. Patient needs to talk to [facility], Dr. Brigitte Pulse nurse regarding his visit. Please call patient back, thanks.

## 2016-12-05 NOTE — Telephone Encounter (Signed)
Spoke with pt.  He wanted provider to know he was going to [facility] ED this am.   States pain in Right side is worse.  Pain at R/waist and above and radiates to the back. LBM this am,  No issues with urination. Advised to call if we can help.

## 2016-12-05 NOTE — ED Notes (Signed)
ED Provider at bedside. 

## 2016-12-05 NOTE — Discharge Instructions (Signed)
You may use over-the-counter Motrin (Ibuprofen), Acetaminophen (Tylenol), topical muscle creams such as SalonPas, Icy Hot, Bengay, etc. Please stretch, apply heat, and have massage therapy for additional assistance. ° °

## 2016-12-05 NOTE — ED Provider Notes (Signed)
[facility] DEPT Provider Note  CSN: [id number] Arrival date & time: 12/05/16 1126  Chief Complaint(s) Back Pain  HPI Alexander [name] is a 72 y.o. male with a history of prostate cancer status post prostatectomy 8 years ago in remission, hypertension, diabetes, degenerative disc disease who presents to the emergency department with 2 days of right lower back pain described as a dull achiness.  Pain is been constant since onset and has gradually improved over these past 2 days. States that pain goes from back, around to the front. Pain is improved with certain positions and exacerbated with twisting, bending.  He denies any fevers, chills, nausea, vomiting, diarrhea, abdominal pain, chest pain, shortness of breath, urinary symptoms.  He denies any bladder/bowel incontinence.  No lower extremity weakness or loss of sensation.  Patient was seen by his primary care provider yesterday who obtained a BMP and a urinalysis which were grossly unremarkable.  Currently, patient states that his pain has resolved since being in the ED.  HPI  Past Medical History Past Medical History:  Diagnosis Date  . Cancer [facility])    prostate  . Diabetes mellitus without complication ([place])   . Hypertension   . Neuromuscular disorder ([place])   . Peripheral arterial disease ([place]) 11/14/2016   non-occlusive, asymptomatic, seen by vascular surgery Dr. Oneida Alar who rec repeated ABIs annually   Patient Active Problem List   Diagnosis Date Noted  . Peripheral arterial occlusive disease ([place]) 10/28/2016  . Hyperlipidemia LDL goal <70 05/01/2015  . Tobacco use disorder 12/09/2013  . 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 Erectile dysfunction due to diseases classified elsewhere 12/09/2013  . Type 2 diabetes mellitus with other circulatory complications ([place]) 38/18/2993  . Left lumbar radiculopathy 09/30/2012  .  Allergic rhinitis 04/08/2011  . Hypertension   . HTN (hypertension), benign 03/09/2011  . History of prostate cancer 03/09/2011   Home Medication(s) Prior to Admission medications   Medication Sig Start Date End Date Taking? Authorizing Provider  amLODipine (NORVASC) 10 MG tablet Take 1 tablet (10 mg total) by mouth daily. 10/14/16  Yes Rutherford Guys, MD  aspirin EC 81 MG tablet Take 81 mg by mouth daily.   Yes [provider]  fluticasone (FLONASE) 50 MCG/ACT nasal spray Place 2 sprays into both nostrils daily. 10/14/16  Yes Rutherford Guys, MD  gabapentin (NEURONTIN) 300 MG capsule Take 3 capsules (900 mg total) by mouth 2 (two) times daily. 10/24/16  Yes Shawnee Knapp, MD  HYDROcodone-acetaminophen [facility]) 10-325 MG tablet Take 1 tablet by mouth every 6 (six) hours as needed for moderate pain. 10/24/16  Yes Shawnee Knapp, MD  metFORMIN (GLUCOPHAGE) 850 MG tablet TAKE 1 TABLET BY MOUTH TWO  TIMES DAILY WITH A MEAL 10/24/16  Yes Shawnee Knapp, MD  montelukast (SINGULAIR) 10 MG tablet Take 1 tablet (10 mg total) by mouth at bedtime. 10/24/16  Yes Shawnee Knapp, MD  tiZANidine (ZANAFLEX) 4 MG tablet Take 1 tablet (4 mg total) every 8 (eight) hours as needed by mouth for muscle spasms. In legs/feet. likely cause sedation so start qhs only 12/04/16  Yes English, [place] D, PA  vitamin C (ASCORBIC ACID) 500 MG tablet Take 500 mg daily by mouth.   Yes [provider]  Blood Glucose Monitoring Suppl W/DEVICE KIT 1 each by Does not apply route daily. 12/09/13   Shawnee Knapp, MD  Past Surgical History Past Surgical History:  Procedure Laterality Date  . COLON SURGERY     "locked bowel" fixed  . HERNIA REPAIR    . LEFT HEART CATHETERIZATION WITH CORONARY ANGIOGRAM N/A 09/15/2013   Procedure: LEFT HEART CATHETERIZATION WITH CORONARY ANGIOGRAM;  Surgeon: Peter M  Martinique, MD;  Location: [facility] CATH LAB;  Service: Cardiovascular;  Laterality: N/A;  . PROSTATE SURGERY  2005   Family History Family History  Problem Relation Age of Onset  . Diabetes Mother   . Depression Father   . Hyperlipidemia Sister     Social History Social History   Tobacco Use  . Smoking status: Current Every Day Smoker    Packs/day: 0.00    Years: 51.00    Pack years: 0.00    Types: Cigarettes  . Smokeless tobacco: Never Used  Substance Use Topics  . Alcohol use: No  . Drug use: No   Allergies Lisinopril  Review of Systems Review of Systems All other systems are reviewed and are negative for acute change except as noted in the HPI  Physical Exam Vital Signs  I have reviewed the triage vital signs BP (!) 176/91 (BP Location: Left Arm)   Pulse 84   Temp 97.8 F (36.6 C) (Oral)   Resp 18   Ht _0  (1.676 m)   Wt 62.1 kg (137 lb)   SpO2 97%   BMI 22.11 kg/m   Physical Exam  Constitutional: He is oriented to person, place, and time. He appears well-developed and well-nourished. No distress.  HENT:  Head: Normocephalic and atraumatic.  Nose: Nose normal.  Eyes: Conjunctivae and EOM are normal. Pupils are equal, round, and reactive to light. Right eye exhibits no discharge. Left eye exhibits no discharge. No scleral icterus.  Neck: Normal range of motion. Neck supple.  Cardiovascular: Normal rate and regular rhythm. Exam reveals no gallop and no friction rub.  No murmur heard. Pulmonary/Chest: Effort normal and breath sounds normal. No stridor. No respiratory distress. He has no rales.  Abdominal: Soft. He exhibits no distension. There is no tenderness.  Musculoskeletal: He exhibits no edema or tenderness.       Lumbar back: He exhibits no tenderness and no bony tenderness.  Neurological: He is alert and oriented to person, place, and time.  Spine Exam: Strength: 5/5 throughout LE bilaterally (hip flexion/extension, adduction/abduction; knee  flexion/extension; foot dorsiflexion/plantarflexion, inversion/eversion; great toe inversion) Sensation: Intact to light touch in proximal and distal LE bilaterally Reflexes: 1+ quadriceps and achilles reflexes   Skin: Skin is warm and dry. No rash noted. He is not diaphoretic. No erythema.  Psychiatric: He has a normal mood and affect.  Vitals reviewed.   ED Results and Treatments Labs (all labs ordered are listed, but only abnormal results are displayed) Labs Reviewed - No data to display  EKG  EKG Interpretation  Date/Time:    Ventricular Rate:    PR Interval:    QRS Duration:   QT Interval:    QTC Calculation:   R Axis:     Text Interpretation:        Radiology No results found. Pertinent labs & imaging results that were available during my care of the patient were reviewed by me and considered in my medical decision making (see chart for details).  Medications Ordered in ED Medications - No data to display                                                                                                                                  Procedures Procedures EMERGENCY DEPARTMENT ULTRASOUND  Study: Limited Retroperitoneal Ultrasound of the Abdominal Aorta.  INDICATIONS:Back pain, Age>55 and smoker Multiple views of the abdominal aorta were obtained in real-time from the diaphragmatic hiatus to the aortic bifurcation in transverse planes with a multi-frequency probe.  PERFORMED BY: Myself IMAGES ARCHIVED?: Yes LIMITATIONS:  Bowel gas INTERPRETATION:  No abdominal aortic aneurysm  EMERGENCY DEPARTMENT US RENAL EXAM  "Study: Limited Retroperitoneal Ultrasound of Kidneys"  INDICATIONS: Flank pain and Back pain Long and short axis of both kidneys were obtained.   PERFORMED BY: Myself IMAGES ARCHIVED?: Yes LIMITATIONS: None VIEWS USED: Long axis and  Short axis  INTERPRETATION: No Hydronephrosis, Right Renal cyst, No Kidney stone  EMERGENCY DEPARTMENT ULTRASOUND  Study: Limited Ultrasound of Bladder  INDICATIONS: to assess for urinary retention and/or bladder volume Multiple views of the bladder were obtained in real-time in the transverse and longitudinal planes with a multi-frequency probe.  PERFORMED BY: Myself IMAGES ARCHIVED?: Yes LIMITATIONS:  none INTERPRETATION: Volume Measurement 170cc  (including critical care time)  Medical Decision Making / ED Course I have reviewed the nursing notes for this encounter and the patient's prior records (if available in EHR or on provided paperwork).     On review of record, patient had a lumbar x-ray last month which did not reveal any evidence of bony metastases.  It did note degenerative disc disease and facet degenerative disease.  71 y.o. male presents with back pain in lumbar area for 2 days without signs of radicular pain. No acute traumatic onset. No red flag symptoms of fever, weight loss, saddle anesthesia, weakness, fecal/urinary incontinence or urinary retention.   Bedside ultrasound without evidence of AAA, hydronephrosis, renal stones.  Patient was able to void in the ED providing 200 cc of urine.  Postvoid residual within normal limits.  Suspect MSK etiology. No indication for imaging emergently. Patient was recommended to take short course of scheduled NSAIDs and engage in early mobility as definitive treatment. Return precautions discussed for worsening or new concerning symptoms.    Final Clinical Impression(s) / ED Diagnoses Final diagnoses:  Acute right-sided low back pain without sciatica    Disposition: Discharge  Condition: Good  I have discussed the results,  Dx and Tx plan with the patient who expressed understanding and agree(s) with the plan. Discharge instructions discussed at great length. The patient was given strict return precautions who verbalized  understanding of the instructions. No further questions at time of discharge.    ED Discharge Orders    None       Follow Up: Shawnee Knapp, MD [place] [address] [phone]  Schedule an appointment as soon as possible for a visit  in 5-7 days, If symptoms do not improve or  worsen     This chart was dictated using voice recognition software.  Despite best efforts to proofread,  errors can occur which can change the documentation meaning.   Fatima Blank, MD 12/05/16 1400

## 2016-12-05 NOTE — ED Triage Notes (Signed)
Patient reports that he has bad disc in his back and pain going sciatic nerve.  Patient c/o right flank/lower back pain for couple days when he moves. Denies any injuries to cause pain. Went to [place] yesterday and had lab work done but hasnt been made aware of results.  Denies any urinary problems.

## 2016-12-05 NOTE — Telephone Encounter (Signed)
Noted. Thanks.

## 2016-12-07 ENCOUNTER — Ambulatory Visit: Payer: Medicare Other | Admitting: Family Medicine

## 2016-12-11 ENCOUNTER — Ambulatory Visit: Payer: Medicare Other | Admitting: Family Medicine

## 2016-12-11 ENCOUNTER — Other Ambulatory Visit: Payer: Self-pay

## 2016-12-11 ENCOUNTER — Encounter: Payer: Self-pay | Admitting: Family Medicine

## 2016-12-11 VITALS — BP 118/82 | HR 82 | Temp 98.6°F | Resp 16 | Ht 66.0 in | Wt 133.8 lb

## 2016-12-11 DIAGNOSIS — M545 Low back pain, unspecified: Secondary | ICD-10-CM

## 2016-12-11 MED ORDER — MELOXICAM 7.5 MG PO TABS: 7.5000 mg | ORAL_TABLET | Freq: Every day | ORAL | 1 refills | Status: DC

## 2016-12-11 NOTE — Patient Instructions (Addendum)
Increase your gabapentin 300mg  to 1 tab in the morning, 1 tab with lunch, and 2 tabs before bed. Try taking 1/2 to 1 tab of the zanaflex first thing in the morning right when you wake up since that's when your pain is the worst and another 1/2 to 1 tab at night before bed so you are not as stiff when you wake up in the morning. Start a meloxicam 7.5mg  every morning.  You can use over-the-counter tylenol/acetaminophen arthritis whenever needed. OK to take a hydrocodone as needed - you can let me know when you need a refill.  Continue heat. I do think physical therapy would help a lot - let me know if I can place a referral for you.    IF you received an x-ray today, you will receive an invoice from [facility]. Please contact [facility] at [phone] with questions or concerns regarding your invoice.   IF you received labwork today, you will receive an invoice from [facility]. Please contact [facility] at [phone] with questions or concerns regarding your invoice.   Our billing staff will not be able to assist you with questions regarding bills from these companies.  You will be contacted with the lab results as soon as they are available. The fastest way to get your results is to activate your My Chart account. Instructions are located on the last page of this paperwork. If you have not heard from [place] regarding the results in 2 weeks, please contact this office.      Low Back Strain A strain is a stretch or tear in a muscle or the strong cords of tissue that attach muscle to bone (tendons). Strains of the lower back (lumbar spine) are a common cause of low back pain. A strain occurs when muscles or tendons are torn or are stretched beyond their limits. The muscles may become inflamed, resulting in pain and sudden muscle tightening (spasms). A strain can happen suddenly due to an injury (trauma), or it can develop gradually due to overuse. There are three types of  strains:  Grade 1 is a mild strain involving a minor tear of the muscle fibers or tendons. This may cause some pain but no loss of muscle strength.  Grade 2 is a moderate strain involving a partial tear of the muscle fibers or tendons. This causes more severe pain and some loss of muscle strength.  Grade 3 is a severe strain involving a complete tear of the muscle or tendon. This causes severe pain and complete or nearly complete loss of muscle strength.  What are the causes? This condition may be caused by:  Trauma, such as a fall or a hit to the body.  Twisting or overstretching the back. This may result from doing activities that require a lot of energy, such as lifting heavy objects.  What increases the risk? The following factors may increase your risk of getting this condition:  Playing contact sports.  Participating in sports or activities that put excessive stress on the back and require a lot of bending and twisting, including: ? Lifting weights or heavy objects. ? Gymnastics. ? Soccer. ? Figure skating. ? Snowboarding.  Being overweight or obese.  Having poor strength and flexibility.  What are the signs or symptoms? Symptoms of this condition may include:  Sharp or dull pain in the lower back that does not go away. Pain may extend to the buttocks.  Stiffness.  Limited range of motion.  Inability to stand up straight  due to stiffness or pain.  Muscle spasms.  How is this diagnosed? This condition may be diagnosed based on:  Your symptoms.  Your medical history.  A physical exam. ? Your health care provider may push on certain areas of your back to determine the source of your pain. ? You may be asked to bend forward, backward, and side to side to assess the severity of your pain and your range of motion.  Imaging tests, such as: ? X-rays. ? MRI.  How is this treated? Treatment for this condition may include:  Applying heat and cold to the  affected area.  Medicines to help relieve pain and to relax your muscles (muscle relaxants).  NSAIDs to help reduce swelling and discomfort.  Physical therapy.  When your symptoms improve, it is important to gradually return to your normal routine as soon as possible to reduce pain, avoid stiffness, and avoid loss of muscle strength. Generally, symptoms should improve within 6 weeks of treatment. However, recovery time varies. Follow these instructions at home: Managing pain, stiffness, and swelling  If directed, apply ice to the injured area during the first 24 hours after your injury. ? Put ice in a plastic bag. ? Place a towel between your skin and the bag. ? Leave the ice on for 20 minutes, 2-3 times a day.  If directed, apply heat to the affected area as often as told by your health care provider. Use the heat source that your health care provider recommends, such as a moist heat pack or a heating pad. ? Place a towel between your skin and the heat source. ? Leave the heat on for 20-30 minutes. ? Remove the heat if your skin turns bright red. This is especially important if you are unable to feel pain, heat, or cold. You may have a greater risk of getting burned. Activity  Rest and return to your normal activities as told by your health care provider. Ask your health care provider what activities are safe for you.  Avoid activities that take a lot of effort (are strenuous) for as long as told by your health care provider.  Do exercises as told by your health care provider. General instructions   Take over-the-counter and prescription medicines only as told by your health care provider.  If you have questions or concerns about safety while taking pain medicine, talk with your health care provider.  Do not drive or operate heavy machinery until you know how your pain medicine affects you.  Do not use any tobacco products, such as cigarettes, chewing tobacco, and e-cigarettes.  Tobacco can delay bone healing. If you need help quitting, ask your health care provider.  Keep all follow-up visits as told by your health care provider. This is important. How is this prevented?  Warm up and stretch before being active.  Cool down and stretch after being active.  Give your body time to rest between periods of activity.  Avoid: ? Being physically inactive for long periods at a time. ? Exercising or playing sports when you are tired or in pain.  Use correct form when playing sports and lifting heavy objects.  Use good posture when sitting and standing.  Maintain a healthy weight.  Sleep on a mattress with medium firmness to support your back.  Make sure to use equipment that fits you, including shoes that fit well.  Be safe and responsible while being active to avoid falls.  Do at least 150 minutes of moderate-intensity exercise  each week, such as brisk walking or water aerobics. Try a form of exercise that takes stress off your back, such as swimming or stationary cycling.  Maintain physical fitness, including: ? Strength. ? Flexibility. ? Cardiovascular fitness. ? Endurance. Contact a health care provider if:  Your back pain does not improve after 6 weeks of treatment.  Your symptoms get worse. Get help right away if:  Your back pain is severe.  You are unable to stand or walk.  You develop pain in your legs.  You develop weakness in your buttocks or legs.  You have difficulty controlling when you urinate or when you have a bowel movement. This information is not intended to replace advice given to you by your health care provider. Make sure you discuss any questions you have with your health care provider. Document Released: 01/07/2005 Document Revised: 09/14/2015 Document Reviewed: 10/19/2014 Elsevier Interactive Patient Education  2017 [employer].

## 2016-12-11 NOTE — Progress Notes (Signed)
Subjective:  By signing my name below, I, Alexander [name], attest that this documentation has been prepared under the direction and in the presence of [name], MD Electronically Signed: Ladene Artist, ED Scribe 12/11/2016 at 10:20 AM.   Patient ID: Alexander [name], male    DOB: 1945/04/02, 71 y.o.   MRN: [id number]  Chief Complaint  Patient presents with  . Follow-up    TIIDM   HPI Alexander [name] is a 71 y.o. male who presents to Primary Care at [facility] for follow-up on DM.  DMII: Diagnosed .   Lab Results  Component Value Date   HGBA1C 6.9 10/24/2016   HGBA1C 7.0 08/17/2015   HGBA1C 7.3 04/20/2015   CBGs: fasting a.m. ; after meal  ; No hypoglycemic episodes. Not checking blood sugars at home.  Meter type: pt is unsure Diet:  Exercising:  DM Med Regimen: Prior changes: 10/4 increased metformin from 500 bid to 850 bid as A1C increased to 6.9. Denies nausea, vomiting.   eGFR:  Baseline Cr:  Last checked . Microalb: Done . Normal. On acei Lipids:  LDL ,  non-HDL .  Last levels done  -did not tolerate pravastatin 40 due to LE and foot cramps Taking asa 81 qd.  Optho: Seen annually by - last exam  Feet: Monofilament exam done . Denies any no problems.  Not seen by podiatry prior.  Immunizations:  Influenza:  Pneumovax-23:  Chronic L LE Pain Was planning for L4-5, L5-S1 decompressions with Dr. Karie Chimera around 2015 but required cardiac cath prior to surgical clearance after which pt decided surgery was too risky and symptoms were tolerable with medical management. Responded well to steroid prior so treated with depo medrol 80 IM x 1 at last visit. On gabapentin 900 mg tid with rare PRN hydrocodone 10. Rx Flexeril prior but insurance wouldn't coverage. Pt states he has been taking 900 mg bid but is considering increasing to tid. Denies drowsiness with gabapentin.   Tobacco Use Less than 1 ppd.   HPL goal <70:  Started on pravastatin 40 3 mos prior as LDL 97->104->113  but it caused his feet to cramp so he stopped taking it. CAD: No prior MI but pt did have abnml stress  HTN: On amlodipine 61m qd. (had acei angioedema). Last BP was above goal.  Not checking BP outside office.  No lower ext edema.  Back Pain Pt also presents with non-radiating R lumbar spine back pain x 1 week. Pt denies fall or specific injury. [facility]. Pt states that pain is worse in the morning but eases as the day progresses. He has been taking Zanaflex with some relief and without drowsiness. Has also tried heat, occasional BC arthritis, hydrocodone 2 nights and another last night since onset of back pain. Denies changes in urine/bowels, fever, chills, nausea, vomiting, LE weakness.   Past Medical History:  Diagnosis Date  . Cancer ([facility]    prostate  . Diabetes mellitus without complication ([facility]   . Hypertension   . Neuromuscular disorder ([facility]   . Peripheral arterial disease ([facility] 11/14/2016   non-occlusive, asymptomatic, seen by vascular surgery Dr. FOneida Alarwho rec repeated ABIs annually   Current Outpatient Medications on File Prior to Visit  Medication Sig Dispense Refill  . amLODipine (NORVASC) 10 MG tablet Take 1 tablet (10 mg total) by mouth daily. 30 tablet 5  . aspirin EC 81 MG tablet Take 81 mg by  mouth daily.    . Blood Glucose Monitoring Suppl W/DEVICE KIT 1 each by Does not apply route daily. 100 each 3  . fluticasone (FLONASE) 50 MCG/ACT nasal spray Place 2 sprays into both nostrils daily. 16 g 6  . gabapentin (NEURONTIN) 300 MG capsule Take 3 capsules (900 mg total) by mouth 2 (two) times daily. 540 capsule 1  . HYDROcodone-acetaminophen (NORCO) 10-325 MG tablet Take 1 tablet by mouth every 6 (six) hours as needed for moderate pain. 60 tablet 0  . metFORMIN (GLUCOPHAGE) 850 MG tablet TAKE 1 TABLET BY MOUTH TWO  TIMES DAILY WITH A MEAL 180 tablet 1  . montelukast (SINGULAIR) 10 MG tablet Take 1 tablet (10 mg total) by mouth at  bedtime. 90 tablet 1  . tiZANidine (ZANAFLEX) 4 MG tablet Take 1 tablet (4 mg total) every 8 (eight) hours as needed by mouth for muscle spasms. In legs/feet. likely cause sedation so start qhs only 90 tablet 0  . vitamin C (ASCORBIC ACID) 500 MG tablet Take 500 mg daily by mouth.    . [DISCONTINUED] lisinopril (PRINIVIL,ZESTRIL) 10 MG tablet TAKE ONE TABLET BY MOUTH ONCE DAILY 30 tablet 9   No current facility-administered medications on file prior to visit.    Allergies  Allergen Reactions  . Lisinopril Swelling    angioedema   Past Surgical History:  Procedure Laterality Date  . COLON SURGERY     "locked bowel" fixed  . HERNIA REPAIR    . LEFT HEART CATHETERIZATION WITH CORONARY ANGIOGRAM N/A 09/15/2013   Procedure: LEFT HEART CATHETERIZATION WITH CORONARY ANGIOGRAM;  Surgeon: Peter M Martinique, MD;  Location: [facility] CATH LAB;  Service: Cardiovascular;  Laterality: N/A;  . PROSTATE SURGERY  2005   Family History  Problem Relation Age of Onset  . Diabetes Mother   . Depression Father   . Hyperlipidemia Sister    Social History   Socioeconomic History  . Marital status: Widowed    Spouse name: None  . Number of children: 2  . Years of education: 9  . Highest education level: None  Social Needs  . Financial resource strain: None  . Food insecurity - worry: None  . Food insecurity - inability: None  . Transportation needs - medical: None  . Transportation needs - non-medical: None  Occupational History  . Occupation: retired  Tobacco Use  . Smoking status: Current Every Day Smoker    Packs/day: 0.00    Years: 51.00    Pack years: 0.00    Types: Cigarettes  . Smokeless tobacco: Never Used  Substance and Sexual Activity  . Alcohol use: No  . Drug use: No  . Sexual activity: Yes  Other Topics Concern  . None  Social History Narrative   Patient is single and his daughter lives with him.   Patient has two children.   Patient has a 9th grade education.   Patient works in  [employer] (part-time).   Patient drinks one to two cups of coffee daily.   Patient is right-handed.         Depression screen [facility] 2/9 12/11/2016 12/04/2016 10/28/2016 10/24/2016 10/14/2016  Decreased Interest 0 0 0 0 0  Down, Depressed, Hopeless 0 0 0 0 0  PHQ - 2 Score 0 0 0 0 0    Review of Systems  Constitutional: Negative for chills and fever.  Gastrointestinal: Negative for constipation, nausea and vomiting.  Genitourinary: Negative for difficulty urinating.  Musculoskeletal: Positive for back pain.  Neurological: Negative for  weakness.      Objective:   Physical Exam  Constitutional: He is oriented to person, place, and time. He appears well-developed and well-nourished. No distress.  HENT:  Head: Normocephalic and atraumatic.  Eyes: Conjunctivae and EOM are normal.  Neck: Neck supple. No tracheal deviation present.  Cardiovascular: Normal rate, regular rhythm and normal heart sounds.  Trace pedal pulses palpable   Pulmonary/Chest: Effort normal and breath sounds normal. No respiratory distress.  Abdominal: There is no CVA tenderness.  Musculoskeletal: Normal range of motion.  No tenderness to palpation over lumbar spine. No tenderness to palpation over lumbar paraspinal muscles. No SI joint pain. No trochanteric bursa pain. No CVA tenderness. No LE edema. Negative seated straight leg raise. 5/5 plantar flexion and dorsiflexion on L. 4+/5 dorsiflexion on R. 5/5 hip flexors, quads and hamstrings. Flexion greater than 90 degrees. No extension. Bilateral lateral rotation severely restricted.  Neurological: He is alert and oriented to person, place, and time.  Reflex Scores:      Patellar reflexes are 2+ on the right side and 2+ on the left side.      Achilles reflexes are 2+ on the right side and 2+ on the left side. Skin: Skin is warm and dry.  Psychiatric: He has a normal mood and affect. His behavior is normal.  Nursing note and vitals reviewed.  BP 118/82   Pulse 82    Temp 98.6 F (37 C)   Resp 16   Ht 5' 6"  (1.676 m)   Wt 133 lb 12.8 oz (60.7 kg)   SpO2 95%   BMI 21.60 kg/m     Assessment & Plan:  Okay to refill hydrocodone once prior to next visit if needed.  CPE scheduled with me on 2/4.   1. Acute right-sided low back pain without sciatica   Rec PT but pt wants to hold off for now as improving.  Start nsaid. Cont muscle relaxant. Has prn hydrocodone at home.  Meds ordered this encounter  Medications  . meloxicam (MOBIC) 7.5 MG tablet    Sig: Take 1 tablet (7.5 mg total) by mouth daily.    Dispense:  30 tablet    Refill:  1    I personally performed the services described in this documentation, which was scribed in my presence. The recorded information has been reviewed and considered, and addended by me as needed.   [name], M.D.  Primary Care at [facility] [address] [place], [place] [address] [phone] phone [phone] fax  12/14/16 1:13 AM

## 2017-01-17 ENCOUNTER — Other Ambulatory Visit: Payer: Self-pay

## 2017-01-17 ENCOUNTER — Telehealth: Payer: Self-pay | Admitting: Family Medicine

## 2017-01-17 MED ORDER — AMLODIPINE BESYLATE 10 MG PO TABS: 10.0000 mg | ORAL_TABLET | Freq: Every day | ORAL | 5 refills | Status: DC

## 2017-01-17 NOTE — Telephone Encounter (Signed)
Copied from [place]. Topic: Quick Communication - See Telephone Encounter >> Jan 17, 2017  1:46 PM Hewitt Shorts wrote: CRM for notification. See Telephone encounter for: pt daughter is calling to request that the medication tizanitine hcl 4mg  and amlodipine 10 mg be sent to [facility]   Best number [phone]  01/17/17.

## 2017-01-17 NOTE — Telephone Encounter (Signed)
Zanaflex needs provider approval. Chart up to date. Thanks.

## 2017-01-21 HISTORY — PX: COLONOSCOPY: SHX174

## 2017-01-21 MED ORDER — AMLODIPINE BESYLATE 10 MG PO TABS: 10.0000 mg | ORAL_TABLET | Freq: Every day | ORAL | 1 refills | Status: DC

## 2017-01-21 MED ORDER — TIZANIDINE HCL 4 MG PO TABS: 4.0000 mg | ORAL_TABLET | Freq: Three times a day (TID) | ORAL | 0 refills | Status: DC | PRN

## 2017-01-21 NOTE — Telephone Encounter (Signed)
done

## 2017-01-31 ENCOUNTER — Telehealth: Payer: Self-pay | Admitting: Family Medicine

## 2017-01-31 DIAGNOSIS — G8929 Other chronic pain: Secondary | ICD-10-CM

## 2017-01-31 DIAGNOSIS — M545 Low back pain: Principal | ICD-10-CM

## 2017-01-31 NOTE — Telephone Encounter (Signed)
Refill request for Norco and Mobic.

## 2017-01-31 NOTE — Telephone Encounter (Signed)
Copied from [place] [id number]. Topic: Quick Communication - Rx Refill/Question >> Jan 31, 2017 12:43 PM Patrice Paradise wrote: Medication:  HYDROcodone-acetaminophen (NORCO) 10-325 MG tablet  meloxicam (MOBIC) 7.5 MG tablet   Has the patient contacted their pharmacy? Yes.    (Agent: If no, request that the patient contact the pharmacy for the refill.)  Preferred Pharmacy (with phone number or street name):   [place] [place]. [address] [place] [address] Phone: [phone] Fax: [phone]     Agent: Please be advised that RX refills may take up to 3 business days. We ask that you follow-up with your pharmacy.

## 2017-02-03 MED ORDER — HYDROCODONE-ACETAMINOPHEN 10-325 MG PO TABS: 1.0000 | ORAL_TABLET | Freq: Four times a day (QID) | ORAL | 0 refills | Status: DC | PRN

## 2017-02-03 MED ORDER — MELOXICAM 7.5 MG PO TABS: 7.5000 mg | ORAL_TABLET | Freq: Every day | ORAL | 1 refills | Status: DC

## 2017-02-03 MED FILL — MELOXICAM 7.5 MG TABLET: 7.5 | 30 days supply | Qty: 30 | Fill #0

## 2017-02-03 MED FILL — HYDROCODON-APAP 10-325: 10-325 | 15 days supply | Qty: 60 | Fill #0

## 2017-02-03 NOTE — Addendum Note (Signed)
Addended by: Shawnee Knapp on: 02/03/2017 01:18 PM   Modules accepted: Orders

## 2017-02-03 NOTE — Telephone Encounter (Signed)
Sent to pharmacy. needs OV for any additional refills

## 2017-02-24 ENCOUNTER — Encounter: Payer: Medicare Other | Admitting: Family Medicine

## 2017-03-10 ENCOUNTER — Encounter: Payer: Self-pay | Admitting: Family Medicine

## 2017-03-10 ENCOUNTER — Ambulatory Visit (INDEPENDENT_AMBULATORY_CARE_PROVIDER_SITE_OTHER): Payer: Medicare Other | Admitting: Family Medicine

## 2017-03-10 VITALS — BP 148/84 | HR 68 | Temp 98.5°F | Resp 16 | Ht 66.0 in | Wt 132.8 lb

## 2017-03-10 DIAGNOSIS — Z1389 Encounter for screening for other disorder: Secondary | ICD-10-CM

## 2017-03-10 DIAGNOSIS — L249 Irritant contact dermatitis, unspecified cause: Secondary | ICD-10-CM | POA: Diagnosis not present

## 2017-03-10 DIAGNOSIS — I10 Essential (primary) hypertension: Secondary | ICD-10-CM | POA: Diagnosis not present

## 2017-03-10 DIAGNOSIS — I2510 Atherosclerotic heart disease of native coronary artery without angina pectoris: Secondary | ICD-10-CM | POA: Diagnosis not present

## 2017-03-10 DIAGNOSIS — Z1211 Encounter for screening for malignant neoplasm of colon: Secondary | ICD-10-CM | POA: Diagnosis not present

## 2017-03-10 DIAGNOSIS — E785 Hyperlipidemia, unspecified: Secondary | ICD-10-CM

## 2017-03-10 DIAGNOSIS — E1159 Type 2 diabetes mellitus with other circulatory complications: Secondary | ICD-10-CM | POA: Diagnosis not present

## 2017-03-10 DIAGNOSIS — Z1383 Encounter for screening for respiratory disorder NEC: Secondary | ICD-10-CM

## 2017-03-10 DIAGNOSIS — Z5181 Encounter for therapeutic drug level monitoring: Secondary | ICD-10-CM

## 2017-03-10 DIAGNOSIS — Z136 Encounter for screening for cardiovascular disorders: Secondary | ICD-10-CM | POA: Diagnosis not present

## 2017-03-10 DIAGNOSIS — Z125 Encounter for screening for malignant neoplasm of prostate: Secondary | ICD-10-CM

## 2017-03-10 DIAGNOSIS — I779 Disorder of arteries and arterioles, unspecified: Secondary | ICD-10-CM

## 2017-03-10 DIAGNOSIS — Z8546 Personal history of malignant neoplasm of prostate: Secondary | ICD-10-CM

## 2017-03-10 DIAGNOSIS — Z0000 Encounter for general adult medical examination without abnormal findings: Secondary | ICD-10-CM

## 2017-03-10 DIAGNOSIS — F17200 Nicotine dependence, unspecified, uncomplicated: Secondary | ICD-10-CM

## 2017-03-10 DIAGNOSIS — D126 Benign neoplasm of colon, unspecified: Secondary | ICD-10-CM | POA: Diagnosis not present

## 2017-03-10 DIAGNOSIS — M5416 Radiculopathy, lumbar region: Secondary | ICD-10-CM | POA: Diagnosis not present

## 2017-03-10 DIAGNOSIS — Z1212 Encounter for screening for malignant neoplasm of rectum: Secondary | ICD-10-CM

## 2017-03-10 LAB — POCT URINALYSIS DIP (MANUAL ENTRY)
Bilirubin, UA: NEGATIVE
Blood, UA: NEGATIVE
GLUCOSE UA: NEGATIVE mg/dL
Ketones, POC UA: NEGATIVE mg/dL
LEUKOCYTES UA: NEGATIVE
NITRITE UA: NEGATIVE
PROTEIN UA: NEGATIVE mg/dL
Spec Grav, UA: >=1.03 — AB (ref 1.010–1.025)
UROBILINOGEN UA: 0.2 U/dL
pH, UA: 5 (ref 5.0–8.0)

## 2017-03-10 LAB — POCT GLYCOSYLATED HEMOGLOBIN (HGB A1C): HEMOGLOBIN A1C: 6.5

## 2017-03-10 MED ORDER — ATORVASTATIN CALCIUM 40 MG PO TABS: 40.0000 mg | ORAL_TABLET | Freq: Every day | ORAL | 1 refills | Status: DC

## 2017-03-10 MED ORDER — TRIAMCINOLONE ACETONIDE 0.1 % EX CREA: 1.0000 "application " | TOPICAL_CREAM | Freq: Two times a day (BID) | CUTANEOUS | 0 refills | Status: DC

## 2017-03-10 MED FILL — TRIAMCINOLONE 0.1% CREAM: 0.1 | 20 days supply | Qty: 45 | Fill #0

## 2017-03-10 NOTE — Patient Instructions (Addendum)
   IF you received an x-ray today, you will receive an invoice from [facility]. Please contact [facility] at [phone] with questions or concerns regarding your invoice.   IF you received labwork today, you will receive an invoice from [facility]. Please contact [facility] at [phone] with questions or concerns regarding your invoice.   Our billing staff will not be able to assist you with questions regarding bills from these companies.  You will be contacted with the lab results as soon as they are available. The fastest way to get your results is to activate your My Chart account. Instructions are located on the last page of this paperwork. If you have not heard from us regarding the results in 2 weeks, please contact this office.      Health Maintenance, Male A healthy lifestyle and preventive care is important for your health and wellness. Ask your health care provider about what schedule of regular examinations is right for you. What should I know about weight and diet? Eat a Healthy Diet  Eat plenty of vegetables, fruits, whole grains, low-fat dairy products, and lean protein.  Do not eat a lot of foods high in solid fats, added sugars, or salt.  Maintain a Healthy Weight Regular exercise can help you achieve or maintain a healthy weight. You should:  Do at least 150 minutes of exercise each week. The exercise should increase your heart rate and make you sweat (moderate-intensity exercise).  Do strength-training exercises at least twice a week.  Watch Your Levels of Cholesterol and Blood Lipids  Have your blood tested for lipids and cholesterol every 5 years starting at 71 years of age. If you are at high risk for heart disease, you should start having your blood tested when you are 71 years old. You may need to have your cholesterol levels checked more often if: ? Your lipid or cholesterol levels are high. ? You are older than 71 years of age. ? You  are at high risk for heart disease.  What should I know about cancer screening? Many types of cancers can be detected early and may often be prevented. Lung Cancer  You should be screened every year for lung cancer if: ? You are a current smoker who has smoked for at least 30 years. ? You are a former smoker who has quit within the past 15 years.  Talk to your health care provider about your screening options, when you should start screening, and how often you should be screened.  Colorectal Cancer  Routine colorectal cancer screening usually begins at 71 years of age and should be repeated every 5-10 years until you are 71 years old. You may need to be screened more often if early forms of precancerous polyps or small growths are found. Your health care provider may recommend screening at an earlier age if you have risk factors for colon cancer.  Your health care provider may recommend using home test kits to check for hidden blood in the stool.  A small camera at the end of a tube can be used to examine your colon (sigmoidoscopy or colonoscopy). This checks for the earliest forms of colorectal cancer.  Prostate and Testicular Cancer  Depending on your age and overall health, your health care provider may do certain tests to screen for prostate and testicular cancer.  Talk to your health care provider about any symptoms or concerns you have about testicular or prostate cancer.  Skin Cancer  Check your skin   from head to toe regularly.  Tell your health care provider about any new moles or changes in moles, especially if: ? There is a change in a mole's size, shape, or color. ? You have a mole that is larger than a pencil eraser.  Always use sunscreen. Apply sunscreen liberally and repeat throughout the day.  Protect yourself by wearing long sleeves, pants, a wide-brimmed hat, and sunglasses when outside.  What should I know about heart disease, diabetes, and high blood  pressure?  If you are 18-39 years of age, have your blood pressure checked every 3-5 years. If you are 40 years of age or older, have your blood pressure checked every year. You should have your blood pressure measured twice-once when you are at a hospital or clinic, and once when you are not at a hospital or clinic. Record the average of the two measurements. To check your blood pressure when you are not at a hospital or clinic, you can use: ? An automated blood pressure machine at a pharmacy. ? A home blood pressure monitor.  Talk to your health care provider about your target blood pressure.  If you are between 45-79 years old, ask your health care provider if you should take aspirin to prevent heart disease.  Have regular diabetes screenings by checking your fasting blood sugar level. ? If you are at a normal weight and have a low risk for diabetes, have this test once every three years after the age of 45. ? If you are overweight and have a high risk for diabetes, consider being tested at a younger age or more often.  A one-time screening for abdominal aortic aneurysm (AAA) by ultrasound is recommended for men aged 65-75 years who are current or former smokers. What should I know about preventing infection? Hepatitis B If you have a higher risk for hepatitis B, you should be screened for this virus. Talk with your health care provider to find out if you are at risk for hepatitis B infection. Hepatitis C Blood testing is recommended for:  Everyone born from 1945 through 1965.  Anyone with known risk factors for hepatitis C.  Sexually Transmitted Diseases (STDs)  You should be screened each year for STDs including gonorrhea and chlamydia if: ? You are sexually active and are younger than 71 years of age. ? You are older than 71 years of age and your health care provider tells you that you are at risk for this type of infection. ? Your sexual activity has changed since you were last  screened and you are at an increased risk for chlamydia or gonorrhea. Ask your health care provider if you are at risk.  Talk with your health care provider about whether you are at high risk of being infected with HIV. Your health care provider may recommend a prescription medicine to help prevent HIV infection.  What else can I do?  Schedule regular health, dental, and eye exams.  Stay current with your vaccines (immunizations).  Do not use any tobacco products, such as cigarettes, chewing tobacco, and e-cigarettes. If you need help quitting, ask your health care provider.  Limit alcohol intake to no more than 2 drinks per day. One drink equals 12 ounces of beer, 5 ounces of wine, or 1 ounces of hard liquor.  Do not use street drugs.  Do not share needles.  Ask your health care provider for help if you need support or information about quitting drugs.  Tell your health care   provider if you often feel depressed.  Tell your health care provider if you have ever been abused or do not feel safe at home. This information is not intended to replace advice given to you by your health care provider. Make sure you discuss any questions you have with your health care provider. Document Released: 07/06/2007 Document Revised: 09/06/2015 Document Reviewed: 10/11/2014 Elsevier Interactive Patient Education  2018 Elsevier Inc.  

## 2017-03-10 NOTE — Progress Notes (Signed)
Subjective:  This chart was scribed for Shawnee Knapp, MD by Tamsen Roers, at [place] at [facility].  This patient was seen in room 1 and the patient's care was started at 10:03 AM.    Patient ID: Alexander [name], male    DOB: 1945-08-04, 72 y.o.   MRN: [id number] Chief Complaint  Patient presents with  . Annual Exam    HPI HPI Comments: Alexander [name] is a 72 y.o. male who presents to Primary Care at [facility] for a physical exam. Patient had annual wellness visit 10-2016 with Dewitt Hoes, I last saw him three moths prior for acute lower back pain.   Primary Preventative Screenings: Prostate Cancer: Patient has a history of prostate cancer. He was seeing Dr. Jerilynn Mages. Jonette Eva in [place] for this. He is unable to recall if he had a PSA test completed within the last year.  STI screening: Declines need Colorectal Cancer: Colonoscopy: 11/03/2012 By Dr Benson Norway, states repeat in 3-5 years depending on pathology, had tubular adenomas and hyperplastic polyps. reccommended follow up not recorded. --- Patient states that he was supposed to have a follow up last year but was not able to go. He denies any abnormal bowel/bladder movements.  Tobacco use/AAA/Lung/EtOH/Illicit substances:  Still smoking x 51 yrs Cardiac: Last saw cardiology 08/2013 for cardiac cath after which he was given clearance for general anesthesia Weight/Blood sugar/Diet/Exercise: BMI Readings from Last 3 Encounters:  03/10/17 21.43 kg/m  12/11/16 21.60 kg/m  12/05/16 22.11 kg/m   Lab Results  Component Value Date   HGBA1C 6.5 03/10/2017   OTC/Vit/Supp/Herbal: only whats on med list. No otc nsaid Dentist/Optho: Saw [facility] 2 yrs ago (2016)  - overdue to go back but would like to as notes poor vision Immunizations: He would not like a pneumonia vaccination today.  Immunization History  Administered Date(s) Administered  . Td 07/17/2014    Chronic Medical Conditions: Hypertension: Patient states that he checks his  blood pressure regularly and has "normal readings" around 128-129/79. He stopped taking Lisinopril as he was allergic to it - angioedema seen in ER.  Denies any swelling in his legs. He is currently smoking a pack per day.   Rash: Patient has a rash on his left ankle which he noticed three weeks ago.  He has been putting cream on it and thinks it may be due to his work boots.   Diabetes: Patient takes his Metformin 2X per day.  He is compliant with this medication and denies any side effects. He would like to see a podiatrist at a later time to address his fungal toe nails.   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 Hal Neer in 2015 but never happened - pt decided he could live w/ the pain for now. Patient takes Tizanidine and Meloxicam- once every night before bed time. He also takes Gabapentin (2 in the morning and 2 in the evening). He takes Hydrocodone (only when needed- usually three days per week) for his back pain.    Depression Screening:  Depression screen [facility] 2/9 03/10/2017 12/11/2016 12/04/2016 10/28/2016 10/24/2016  Decreased Interest 0 0 0 0 0  Down, Depressed, Hopeless 0 0 0 0 0  PHQ - 2 Score 0 0 0 0 0   Past Medical History:  Diagnosis Date  . Cancer [facility])    prostate  . Diabetes mellitus without complication ([place])   . Hypertension   .  Neuromuscular disorder ([place])   . Peripheral arterial disease ([place]) 11/14/2016   non-occlusive, asymptomatic, seen by vascular surgery Dr. Oneida Alar who rec repeated ABIs annually    Current Outpatient Medications on File Prior to Visit  Medication Sig Dispense Refill  . amLODipine (NORVASC) 10 MG tablet Take 1 tablet (10 mg total) by mouth daily. 90 tablet 1  . aspirin EC 81 MG tablet Take 81 mg by mouth daily.    . Blood Glucose Monitoring Suppl W/DEVICE KIT 1 each by Does not apply route daily. 100 each 3  . fluticasone (FLONASE) 50  MCG/ACT nasal spray Place 2 sprays into both nostrils daily. 16 g 6  . gabapentin (NEURONTIN) 300 MG capsule Take 3 capsules (900 mg total) by mouth 2 (two) times daily. 540 capsule 1  . HYDROcodone-acetaminophen (NORCO) 10-325 MG tablet Take 1 tablet by mouth every 6 (six) hours as needed for moderate pain. 60 tablet 0  . meloxicam (MOBIC) 7.5 MG tablet Take 1 tablet (7.5 mg total) by mouth daily. 30 tablet 1  . metFORMIN (GLUCOPHAGE) 850 MG tablet TAKE 1 TABLET BY MOUTH TWO  TIMES DAILY WITH A MEAL 180 tablet 1  . montelukast (SINGULAIR) 10 MG tablet Take 1 tablet (10 mg total) by mouth at bedtime. 90 tablet 1  . tiZANidine (ZANAFLEX) 4 MG tablet Take 1 tablet (4 mg total) by mouth every 8 (eight) hours as needed for muscle spasms. In back/legs/feet. 90 tablet 0  . vitamin C (ASCORBIC ACID) 500 MG tablet Take 500 mg daily by mouth.    . sildenafil (VIAGRA) 100 MG tablet Take 100 mg by mouth.    . [DISCONTINUED] lisinopril (PRINIVIL,ZESTRIL) 10 MG tablet TAKE ONE TABLET BY MOUTH ONCE DAILY 30 tablet 9   No current facility-administered medications on file prior to visit.     Allergies  Allergen Reactions  . Lisinopril Swelling    angioedema   Past Surgical History:  Procedure Laterality Date  . COLON SURGERY     "locked bowel" fixed  . HERNIA REPAIR    . LEFT HEART CATHETERIZATION WITH CORONARY ANGIOGRAM N/A 09/15/2013   Procedure: LEFT HEART CATHETERIZATION WITH CORONARY ANGIOGRAM;  Surgeon: Peter M Martinique, MD;  Location: [facility] CATH LAB;  Service: Cardiovascular;  Laterality: N/A;  . PROSTATE SURGERY  2005   Family History  Problem Relation Age of Onset  . Diabetes Mother   . Depression Father   . Hyperlipidemia Sister    Social History   Socioeconomic History  . Marital status: Widowed    Spouse name: None  . Number of children: 2  . Years of education: 9  . Highest education level: None  Social Needs  . Financial resource strain: None  . Food insecurity - worry: None  .  Food insecurity - inability: None  . Transportation needs - medical: None  . Transportation needs - non-medical: None  Occupational History  . Occupation: retired  Tobacco Use  . Smoking status: Current Every Day Smoker    Packs/day: 0.00    Years: 51.00    Pack years: 0.00    Types: Cigarettes  . Smokeless tobacco: Never Used  Substance and Sexual Activity  . Alcohol use: No  . Drug use: No  . Sexual activity: Yes  Other Topics Concern  . None  Social History Narrative   Patient is single and his daughter lives with him.   Patient has two children.   Patient has a 9th grade education.   Patient works in  [employer] (part-time).   Patient drinks one to two cups of coffee daily.   Patient is right-handed.          Review of Systems  Constitutional: Negative for chills and fever.  Eyes: Negative for pain and redness.  Respiratory: Negative for cough, choking and shortness of breath.   Gastrointestinal: Negative for constipation, nausea and vomiting.  Genitourinary: Negative for difficulty urinating and frequency.  Musculoskeletal: Positive for back pain.  Skin: Positive for rash.  Neurological: Negative for speech difficulty.      Objective:   Physical Exam  Constitutional: He is oriented to person, place, and time. He appears well-developed and well-nourished. No distress.  HENT:  Head: Normocephalic and atraumatic.  Right Ear: External ear and ear canal normal. Tympanic membrane is retracted.  Left Ear: External ear and ear canal normal. Tympanic membrane is retracted.  Nose: Rhinorrhea present.  Mouth/Throat: Uvula is midline and mucous membranes are normal. Posterior oropharyngeal erythema present. No oropharyngeal exudate or posterior oropharyngeal edema.  TMs retracted, nares with some rhinitis, oropharynx with some erythema and post nasal drip.   Eyes: Conjunctivae are normal. Right eye exhibits no discharge. Left eye exhibits no discharge. No scleral icterus.    Neck: Normal range of motion. Neck supple. No thyromegaly present.  Firm tonsillar nodes, normal thyroid, no anterior or posterior cervical lymphadenopathy.   Cardiovascular: Normal rate, regular rhythm, S1 normal, S2 normal, normal heart sounds and intact distal pulses.  No lower extremity edema. 2+ pedal pulses.   Pulmonary/Chest: Effort normal. No respiratory distress. He has rales in the right lower field and the left lower field.  Normal air movement but bibasilar rales.   Abdominal: Soft. Bowel sounds are normal. He exhibits no distension and no mass. There is no tenderness. There is no rebound and no guarding.  Genitourinary:  Genitourinary Comments: Prostate: increased rectal tone, prostate was very distal, able to just feel proximal edge, was very steep and enlarged.   Musculoskeletal: He exhibits no edema.  Lymphadenopathy:    He has no cervical adenopathy.  Neurological: He is alert and oriented to person, place, and time. He has normal reflexes. No cranial nerve deficit. He exhibits normal muscle tone.  Skin: Skin is warm and dry. No rash noted. He is not diaphoretic. No erythema.  Few calloses, mild tinea on base of foot, mild onychomycosis  Psychiatric: He has a normal mood and affect. His behavior is normal.   Vitals:   03/10/17 0915  BP: (!) 148/84  Pulse: 68  Resp: 16  Temp: 98.5 F (36.9 C)  SpO2: 96%  Weight: 132 lb 12.8 oz (60.2 kg)  Height: 5' 6"  (1.676 m)    Visual Acuity Screening   Right eye Left eye Both eyes  Without correction: 20/50 20/50 - on paper CMA noted that this was 20/100 20/50  With correction:       EKG: normal sinus rhythm. No acute ischemic changes compared to prior EKG 09/06/2013. Has no significant change. He has inverted T waves in the lateral chest leads consistent with prior and right atrial enlargement consisted with prior.      Assessment & Plan:   1. Annual physical exam - declines [id number] - will consider at next OV  2.  Screening for cardiovascular, respiratory, and genitourinary diseases   3. Screening for colorectal cancer   4. Screening for prostate cancer   5. Type 2 diabetes mellitus with other circulatory complications (HCC) - well controlled on metformin 850 bid, continue,  refill x 6 mos when requested.  Poor vision screen - referred back to Dr. Katy Fitch for this and annual Dm eye exam as well - overdue  6. Essential hypertension - well controlled outside office. Not on acei due to angioedema allergy  7. Coronary artery disease involving native coronary artery of native heart without angina pectoris   8. Peripheral arterial occlusive disease ([place])   9. Left lumbar radiculopathy - pt decreased gabapentin to 600 bid rather than 900 bid, cont zanaflex qhs - ok to refill w/ 90 tabs x 1 refill when requested. refilled prn hydrocodone - ok to call for refill prior to next OV if needed, use meloxicam 7.5 qd prn sparingly due to h/o CAD  10. History of prostate cancer - prev seen by Dr. Nevada Crane at [facility] by pt doesn't remember last appt or when psa was checked. psa and ua nml today so will cont to monitor reg.  11. Tobacco use disorder - referred for screening lung LDCT  12. Hyperlipidemia LDL goal <70 - restart statin - atorvastatin 40 sent to pharm (held temporarily to see if leg pain would improve) - needs repeat ALT at f/u OV  13. Medication monitoring encounter   14. Tubular adenoma of colon - overdue for repeat colonoscopy - referred back to Dr. Benson Norway  15. Irritant contact dermatitis, unspecified trigger - from boot? Try top TAC    Orders Placed This Encounter  Procedures  . Pneumococcal conjugate vaccine 13-valent IM    Standing Status:   Future    Standing Expiration Date:   09/08/2018  . Comprehensive metabolic panel    Order Specific Question:   Has the patient fasted?    Answer:   Yes  . PSA  . Ambulatory referral to Gastroenterology    Referral Priority:   Routine     Referral Type:   Consultation    Referral Reason:   Specialty Services Required    Number of Visits Requested:   1  . Ambulatory referral to Ophthalmology    Referral Priority:   Routine    Referral Type:   Consultation    Referral Reason:   Specialty Services Required    Requested Specialty:   Ophthalmology    Number of Visits Requested:   1  . Ambulatory Referral for Lung Cancer Scre    Referral Priority:   Routine    Referral Type:   Consultation    Referral Reason:   Specialty Services Required    Number of Visits Requested:   1  . POCT urinalysis dipstick  . POCT glycosylated hemoglobin (Hb A1C)  . EKG 12-Lead    Meds ordered this encounter  Medications  . triamcinolone cream (KENALOG) 0.1 %    Sig: Apply 1 application topically 2 (two) times daily.    Dispense:  45 g    Refill:  0  . atorvastatin (LIPITOR) 40 MG tablet    Sig: Take 1 tablet (40 mg total) by mouth daily.    Dispense:  90 tablet    Refill:  1    I personally performed the services described in this documentation, which was scribed in my presence. The recorded information has been reviewed and considered, and addended by me as needed.   [name], M.D.  Primary Care at [facility] [place], [place] [address] [phone] phone [phone] fax  03/11/17 9:45 PM  Results for orders placed or performed in visit on  03/10/17  Comprehensive metabolic panel  Result Value Ref Range   Glucose 121 (H) 65 - 99 mg/dL   BUN 12 8 - 27 mg/dL   Creatinine, Ser 0.86 0.76 - 1.27 mg/dL   GFR calc non Af Amer 88 >59 mL/min/1.73   GFR calc Af Amer 102 >59 mL/min/1.73   BUN/Creatinine Ratio 14 10 - 24   Sodium 142 134 - 144 mmol/L   Potassium 4.1 3.5 - 5.2 mmol/L   Chloride 105 96 - 106 mmol/L   CO2 22 20 - 29 mmol/L   Calcium 9.8 8.6 - 10.2 mg/dL   Total Protein 6.9 6.0 - 8.5 g/dL   Albumin 4.3 3.5 - 4.8 g/dL   Globulin, Total 2.6 1.5 - 4.5 g/dL   Albumin/Globulin Ratio 1.7 1.2 - 2.2     Bilirubin Total <0.2 0.0 - 1.2 mg/dL   Alkaline Phosphatase 111 39 - 117 IU/L   AST 11 0 - 40 IU/L   ALT 10 0 - 44 IU/L  PSA  Result Value Ref Range   Prostate Specific Ag, Serum 0.6 0.0 - 4.0 ng/mL  POCT urinalysis dipstick  Result Value Ref Range   Color, UA yellow yellow   Clarity, UA clear clear   Glucose, UA negative negative mg/dL   Bilirubin, UA negative negative   Ketones, POC UA negative negative mg/dL   Spec Grav, UA >=1.030 (A) 1.010 - 1.025   Blood, UA negative negative   pH, UA 5.0 5.0 - 8.0   Protein Ur, POC negative negative mg/dL   Urobilinogen, UA 0.2 0.2 or 1.0 E.U./dL   Nitrite, UA Negative Negative   Leukocytes, UA Negative Negative  POCT glycosylated hemoglobin (Hb A1C)  Result Value Ref Range   Hemoglobin A1C 6.5

## 2017-03-11 ENCOUNTER — Other Ambulatory Visit: Payer: Self-pay | Admitting: Acute Care

## 2017-03-11 DIAGNOSIS — Z122 Encounter for screening for malignant neoplasm of respiratory organs: Secondary | ICD-10-CM

## 2017-03-11 DIAGNOSIS — F17210 Nicotine dependence, cigarettes, uncomplicated: Principal | ICD-10-CM

## 2017-03-11 LAB — COMPREHENSIVE METABOLIC PANEL
A/G RATIO: 1.7 (ref 1.2–2.2)
ALBUMIN: 4.3 g/dL (ref 3.5–4.8)
ALK PHOS: 111 IU/L (ref 39–117)
ALT: 10 IU/L (ref 0–44)
AST: 11 IU/L (ref 0–40)
BUN / CREAT RATIO: 14 (ref 10–24)
BUN: 12 mg/dL (ref 8–27)
CHLORIDE: 105 mmol/L (ref 96–106)
CO2: 22 mmol/L (ref 20–29)
Calcium: 9.8 mg/dL (ref 8.6–10.2)
Creatinine, Ser: 0.86 mg/dL (ref 0.76–1.27)
GFR calc Af Amer: 102 mL/min/{1.73_m2} (ref >59)
GFR calc non Af Amer: 88 mL/min/{1.73_m2} (ref >59)
GLOBULIN, TOTAL: 2.6 g/dL (ref 1.5–4.5)
Glucose: 121 mg/dL — ABNORMAL HIGH (ref 65–99)
Potassium: 4.1 mmol/L (ref 3.5–5.2)
SODIUM: 142 mmol/L (ref 134–144)
Total Protein: 6.9 g/dL (ref 6.0–8.5)

## 2017-03-11 LAB — PSA: Prostate Specific Ag, Serum: 0.6 ng/mL (ref 0.0–4.0)

## 2017-03-26 ENCOUNTER — Encounter: Payer: Self-pay | Admitting: Acute Care

## 2017-03-26 ENCOUNTER — Ambulatory Visit: Admission: RE | Admit: 2017-03-26 | Payer: Medicare Other | Source: Ambulatory Visit

## 2017-03-26 ENCOUNTER — Ambulatory Visit (INDEPENDENT_AMBULATORY_CARE_PROVIDER_SITE_OTHER): Payer: Self-pay | Admitting: Acute Care

## 2017-03-26 DIAGNOSIS — F17210 Nicotine dependence, cigarettes, uncomplicated: Secondary | ICD-10-CM

## 2017-03-26 NOTE — Progress Notes (Signed)
Shared Decision Making Visit Lung Cancer Screening Program [id number])   Eligibility:  Age 72 y.o.  Pack Years Smoking History Calculation 55 pack year smoking history (# packs/per year x # years smoked)  Recent History of coughing up blood  no  Unexplained weight loss? no ( >Than 15 pounds within the last 6 months )  Prior History Lung / other cancer no (Diagnosis within the last 5 years already requiring surveillance chest CT Scans).  Smoking Status Current Smoker  Former Smokers: Years since quit: NA  Quit Date: NA  Visit Components:  Discussion included one or more decision making aids. yes  Discussion included risk/benefits of screening. yes  Discussion included potential follow up diagnostic testing for abnormal scans. yes  Discussion included meaning and risk of over diagnosis. yes  Discussion included meaning and risk of False Positives. yes  Discussion included meaning of total radiation exposure. yes  Counseling Included:  Importance of adherence to annual lung cancer LDCT screening. yes  Impact of comorbidities on ability to participate in the program. yes  Ability and willingness to under diagnostic treatment. yes  Smoking Cessation Counseling:  Current Smokers:   Discussed importance of smoking cessation. yes  Information about tobacco cessation classes and interventions provided to patient. yes  Patient provided with "ticket" for LDCT Scan. yes  Symptomatic Patient. no  Counseling  Diagnosis Code: Tobacco Use Z72.0  Asymptomatic Patient yes  Counseling (Intermediate counseling: > three minutes counseling) E9381  Former Smokers:   Discussed the importance of maintaining cigarette abstinence. yes  Diagnosis Code: Personal History of Nicotine Dependence. O17.510  Information about tobacco cessation classes and interventions provided to patient. Yes  Patient provided with "ticket" for LDCT Scan. yes  Written Order for Lung Cancer  Screening with LDCT placed in Epic. Yes (CT Chest Lung Cancer Screening Low Dose W/O CM) CHE5277 Z12.2-Screening of respiratory organs Z87.891-Personal history of nicotine dependence  I have spent 25 minutes of face to face time with Mr. Arney discussing the risks and benefits of lung cancer screening. We viewed a power point together that explained in detail the above noted topics. We paused at intervals to allow for questions to be asked and answered to ensure understanding.We discussed that the single most powerful action that he can take to decrease his risk of developing lung cancer is to quit smoking. We discussed whether or not he is ready to commit to setting a quit date. We discussed options for tools to aid in quitting smoking including nicotine replacement therapy, non-nicotine medications, support groups, Quit Smart classes, and behavior modification. We discussed that often times setting smaller, more achievable goals, such as eliminating 1 cigarette a day for a week and then 2 cigarettes a day for a week can be helpful in slowly decreasing the number of cigarettes smoked. This allows for a sense of accomplishment as well as providing a clinical benefit. I gave him the " Be Stronger Than Your Excuses" card with contact information for community resources, classes, free nicotine replacement therapy, and access to mobile apps, text messaging, and on-line smoking cessation help. I have also given him my card and contact information in the event he needs to contact me. We discussed the time and location of the scan, and that either Doroteo Glassman RN or I will call with the results within 24-48 hours of receiving them. I have offered him  a copy of the power point we viewed  as a resource in the event they need reinforcement of  the concepts we discussed today in the office. The patient verbalized understanding of all of  the above and had no further questions upon leaving the office. They have my  contact information in the event they have any further questions.  I spent 4 minutes counseling on smoking cessation and the health risks of continued tobacco abuse.  I explained to the patient that there has been a high incidence of coronary artery disease noted on these exams. I explained that this is a non-gated exam therefore degree or severity cannot be determined. This patient is currently on statin therapy. I have asked the patient to follow-up with their PCP regarding any incidental finding of coronary artery disease and management with diet or medication as their PCP  feels is clinically indicated. The patient verbalized understanding of the above and had no further questions upon completion of the visit.  Magdalen Spatz, NP 03/26/2017 12:20 PM

## 2017-04-03 ENCOUNTER — Inpatient Hospital Stay: Admission: RE | Admit: 2017-04-03 | Payer: Self-pay | Source: Ambulatory Visit

## 2017-04-30 ENCOUNTER — Encounter: Payer: Self-pay | Admitting: Physician Assistant

## 2017-05-05 ENCOUNTER — Ambulatory Visit (INDEPENDENT_AMBULATORY_CARE_PROVIDER_SITE_OTHER)
Admission: RE | Admit: 2017-05-05 | Discharge: 2017-05-05 | Disposition: A | Discharge status: Home / Self Care | Payer: Medicare Other | Source: Ambulatory Visit | Attending: Acute Care | Admitting: Acute Care

## 2017-05-05 DIAGNOSIS — F17210 Nicotine dependence, cigarettes, uncomplicated: Secondary | ICD-10-CM | POA: Diagnosis not present

## 2017-05-05 DIAGNOSIS — Z122 Encounter for screening for malignant neoplasm of respiratory organs: Secondary | ICD-10-CM

## 2017-05-07 ENCOUNTER — Other Ambulatory Visit: Payer: Self-pay | Admitting: Acute Care

## 2017-05-07 DIAGNOSIS — Z122 Encounter for screening for malignant neoplasm of respiratory organs: Secondary | ICD-10-CM

## 2017-05-07 DIAGNOSIS — F17210 Nicotine dependence, cigarettes, uncomplicated: Principal | ICD-10-CM

## 2017-05-08 ENCOUNTER — Encounter: Payer: Self-pay | Admitting: Family Medicine

## 2017-05-08 DIAGNOSIS — Z86010 Personal history of colonic polyps: Secondary | ICD-10-CM | POA: Diagnosis not present

## 2017-05-08 DIAGNOSIS — D125 Benign neoplasm of sigmoid colon: Secondary | ICD-10-CM | POA: Diagnosis not present

## 2017-05-08 DIAGNOSIS — D122 Benign neoplasm of ascending colon: Secondary | ICD-10-CM | POA: Diagnosis not present

## 2017-05-08 DIAGNOSIS — K635 Polyp of colon: Secondary | ICD-10-CM | POA: Diagnosis not present

## 2017-05-08 DIAGNOSIS — K5730 Diverticulosis of large intestine without perforation or abscess without bleeding: Secondary | ICD-10-CM | POA: Diagnosis not present

## 2017-05-13 DIAGNOSIS — H25813 Combined forms of age-related cataract, bilateral: Secondary | ICD-10-CM | POA: Diagnosis not present

## 2017-05-13 DIAGNOSIS — H35372 Puckering of macula, left eye: Secondary | ICD-10-CM | POA: Diagnosis not present

## 2017-05-13 DIAGNOSIS — H17822 Peripheral opacity of cornea, left eye: Secondary | ICD-10-CM | POA: Diagnosis not present

## 2017-05-13 DIAGNOSIS — E119 Type 2 diabetes mellitus without complications: Secondary | ICD-10-CM | POA: Diagnosis not present

## 2017-05-13 LAB — HM DIABETES EYE EXAM

## 2017-05-26 ENCOUNTER — Encounter: Payer: Self-pay | Admitting: *Deleted

## 2017-05-26 MED FILL — AMOXICILLIN 500 MG CAPSULE: 500 | 10 days supply | Qty: 30 | Fill #0

## 2017-05-30 ENCOUNTER — Other Ambulatory Visit: Payer: Self-pay | Admitting: Family Medicine

## 2017-07-07 ENCOUNTER — Other Ambulatory Visit: Payer: Self-pay

## 2017-07-07 ENCOUNTER — Encounter: Payer: Self-pay | Admitting: Family Medicine

## 2017-07-07 ENCOUNTER — Ambulatory Visit: Payer: Medicare Other | Admitting: Family Medicine

## 2017-07-07 VITALS — BP 130/76 | HR 88 | Temp 98.2°F | Resp 16 | Ht 64.0 in | Wt 126.6 lb

## 2017-07-07 DIAGNOSIS — M545 Low back pain: Secondary | ICD-10-CM | POA: Diagnosis not present

## 2017-07-07 DIAGNOSIS — I10 Essential (primary) hypertension: Secondary | ICD-10-CM | POA: Diagnosis not present

## 2017-07-07 DIAGNOSIS — I2510 Atherosclerotic heart disease of native coronary artery without angina pectoris: Secondary | ICD-10-CM | POA: Diagnosis not present

## 2017-07-07 DIAGNOSIS — G8929 Other chronic pain: Secondary | ICD-10-CM

## 2017-07-07 DIAGNOSIS — E1159 Type 2 diabetes mellitus with other circulatory complications: Secondary | ICD-10-CM | POA: Diagnosis not present

## 2017-07-07 DIAGNOSIS — E785 Hyperlipidemia, unspecified: Secondary | ICD-10-CM | POA: Diagnosis not present

## 2017-07-07 LAB — LIPID PANEL
CHOLESTEROL TOTAL: 91 mg/dL — AB (ref 100–199)
Chol/HDL Ratio: 2.4 ratio (ref 0.0–5.0)
HDL: 38 mg/dL — AB (ref >39)
LDL Calculated: 43 mg/dL (ref 0–99)
TRIGLYCERIDES: 48 mg/dL (ref 0–149)
VLDL CHOLESTEROL CAL: 10 mg/dL (ref 5–40)

## 2017-07-07 LAB — COMPREHENSIVE METABOLIC PANEL
ALBUMIN: 4.3 g/dL (ref 3.5–4.8)
ALK PHOS: 128 IU/L — AB (ref 39–117)
ALT: 10 IU/L (ref 0–44)
AST: 11 IU/L (ref 0–40)
Albumin/Globulin Ratio: 1.6 (ref 1.2–2.2)
BUN / CREAT RATIO: 15 (ref 10–24)
BUN: 13 mg/dL (ref 8–27)
Bilirubin Total: 0.4 mg/dL (ref 0.0–1.2)
CO2: 22 mmol/L (ref 20–29)
Calcium: 9.8 mg/dL (ref 8.6–10.2)
Chloride: 102 mmol/L (ref 96–106)
Creatinine, Ser: 0.89 mg/dL (ref 0.76–1.27)
GFR calc non Af Amer: 86 mL/min/{1.73_m2} (ref >59)
GFR, EST AFRICAN AMERICAN: 99 mL/min/{1.73_m2} (ref >59)
GLOBULIN, TOTAL: 2.7 g/dL (ref 1.5–4.5)
GLUCOSE: 125 mg/dL — AB (ref 65–99)
Potassium: 4.2 mmol/L (ref 3.5–5.2)
SODIUM: 139 mmol/L (ref 134–144)
TOTAL PROTEIN: 7 g/dL (ref 6.0–8.5)

## 2017-07-07 LAB — POCT GLYCOSYLATED HEMOGLOBIN (HGB A1C): HEMOGLOBIN A1C: 6.9 % — AB (ref 4.0–5.6)

## 2017-07-07 MED ORDER — TRIAMCINOLONE ACETONIDE 0.1 % EX CREA: 1.0000 "application " | TOPICAL_CREAM | Freq: Two times a day (BID) | CUTANEOUS | 0 refills | Status: DC

## 2017-07-07 MED ORDER — TIZANIDINE HCL 4 MG PO TABS: 4.0000 mg | ORAL_TABLET | Freq: Three times a day (TID) | ORAL | 0 refills | Status: DC | PRN

## 2017-07-07 MED ORDER — HYDROCHLOROTHIAZIDE 25 MG PO TABS: 25.0000 mg | ORAL_TABLET | Freq: Every day | ORAL | 1 refills | Status: DC

## 2017-07-07 MED ORDER — MELOXICAM 7.5 MG PO TABS: 7.5000 mg | ORAL_TABLET | Freq: Every day | ORAL | 0 refills | Status: DC | PRN

## 2017-07-07 MED ORDER — FLUTICASONE PROPIONATE 50 MCG/ACT NA SUSP: 2.0000 | Freq: Every day | NASAL | 6 refills | Status: DC

## 2017-07-07 MED ORDER — AMLODIPINE BESYLATE 10 MG PO TABS: 10.0000 mg | ORAL_TABLET | Freq: Every day | ORAL | 1 refills | Status: DC

## 2017-07-07 MED ORDER — METFORMIN HCL 1000 MG PO TABS: 1000.0000 mg | ORAL_TABLET | Freq: Two times a day (BID) | ORAL | 3 refills | Status: DC

## 2017-07-07 MED ORDER — HYDROCODONE-ACETAMINOPHEN 10-325 MG PO TABS: 1.0000 | ORAL_TABLET | Freq: Four times a day (QID) | ORAL | 0 refills | Status: DC | PRN

## 2017-07-07 NOTE — Progress Notes (Signed)
Subjective:  By signing my name below, I, Essence Howell, attest that this documentation has been prepared under the direction and in the presence of [name], MD Electronically Signed: Ladene Artist, ED Scribe 07/07/2017 at 8:57 AM.   Patient ID: Alexander [name], male    DOB: Jun 16, 1945, 72 y.o.   MRN: [id number]  Chief Complaint  Patient presents with  . Diabetes  . Medication Refill    Flonase 50 mcg, Meloxicam 7.28m, Tizanidine 457m Triamcinolone cream 0.1%  . Back Pain    lower, "still hurting some"   HPI Alexander [name] a [age].o. male [facility] Pt is fasting at this visit. Pt states that he has been taking Metformin 500 mg bid instead of 850. Denies nausea, diarrhea, any other symptoms.  HTN Pt occasionally checks his BP outside of the office with readings averaging 140/80s. He reports that he was told that he had a MI in the past based on his EKG but he does not actually recall ever having one. Denies cp, any other symptoms.  Low Back Pain Pt used Meloxicam daily for 2 months. Also requesting a refill of Tizanidine which he thinks it helped his low back pain. States he has been out of all meds for back pain for 3 months. Denies constant, daily back pain. States low back pain only seems to flare up with increased walking and working more. Also takes gabapentin 2-3 times/day.  L Foot Symptoms Pt reports pins and needle sensation in L foot and spasms with toe straightening 2-3 nights ago. He states symptoms resolved after he got up around 3 AM to walk around his home. Denies any symptoms since.  Allergies Occasionally takes Benadryl for allergies.  Smoking Cessation Still smoking but starting nicotine patches this week. He has already received them.  Past Medical History:  Diagnosis Date  . [facility]   prostate  . Diabetes mellitus without complication ([facility]  . Hypertension   . [place]romuscular disorder ([facility]  .  Peripheral arterial disease ([facility]   non-occlusive, asymptomatic, seen by vascular surgery Dr. FiOneida Alarho rec repeated ABIs annually   Current Outpatient Medications on File Prior to Visit  Medication Sig Dispense Refill  . amLODipine (NORVASC) 10 MG tablet Take 1 tablet (10 mg total) by mouth daily. 90 tablet 1  . aspirin EC 81 MG tablet Take 81 mg by mouth daily.    . Marland Kitchentorvastatin (LIPITOR) 40 MG tablet TAKE 1 TABLET BY MOUTH  DAILY 90 tablet 1  . diphenhydrAMINE (BENADRYL) 25 MG tablet Take 25 mg by mouth every 6 (six) hours as needed.    . fluticasone (FLONASE) 50 MCG/ACT nasal spray Place 2 sprays into both nostrils daily. 16 g 6  . gabapentin (NEURONTIN) 300 MG capsule TAKE 3 CAPSULES BY MOUTH  TWO TIMES DAILY 540 capsule 1  . HYDROcodone-acetaminophen (NORCO) 10-325 MG tablet Take 1 tablet by mouth every 6 (six) hours as needed for moderate pain. 60 tablet 0  . meloxicam (MOBIC) 7.5 MG tablet Take 1 tablet (7.5 mg total) by mouth daily. 30 tablet 1  . montelukast (SINGULAIR) 10 MG tablet Take 1 tablet (10 mg total) by mouth at bedtime. 90 tablet 1  . sildenafil (VIAGRA) 100 MG tablet Take 100 mg by mouth.    . Marland KitcheniZANidine (ZANAFLEX) 4 MG tablet Take 1 tablet (4 mg total) by mouth every 8 (eight) hours as needed for muscle spasms.  In back/legs/feet. 90 tablet 0  . triamcinolone cream (KENALOG) 0.1 % Apply 1 application topically 2 (two) times daily. 45 g 0  . Blood Glucose Monitoring Suppl W/DEVICE KIT 1 each by Does not apply route daily. (Patient not taking: Reported on 07/07/2017) 100 each 3  . metFORMIN (GLUCOPHAGE) 850 MG tablet TAKE 1 TABLET BY MOUTH TWO  TIMES DAILY WITH A MEAL 180 tablet 1  . vitamin C (ASCORBIC ACID) 500 MG tablet Take 500 mg daily by mouth.     No current facility-administered medications on file prior to visit.    Allergies  Allergen Reactions  . Lisinopril Swelling    angioedema   Past Surgical History:  Procedure Laterality Date  . COLON  SURGERY     "locked bowel" fixed  . HERNIA REPAIR    . LEFT HEART CATHETERIZATION WITH CORONARY ANGIOGRAM N/A 09/15/2013   Procedure: LEFT HEART CATHETERIZATION WITH CORONARY ANGIOGRAM;  Surgeon: Peter M Martinique, MD;  Location: [facility] CATH LAB;  Service: Cardiovascular;  Laterality: N/A;  . PROSTATE SURGERY  2005   Family History  Problem Relation Age of Onset  . Diabetes Mother   . Depression Father   . Hyperlipidemia Sister    Social History   Socioeconomic History  . Marital status: Widowed    Spouse name: Not on file  . Number of children: 2  . Years of education: 9  . Highest education level: Not on file  Occupational History  . Occupation: retired  Scientific laboratory technician  . Financial resource strain: Not on file  . Food insecurity:    Worry: Not on file    Inability: Not on file  . Transportation needs:    Medical: Not on file    Non-medical: Not on file  Tobacco Use  . Smoking status: Current Every Day Smoker    Packs/day: 1.00    Years: 55.00    Pack years: 55.00    Types: Cigarettes  . Smokeless tobacco: Never Used  Substance and Sexual Activity  . Alcohol use: No  . Drug use: No  . Sexual activity: Yes  Lifestyle  . Physical activity:    Days per week: Not on file    Minutes per session: Not on file  . Stress: Not on file  Relationships  . Social connections:    Talks on phone: Not on file    Gets together: Not on file    Attends religious service: Not on file    Active member of club or organization: Not on file    Attends meetings of clubs or organizations: Not on file    Relationship status: Not on file  Other Topics Concern  . Not on file  Social History Narrative   Patient is single and his daughter lives with him.   Patient has two children.   Patient has a 9th grade education.   Patient works in Architect (part-time).   Patient drinks one to two cups of coffee daily.   Patient is right-handed.         Depression screen [facility] 2/9 07/07/2017 03/10/2017  12/11/2016 12/04/2016 10/28/2016  Decreased Interest 0 0 0 0 0  Down, Depressed, Hopeless 0 0 0 0 0  PHQ - 2 Score 0 0 0 0 0    Review of Systems  Cardiovascular: Negative for chest pain.  Gastrointestinal: Negative for diarrhea and nausea.  Musculoskeletal: Positive for back pain.  Allergic/Immunologic: Positive for environmental allergies.  Neurological: Positive for numbness (L foot; resolved).  Objective:   Physical Exam  Constitutional: He is oriented to person, place, and time. He appears well-developed and well-nourished. No distress.  HENT:  Head: Normocephalic and atraumatic.  Eyes: Conjunctivae and EOM are normal.  Neck: Neck supple. No tracheal deviation present.  Cardiovascular: Normal rate, regular rhythm, S1 normal, S2 normal and normal heart sounds.  Pulmonary/Chest: Effort normal. No respiratory distress. He has rales (inspiratory) in the left lower field.  Musculoskeletal: Normal range of motion.  Neurological: He is alert and oriented to person, place, and time.  Skin: Skin is warm and dry.  Psychiatric: He has a normal mood and affect. His behavior is normal.  Nursing note and vitals reviewed.   BP 130/76 (BP Location: Left Arm, Patient Position: Sitting, Cuff Size: Normal)   Pulse 88   Temp 98.2 F (36.8 C) (Oral)   Resp 16   Ht _0  (1.626 m)   Wt 126 lb 9.6 oz (57.4 kg)   SpO2 94%   BMI 21.73 kg/m    Results for orders placed or performed in visit on 07/07/17  POCT glycosylated hemoglobin (Hb A1C)  Result Value Ref Range   Hemoglobin A1C 6.9 (A) 4.0 - 5.6 %   HbA1c, POC (prediabetic range)  5.7 - 6.4 %   HbA1c, POC (controlled diabetic range)  0.0 - 7.0 %   Assessment & Plan:  Pt still has some hydrocodone left. Plan for rpt EKG and rpt BMP at next OV. Strongly consider ordering echo.  1. Type 2 diabetes mellitus with other circulatory complications ([place])   2. Chronic low back pain, unspecified back pain laterality, with sciatica presence  unspecified   3. Hyperlipidemia LDL goal <70   4. Coronary artery disease involving native coronary artery of native heart without angina pectoris   5. Essential hypertension     Orders Placed This Encounter  Procedures  . Lipid panel    Order Specific Question:   Has the patient fasted?    Answer:   Yes  . Comprehensive metabolic panel    Order Specific Question:   Has the patient fasted?    Answer:   Yes  . POCT glycosylated hemoglobin (Hb A1C)    Meds ordered this encounter  Medications  . fluticasone (FLONASE) 50 MCG/ACT nasal spray    Sig: Place 2 sprays into both nostrils daily.    Dispense:  16 g    Refill:  6  . tiZANidine (ZANAFLEX) 4 MG tablet    Sig: Take 1 tablet (4 mg total) by mouth every 8 (eight) hours as needed for muscle spasms. In back/legs/feet.    Dispense:  90 tablet    Refill:  0  . triamcinolone cream (KENALOG) 0.1 %    Sig: Apply 1 application topically 2 (two) times daily.    Dispense:  45 g    Refill:  0  . meloxicam (MOBIC) 7.5 MG tablet    Sig: Take 1 tablet (7.5 mg total) by mouth daily as needed for pain.    Dispense:  90 tablet    Refill:  0  . HYDROcodone-acetaminophen (NORCO) 10-325 MG tablet    Sig: Take 1 tablet by mouth every 6 (six) hours as needed for moderate pain.    Dispense:  60 tablet    Refill:  0  . amLODipine (NORVASC) 10 MG tablet    Sig: Take 1 tablet (10 mg total) by mouth daily.    Dispense:  90 tablet    Refill:  1  .  hydrochlorothiazide (HYDRODIURIL) 25 MG tablet    Sig: Take 1 tablet (25 mg total) by mouth daily.    Dispense:  90 tablet    Refill:  1  . metFORMIN (GLUCOPHAGE) 1000 MG tablet    Sig: Take 1 tablet (1,000 mg total) by mouth 2 (two) times daily with a meal.    Dispense:  180 tablet    Refill:  3   I personally performed the services described in this documentation, which was scribed in my presence. The recorded information has been reviewed and considered, and addended by me as needed.   [name],  M.D.  Primary Care at [facility] [address] [place], [place] [address] [phone] phone [phone] fax  10/30/17 5:10 AM

## 2017-07-07 NOTE — Patient Instructions (Addendum)
IF you received an x-ray today, you will receive an invoice from [facility]. Please contact [facility] at [phone] with questions or concerns regarding your invoice.   IF you received labwork today, you will receive an invoice from [place]. Please contact [facility] at [phone] with questions or concerns regarding your invoice.   Our billing staff will not be able to assist you with questions regarding bills from these companies.  You will be contacted with the lab results as soon as they are available. The fastest way to get your results is to activate your My Chart account. Instructions are located on the last page of this paperwork. If you have not heard from [place] regarding the results in 2 weeks, please contact this office.     What You Need to Know About Chronic Back Pain Long-term (chronic) back pain is back pain that lasts for 12 weeks or longer. It often affects the lower back and can range from mild to severe. Many people have back pain at some point in their lives. It can feel different to each person. It may feel like a muscle ache or a sharp, stabbing pain. The pain often gets worse over time. It can be difficult to find the cause of chronic back pain. Treating chronic back pain often starts with rest and pain relief, followed by exercises (physical therapy) to strengthen the muscles that support your back. You may have to try different things to see what works best for you. If other treatments do not help, or if your pain is caused by a condition or an injury, you may need surgery. How can back pain affect me? Chronic back pain is uncomfortable and can make it hard to do your usual daily activities. Chronic back pain can:  Cause numbness and tingling.  Come and go.  Get worse when you are sitting, standing, walking, bending, or lifting.  Affect you while you are active, at rest, or both.  Eventually make it hard to move around.  Occur with  fever, weight loss, or difficulty urinating.  What are the benefits of treating back pain? Treating chronic back pain may:  Relieve pain.  Keep your pain from getting worse.  Make it easier for you to do your usual activities.  What are some steps I can take to decrease my back pain?  Take over-the-counter or prescription medicines only as told by your health care provider.  If directed, apply heat to the affected area. Use the heat source that your health care provider recommends, such as a moist heat pack or a heating pad. ? Place a towel between your skin and the heat source. ? Leave the heat on for 20-30 minutes. ? Remove the heat if your skin turns bright red. This is especially important if you are unable to feel pain, heat, or cold. You may have a greater risk of getting burned.  If directed, put ice on the affected area: ? Put ice in a plastic bag. ? Place a towel between your skin and the bag. ? Leave the ice on for 20 minutes, 2-3 times a day.  Get regular exercise as told by your health care provider to improve flexibility and strength.  Do not smoke.  Maintain a healthy weight.  When lifting objects: ? Keep your feet as far apart as your shoulders (shoulder-width apart) or farther apart. ? Tighten the muscles in your abdomen. ? Bend your knees and hips and keep your spine neutral. It  is important to lift using the strength of your legs, not your back. Do not lock your knees straight out. ? Always ask for help to lift heavy or awkward objects. What can happen if my back pain goes untreated? Untreated back pain can:  Get worse over time.  Start to occur more often or at different times, such as when you are resting.  Cause posture problems.  Make it hard to move around (limit mobility).  Where can I get support? Chronic back pain can be a frustrating condition to manage. It may help to talk with other people who are having a similar experience. Consider  joining a support group for people dealing with chronic back pain. Ask your health care provider about support groups in your area. You can also find online and in-person support groups through:  The American Chronic Pain Association: DeluxeOption.si  The U.S. Pain Foundation: uspainfoundation.org/support-groups  Contact a health care provider if:  Your symptoms do not get better or they get worse.  You have severe back pain.  You have chronic back pain and a fever.  You lose weight without trying.  You have difficulty urinating.  You experience numbness or tingling.  You develop new pain after an injury. Summary  Chronic back pain is often treated with rest, pain relief, and physical therapy.  Get regular exercise to improve your strength and flexibility.  Put heat and ice on the affected areas as directed by your health care provider.  Chronic back pain can be challenging to live with. Joining a support group may help you manage your condition. This information is not intended to replace advice given to you by your health care provider. Make sure you discuss any questions you have with your health care provider. Document Released: 01/22/2015 Document Revised: 09/16/2015 Document Reviewed: 09/16/2015 Elsevier Interactive Patient Education  [employer].

## 2017-08-25 ENCOUNTER — Other Ambulatory Visit: Payer: Self-pay | Admitting: Family Medicine

## 2017-08-29 ENCOUNTER — Telehealth: Payer: Self-pay | Admitting: Family Medicine

## 2017-08-29 NOTE — Telephone Encounter (Signed)
Called pt to reschedule their appt 10/09/17. Rescheduled

## 2017-09-15 ENCOUNTER — Telehealth: Payer: Self-pay | Admitting: Family Medicine

## 2017-09-15 NOTE — Telephone Encounter (Signed)
Copied from [place] [phone]. Topic: Quick Communication - Rx Refill/Question >> Sep 15, 2017  9:54 AM Reyne Dumas L wrote: Medication:  Rondel Oh with [facility], pharmacy is calling.  States that pt needs a script for an orthopaedic back and shoulder brace. Barnabas Lister can be reached at [phone], fax [phone] Barnabas Lister states that he faxed this information over on Friday.

## 2017-09-17 NOTE — Telephone Encounter (Signed)
Alexander [name] [phone] called to check the status of the prescription request for the orthopaedic back and shoulder brace. This can be faxed to [phone].

## 2017-09-18 NOTE — Telephone Encounter (Signed)
Message was forwarded to Dr. Brigitte Pulse.

## 2017-09-19 ENCOUNTER — Telehealth: Payer: Self-pay | Admitting: Family Medicine

## 2017-09-19 NOTE — Telephone Encounter (Signed)
Copied from [place] [phone]. Topic: Quick Communication - See Telephone Encounter >> Sep 19, 2017 11:24 AM Neva Seat wrote: Pt needing to [place]

## 2017-09-26 ENCOUNTER — Telehealth: Payer: Self-pay | Admitting: Family Medicine

## 2017-09-29 NOTE — Telephone Encounter (Signed)
Alexander [name] called back in to follow up on Orthopaedic back and shoulder brace Rx. Made Alexander [name] aware that provider is out of the office.   When possible please advise.

## 2017-09-30 NOTE — Telephone Encounter (Signed)
Please advise 

## 2017-10-07 NOTE — Telephone Encounter (Signed)
Please see note below. 

## 2017-10-08 ENCOUNTER — Ambulatory Visit: Payer: Medicare Other | Admitting: Family Medicine

## 2017-10-09 ENCOUNTER — Ambulatory Visit: Payer: Medicare Other | Admitting: Family Medicine

## 2017-10-10 ENCOUNTER — Telehealth: Payer: Self-pay | Admitting: Family Medicine

## 2017-10-10 NOTE — Telephone Encounter (Signed)
Copied from [place] [phone]. Topic: Quick Communication - Rx Refill/Question >> Oct 10, 2017  9:26 AM Sheran Luz wrote: Medication: meloxicam (MOBIC) 7.5 MG tablet [[id number]]   Pt is requesting a refill of this medication. Pt made an appointment for 11/26.   Preferred Pharmacy (with phone number or street name): [place], [place] [facility] [phone] (Phone) [phone] (Fax)

## 2017-10-10 NOTE — Telephone Encounter (Signed)
Copied from [place] [phone]. Topic: Inquiry >> Oct 10, 2017 11:23 AM Margot Ables wrote: Reason for CRM: [facility] is calling to f/u on CMN for pneumatic compression device that was faxed 10/07/17 to [phone] to Dr. Brigitte Pulse. Please call to advise of receipt/status.

## 2017-10-10 NOTE — Telephone Encounter (Addendum)
meloxican refill Last Refill: # 90 Last OV: 07/07/17 PCP: Dr [name] Pharmacy: [facility]  Last CMET 07/07/17

## 2017-10-13 ENCOUNTER — Other Ambulatory Visit: Payer: Self-pay | Admitting: *Deleted

## 2017-10-13 MED ORDER — MELOXICAM 7.5 MG PO TABS: 7.5000 mg | ORAL_TABLET | Freq: Every day | ORAL | 0 refills | Status: DC | PRN

## 2017-10-13 NOTE — Telephone Encounter (Signed)
We cannot fax anything to this company

## 2017-10-13 NOTE — Telephone Encounter (Signed)
Prescription sent

## 2017-10-22 ENCOUNTER — Telehealth: Payer: Self-pay | Admitting: *Deleted

## 2017-10-22 NOTE — Telephone Encounter (Signed)
Alexander [name] called from [employer] stating that he is f/u on CMN faxed 10/07/17 - Spoke to [place] and needing further info on why this cannot be sent - I went back to the [employer] and had been placed on hold. Please advise if he calls again we are still waiting for approval.

## 2017-10-22 NOTE — Telephone Encounter (Signed)
Dr. Brigitte Pulse please disregard previous message I got in touch with patient I will call company

## 2017-10-22 NOTE — Telephone Encounter (Signed)
Dr. Octaviano [facility] is calling again about supplies.  Please Advise.   Is this one of the company's on the [facility] that was posted by faxes.

## 2017-10-23 ENCOUNTER — Telehealth: Payer: Self-pay | Admitting: *Deleted

## 2017-10-23 NOTE — Telephone Encounter (Signed)
Called patient states he does not know this company.

## 2017-10-23 NOTE — Telephone Encounter (Signed)
Spoke with patient he did not approve any supplies from [employer].  Please advise if Alexander [name] calls back we will not be faxing over any orders for supplies per patient.

## 2017-10-29 ENCOUNTER — Other Ambulatory Visit: Payer: Self-pay | Admitting: Family Medicine

## 2017-10-29 NOTE — Telephone Encounter (Signed)
Requested Prescriptions  Pending Prescriptions Disp Refills  . montelukast (SINGULAIR) 10 MG tablet [Pharmacy Med Name: MONTELUKAST 10MG  TABLET] 90 tablet 1    Sig: TAKE 1 TABLET BY MOUTH AT  BEDTIME     Pulmonology:  Leukotriene Inhibitors Passed - 10/29/2017  4:43 AM      Passed - Valid encounter within last 12 months    Recent Outpatient Visits          3 months ago Type 2 diabetes mellitus with other circulatory complications [facility])   [facility] at [place], Laurey Arrow, MD   7 months ago Annual physical exam   Primary Care at [place], Laurey Arrow, MD   10 months ago Acute right-sided low back pain without sciatica   [facility] at [place], Laurey Arrow, MD   10 months ago Flank pain   Primary Care at [place], [place], [place]   1 year ago Chronic pain of left lower extremity   Primary Care at [place], Laurey Arrow, MD      Future Appointments            In 1 month Brigitte Pulse Laurey Arrow, MD Primary Care at [place], [facility]

## 2017-11-06 ENCOUNTER — Other Ambulatory Visit: Payer: Self-pay

## 2017-11-06 NOTE — Patient Outreach (Signed)
[facility]) Care Management  11/06/2017  Musab Wingard [place] 1945/09/14 [id number]   Medication Adherence call to Mr. Dayson Aboud left a message for patient to call back patient is due on Atorvastatin 40 mg. Mr. Siefert is showing past due under [place].   [facility] Direct Dial [phone]  Fax [phone] [email or web address]

## 2017-11-10 ENCOUNTER — Telehealth: Payer: Self-pay | Admitting: Family Medicine

## 2017-11-10 NOTE — Telephone Encounter (Signed)
Called and spoke with pt regarding their appt with Dr. Brigitte Pulse on 12/16/17. Asked if they could switch their appt form 8:40 to 8:30 with arrival time of 8:20. Pt agreed. I advised of time, building and late policy. Pt acknowledged.

## 2017-11-20 DIAGNOSIS — H17822 Peripheral opacity of cornea, left eye: Secondary | ICD-10-CM | POA: Diagnosis not present

## 2017-11-20 DIAGNOSIS — H35372 Puckering of macula, left eye: Secondary | ICD-10-CM | POA: Diagnosis not present

## 2017-11-20 DIAGNOSIS — E119 Type 2 diabetes mellitus without complications: Secondary | ICD-10-CM | POA: Diagnosis not present

## 2017-11-20 DIAGNOSIS — H25813 Combined forms of age-related cataract, bilateral: Secondary | ICD-10-CM | POA: Diagnosis not present

## 2017-11-20 LAB — HM DIABETES EYE EXAM

## 2017-11-26 ENCOUNTER — Telehealth: Payer: Self-pay | Admitting: Family Medicine

## 2017-11-26 ENCOUNTER — Other Ambulatory Visit: Payer: Self-pay | Admitting: Family Medicine

## 2017-11-26 DIAGNOSIS — G8929 Other chronic pain: Secondary | ICD-10-CM

## 2017-11-26 DIAGNOSIS — M5416 Radiculopathy, lumbar region: Secondary | ICD-10-CM

## 2017-11-26 DIAGNOSIS — M545 Low back pain: Secondary | ICD-10-CM

## 2017-11-26 NOTE — Telephone Encounter (Signed)
Refill request.   Norco is requiring a diagnosis change due to a system change.  The Zanaflex has been pended.  PCP:  Dr. Brigitte Pulse

## 2017-11-26 NOTE — Telephone Encounter (Signed)
Copied from [place] [id number]. Topic: Quick Communication - Rx Refill/Question >> Nov 26, 2017  9:01 AM Virl Axe D wrote: Medication: HYDROcodone-acetaminophen (NORCO) 10-325 MG tablet / tiZANidine (ZANAFLEX) 4 MG tablet  Has the patient contacted their pharmacy? Yes.   (Agent: If no, request that the patient contact the pharmacy for the refill.) (Agent: If yes, when and what did the pharmacy advise?)  Preferred Pharmacy (with phone number or street name): [place], [place] [phone] (Phone) [phone] (Fax)    Agent: Please be advised that RX refills may take up to 3 business days. We ask that you follow-up with your pharmacy.

## 2017-11-26 NOTE — Telephone Encounter (Signed)
Requested medication (s) are due for refill today: Yes  Requested medication (s) are on the active medication list: Yes  Last refill:  07/07/17  Future visit scheduled: nO  Notes to clinic:  See request    Requested Prescriptions  Pending Prescriptions Disp Refills   tiZANidine (ZANAFLEX) 4 MG tablet [Pharmacy Med Name: TIZANIDINE  4MG   TAB] 90 tablet 0    Sig: TAKE 1 TABLET BY MOUTH  EVERY 8 HOURS AS NEEDED FOR MUSCLE SPASMS IN  BACK/LEGS/FEET.     Not Delegated - Cardiovascular:  Alpha-2 Agonists - tizanidine Failed - 11/26/2017  8:55 AM      Failed - This refill cannot be delegated      Passed - Valid encounter within last 6 months    Recent Outpatient Visits          4 months ago Type 2 diabetes mellitus with other circulatory complications [facility])   Primary Care at [facility], Laurey Arrow, MD   8 months ago Annual physical exam   [facility] Care at [place], Laurey Arrow, MD   11 months ago Acute right-sided low back pain without sciatica   [facility] at [place] [facility], Laurey Arrow, MD   11 months ago Flank pain   Primary Care at [place], [place], [place]   1 year ago Chronic pain of left lower extremity   [facility] at [facility], Laurey Arrow, MD      Future Appointments            In 2 weeks Shawnee Knapp, MD Primary Care at [place], [facility]         Signed Prescriptions Disp Refills   meloxicam (MOBIC) 7.5 MG tablet 90 tablet 0    Sig: TAKE 1 TABLET BY MOUTH  DAILY AS NEEDED FOR PAIN     Analgesics:  COX2 Inhibitors Failed - 11/26/2017  8:55 AM      Failed - HGB in normal range and within 360 days    Hemoglobin  Date Value Ref Range Status  10/24/2016 14.7 14.1 - 18.1 g/dL Final  10/24/2014 14.9 13.0 - 17.0 g/dL Final         Passed - Cr in normal range and within 360 days    Creat  Date Value Ref Range Status  08/17/2015 1.15 0.70 - 1.25 mg/dL Final    Comment:      For patients > or = 71 years of age: The upper reference limit for Creatinine is approximately 13% higher for  people identified as African-American.      Creatinine, Ser  Date Value Ref Range Status  07/07/2017 0.89 0.76 - 1.27 mg/dL Final         Passed - Patient is not pregnant      Passed - Valid encounter within last 12 months    Recent Outpatient Visits          4 months ago Type 2 diabetes mellitus with other circulatory complications [facility])   Primary Care at [facility], Laurey Arrow, MD   8 months ago Annual physical exam   [facility] Care at [place], Laurey Arrow, MD   11 months ago Acute right-sided low back pain without sciatica   [facility] Care at [place], Laurey Arrow, MD   11 months ago Flank pain   Primary Care at [place], [place], [place]   1 year ago Chronic pain of left lower extremity   [facility] at [facility], Laurey Arrow, MD  Future Appointments            In 2 weeks Shawnee Knapp, MD Primary Care at [place], [facility]          triamcinolone cream (KENALOG) 0.1 % 45 g 0    Sig: [place] A DAY     Dermatology:  Corticosteroids Passed - 11/26/2017  8:55 AM      Passed - Valid encounter within last 12 months    Recent Outpatient Visits          4 months ago Type 2 diabetes mellitus with other circulatory complications [facility])   Primary Care at [place], Laurey Arrow, MD   8 months ago Annual physical exam   [facility] Care at [place] [facility], Laurey Arrow, MD   11 months ago Acute right-sided low back pain without sciatica   [facility] at [place], Laurey Arrow, MD   11 months ago Flank pain   Primary Care at [place], [place], [place]   1 year ago Chronic pain of left lower extremity   Primary Care at [place], Laurey Arrow, MD      Future Appointments            In 2 weeks Shawnee Knapp, MD Primary Care at [place], [facility]          hydrochlorothiazide (HYDRODIURIL) 25 MG tablet 90 tablet 1    Sig: TAKE 1 TABLET BY MOUTH  DAILY     Cardiovascular: Diuretics - Thiazide Passed - 11/26/2017  8:55 AM      Passed - Ca in normal range and within 360 days    Calcium  Date Value  Ref Range Status  07/07/2017 9.8 8.6 - 10.2 mg/dL Final         Passed - Cr in normal range and within 360 days    Creat  Date Value Ref Range Status  08/17/2015 1.15 0.70 - 1.25 mg/dL Final    Comment:      For patients > or = 71 years of age: The upper reference limit for Creatinine is approximately 13% higher for people identified as African-American.      Creatinine, Ser  Date Value Ref Range Status  07/07/2017 0.89 0.76 - 1.27 mg/dL Final         Passed - K in normal range and within 360 days    Potassium  Date Value Ref Range Status  07/07/2017 4.2 3.5 - 5.2 mmol/L Final         Passed - Na in normal range and within 360 days    Sodium  Date Value Ref Range Status  07/07/2017 139 134 - 144 mmol/L Final         Passed - Last BP in normal range    BP Readings from Last 1 Encounters:  07/07/17 130/76         Passed - Valid encounter within last 6 months    Recent Outpatient Visits          4 months ago Type 2 diabetes mellitus with other circulatory complications [facility])   Primary Care at [place], Laurey Arrow, MD   8 months ago Annual physical exam   [facility] at [place] [facility], Laurey Arrow, MD   11 months ago Acute right-sided low back pain without sciatica   [facility] at [place] [facility], Laurey Arrow, MD   11 months ago Flank pain   Primary Care at [place], [place], [place]  1 year ago Chronic pain of left lower extremity   [facility] at [place], Laurey Arrow, MD      Future Appointments            In 2 weeks Shawnee Knapp, MD Primary Care at [place], [place]          amLODipine (NORVASC) 10 MG tablet 90 tablet 1    Sig: TAKE 1 TABLET BY MOUTH  DAILY     Cardiovascular:  Calcium Channel Blockers Passed - 11/26/2017  8:55 AM      Passed - Last BP in normal range    BP Readings from Last 1 Encounters:  07/07/17 130/76         Passed - Valid encounter within last 6 months    Recent Outpatient Visits          4 months ago Type 2 diabetes mellitus with other  circulatory complications [facility])   Primary Care at [place] [facility], Laurey Arrow, MD   8 months ago Annual physical exam   [facility] at [place], Laurey Arrow, MD   11 months ago Acute right-sided low back pain without sciatica   [facility] at [place] [facility], Laurey Arrow, MD   11 months ago Flank pain   Primary Care at [place], [place], [place]   1 year ago Chronic pain of left lower extremity   [facility] Care at [place], Laurey Arrow, MD      Future Appointments            In 2 weeks Shawnee Knapp, MD Primary Care at [place], [facility]

## 2017-11-29 NOTE — Telephone Encounter (Signed)
Pt message sent to Dr. Brigitte Pulse re: Norco rx dx

## 2017-11-30 NOTE — Telephone Encounter (Signed)
Refill for Zanaflex sent to Dr. Brigitte Pulse for approval

## 2017-12-08 MED ORDER — HYDROCODONE-ACETAMINOPHEN 10-325 MG PO TABS: 1.0000 | ORAL_TABLET | Freq: Four times a day (QID) | ORAL | 0 refills | Status: DC | PRN

## 2017-12-08 NOTE — Telephone Encounter (Signed)
Called to [facility] Rx diagnosis codes received for [employer]

## 2017-12-08 NOTE — Telephone Encounter (Signed)
Requested 90 zanaflex but just sent in 30 as concerns about sedation - 90 used in past 5 mos so likely just at night but will discuss w/ pt further at his OV 11/26.  Today I have utilized the [place] Controlled Substance Registry's online query to confirm compliance regarding the patient's controlled medications. My review reveals appropriate prescription fills and that I am the sole provider of these medications. Rechecks will occur regularly and the patient is aware of our use of the system.  He last received #60 hydrocodone on 6/17 from me and was filled by [facility] 7/2 - none since so sent in refills -used code of left lumbar radiculopathy M54.16 and chronic low back pain M54.5, G89.29.

## 2017-12-08 NOTE — Telephone Encounter (Signed)
PLEASE use code of left lumbar radiculopathy M54.16 and chronic low back pain M54.5, G89.29.  ------------------- Requested 90 zanaflex but just sent in 30 as concerns about sedation - 90 used in past 5 mos so likely just at night but will discuss w/ pt further at his OV 11/26.  Today I have utilized the [place] Controlled Substance Registry's online query to confirm compliance regarding the patient's controlled medications. My review reveals appropriate prescription fills and that I am the sole provider of these medications. Rechecks will occur regularly and the patient is aware of our use of the system.  He last received #60 hydrocodone on 6/17 from me and was filled by [facility] 7/2 - none since so sent in refills.

## 2017-12-08 NOTE — Telephone Encounter (Signed)
Not sure what this means

## 2017-12-08 NOTE — Telephone Encounter (Signed)
I did not order any pneumatic compression devices and am not sure why pt would need these - would need to be ordered by his orthopedist or vascular specialist.

## 2017-12-08 NOTE — Telephone Encounter (Signed)
No - if pt needs this, then needs to get it from his orthopedist to ensure he gets correct type and won't exacerbate loss of muscle strength.

## 2017-12-16 ENCOUNTER — Other Ambulatory Visit: Payer: Self-pay

## 2017-12-16 ENCOUNTER — Encounter: Payer: Self-pay | Admitting: Family Medicine

## 2017-12-16 ENCOUNTER — Ambulatory Visit (INDEPENDENT_AMBULATORY_CARE_PROVIDER_SITE_OTHER): Payer: Medicare Other | Admitting: Family Medicine

## 2017-12-16 VITALS — BP 148/80 | HR 75 | Temp 97.8°F | Ht 65.0 in | Wt 131.8 lb

## 2017-12-16 DIAGNOSIS — M545 Low back pain, unspecified: Secondary | ICD-10-CM

## 2017-12-16 DIAGNOSIS — I10 Essential (primary) hypertension: Secondary | ICD-10-CM

## 2017-12-16 DIAGNOSIS — M5431 Sciatica, right side: Secondary | ICD-10-CM | POA: Diagnosis not present

## 2017-12-16 DIAGNOSIS — E785 Hyperlipidemia, unspecified: Secondary | ICD-10-CM

## 2017-12-16 DIAGNOSIS — E1159 Type 2 diabetes mellitus with other circulatory complications: Secondary | ICD-10-CM | POA: Diagnosis not present

## 2017-12-16 LAB — POCT GLYCOSYLATED HEMOGLOBIN (HGB A1C): Hemoglobin A1C: 5.9 % — AB (ref 4.0–5.6)

## 2017-12-16 LAB — GLUCOSE, POCT (MANUAL RESULT ENTRY): POC GLUCOSE: 157 mg/dL — AB (ref 70–99)

## 2017-12-16 MED ORDER — PREDNISONE 20 MG PO TABS: ORAL_TABLET | ORAL | 0 refills | Status: DC

## 2017-12-16 MED FILL — predniSONE 20 MG TABS: 20 | 9 days supply | Qty: 18 | Fill #0

## 2017-12-16 NOTE — Progress Notes (Signed)
Subjective:    Patient: Alexander [name]  DOB: 06/24/1945; 72 y.o.   MRN: [id number]  Chief Complaint  Patient presents with  . right side pain    from buttocks down to leg    HPI On the right side started bothing him newly but used to be on the left side.  Can only sit for a few minutes before he has to get up and move around in order to get relief.  Started 3 weeks ago - nki - but still does work with a lot of bending and lifting.  Pain has been waxing/waining - was worst sev d ago but now worst w/ sitting and better with standing and laying.  Pain is more severe but perhaps some slight weakness. Occ feels numb. But starts in her lower right glut and goes all the way down to his toe.  He has been trying some of the hot patches that don't really help.  He occ puts an ace wrap around right calf which might help.  He does get when active with walking but not just located to calf. He occ takes an arthritis BP occ. He had requested a refill of the hydrocodone a week ago which he just picked up for this flair.  He takes 1 in a.m. And another in the evening before laying down. Not waking from sleep, no change in bowel/bladder, no f/c, no falls, tol po well.   When he had it on the left side it was constant throbbing all the time 24-7 but was relieved with gabapentin and hydrocodone.  Still taking gabapentin 1 in a.m. And taking 2 tabs qhs - doesn't make him groggy as long as he is moving but then makes him doze when he sits down.  He is taking a meloxicam every morning which he does think is helping some. Taking the tizanidine once a day in the morning which helps a little and does not make him to tired    Was referred to [facility] in 2014 but then rec to have surgery and during his pre-op w/u he was recommended to have cardiac cath which he did but that gave him serious concerns aobut undergoing surgery and so decided to defer. Couldn't afford PT.  Seen at [facility] and vascular last yr - referred  10/2016 -states they were going to decide I fthey were going to do the shots but could never get it together so then decided he was going to the surgery but then changed his mind. He would rather life with pain and treat the best he can with medicine than get cut  Medical History Past Medical History:  Diagnosis Date  . Cancer [facility])    prostate  . Diabetes mellitus without complication ([place])   . Hypertension   . Neuromuscular disorder ([place])   . Peripheral arterial disease ([place]) 11/14/2016   non-occlusive, asymptomatic, seen by vascular surgery Dr. Oneida Alar who rec repeated ABIs annually   Past Surgical History:  Procedure Laterality Date  . COLON SURGERY     "locked bowel" fixed  . HERNIA REPAIR    . LEFT HEART CATHETERIZATION WITH CORONARY ANGIOGRAM N/A 09/15/2013   Procedure: LEFT HEART CATHETERIZATION WITH CORONARY ANGIOGRAM;  Surgeon: Peter M Martinique, MD;  Location: [facility] CATH LAB;  Service: Cardiovascular;  Laterality: N/A;  . PROSTATE SURGERY  2005   Current Outpatient Medications on File Prior to Visit  Medication Sig Dispense Refill  . amLODipine (NORVASC) 10 MG tablet TAKE 1 TABLET BY  MOUTH  DAILY 90 tablet 1  . aspirin EC 81 MG tablet Take 81 mg by mouth daily.    Marland Kitchen atorvastatin (LIPITOR) 40 MG tablet TAKE 1 TABLET BY MOUTH  DAILY 90 tablet 1  . Blood Glucose Monitoring Suppl W/DEVICE KIT 1 each by Does not apply route daily. 100 each 3  . diphenhydrAMINE (BENADRYL) 25 MG tablet Take 25 mg by mouth every 6 (six) hours as needed.    . fluticasone (FLONASE) 50 MCG/ACT nasal spray Place 2 sprays into both nostrils daily. 16 g 6  . gabapentin (NEURONTIN) 300 MG capsule TAKE 3 CAPSULES BY MOUTH  TWO TIMES DAILY 540 capsule 1  . hydrochlorothiazide (HYDRODIURIL) 25 MG tablet TAKE 1 TABLET BY MOUTH  DAILY 90 tablet 1  . HYDROcodone-acetaminophen (NORCO) 10-325 MG tablet Take 1 tablet by mouth every 6 (six) hours as needed for moderate pain. Dx M54.5, M54.16, G89.29 60 tablet 0  .  meloxicam (MOBIC) 7.5 MG tablet TAKE 1 TABLET BY MOUTH  DAILY AS NEEDED FOR PAIN 90 tablet 0  . metFORMIN (GLUCOPHAGE) 1000 MG tablet Take 1 tablet (1,000 mg total) by mouth 2 (two) times daily with a meal. 180 tablet 3  . montelukast (SINGULAIR) 10 MG tablet TAKE 1 TABLET BY MOUTH AT  BEDTIME 90 tablet 1  . sildenafil (VIAGRA) 100 MG tablet Take 100 mg by mouth.    Marland Kitchen tiZANidine (ZANAFLEX) 4 MG tablet TAKE 1 TABLET BY MOUTH  EVERY 8 HOURS AS NEEDED FOR MUSCLE SPASMS IN  BACK/LEGS/FEET. 30 tablet 0  . triamcinolone cream (KENALOG) 0.1 % APPLY TOPICALLY TWO TIMES A DAY 45 g 0  . vitamin C (ASCORBIC ACID) 500 MG tablet Take 500 mg daily by mouth.     No current facility-administered medications on file prior to visit.    Allergies  Allergen Reactions  . Lisinopril Swelling    angioedema   Family History  Problem Relation Age of Onset  . Diabetes Mother   . Depression Father   . Hyperlipidemia Sister    Social History   Socioeconomic History  . Marital status: Widowed    Spouse name: Not on file  . Number of children: 2  . Years of education: 9  . Highest education level: Not on file  Occupational History  . Occupation: retired  Scientific laboratory technician  . Financial resource strain: Not on file  . Food insecurity:    Worry: Not on file    Inability: Not on file  . Transportation needs:    Medical: Not on file    Non-medical: Not on file  Tobacco Use  . Smoking status: Current Every Day Smoker    Packs/day: 1.00    Years: 55.00    Pack years: 55.00    Types: Cigarettes  . Smokeless tobacco: Never Used  Substance and Sexual Activity  . Alcohol use: No  . Drug use: No  . Sexual activity: Yes  Lifestyle  . Physical activity:    Days per week: Not on file    Minutes per session: Not on file  . Stress: Not on file  Relationships  . Social connections:    Talks on phone: Not on file    Gets together: Not on file    Attends religious service: Not on file    Active member of club  or organization: Not on file    Attends meetings of clubs or organizations: Not on file    Relationship status: Not on file  Other Topics  Concern  . Not on file  Social History Narrative   Patient is single and his daughter lives with him.   Patient has two children.   Patient has a 9th grade education.   Patient works in Architect (part-time).   Patient drinks one to two cups of coffee daily.   Patient is right-handed.         Depression screen [facility] 2/9 12/16/2017 07/07/2017 03/10/2017 12/11/2016 12/04/2016  Decreased Interest 0 0 0 0 0  Down, Depressed, Hopeless 0 0 0 0 0  PHQ - 2 Score 0 0 0 0 0    ROS As noted in HPI  Objective:  BP (!) 148/80   Pulse 75   Temp 97.8 F (36.6 C) (Oral)   Ht _0  (1.651 m)   Wt 131 lb 12.8 oz (59.8 kg)   SpO2 94%   BMI 21.93 kg/m  Physical Exam  Constitutional: He is oriented to person, place, and time. He appears well-developed and well-nourished. No distress.  HENT:  Head: Normocephalic and atraumatic.  Eyes: Pupils are equal, round, and reactive to light. Conjunctivae are normal. No scleral icterus.  Neck: Normal range of motion. Neck supple. No thyromegaly present.  Cardiovascular: Normal rate, regular rhythm, normal heart sounds and intact distal pulses.  Pulmonary/Chest: Effort normal and breath sounds normal. No respiratory distress.  Musculoskeletal: He exhibits no edema.       Lumbar back: He exhibits decreased range of motion. He exhibits no tenderness and no bony tenderness.  Mild ttp across right SI joint, no ttp across L SI joint or bilateral hip bursa. Neg straight leg raise bilaterally. Strength excellent with 5/5 bilaterally except for 4+/5 in bilateral plantar and dorsiflexion and 4++/5 in right hip flexor.   Lymphadenopathy:    He has no cervical adenopathy.  Neurological: He is alert and oriented to person, place, and time.  Skin: Skin is warm and dry. He is not diaphoretic.  Psychiatric: He has a normal mood and  affect. His behavior is normal.    [facility] TESTING Office Visit on 12/16/2017  Component Date Value Ref Range Status  . Hemoglobin A1C 12/16/2017 5.9* 4.0 - 5.6 % Final  . POC Glucose 12/16/2017 157* 70 - 99 mg/dl Final     Assessment & Plan:   1. Type 2 diabetes mellitus with other circulatory complications (HCC) - DM very well controlled, cont current regimen of metformin 1g bid  2. Sciatica of right side   3. Acute right-sided low back pain without sciatica - flair of long-standing issues - hopefully can improve w/ oral pred - cbgs well controlled enough that I think is reasonably safe to do. Hold meloxicam 7.5 while on prednisone can resume when complete.  Try increasing gabapentin and tizanidine more to the amount as prescribed or less as tolerated by sedation. Start home exercises If symptoms do not resolve after a course of Pred or at all worsening, will need l-spine MRI and to refer to orthospine or neurosurgery for further eval Okay to refill gabapentin whenever requested. Has hydrocodone at home I refilled number 68 days prior.  4.      Essential hypertension - well controlled on current regimen of amlodipine 10, hctz 25. Not on acei due to angiodedema w/ lisinopril. 5.       Hyperlipidemia LDL goal < 70: needs to be fasting at next OV - cont atrovastatin 40 Lab Results  Component Value Date   [facility] 43 07/07/2017    Patient will  continue on current chronic medications other than changes noted above, so ok to refill when needed.   See after visit summary for patient specific instructions.  Orders Placed This Encounter  Procedures  . Comprehensive metabolic panel  . Microalbumin / creatinine urine ratio  . POCT glycosylated hemoglobin (Hb A1C)  . POCT glucose (manual entry)    Meds ordered this encounter  Medications  . predniSONE (DELTASONE) 20 MG tablet    Sig: Take 3 tabs qd x 3d, then 2 tabs qd x 3d then 1 tab qd x 3d.    Dispense:  18 tablet    Refill:  0     Patient verbalized to me that they understand the following: diagnosis, what is being done for them, what to expect and what should be done at home.  Their questions have been answered. They understand that I am unable to predict every possible medication interaction or adverse outcome and that if any unexpected symptoms arise, they should contact us and their pharmacist, as well as never hesitate to seek urgent/emergent care at [facility] or ER if they think it might be warranted.    [name], MD, MPH Primary Care at [place] [address] [place], [place]  [address] [phone] Office phone  [phone] Office fax  12/16/17 8:25 AM

## 2017-12-16 NOTE — Patient Instructions (Addendum)
Start the prednisone this morning.  Take all 3 pills together in the morning for 3 days in a row, and take both 2 pills together in the morning for 3 days 0, then 1 pill in the morning for 3 days and stop.  I recommend increasing both your tizanidine/Zanaflex and gabapentin/Neurontin as tolerated to help with the pain.  I would also like you to start on the home exercises listed below.  Stop the meloxicam until after you are done with the prednisone.  In 9 days when the prednisone course is complete, then restart the meloxicam.  If you are still having any pain in 4 to 6 weeks or if it any point things are getting worse, please return to the office for further evaluation so we can discuss the possibility of a lumbar MRI and giving you to orthopedic surgery for injections before you develop permanent weakness or numbness in your left leg.    If you have lab work done today you will be contacted with your lab results within the next 2 weeks.  If you have not heard from [place] then please contact us. The fastest way to get your results is to register for My Chart.   IF you received an x-ray today, you will receive an invoice from [facility]. Please contact [facility] at [phone] with questions or concerns regarding your invoice.   IF you received labwork today, you will receive an invoice from [place]. Please contact [facility] at [phone] with questions or concerns regarding your invoice.   Our billing staff will not be able to assist you with questions regarding bills from these companies.  You will be contacted with the lab results as soon as they are available. The fastest way to get your results is to activate your My Chart account. Instructions are located on the last page of this paperwork. If you have not heard from [place] regarding the results in 2 weeks, please contact this office.     Sciatica Sciatica is pain, numbness, weakness, or tingling along the path of the sciatic  nerve. The sciatic nerve starts in the lower back and runs down the back of each leg. The nerve controls the muscles in the lower leg and in the back of the knee. It also provides feeling (sensation) to the back of the thigh, the lower leg, and the sole of the foot. Sciatica is a symptom of another medical condition that pinches or puts pressure on the sciatic nerve. Generally, sciatica only affects one side of the body. Sciatica usually goes away on its own or with treatment. In some cases, sciatica may keep coming back (recur). What are the causes? This condition is caused by pressure on the sciatic nerve, or pinching of the sciatic nerve. This may be the result of:  A disk in between the bones of the spine (vertebrae) bulging out too far (herniated disk).  Age-related changes in the spinal disks (degenerative disk disease).  A pain disorder that affects a muscle in the buttock (piriformis syndrome).  Extra bone growth (bone spur) near the sciatic nerve.  An injury or break (fracture) of the pelvis.  Pregnancy.  Tumor (rare).  What increases the risk? The following factors may make you more likely to develop this condition:  Playing sports that place pressure or stress on the spine, such as football or weight lifting.  Having poor strength and flexibility.  A history of back injury.  A history of back surgery.  Sitting for long  periods of time.  Doing activities that involve repetitive bending or lifting.  Obesity.  What are the signs or symptoms? Symptoms can vary from mild to very severe, and they may include:  Any of these problems in the lower back, leg, hip, or buttock: ? Mild tingling or dull aches. ? Burning sensations. ? Sharp pains.  Numbness in the back of the calf or the sole of the foot.  Leg weakness.  Severe back pain that makes movement difficult.  These symptoms may get worse when you cough, sneeze, or laugh, or when you sit or stand for long  periods of time. Being overweight may also make symptoms worse. In some cases, symptoms may recur over time. How is this diagnosed? This condition may be diagnosed based on:  Your symptoms.  A physical exam. Your health care provider may ask you to do certain movements to check whether those movements trigger your symptoms.  You may have tests, including: ? Blood tests. ? X-rays. ? MRI. ? CT scan.  How is this treated? In many cases, this condition improves on its own, without any treatment. However, treatment may include:  Reducing or modifying physical activity during periods of pain.  Exercising and stretching to strengthen your abdomen and improve the flexibility of your spine.  Icing and applying heat to the affected area.  Medicines that help: ? To relieve pain and swelling. ? To relax your muscles.  Injections of medicines that help to relieve pain, irritation, and inflammation around the sciatic nerve (steroids).  Surgery.  Follow these instructions at home: Medicines  Take over-the-counter and prescription medicines only as told by your health care provider.  Do not drive or operate heavy machinery while taking prescription pain medicine. Managing pain  If directed, apply ice to the affected area. ? Put ice in a plastic bag. ? Place a towel between your skin and the bag. ? Leave the ice on for 20 minutes, 2-3 times a day.  After icing, apply heat to the affected area before you exercise or as often as told by your health care provider. Use the heat source that your health care provider recommends, such as a moist heat pack or a heating pad. ? Place a towel between your skin and the heat source. ? Leave the heat on for 20-30 minutes. ? Remove the heat if your skin turns bright red. This is especially important if you are unable to feel pain, heat, or cold. You may have a greater risk of getting burned. Activity  Return to your normal activities as told by  your health care provider. Ask your health care provider what activities are safe for you. ? Avoid activities that make your symptoms worse.  Take brief periods of rest throughout the day. Resting in a lying or standing position is usually better than sitting to rest. ? When you rest for longer periods, mix in some mild activity or stretching between periods of rest. This will help to prevent stiffness and pain. ? Avoid sitting for long periods of time without moving. Get up and move around at least one time each hour.  Exercise and stretch regularly, as told by your health care provider.  Do not lift anything that is heavier than 10 lb (4.5 kg) while you have symptoms of sciatica. When you do not have symptoms, you should still avoid heavy lifting, especially repetitive heavy lifting.  When you lift objects, always use proper lifting technique, which includes: ? Bending your knees. ?  Keeping the load close to your body. ? Avoiding twisting. General instructions  Use good posture. ? Avoid leaning forward while sitting. ? Avoid hunching over while standing.  Maintain a healthy weight. Excess weight puts extra stress on your back and makes it difficult to maintain good posture.  Wear supportive, comfortable shoes. Avoid wearing high heels.  Avoid sleeping on a mattress that is too soft or too hard. A mattress that is firm enough to support your back when you sleep may help to reduce your pain.  Keep all follow-up visits as told by your health care provider. This is important. Contact a health care provider if:  You have pain that wakes you up when you are sleeping.  You have pain that gets worse when you lie down.  Your pain is worse than you have experienced in the past.  Your pain lasts longer than 4 weeks.  You experience unexplained weight loss. Get help right away if:  You lose control of your bowel or bladder (incontinence).  You have: ? Weakness in your lower back,  pelvis, buttocks, or legs that gets worse. ? Redness or swelling of your back. ? A burning sensation when you urinate. This information is not intended to replace advice given to you by your health care provider. Make sure you discuss any questions you have with your health care provider. Document Released: 01/01/2001 Document Revised: 06/13/2015 Document Reviewed: 09/16/2014 Elsevier Interactive Patient Education  2018 [employer].  Sciatica Rehab Ask your health care provider which exercises are safe for you. Do exercises exactly as told by your health care provider and adjust them as directed. It is normal to feel mild stretching, pulling, tightness, or discomfort as you do these exercises, but you should stop right away if you feel sudden pain or your pain gets worse.Do not begin these exercises until told by your health care provider. Stretching and range of motion exercises These exercises warm up your muscles and joints and improve the movement and flexibility of your hips and your back. These exercises also help to relieve pain, numbness, and tingling. Exercise A: Sciatic nerve glide 1. Sit in a chair with your head facing down toward your chest. Place your hands behind your back. Let your shoulders slump forward. 2. Slowly straighten one of your knees while you tilt your head back as if you are looking toward the ceiling. Only straighten your leg as far as you can without making your symptoms worse. 3. Hold for __________ seconds. 4. Slowly return to the starting position. 5. Repeat with your other leg. Repeat __________ times. Complete this exercise __________ times a day. Exercise B: Knee to chest with hip adduction and internal rotation  1. Lie on your back on a firm surface with both legs straight. 2. Bend one of your knees and move it up toward your chest until you feel a gentle stretch in your lower back and buttock. Then, move your knee toward the shoulder that is on the  opposite side from your leg. ? Hold your leg in this position by holding onto the front of your knee. 3. Hold for __________ seconds. 4. Slowly return to the starting position. 5. Repeat with your other leg. Repeat __________ times. Complete this exercise __________ times a day. Exercise C: Prone extension on elbows  1. Lie on your abdomen on a firm surface. A bed may be too soft for this exercise. 2. Prop yourself up on your elbows. 3. Use your arms to help  lift your chest up until you feel a gentle stretch in your abdomen and your lower back. ? This will place some of your body weight on your elbows. If this is uncomfortable, try stacking pillows under your chest. ? Your hips should stay down, against the surface that you are lying on. Keep your hip and back muscles relaxed. 4. Hold for __________ seconds. 5. Slowly relax your upper body and return to the starting position. Repeat __________ times. Complete this exercise __________ times a day. Strengthening exercises These exercises build strength and endurance in your back. Endurance is the ability to use your muscles for a long time, even after they get tired. Exercise D: Pelvic tilt 1. Lie on your back on a firm surface. Bend your knees and keep your feet flat. 2. Tense your abdominal muscles. Tip your pelvis up toward the ceiling and flatten your lower back into the floor. ? To help with this exercise, you may place a small towel under your lower back and try to push your back into the towel. 3. Hold for __________ seconds. 4. Let your muscles relax completely before you repeat this exercise. Repeat __________ times. Complete this exercise __________ times a day. Exercise E: Alternating arm and leg raises  1. Get on your hands and knees on a firm surface. If you are on a hard floor, you may want to use padding to cushion your knees, such as an exercise mat. 2. Line up your arms and legs. Your hands should be below your shoulders,  and your knees should be below your hips. 3. Lift your left leg behind you. At the same time, raise your right arm and straighten it in front of you. ? Do not lift your leg higher than your hip. ? Do not lift your arm higher than your shoulder. ? Keep your abdominal and back muscles tight. ? Keep your hips facing the ground. ? Do not arch your back. ? Keep your balance carefully, and do not hold your breath. 4. Hold for __________ seconds. 5. Slowly return to the starting position and repeat with your right leg and your left arm. Repeat __________ times. Complete this exercise __________ times a day. Posture and body mechanics  Body mechanics refers to the movements and positions of your body while you do your daily activities. Posture is part of body mechanics. Good posture and healthy body mechanics can help to relieve stress in your body's tissues and joints. Good posture means that your spine is in its natural S-curve position (your spine is neutral), your shoulders are pulled back slightly, and your head is not tipped forward. The following are general guidelines for applying improved posture and body mechanics to your everyday activities. Standing   When standing, keep your spine neutral and your feet about hip-width apart. Keep a slight bend in your knees. Your ears, shoulders, and hips should line up.  When you do a task in which you stand in one place for a long time, place one foot up on a stable object that is 2-4 inches (5-10 cm) high, such as a footstool. This helps keep your spine neutral. Sitting   When sitting, keep your spine neutral and keep your feet flat on the floor. Use a footrest, if necessary, and keep your thighs parallel to the floor. Avoid rounding your shoulders, and avoid tilting your head forward.  When working at a desk or a computer, keep your desk at a height where your hands are slightly lower  than your elbows. Slide your chair under your desk so you are  close enough to maintain good posture.  When working at a computer, place your monitor at a height where you are looking straight ahead and you do not have to tilt your head forward or downward to look at the screen. Resting   When lying down and resting, avoid positions that are most painful for you.  If you have pain with activities such as sitting, bending, stooping, or squatting (flexion-based activities), lie in a position in which your body does not bend very much. For example, avoid curling up on your side with your arms and knees near your chest (fetal position).  If you have pain with activities such as standing for a long time or reaching with your arms (extension-based activities), lie with your spine in a neutral position and bend your knees slightly. Try the following positions: ? Lying on your side with a pillow between your knees. ? Lying on your back with a pillow under your knees. Lifting   When lifting objects, keep your feet at least shoulder-width apart and tighten your abdominal muscles.  Bend your knees and hips and keep your spine neutral. It is important to lift using the strength of your legs, not your back. Do not lock your knees straight out.  Always ask for help to lift heavy or awkward objects. This information is not intended to replace advice given to you by your health care provider. Make sure you discuss any questions you have with your health care provider. Document Released: 01/07/2005 Document Revised: 09/14/2015 Document Reviewed: 09/23/2014 Elsevier Interactive Patient Education  [employer].

## 2017-12-17 LAB — COMPREHENSIVE METABOLIC PANEL
A/G RATIO: 1.7 (ref 1.2–2.2)
ALBUMIN: 4.5 g/dL (ref 3.5–4.8)
ALT: 15 IU/L (ref 0–44)
AST: 18 IU/L (ref 0–40)
Alkaline Phosphatase: 110 IU/L (ref 39–117)
BUN / CREAT RATIO: 21 (ref 10–24)
BUN: 23 mg/dL (ref 8–27)
Bilirubin Total: <0.2 mg/dL (ref 0.0–1.2)
CALCIUM: 10.4 mg/dL — AB (ref 8.6–10.2)
CHLORIDE: 100 mmol/L (ref 96–106)
CO2: 24 mmol/L (ref 20–29)
Creatinine, Ser: 1.09 mg/dL (ref 0.76–1.27)
GFR, EST AFRICAN AMERICAN: 79 mL/min/{1.73_m2} (ref >59)
GFR, EST NON AFRICAN AMERICAN: 68 mL/min/{1.73_m2} (ref >59)
GLOBULIN, TOTAL: 2.7 g/dL (ref 1.5–4.5)
Glucose: 148 mg/dL — ABNORMAL HIGH (ref 65–99)
POTASSIUM: 4.2 mmol/L (ref 3.5–5.2)
SODIUM: 139 mmol/L (ref 134–144)
TOTAL PROTEIN: 7.2 g/dL (ref 6.0–8.5)

## 2017-12-17 LAB — MICROALBUMIN / CREATININE URINE RATIO
Creatinine, Urine: 117.7 mg/dL
MICROALB/CREAT RATIO: 10.5 mg/g{creat} (ref 0.0–30.0)
Microalbumin, Urine: 12.4 ug/mL

## 2017-12-20 MED ORDER — ATORVASTATIN CALCIUM 40 MG PO TABS: 40.0000 mg | ORAL_TABLET | Freq: Every day | ORAL | 1 refills | Status: DC

## 2017-12-25 DIAGNOSIS — M48062 Spinal stenosis, lumbar region with neurogenic claudication: Secondary | ICD-10-CM | POA: Diagnosis not present

## 2017-12-26 ENCOUNTER — Telehealth: Payer: Self-pay | Admitting: Family Medicine

## 2017-12-26 NOTE — Telephone Encounter (Signed)
Copied from [place] [id number]. Topic: General - Other >> Dec 26, 2017 11:21 AM Lennox Solders wrote: Reason for CRM: pt daughter Aniceto Boss is calling and pt would like  a new rx libre free style meter. [facility] [phone]

## 2017-12-30 NOTE — Telephone Encounter (Signed)
Please advise 

## 2017-12-30 NOTE — Telephone Encounter (Signed)
Pt's daughter Aniceto Boss reached out again to find out the status of a new RX [facility] for pt. Please advise.  [place]

## 2018-01-02 NOTE — Telephone Encounter (Signed)
Alexander [name], pts daughter, called again to f/u on RX for [employer]. She states a week is long enough and asked who she needs to talk to in order to resolve this. I advise her that it is sent as an urgent message to Dr. Brigitte Pulse and waiting for her approval. She states she will call Monday if no resolve to speak with office manager.  [place], [place] - [address] [phone] (Phone) [phone] (Fax)

## 2018-01-06 ENCOUNTER — Other Ambulatory Visit: Payer: Self-pay | Admitting: *Deleted

## 2018-01-06 DIAGNOSIS — E1159 Type 2 diabetes mellitus with other circulatory complications: Secondary | ICD-10-CM

## 2018-01-06 MED ORDER — FREESTYLE LIBRE 14 DAY READER DEVI: 1.0000 "application " | 1 refills | Status: DC

## 2018-01-06 MED ORDER — FREESTYLE LIBRE 14 DAY SENSOR MISC: 1.0000 | 1 refills | Status: DC

## 2018-01-06 NOTE — Telephone Encounter (Signed)
Prescription was sent to [facility]

## 2018-01-06 NOTE — Telephone Encounter (Signed)
Pt's daughter called to check on this.  Per [facility] assistant will get with Dr. Brigitte Pulse today for resolution.   Aniceto Boss would like a call back when decision has been made.

## 2018-01-07 DIAGNOSIS — I10 Essential (primary) hypertension: Secondary | ICD-10-CM | POA: Diagnosis not present

## 2018-01-07 DIAGNOSIS — M48062 Spinal stenosis, lumbar region with neurogenic claudication: Secondary | ICD-10-CM | POA: Diagnosis not present

## 2018-01-08 ENCOUNTER — Telehealth: Payer: Self-pay | Admitting: Family Medicine

## 2018-01-08 NOTE — Telephone Encounter (Signed)
Called and started PA for [employer] 14 day sensor. Results will be faxed to [phone].

## 2018-01-15 ENCOUNTER — Telehealth: Payer: Self-pay | Admitting: Family Medicine

## 2018-01-15 NOTE — Telephone Encounter (Signed)
Freestyle Libre 14 day Sensor has been rejected for Prior Auth..  Please advise.  Thank you!

## 2018-01-20 NOTE — Telephone Encounter (Signed)
Please let pt knwo

## 2018-01-22 DIAGNOSIS — E119 Type 2 diabetes mellitus without complications: Secondary | ICD-10-CM | POA: Diagnosis not present

## 2018-01-22 DIAGNOSIS — M48062 Spinal stenosis, lumbar region with neurogenic claudication: Secondary | ICD-10-CM | POA: Diagnosis not present

## 2018-01-30 DIAGNOSIS — M48062 Spinal stenosis, lumbar region with neurogenic claudication: Secondary | ICD-10-CM | POA: Diagnosis not present

## 2018-02-04 ENCOUNTER — Ambulatory Visit: Payer: Self-pay | Admitting: *Deleted

## 2018-02-04 NOTE — Telephone Encounter (Signed)
Pt called with complaints of not being able to sleep due to his feet swelling, cramping, and tingling for about a month; he says that this only happens at night when he is trying to sleep; the pt said he got shots in has back due to sciatica; recommendations made per nurse triage protocol; the pt normally sees Dr Brigitte Pulse but she has no availability; pt offered and accepted appointment with Dr Grant Fontana, [place] [address], 02/05/2018 at 0820; he verbalized understanding.  Reason for Disposition . [1] MILD swelling of both ankles (i.e., pedal edema) AND [2] new onset or worsening  Answer Assessment - Initial Assessment Questions 1. ONSET: "When did the swelling start?" (e.g., minutes, hours, days)     About a month ago 01/04/18 2. LOCATION: "What part of the leg is swollen?"  "Are both legs swollen or just one leg?"     bil feet 3. SEVERITY: "How bad is the swelling?" (e.g., localized; mild, moderate, severe)  - Localized - small area of swelling localized to one leg  - MILD pedal edema - swelling limited to foot and ankle, pitting edema < 1/4 inch (6 mm) deep, rest and elevation eliminate most or all swelling  - MODERATE edema - swelling of lower leg to knee, pitting edema > 1/4 inch (6 mm) deep, rest and elevation only partially reduce swelling  - SEVERE edema - swelling extends above knee, facial or hand swelling present      mild 4. REDNESS: "Does the swelling look red or infected?"     no 5. PAIN: "Is the swelling painful to touch?" If so, ask: "How painful is it?"   (Scale 1-10; mild, moderate or severe)     Foot cramps rated 10 out of 10 6. FEVER: "Do you have a fever?" If so, ask: "What is it, how was it measured, and when did it start?"      no 7. CAUSE: "What do you think is causing the leg swelling?"     Not sure 8. MEDICAL HISTORY: "Do you have a history of heart failure, kidney disease, liver failure, or cancer?"     no 9. RECURRENT SYMPTOM: "Have you had leg swelling before?" If  so, ask: "When was the last time?" "What happened that time?"     Yes intermittently 10. OTHER SYMPTOMS: "Do you have any other symptoms?" (e.g., chest pain, difficulty breathing)       Cramping, and tingling in both feet 11. PREGNANCY: "Is there any chance you are pregnant?" "When was your last menstrual period?"       n/a  Protocols used: LEG SWELLING AND EDEMA-A-AH

## 2018-02-05 ENCOUNTER — Ambulatory Visit (INDEPENDENT_AMBULATORY_CARE_PROVIDER_SITE_OTHER): Payer: Medicare Other | Admitting: Family Medicine

## 2018-02-05 ENCOUNTER — Encounter: Payer: Self-pay | Admitting: Family Medicine

## 2018-02-05 ENCOUNTER — Other Ambulatory Visit: Payer: Self-pay

## 2018-02-05 VITALS — BP 134/85 | HR 82 | Temp 98.7°F | Ht 65.0 in | Wt 132.2 lb

## 2018-02-05 DIAGNOSIS — I10 Essential (primary) hypertension: Secondary | ICD-10-CM

## 2018-02-05 DIAGNOSIS — M5416 Radiculopathy, lumbar region: Secondary | ICD-10-CM

## 2018-02-05 MED ORDER — TIZANIDINE HCL 4 MG PO TABS: ORAL_TABLET | ORAL | 0 refills | Status: DC

## 2018-02-05 MED ORDER — HYDROCODONE-ACETAMINOPHEN 10-325 MG PO TABS: 1.0000 | ORAL_TABLET | Freq: Four times a day (QID) | ORAL | 0 refills | Status: DC | PRN

## 2018-02-05 MED FILL — tiZANidine HCL 4 MG TABS: 4 | 10 days supply | Qty: 30 | Fill #0

## 2018-02-05 MED FILL — HYDROCODON-APAP 10-325: 10-325 | 15 days supply | Qty: 60 | Fill #0

## 2018-02-05 NOTE — Progress Notes (Signed)
1/16/20208:22 AM  Alexander [name] [place] [birth year], 73 y.o. male [id number]  Chief Complaint  Patient presents with  . Pain    Having pain in the the legs radiating down to the toes along with cramping and tingling for the past 2 days. Taking  bc powder, nueroontin and norco for the pain. BC powder works best for the pain. Had shots in his back 2 wks ago for pinched nerves. Does not think that has nothing to do with the pain he's feeling today  . Medication Refill    norco and nuerontin    HPI:   Patient is a 73 y.o. male with past medical history significant for DM2, HTN, DDD lumbar spine, PAD, prostate cancer who presents today for BLE pain  PCP Dr Brigitte Pulse pmp reviewed last norco refilled Dec 08 2017 #60  Reports having BLE pain throbbing, cramps, tingling, no burning, worse at night Left worse than Right Pain radiates down to his toes Denies any falls, focal weakness, changes in bowel or bladder function Had had series of epidural injections within the past 2 weeks [facility] Since then pain is slowly getting better, however last night it was very intense, unable to sleep Takes gabapentin, has been on 645m BID for about 6 months, does not have any side effects, used to be on 9058mBID Needs refill of hydrocodone BC powder helps better than hydrocodone Has not been taking tizanadine Denies h/o DM neuropathy Declines flu and pneumo vaccines  Lab Results  Component Value Date   NA 139 12/16/2017   K 4.2 12/16/2017   CL 100 12/16/2017   CO2 24 12/16/2017   Lab Results  Component Value Date   CALCIUM 10.4 (H) 12/16/2017    Lab Results  Component Value Date   HGBA1C 5.9 (A) 12/16/2017   HGBA1C 6.9 (A) 07/07/2017   HGBA1C 6.5 03/10/2017   Lab Results  Component Value Date   MICROALBUR 3.2 04/20/2015   LDLCALC 43 07/07/2017   CREATININE 1.09 12/16/2017    Fall Risk  02/05/2018 12/16/2017 07/07/2017 03/10/2017 12/11/2016  Falls in the past year? 0 0 No No  No  Number falls in past yr: - - - - -  Injury with Fall? - - - - -     [facility] 02/05/2018 12/16/2017 07/07/2017  Decreased Interest 0 0 0  Down, Depressed, Hopeless 0 0 0  PHQ - 2 Score 0 0 0    Allergies  Allergen Reactions  . Lisinopril Swelling    angioedema    Prior to Admission medications   Medication Sig Start Date End Date Taking? Authorizing Provider  amLODipine (NORVASC) 10 MG tablet TAKE 1 TABLET BY MOUTH  DAILY 11/26/17  Yes ShShawnee KnappMD  aspirin EC 81 MG tablet Take 81 mg by mouth daily.   Yes [provider]  atorvastatin (LIPITOR) 40 MG tablet Take 1 tablet (40 mg total) by mouth daily. 12/20/17  Yes ShShawnee KnappMD  Blood Glucose Monitoring Suppl W/DEVICE KIT 1 each by Does not apply route daily. 12/09/13  Yes ShShawnee KnappMD  Continuous Blood Gluc Receiver (FREESTYLE LIBRE 14 DAY READER) DEVI 1 application by Does not apply route every 14 (fourteen) days. E11.9   Use as directed 01/06/18  Yes ShShawnee KnappMD  Continuous Blood Gluc Sensor (FREESTYLE LIBRE 14 DAY SENSOR) MISC 1 application by Does not apply route every 14 (fourteen) days. Use as directed  E11.9 01/06/18  Yes Shawnee Knapp, MD  diphenhydrAMINE (BENADRYL) 25 MG tablet Take 25 mg by mouth every 6 (six) hours as needed.   Yes [provider]  fluticasone (FLONASE) 50 MCG/ACT nasal spray Place 2 sprays into both nostrils daily. 07/07/17  Yes Shawnee Knapp, MD  gabapentin (NEURONTIN) 300 MG capsule TAKE 3 CAPSULES BY MOUTH  TWO TIMES DAILY 05/30/17  Yes Shawnee Knapp, MD  hydrochlorothiazide (HYDRODIURIL) 25 MG tablet TAKE 1 TABLET BY MOUTH  DAILY 11/26/17  Yes Shawnee Knapp, MD  HYDROcodone-acetaminophen (NORCO) 10-325 MG tablet Take 1 tablet by mouth every 6 (six) hours as needed for moderate pain. Dx M54.5, M54.16, G89.29 12/08/17  Yes Shawnee Knapp, MD  meloxicam (MOBIC) 7.5 MG tablet TAKE 1 TABLET BY MOUTH  DAILY AS NEEDED FOR PAIN 11/26/17  Yes Shawnee Knapp, MD  metFORMIN  (GLUCOPHAGE) 1000 MG tablet Take 1 tablet (1,000 mg total) by mouth 2 (two) times daily with a meal. 07/07/17  Yes Shawnee Knapp, MD  montelukast (SINGULAIR) 10 MG tablet TAKE 1 TABLET BY MOUTH AT  BEDTIME 10/29/17  Yes Shawnee Knapp, MD  predniSONE (DELTASONE) 20 MG tablet Take 3 tabs qd x 3d, then 2 tabs qd x 3d then 1 tab qd x 3d. 12/16/17  Yes Shawnee Knapp, MD  sildenafil (VIAGRA) 100 MG tablet Take 100 mg by mouth.   Yes [provider]  tiZANidine (ZANAFLEX) 4 MG tablet TAKE 1 TABLET BY MOUTH  EVERY 8 HOURS AS NEEDED FOR MUSCLE SPASMS IN  BACK/LEGS/FEET. 12/08/17  Yes Shawnee Knapp, MD  triamcinolone cream (KENALOG) 0.1 % APPLY TOPICALLY TWO TIMES A DAY 11/26/17  Yes Shawnee Knapp, MD  vitamin C (ASCORBIC ACID) 500 MG tablet Take 500 mg daily by mouth.   Yes [provider]    Past Medical History:  Diagnosis Date  . Cancer [facility])    prostate  . Diabetes mellitus without complication ([place])   . Hypertension   . Neuromuscular disorder ([place])   . Peripheral arterial disease ([place]) 11/14/2016   non-occlusive, asymptomatic, seen by vascular surgery Dr. Oneida Alar who rec repeated ABIs annually    Past Surgical History:  Procedure Laterality Date  . COLON SURGERY     "locked bowel" fixed  . HERNIA REPAIR    . LEFT HEART CATHETERIZATION WITH CORONARY ANGIOGRAM N/A 09/15/2013   Procedure: LEFT HEART CATHETERIZATION WITH CORONARY ANGIOGRAM;  Surgeon: Peter M Martinique, MD;  Location: [facility] CATH LAB;  Service: Cardiovascular;  Laterality: N/A;  . PROSTATE SURGERY  2005    Social History   Tobacco Use  . Smoking status: Current Every Day Smoker    Packs/day: 1.00    Years: 55.00    Pack years: 55.00    Types: Cigarettes  . Smokeless tobacco: Never Used  Substance Use Topics  . Alcohol use: No    Family History  Problem Relation Age of Onset  . Diabetes Mother   . Depression Father   . Hyperlipidemia Sister     ROS Per hpi  OBJECTIVE:  Blood pressure 134/85, pulse 82,  temperature 98.7 F (37.1 C), temperature source Oral, height _0  (1.651 m), weight 132 lb 3.2 oz (60 kg), SpO2 96 %. Body mass index is 22 kg/m.   Wt Readings from Last 3 Encounters:  02/05/18 132 lb 3.2 oz (60 kg)  12/16/17 131 lb 12.8 oz (59.8 kg)  07/07/17 126 lb 9.6 oz (57.4 kg)    Physical Exam Vitals  signs and nursing note reviewed.  Constitutional:      Appearance: He is well-developed.  HENT:     Head: Normocephalic and atraumatic.  Eyes:     Conjunctiva/sclera: Conjunctivae normal.     Pupils: Pupils are equal, round, and reactive to light.  Neck:     Musculoskeletal: Neck supple.  Pulmonary:     Effort: Pulmonary effort is normal.  Skin:    General: Skin is warm and dry.  Neurological:     Mental Status: He is alert and oriented to person, place, and time.     Motor: Motor function is intact.     Gait: Gait is intact.     Deep Tendon Reflexes: Reflexes are normal and symmetric.     Comments: Neg SLR     ASSESSMENT and PLAN  1. Lumbar radiculopathy, chronic Patient overall doing better after injections. Refilling hydrocodone and tizanadine which he has taken in the past during flare ups. Reviewed new meds r/se/b.  - HYDROcodone-acetaminophen (NORCO) 10-325 MG tablet; Take 1 tablet by mouth every 6 (six) hours as needed for moderate pain. Dx M54.5, M54.16, G89.29  2. Hypercalcemia Rechecking today - Comprehensive metabolic panel  3. Essential hypertension Controlled. Continue current regime.  Other orders - tiZANidine (ZANAFLEX) 4 MG tablet; TAKE 1 TABLET BY MOUTH  EVERY 8 HOURS AS NEEDED FOR MUSCLE SPASMS IN  BACK/LEGS/FEET.  Return in about 4 months (around 06/06/2018) for DM and HTN.    Rutherford Guys, MD Primary Care at [place], [place] [address] Ph.  [phone] Fax [phone]

## 2018-02-05 NOTE — Patient Instructions (Signed)
° ° ° °  If you have lab work done today you will be contacted with your lab results within the next 2 weeks.  If you have not heard from us then please contact us. The fastest way to get your results is to register for My Chart. ° ° °IF you received an x-ray today, you will receive an invoice from [facility]. Please contact [facility] at [phone] with questions or concerns regarding your invoice.  ° °IF you received labwork today, you will receive an invoice from [facility]. Please contact [facility] at [phone] with questions or concerns regarding your invoice.  ° °Our billing staff will not be able to assist you with questions regarding bills from these companies. ° °You will be contacted with the lab results as soon as they are available. The fastest way to get your results is to activate your My Chart account. Instructions are located on the last page of this paperwork. If you have not heard from us regarding the results in 2 weeks, please contact this office. °  ° ° ° °

## 2018-02-06 LAB — COMPREHENSIVE METABOLIC PANEL
ALT: 12 IU/L (ref 0–44)
AST: 13 IU/L (ref 0–40)
Albumin/Globulin Ratio: 1.5 (ref 1.2–2.2)
Albumin: 4.4 g/dL (ref 3.5–4.8)
Alkaline Phosphatase: 124 IU/L — ABNORMAL HIGH (ref 39–117)
BUN/Creatinine Ratio: 16 (ref 10–24)
BUN: 15 mg/dL (ref 8–27)
Bilirubin Total: 0.3 mg/dL (ref 0.0–1.2)
CO2: 21 mmol/L (ref 20–29)
Calcium: 9.8 mg/dL (ref 8.6–10.2)
Chloride: 101 mmol/L (ref 96–106)
Creatinine, Ser: 0.94 mg/dL (ref 0.76–1.27)
GFR calc Af Amer: 94 mL/min/{1.73_m2} (ref >59)
GFR calc non Af Amer: 81 mL/min/{1.73_m2} (ref >59)
Globulin, Total: 3 g/dL (ref 1.5–4.5)
Glucose: 146 mg/dL — ABNORMAL HIGH (ref 65–99)
Potassium: 4.1 mmol/L (ref 3.5–5.2)
Sodium: 142 mmol/L (ref 134–144)
Total Protein: 7.4 g/dL (ref 6.0–8.5)

## 2018-04-22 ENCOUNTER — Other Ambulatory Visit: Payer: Self-pay | Admitting: Emergency Medicine

## 2018-04-22 DIAGNOSIS — E1159 Type 2 diabetes mellitus with other circulatory complications: Secondary | ICD-10-CM

## 2018-04-22 DIAGNOSIS — E785 Hyperlipidemia, unspecified: Secondary | ICD-10-CM

## 2018-04-22 DIAGNOSIS — I10 Essential (primary) hypertension: Secondary | ICD-10-CM

## 2018-04-27 ENCOUNTER — Other Ambulatory Visit: Payer: Self-pay

## 2018-04-27 ENCOUNTER — Ambulatory Visit (INDEPENDENT_AMBULATORY_CARE_PROVIDER_SITE_OTHER): Payer: Medicare Other | Admitting: Family Medicine

## 2018-04-27 DIAGNOSIS — E1159 Type 2 diabetes mellitus with other circulatory complications: Secondary | ICD-10-CM

## 2018-04-27 DIAGNOSIS — I10 Essential (primary) hypertension: Secondary | ICD-10-CM | POA: Diagnosis not present

## 2018-04-27 DIAGNOSIS — E785 Hyperlipidemia, unspecified: Secondary | ICD-10-CM | POA: Diagnosis not present

## 2018-04-28 LAB — COMPREHENSIVE METABOLIC PANEL
ALT: 12 IU/L (ref 0–44)
AST: 20 IU/L (ref 0–40)
Albumin/Globulin Ratio: 1.5 (ref 1.2–2.2)
Albumin: 4.4 g/dL (ref 3.7–4.7)
Alkaline Phosphatase: 123 IU/L — ABNORMAL HIGH (ref 39–117)
BUN/Creatinine Ratio: 14 (ref 10–24)
BUN: 14 mg/dL (ref 8–27)
Bilirubin Total: 0.4 mg/dL (ref 0.0–1.2)
CO2: 22 mmol/L (ref 20–29)
Calcium: 9.8 mg/dL (ref 8.6–10.2)
Chloride: 100 mmol/L (ref 96–106)
Creatinine, Ser: 0.98 mg/dL (ref 0.76–1.27)
GFR calc Af Amer: 89 mL/min/{1.73_m2} (ref >59)
GFR calc non Af Amer: 77 mL/min/{1.73_m2} (ref >59)
Globulin, Total: 2.9 g/dL (ref 1.5–4.5)
Glucose: 141 mg/dL — ABNORMAL HIGH (ref 65–99)
Potassium: 4.1 mmol/L (ref 3.5–5.2)
Sodium: 139 mmol/L (ref 134–144)
Total Protein: 7.3 g/dL (ref 6.0–8.5)

## 2018-04-28 LAB — HEMOGLOBIN A1C
Est. average glucose Bld gHb Est-mCnc: 157 mg/dL
Hgb A1c MFr Bld: 7.1 % — ABNORMAL HIGH (ref 4.8–5.6)

## 2018-04-28 LAB — LIPID PANEL
Chol/HDL Ratio: 2.8 ratio (ref 0.0–5.0)
Cholesterol, Total: 109 mg/dL (ref 100–199)
HDL: 39 mg/dL — ABNORMAL LOW (ref >39)
LDL Calculated: 57 mg/dL (ref 0–99)
Triglycerides: 63 mg/dL (ref 0–149)
VLDL Cholesterol Cal: 13 mg/dL (ref 5–40)

## 2018-04-30 ENCOUNTER — Telehealth (INDEPENDENT_AMBULATORY_CARE_PROVIDER_SITE_OTHER): Payer: Medicare Other | Admitting: Family Medicine

## 2018-04-30 ENCOUNTER — Other Ambulatory Visit: Payer: Self-pay

## 2018-04-30 DIAGNOSIS — Z8546 Personal history of malignant neoplasm of prostate: Secondary | ICD-10-CM

## 2018-04-30 DIAGNOSIS — R6889 Other general symptoms and signs: Secondary | ICD-10-CM | POA: Diagnosis not present

## 2018-04-30 DIAGNOSIS — E1159 Type 2 diabetes mellitus with other circulatory complications: Secondary | ICD-10-CM

## 2018-04-30 DIAGNOSIS — I10 Essential (primary) hypertension: Secondary | ICD-10-CM

## 2018-04-30 DIAGNOSIS — E785 Hyperlipidemia, unspecified: Secondary | ICD-10-CM

## 2018-04-30 NOTE — Progress Notes (Signed)
Chief Complaint :  4 MONTH F/U on DIABETES AND HTN  PLEASE CALL [phone]

## 2018-04-30 NOTE — Progress Notes (Signed)
Virtual Visit via telephone Note  I connected with patient on 04/30/18 at 1102am by telephone and verified that I am speaking with the correct person using two identifiers. Alexander [name] is currently located at work and patient is currently with her during visit. The provider, Rutherford Guys, MD is located in their office at time of visit.  I discussed the limitations, risks, security and privacy concerns of performing an evaluation and management service by telephone and the availability of in person appointments. I also discussed with the patient that there may be a patient responsible charge related to this service. The patient expressed understanding and agreed to proceed.  Telephone visit today for routine visit for DM2 and HTN  HPI 73yo M with PMH DM2, HTN, lumbar radiculopathy on opiates, PAD and prostate cancer ?  Last OV Jan 2020 for flare up back pain He has been doing well since then Does not check cbgs He does check BP occasionally, reports ok Takes metformin 1027m BID Denies any low sugars His only concern today is that he is having cold intolerance, denies an fatigue, denies any bowel changes, denies any mood changes Does not need refills at this time  BP Readings from Last 3 Encounters:  02/05/18 134/85  12/16/17 (!) 148/80  07/07/17 130/76    Wt Readings from Last 3 Encounters:  02/05/18 132 lb 3.2 oz (60 kg)  12/16/17 131 lb 12.8 oz (59.8 kg)  07/07/17 126 lb 9.6 oz (57.4 kg)    Lab Results  Component Value Date   HGBA1C 7.1 (H) 04/27/2018   HGBA1C 5.9 (A) 12/16/2017   HGBA1C 6.9 (A) 07/07/2017   Lab Results  Component Value Date   MICROALBUR 3.2 04/20/2015   LDLCALC 57 04/27/2018   CREATININE 0.98 04/27/2018    Fall Risk  04/30/2018 02/05/2018 12/16/2017 07/07/2017 03/10/2017  Falls in the past year? 0 0 0 No No  Number falls in past yr: 0 - - - -  Injury with Fall? 0 - - - -     [facility] 04/30/2018 02/05/2018 12/16/2017   Decreased Interest 0 0 0  Down, Depressed, Hopeless 0 0 0  PHQ - 2 Score 0 0 0    Allergies  Allergen Reactions  . Lisinopril Swelling    angioedema    Prior to Admission medications   Medication Sig Start Date End Date Taking? Authorizing Provider  amLODipine (NORVASC) 10 MG tablet TAKE 1 TABLET BY MOUTH  DAILY 11/26/17  Yes SShawnee Knapp MD  aspirin EC 81 MG tablet Take 81 mg by mouth daily.   Yes [provider]  atorvastatin (LIPITOR) 40 MG tablet Take 1 tablet (40 mg total) by mouth daily. 12/20/17  Yes SShawnee Knapp MD  Blood Glucose Monitoring Suppl W/DEVICE KIT 1 each by Does not apply route daily. 12/09/13  Yes SShawnee Knapp MD  Continuous Blood Gluc Receiver (FREESTYLE LIBRE 14 DAY READER) DEVI 1 application by Does not apply route every 14 (fourteen) days. E11.9   Use as directed 01/06/18  Yes SShawnee Knapp MD  Continuous Blood Gluc Sensor (FREESTYLE LIBRE 14 DAY SENSOR) MISC 1 application by Does not apply route every 14 (fourteen) days. Use as directed  E11.9 01/06/18  Yes SShawnee Knapp MD  diphenhydrAMINE (BENADRYL) 25 MG tablet Take 25 mg by mouth every 6 (six) hours as needed.   Yes [provider]  fluticasone (FLONASE) 50 MCG/ACT nasal spray Place 2 sprays into both  nostrils daily. 07/07/17  Yes Shawnee Knapp, MD  gabapentin (NEURONTIN) 300 MG capsule TAKE 3 CAPSULES BY MOUTH  TWO TIMES DAILY 05/30/17  Yes Shawnee Knapp, MD  hydrochlorothiazide (HYDRODIURIL) 25 MG tablet TAKE 1 TABLET BY MOUTH  DAILY 11/26/17  Yes Shawnee Knapp, MD  HYDROcodone-acetaminophen (NORCO) 10-325 MG tablet Take 1 tablet by mouth every 6 (six) hours as needed for moderate pain. Dx M54.5, M54.16, G89.29 02/05/18  Yes Rutherford Guys, MD  meloxicam (MOBIC) 7.5 MG tablet TAKE 1 TABLET BY MOUTH  DAILY AS NEEDED FOR PAIN 11/26/17  Yes Shawnee Knapp, MD  metFORMIN (GLUCOPHAGE) 1000 MG tablet Take 1 tablet (1,000 mg total) by mouth 2 (two) times daily with a meal. 07/07/17  Yes Shawnee Knapp, MD   montelukast (SINGULAIR) 10 MG tablet TAKE 1 TABLET BY MOUTH AT  BEDTIME 10/29/17  Yes Shawnee Knapp, MD  predniSONE (DELTASONE) 20 MG tablet Take 3 tabs qd x 3d, then 2 tabs qd x 3d then 1 tab qd x 3d. 12/16/17  Yes Shawnee Knapp, MD  sildenafil (VIAGRA) 100 MG tablet Take 100 mg by mouth.   Yes [provider]  tiZANidine (ZANAFLEX) 4 MG tablet TAKE 1 TABLET BY MOUTH  EVERY 8 HOURS AS NEEDED FOR MUSCLE SPASMS IN  BACK/LEGS/FEET. 02/05/18  Yes Rutherford Guys, MD  triamcinolone cream (KENALOG) 0.1 % APPLY TOPICALLY TWO TIMES A DAY 11/26/17  Yes Shawnee Knapp, MD  vitamin C (ASCORBIC ACID) 500 MG tablet Take 500 mg daily by mouth.   Yes [provider]    Past Medical History:  Diagnosis Date  . Cancer [facility])    prostate  . Diabetes mellitus without complication ([place])   . Hypertension   . Neuromuscular disorder ([place])   . Peripheral arterial disease ([place]) 11/14/2016   non-occlusive, asymptomatic, seen by vascular surgery Dr. Oneida Alar who rec repeated ABIs annually    Past Surgical History:  Procedure Laterality Date  . COLON SURGERY     "locked bowel" fixed  . HERNIA REPAIR    . LEFT HEART CATHETERIZATION WITH CORONARY ANGIOGRAM N/A 09/15/2013   Procedure: LEFT HEART CATHETERIZATION WITH CORONARY ANGIOGRAM;  Surgeon: Peter M Martinique, MD;  Location[facility] CATH LAB;  Service: Cardiovascular;  Laterality: N/A;  . PROSTATE SURGERY  2005    Social History   Tobacco Use  . Smoking status: Current Every Day Smoker    Packs/day: 1.00    Years: 55.00    Pack years: 55.00    Types: Cigarettes  . Smokeless tobacco: Never Used  Substance Use Topics  . Alcohol use: No    Family History  Problem Relation Age of Onset  . Diabetes Mother   . Depression Father   . Hyperlipidemia Sister     ROS Per hpi  Objective  Vitals as reported by the patient: none  There were no vitals filed for this visit.  ASSESSMENT and PLAN  1. Cold intolerance - TSH; Future  2. Hyperlipidemia  LDL goal <70 Controlled. Continue current regime.   3. Type 2 diabetes mellitus with other circulatory complications (HCC) Controlled. Despite higher a1c still at goal. Continue current regime.   4. Essential hypertension Controlled. Continue current regime.  - CBC; Future  5. History of prostate cancer - PSA; Future  FOLLOW-UP: next week for labs. 3 months for routine visit.    The above assessment and management plan was discussed with the patient. The patient verbalized understanding of and  has agreed to the management plan. Patient is aware to call the clinic if symptoms persist or worsen. Patient is aware when to return to the clinic for a follow-up visit. Patient educated on when it is appropriate to go to the emergency department.    I provided 12 minutes of non-face-to-face time during this encounter.  Rutherford Guys, MD Primary Care at [place], [place] [address] Ph.  [phone] Fax [phone]

## 2018-05-05 ENCOUNTER — Other Ambulatory Visit: Payer: Self-pay | Admitting: Family Medicine

## 2018-05-06 ENCOUNTER — Other Ambulatory Visit: Payer: Self-pay

## 2018-05-06 ENCOUNTER — Ambulatory Visit (INDEPENDENT_AMBULATORY_CARE_PROVIDER_SITE_OTHER): Payer: Medicare Other | Admitting: Family Medicine

## 2018-05-06 DIAGNOSIS — R6889 Other general symptoms and signs: Secondary | ICD-10-CM

## 2018-05-06 DIAGNOSIS — I10 Essential (primary) hypertension: Secondary | ICD-10-CM | POA: Diagnosis not present

## 2018-05-06 DIAGNOSIS — Z8546 Personal history of malignant neoplasm of prostate: Secondary | ICD-10-CM | POA: Diagnosis not present

## 2018-05-07 ENCOUNTER — Ambulatory Visit: Payer: Medicare Other | Admitting: Family Medicine

## 2018-05-07 LAB — CBC
Hematocrit: 40 % (ref 37.5–51.0)
Hemoglobin: 13.4 g/dL (ref 13.0–17.7)
MCH: 29 pg (ref 26.6–33.0)
MCHC: 33.5 g/dL (ref 31.5–35.7)
MCV: 87 fL (ref 79–97)
Platelets: 283 10*3/uL (ref 150–450)
RBC: 4.62 x10E6/uL (ref 4.14–5.80)
RDW: 13.8 % (ref 11.6–15.4)
WBC: 10.3 10*3/uL (ref 3.4–10.8)

## 2018-05-07 LAB — PSA: Prostate Specific Ag, Serum: 1.4 ng/mL (ref 0.0–4.0)

## 2018-05-07 LAB — TSH: TSH: 1.75 u[IU]/mL (ref 0.450–4.500)

## 2018-06-11 ENCOUNTER — Telehealth: Payer: Self-pay | Admitting: Family Medicine

## 2018-06-11 DIAGNOSIS — M5416 Radiculopathy, lumbar region: Secondary | ICD-10-CM

## 2018-06-11 MED ORDER — HYDROCODONE-ACETAMINOPHEN 10-325 MG PO TABS: 1.0000 | ORAL_TABLET | Freq: Four times a day (QID) | ORAL | 0 refills | Status: DC | PRN

## 2018-06-11 NOTE — Telephone Encounter (Signed)
Pt is requesting norco, last given in January

## 2018-06-11 NOTE — Telephone Encounter (Signed)
pmp reviewed  Med refilled 

## 2018-06-11 NOTE — Telephone Encounter (Signed)
Copied from [place] [id number]. Topic: Quick Communication - Rx Refill/Question >> Jun 11, 2018  2:27 PM Nils Flack, Marland Kitchen wrote: Medication: HYDROcodone-acetaminophen (NORCO) 10-325 MG tablet Has the patient contacted their pharmacy? Yes.   (Agent: If no, request that the patient contact the pharmacy for the refill.) (Agent: If yes, when and what did the pharmacy advise?)  Preferred Pharmacy (with phone number [facility] street name): [facility]   Agent: Please be advised that RX refills may take up to 3 business days. We ask that you follow-up with your pharmacy.

## 2018-06-12 ENCOUNTER — Telehealth: Payer: Self-pay | Admitting: Family Medicine

## 2018-06-12 DIAGNOSIS — M5416 Radiculopathy, lumbar region: Secondary | ICD-10-CM

## 2018-06-12 NOTE — Telephone Encounter (Signed)
Copied from [place] [id number]. Topic: Quick Communication - Rx Refill/Question >> Jun 12, 2018  3:58 PM Nils Flack, Marland Kitchen wrote: Medication: HYDROcodone-acetaminophen (NORCO) 10-325 MG tablet  Has the patient contacted their pharmacy? Yes.   (Agent: If no, request that the patient contact the pharmacy for the refill.) (Agent: If yes, when and what did the pharmacy advise?)  Preferred Pharmacy (with phone number or street name): [facility]   [facility] - they are closed please send to [facility] C[facility] is [phone]   Agent: Please be advised that RX refills may take up to 3 business days. We ask that you follow-up with your pharmacy.

## 2018-06-16 NOTE — Telephone Encounter (Signed)
Please see note below. 

## 2018-06-17 MED ORDER — HYDROCODONE-ACETAMINOPHEN 10-325 MG PO TABS: 1.0000 | ORAL_TABLET | Freq: Four times a day (QID) | ORAL | 0 refills | Status: DC | PRN

## 2018-06-17 NOTE — Telephone Encounter (Signed)
pmp reviewed  Med refilled 

## 2018-06-18 MED FILL — HYDROCODON-APAP 10-325: 10-325 | 7 days supply | Qty: 28 | Fill #0

## 2018-06-24 ENCOUNTER — Other Ambulatory Visit: Payer: Self-pay | Admitting: Family Medicine

## 2018-06-24 DIAGNOSIS — M5416 Radiculopathy, lumbar region: Secondary | ICD-10-CM

## 2018-06-24 MED ORDER — HYDROCODONE-ACETAMINOPHEN 10-325 MG PO TABS: 1.0000 | ORAL_TABLET | Freq: Four times a day (QID) | ORAL | 0 refills | Status: DC | PRN

## 2018-06-24 MED FILL — HYDROCODON-APAP 10-325: 10-325 | 5 days supply | Qty: 20 | Fill #0

## 2018-06-24 NOTE — Progress Notes (Signed)
Insurance approves only 5 day supply as prn medication New rx sent

## 2018-06-26 ENCOUNTER — Other Ambulatory Visit: Payer: Self-pay | Admitting: Acute Care

## 2018-06-26 DIAGNOSIS — F17210 Nicotine dependence, cigarettes, uncomplicated: Secondary | ICD-10-CM

## 2018-06-26 DIAGNOSIS — Z122 Encounter for screening for malignant neoplasm of respiratory organs: Secondary | ICD-10-CM

## 2018-06-26 DIAGNOSIS — Z87891 Personal history of nicotine dependence: Secondary | ICD-10-CM

## 2018-07-01 ENCOUNTER — Telehealth: Payer: Self-pay | Admitting: Family Medicine

## 2018-07-01 ENCOUNTER — Other Ambulatory Visit: Payer: Self-pay | Admitting: Emergency Medicine

## 2018-07-01 DIAGNOSIS — M25561 Pain in right knee: Secondary | ICD-10-CM | POA: Diagnosis not present

## 2018-07-01 NOTE — Telephone Encounter (Signed)
Patient returned call and was informed since Alexander [name] rx will not longer cover his pain med he need to let us know which pharmacy he will like for [place] to call his med's into. Patient stated he will call ins company to see what pharm cover a 90 day supple of med and give [place] a call back once he obtain this information.

## 2018-07-01 NOTE — Telephone Encounter (Signed)
Patient said the he was getting Hydrocodone through [facility] and they told he they could not fill that prescription anymore  He would like a phone call back on what needs to be done

## 2018-07-01 NOTE — Telephone Encounter (Signed)
Called pt, lm mesg needing new pharmacy info since the current pharmacy will no longer fill the controlled med he is taking

## 2018-07-09 DIAGNOSIS — M25561 Pain in right knee: Secondary | ICD-10-CM | POA: Diagnosis not present

## 2018-07-17 DIAGNOSIS — M25561 Pain in right knee: Secondary | ICD-10-CM | POA: Diagnosis not present

## 2018-07-23 ENCOUNTER — Telehealth: Payer: Self-pay | Admitting: Acute Care

## 2018-07-23 DIAGNOSIS — M25561 Pain in right knee: Secondary | ICD-10-CM | POA: Diagnosis not present

## 2018-07-23 MED FILL — HYDROCODON-APAP 10-325: 10-325 | 5 days supply | Qty: 20 | Fill #0

## 2018-07-25 DIAGNOSIS — T783XXA Angioneurotic edema, initial encounter: Secondary | ICD-10-CM | POA: Diagnosis not present

## 2018-07-25 DIAGNOSIS — I10 Essential (primary) hypertension: Secondary | ICD-10-CM | POA: Diagnosis not present

## 2018-07-25 DIAGNOSIS — Z7984 Long term (current) use of oral hypoglycemic drugs: Secondary | ICD-10-CM | POA: Diagnosis not present

## 2018-07-25 DIAGNOSIS — E119 Type 2 diabetes mellitus without complications: Secondary | ICD-10-CM | POA: Diagnosis not present

## 2018-07-27 ENCOUNTER — Other Ambulatory Visit: Payer: Self-pay | Admitting: Family Medicine

## 2018-07-27 ENCOUNTER — Ambulatory Visit: Payer: Medicare Other | Admitting: Family Medicine

## 2018-07-27 ENCOUNTER — Telehealth: Payer: Self-pay

## 2018-07-27 NOTE — Telephone Encounter (Signed)
LMTC x 1  

## 2018-07-27 NOTE — Telephone Encounter (Signed)
Called pt, lm for surgical clearance appt. Forms were given back to Dr. Pamella Pert

## 2018-07-30 NOTE — Telephone Encounter (Signed)
Langley Gauss has been out of the office. Once she returns, she will call pt back.

## 2018-07-31 ENCOUNTER — Ambulatory Visit (INDEPENDENT_AMBULATORY_CARE_PROVIDER_SITE_OTHER): Payer: Medicare Other | Admitting: Family Medicine

## 2018-07-31 ENCOUNTER — Encounter: Payer: Self-pay | Admitting: Family Medicine

## 2018-07-31 ENCOUNTER — Other Ambulatory Visit: Payer: Self-pay

## 2018-07-31 VITALS — BP 146/78 | HR 79 | Temp 98.5°F | Ht 65.0 in | Wt 132.0 lb

## 2018-07-31 DIAGNOSIS — I779 Disorder of arteries and arterioles, unspecified: Secondary | ICD-10-CM

## 2018-07-31 DIAGNOSIS — E1159 Type 2 diabetes mellitus with other circulatory complications: Secondary | ICD-10-CM | POA: Diagnosis not present

## 2018-07-31 DIAGNOSIS — G8929 Other chronic pain: Secondary | ICD-10-CM | POA: Diagnosis not present

## 2018-07-31 DIAGNOSIS — I2510 Atherosclerotic heart disease of native coronary artery without angina pectoris: Secondary | ICD-10-CM

## 2018-07-31 DIAGNOSIS — M25561 Pain in right knee: Secondary | ICD-10-CM

## 2018-07-31 DIAGNOSIS — I10 Essential (primary) hypertension: Secondary | ICD-10-CM

## 2018-07-31 DIAGNOSIS — E785 Hyperlipidemia, unspecified: Secondary | ICD-10-CM | POA: Diagnosis not present

## 2018-07-31 DIAGNOSIS — Z8546 Personal history of malignant neoplasm of prostate: Secondary | ICD-10-CM

## 2018-07-31 DIAGNOSIS — Z01818 Encounter for other preprocedural examination: Secondary | ICD-10-CM | POA: Diagnosis not present

## 2018-07-31 NOTE — Telephone Encounter (Signed)
LMOM for pt - Rodena Piety had tried to call pt with Ct appt info.  She has mailed an appt reminder to pt.  Instructed pt to call our office if he has not received the letter or if any other questions.

## 2018-07-31 NOTE — Patient Instructions (Signed)
° ° ° °  If you have lab work done today you will be contacted with your lab results within the next 2 weeks.  If you have not heard from us then please contact us. The fastest way to get your results is to register for My Chart. ° ° °IF you received an x-ray today, you will receive an invoice from [facility]. Please contact [facility] at [phone] with questions or concerns regarding your invoice.  ° °IF you received labwork today, you will receive an invoice from [facility]. Please contact [facility] at [phone] with questions or concerns regarding your invoice.  ° °Our billing staff will not be able to assist you with questions regarding bills from these companies. ° °You will be contacted with the lab results as soon as they are available. The fastest way to get your results is to activate your My Chart account. Instructions are located on the last page of this paperwork. If you have not heard from us regarding the results in 2 weeks, please contact this office. °  ° ° ° °

## 2018-07-31 NOTE — Progress Notes (Signed)
7/10/20203:15 PM  Alexander [name] [birth year], 73 y.o., male [id number]  Chief Complaint  Patient presents with  . Medical Clearance    pt is scheduled to have surgery on the leg, duaghter is asking for a allergy panel for and also a referral for allergist due to lip swelling  . Diabetes  . Hypertension    HPI:   Patient is a 73 y.o. 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  Has not seen pulm yet Has appt for low dose CT scan next week and pulm clearance for surgery  Seen in ER July 4th for angioedema of lower lip Completes prednisone tonight This has happened multiple times Has no idea of trigger Would like to see allergist  Has had ongoing issues with urinary leakage since his surgery ~ 2008 for prostate cancer Has not seen urology for years PSA April 2020 1.4, however it was 0.6 in Feb 2019  Lab Results  Component Value Date   HGBA1C 7.1 (H) 04/27/2018   HGBA1C 5.9 (A) 12/16/2017   HGBA1C 6.9 (A) 07/07/2017   Lab Results  Component Value Date   MICROALBUR 3.2 04/20/2015   LDLCALC 57 04/27/2018   CREATININE 0.98 04/27/2018     Depression screen PHQ 2/9 07/31/2018 04/30/2018 02/05/2018  Decreased Interest 0 0 0  Down, Depressed, Hopeless 0 0 0  PHQ - 2 Score 0 0 0    Fall Risk  07/31/2018 04/30/2018 02/05/2018 12/16/2017 07/07/2017  Falls in the past year? 0 0 0 0 No  Number falls in past yr: 0 0 - - -  Injury with Fall? 0 0 - - -     Allergies  Allergen Reactions  . Lisinopril  Swelling    angioedema    Prior to Admission medications   Medication Sig Start Date End Date Taking? Authorizing Provider  amLODipine (NORVASC) 10 MG tablet TAKE 1 TABLET BY MOUTH  DAILY 11/26/17  Yes Shawnee Knapp, MD  aspirin EC 81 MG tablet Take 81 mg by mouth daily.   Yes [provider]  atorvastatin (LIPITOR) 40 MG tablet Take 1 tablet (40 mg total) by mouth daily. 12/20/17  Yes Shawnee Knapp, MD  Continuous Blood Gluc Sensor (FREESTYLE LIBRE 14 DAY SENSOR) MISC 1 application by Does not apply route every 14 (fourteen) days. Use as directed  E11.9 01/06/18  Yes Shawnee Knapp, MD  diphenhydrAMINE (BENADRYL) 25 MG tablet Take 25 mg by mouth every 6 (six) hours as needed.   Yes [provider]  fluticasone (FLONASE) 50 MCG/ACT nasal spray Place 2 sprays into both nostrils daily. 07/07/17  Yes Shawnee Knapp, MD  gabapentin (NEURONTIN) 300 MG capsule TAKE 3 CAPSULES BY MOUTH  TWO TIMES DAILY 05/05/18  Yes Shawnee Knapp, MD  hydrochlorothiazide (HYDRODIURIL) 25 MG tablet TAKE 1 TABLET BY MOUTH  DAILY 11/26/17  Yes Shawnee Knapp, MD  HYDROcodone-acetaminophen (NORCO) 10-325 MG tablet Take 1 tablet by mouth every 6 (six) hours as needed for moderate pain. Dx M54.5, M54.16, G89.29  06/24/18  Yes Rutherford Guys, MD  meloxicam (MOBIC) 7.5 MG tablet TAKE 1 TABLET BY MOUTH  DAILY AS NEEDED FOR PAIN 05/05/18  Yes Shawnee Knapp, MD  metFORMIN (GLUCOPHAGE) 1000 MG tablet Take 1 tablet (1,000 mg total) by mouth 2 (two) times daily with a meal. 07/07/17  Yes Shawnee Knapp, MD  montelukast (SINGULAIR) 10 MG tablet TAKE 1 TABLET BY MOUTH AT  BEDTIME 05/05/18  Yes Shawnee Knapp, MD  predniSONE (DELTASONE) 20 MG tablet Take 3 tabs qd x 3d, then 2 tabs qd x 3d then 1 tab qd x 3d. 12/16/17  Yes Shawnee Knapp, MD  sildenafil (VIAGRA) 100 MG tablet Take 100 mg by mouth.   Yes [provider]  tiZANidine (ZANAFLEX) 4 MG tablet TAKE 1 TABLET BY MOUTH  EVERY 8 HOURS AS NEEDED FOR MUSCLE SPASMS IN  BACK/LEGS/FEET.  02/05/18  Yes Rutherford Guys, MD  triamcinolone cream (KENALOG) 0.1 % APPLY TOPICALLY TWO TIMES A DAY 11/26/17  Yes Shawnee Knapp, MD  vitamin C (ASCORBIC ACID) 500 MG tablet Take 500 mg daily by mouth.   Yes [provider]    Past Medical History:  Diagnosis Date  . Cancer [facility])    prostate  . Diabetes mellitus without complication ([place])   . Hypertension   . Neuromuscular disorder ([place])   . Peripheral arterial disease ([place]) 11/14/2016   non-occlusive, asymptomatic, seen by vascular surgery Dr. Oneida Alar who rec repeated ABIs annually    Past Surgical History:  Procedure Laterality Date  . COLON SURGERY     "locked bowel" fixed  . HERNIA REPAIR    . LEFT HEART CATHETERIZATION WITH CORONARY ANGIOGRAM N/A 09/15/2013   Procedure: LEFT HEART CATHETERIZATION WITH CORONARY ANGIOGRAM;  Surgeon: Peter M Martinique, MD;  Location: [facility] CATH LAB;  Service: Cardiovascular;  Laterality: N/A;  . PROSTATE SURGERY  2005    Social History   Tobacco Use  . Smoking status: Current Every Day Smoker    Packs/day: 1.00    Years: 55.00    Pack years: 55.00    Types: Cigarettes  . Smokeless tobacco: Never Used  Substance Use Topics  . Alcohol use: No    Family History  Problem Relation Age of Onset  . Diabetes Mother   . Depression Father   . Hyperlipidemia Sister     Review of Systems  Constitutional: Negative for chills and fever.  Respiratory: Negative for cough and shortness of breath.   Cardiovascular: Negative for chest pain, palpitations, orthopnea, claudication, leg swelling and PND.  Gastrointestinal: Negative for abdominal pain, nausea and vomiting.  Genitourinary: Positive for frequency and urgency. Negative for dysuria and hematuria.  All other systems reviewed and are negative.    OBJECTIVE:  Today's Vitals   07/31/18 1507  BP: (!) 146/78  Pulse: 79  Temp: 98.5 F (36.9 C)  TempSrc: Oral  SpO2: 94%  Weight: 132 lb (59.9 kg)  Height: 5\' 5"  (1.651 m)   Body mass  index is 21.97 kg/m.  BP Readings from Last 3 Encounters:  07/31/18 (!) 146/78  02/05/18 134/85  12/16/17 (!) 148/80    Physical Exam Vitals signs and nursing note reviewed.  Constitutional:      Appearance: He is well-developed.  HENT:     Head: Normocephalic and atraumatic.     Right Ear: Hearing, tympanic membrane, ear canal and external ear normal.     Left Ear: Hearing, tympanic membrane, ear canal and external ear normal.  Mouth/Throat:     Pharynx: No oropharyngeal exudate.  Eyes:     Conjunctiva/sclera: Conjunctivae normal.     Pupils: Pupils are equal, round, and reactive to light.  Neck:     Musculoskeletal: Neck supple.     Thyroid: No thyromegaly.  Cardiovascular:     Rate and Rhythm: Normal rate and regular rhythm.     Pulses: Normal pulses.     Heart sounds: Normal heart sounds. No murmur. No friction rub. No gallop.   Pulmonary:     Effort: Pulmonary effort is normal.     Breath sounds: Normal breath sounds. No wheezing, rhonchi or rales.  Abdominal:     General: Bowel sounds are normal. There is no distension.     Palpations: Abdomen is soft. There is no mass.     Tenderness: There is no abdominal tenderness.  Musculoskeletal: Normal range of motion.     Right lower leg: No edema.     Left lower leg: No edema.  Lymphadenopathy:     Cervical: No cervical adenopathy.  Skin:    General: Skin is warm and dry.  Neurological:     Mental Status: He is alert and oriented to person, place, and time.     Cranial Nerves: No cranial nerve deficit.     Coordination: Coordination normal.     Gait: Gait normal.     Deep Tendon Reflexes: Reflexes are normal and symmetric.  Psychiatric:        Mood and Affect: Mood normal.    My interpretation of EKG:  NSR, HR 80, negative T waves anterior leads, unchanged compared to Feb 2019  Results for orders placed or performed in visit on 07/31/18 (from the past 48 hour(s))  Lipid panel     Status: None   Collection  Time: 07/31/18  4:22 PM  Result Value Ref Range   Cholesterol, Total 100 100 - 199 mg/dL   Triglycerides 79 0 - 149 mg/dL   HDL 41 >39 mg/dL   VLDL Cholesterol Cal 16 5 - 40 mg/dL   LDL Calculated 43 0 - 99 mg/dL   Chol/HDL Ratio 2.4 0.0 - 5.0 ratio    Comment:                                   T. Chol/HDL Ratio                                             Men  Women                               1/2 Avg.Risk  3.4    3.3                                   Avg.Risk  5.0    4.4                                2X Avg.Risk  9.6    7.1  3X Avg.Risk 23.4   11.0   CBC     Status: Abnormal   Collection Time: 07/31/18  4:22 PM  Result Value Ref Range   WBC 9.5 3.4 - 10.8 x10E3/uL   RBC 4.32 4.14 - 5.80 x10E6/uL   Hemoglobin 12.7 (L) 13.0 - 17.7 g/dL   Hematocrit 38.2 37.5 - 51.0 %   MCV 88 79 - 97 fL   MCH 29.4 26.6 - 33.0 pg   MCHC 33.2 31.5 - 35.7 g/dL   RDW 14.5 11.6 - 15.4 %   Platelets 245 150 - 450 x10E3/uL  Hemoglobin A1c     Status: Abnormal   Collection Time: 07/31/18  4:22 PM  Result Value Ref Range   Hgb A1c MFr Bld 7.4 (H) 4.8 - 5.6 %    Comment:          Prediabetes: 5.7 - 6.4          Diabetes: >6.4          Glycemic control for adults with diabetes: <7.0    Est. average glucose Bld gHb Est-mCnc 166 mg/dL  Comprehensive metabolic panel     Status: Abnormal   Collection Time: 07/31/18  4:22 PM  Result Value Ref Range   Glucose 111 (H) 65 - 99 mg/dL   BUN 15 8 - 27 mg/dL   Creatinine, Ser 1.10 0.76 - 1.27 mg/dL   GFR calc non Af Amer 67 >59 mL/min/1.73   GFR calc Af Amer 77 >59 mL/min/1.73   BUN/Creatinine Ratio 14 10 - 24   Sodium 143 134 - 144 mmol/L   Potassium 3.6 3.5 - 5.2 mmol/L   Chloride 106 96 - 106 mmol/L   CO2 20 20 - 29 mmol/L   Calcium 9.6 8.6 - 10.2 mg/dL   Total Protein 6.9 6.0 - 8.5 g/dL   Albumin 4.3 3.7 - 4.7 g/dL   Globulin, Total 2.6 1.5 - 4.5 g/dL   Albumin/Globulin Ratio 1.7 1.2 - 2.2   Bilirubin Total 0.2 0.0 -  1.2 mg/dL   Alkaline Phosphatase 93 39 - 117 IU/L   AST 15 0 - 40 IU/L   ALT 9 0 - 44 IU/L     ASSESSMENT and PLAN  1. Pre-op evaluation 2. Acute pain of right knee Labs and EKG reviewed. Patient with good mets and negative cardiac ROS.  Otherwise medically and cardiac optimized for upcoming surgery. Has upcoming appt with pulmonology for clearance. - CBC - EKG 12-Lead - Comprehensive metabolic panel  3. Essential hypertension Slightly above goal in setting of recent oral prednisone. Recheck at next [place] order/instruction:  4. Hyperlipidemia LDL goal <70 Controlled. Continue current regime.  - Lipid panel  5. Type 2 diabetes mellitus with other circulatory complications (HCC) Above goal. Adding low dose glipizide. Cont metformin. - Hemoglobin A1c  6. Coronary artery disease involving native coronary artery of native heart without angina pectoris Asymptomatic. Patient pre-contemplative re smoking. LDL at goal. Cont ASA.   7. History of prostate cancer - Ambulatory referral to Urology  8. Peripheral arterial occlusive disease (HCC) Asymptomatic. Patient pre-contemplative re smoking. Cont statin and ASA.  Other orders - glipiZIDE (GLUCOTROL) 5 MG tablet; Take 0.5 tablets (2.5 mg total) by mouth daily before breakfast.  Return in about 3 months (around 10/31/2018).    Rutherford Guys, MD Primary Care at [place],  [address] Ph.  [phone] Fax [phone]

## 2018-08-01 ENCOUNTER — Encounter: Payer: Self-pay | Admitting: Family Medicine

## 2018-08-01 LAB — COMPREHENSIVE METABOLIC PANEL
ALT: 9 IU/L (ref 0–44)
AST: 15 IU/L (ref 0–40)
Albumin/Globulin Ratio: 1.7 (ref 1.2–2.2)
Albumin: 4.3 g/dL (ref 3.7–4.7)
Alkaline Phosphatase: 93 IU/L (ref 39–117)
BUN/Creatinine Ratio: 14 (ref 10–24)
BUN: 15 mg/dL (ref 8–27)
Bilirubin Total: 0.2 mg/dL (ref 0.0–1.2)
CO2: 20 mmol/L (ref 20–29)
Calcium: 9.6 mg/dL (ref 8.6–10.2)
Chloride: 106 mmol/L (ref 96–106)
Creatinine, Ser: 1.1 mg/dL (ref 0.76–1.27)
GFR calc Af Amer: 77 mL/min/{1.73_m2} (ref >59)
GFR calc non Af Amer: 67 mL/min/{1.73_m2} (ref >59)
Globulin, Total: 2.6 g/dL (ref 1.5–4.5)
Glucose: 111 mg/dL — ABNORMAL HIGH (ref 65–99)
Potassium: 3.6 mmol/L (ref 3.5–5.2)
Sodium: 143 mmol/L (ref 134–144)
Total Protein: 6.9 g/dL (ref 6.0–8.5)

## 2018-08-01 LAB — CBC
Hematocrit: 38.2 % (ref 37.5–51.0)
Hemoglobin: 12.7 g/dL — ABNORMAL LOW (ref 13.0–17.7)
MCH: 29.4 pg (ref 26.6–33.0)
MCHC: 33.2 g/dL (ref 31.5–35.7)
MCV: 88 fL (ref 79–97)
Platelets: 245 10*3/uL (ref 150–450)
RBC: 4.32 x10E6/uL (ref 4.14–5.80)
RDW: 14.5 % (ref 11.6–15.4)
WBC: 9.5 10*3/uL (ref 3.4–10.8)

## 2018-08-01 LAB — LIPID PANEL
Chol/HDL Ratio: 2.4 ratio (ref 0.0–5.0)
Cholesterol, Total: 100 mg/dL (ref 100–199)
HDL: 41 mg/dL (ref >39)
LDL Calculated: 43 mg/dL (ref 0–99)
Triglycerides: 79 mg/dL (ref 0–149)
VLDL Cholesterol Cal: 16 mg/dL (ref 5–40)

## 2018-08-01 LAB — HEMOGLOBIN A1C
Est. average glucose Bld gHb Est-mCnc: 166 mg/dL
Hgb A1c MFr Bld: 7.4 % — ABNORMAL HIGH (ref 4.8–5.6)

## 2018-08-01 MED ORDER — GLIPIZIDE 5 MG PO TABS: 2.5000 mg | ORAL_TABLET | Freq: Every day | ORAL | 1 refills | Status: DC

## 2018-08-03 ENCOUNTER — Telehealth: Payer: Self-pay

## 2018-08-03 MED FILL — glipiZIDE 5 MG TABS: 5 | 90 days supply | Qty: 45 | Fill #0

## 2018-08-03 NOTE — Telephone Encounter (Signed)
Information needed for pt surgery was faxed to [facility] for pt surgery

## 2018-08-05 ENCOUNTER — Encounter: Payer: Self-pay | Admitting: Family Medicine

## 2018-08-06 ENCOUNTER — Other Ambulatory Visit: Payer: Self-pay

## 2018-08-06 ENCOUNTER — Ambulatory Visit (INDEPENDENT_AMBULATORY_CARE_PROVIDER_SITE_OTHER)
Admission: RE | Admit: 2018-08-06 | Discharge: 2018-08-06 | Disposition: A | Discharge status: Home / Self Care | Payer: Medicare Other | Source: Ambulatory Visit | Attending: Acute Care | Admitting: Acute Care

## 2018-08-06 DIAGNOSIS — F17210 Nicotine dependence, cigarettes, uncomplicated: Secondary | ICD-10-CM | POA: Diagnosis not present

## 2018-08-06 DIAGNOSIS — Z122 Encounter for screening for malignant neoplasm of respiratory organs: Secondary | ICD-10-CM

## 2018-08-06 DIAGNOSIS — Z87891 Personal history of nicotine dependence: Secondary | ICD-10-CM

## 2018-08-10 ENCOUNTER — Encounter: Payer: Self-pay | Admitting: Allergy and Immunology

## 2018-08-10 ENCOUNTER — Other Ambulatory Visit: Payer: Self-pay

## 2018-08-10 ENCOUNTER — Ambulatory Visit (INDEPENDENT_AMBULATORY_CARE_PROVIDER_SITE_OTHER): Payer: Medicare Other | Admitting: Allergy and Immunology

## 2018-08-10 VITALS — BP 122/76 | HR 78 | Temp 98.1°F | Resp 16 | Ht 64.0 in | Wt 135.6 lb

## 2018-08-10 DIAGNOSIS — J310 Chronic rhinitis: Secondary | ICD-10-CM

## 2018-08-10 DIAGNOSIS — T7840XD Allergy, unspecified, subsequent encounter: Secondary | ICD-10-CM

## 2018-08-10 DIAGNOSIS — T783XXD Angioneurotic edema, subsequent encounter: Secondary | ICD-10-CM

## 2018-08-10 DIAGNOSIS — T783XXA Angioneurotic edema, initial encounter: Secondary | ICD-10-CM | POA: Insufficient documentation

## 2018-08-10 DIAGNOSIS — T7840XA Allergy, unspecified, initial encounter: Secondary | ICD-10-CM | POA: Insufficient documentation

## 2018-08-10 MED ORDER — FLUTICASONE PROPIONATE 50 MCG/ACT NA SUSP: 2.0000 | Freq: Every day | NASAL | 6 refills | Status: DC

## 2018-08-10 MED ORDER — FEXOFENADINE HCL 180 MG PO TABS: 180.0000 mg | ORAL_TABLET | Freq: Every day | ORAL | 5 refills | Status: AC

## 2018-08-10 MED ORDER — EPINEPHRINE 0.3 MG/0.3ML IJ SOAJ: 0.3000 mg | INTRAMUSCULAR | 2 refills | Status: DC | PRN

## 2018-08-10 MED FILL — EPINEPHRINE 0.3 MG AUTO-INJ: 0.3 | 30 days supply | Qty: 2 | Fill #0

## 2018-08-10 MED FILL — FLUTICASONE PROP 50 MCG SPR: 50 | 30 days supply | Qty: 16 | Fill #0

## 2018-08-10 NOTE — Progress Notes (Signed)
New Patient Note  RE: Alexander [name] MRN: [id number] DOB: 06/01/1945 Date of Office Visit: 08/10/2018  Referring provider: Rutherford Guys, MD Primary care provider: Rutherford Guys, MD  Chief Complaint: swelling (lip, tongue and facial swelling)   History of present illness: Alexander [name] is a 73 y.o. male seen today in consultation requested by Grant Fontana, MD.  He is accompanied today by his daughter who assists with the history.  He reports that since 2007 or 2008 he has had recurrent episodes of swelling of the lips, eyelids, and face.  This has occurred approximately 2 times per year on average.  He does not experience concomitant urticaria, cardiopulmonary symptoms, or GI symptoms.  He does note prodromal tingling prior to the onset of angioedema.  The swelling typically takes a few hours to 1 to 2 days to resolve.  No specific medication, food, skin care product, detergent, soap, or other environmental triggers have been identified.  He is not aware of any family members with a history of angioedema.  He had been on lisinopril in the past, however this medication was stopped approximately 3 years ago thinking that the problem may have been a's inhibitor induced angioedema.  However, the episodes of angioedema have persisted over the past 3 years.  The angioedema was severe enough 3 weeks ago while visiting [facility] that he sought evaluation and treatment at the emergency department.  The patient experiences rhinorrhea and nasal congestion on occasion.  He attempts to control the symptoms with fluticasone nasal spray and has been prescribed montelukast in the past.  No significant seasonal symptom variation has been noted nor have specific environmental triggers been identified.  Assessment and plan: Angioedema Unclear etiology.  Food allergen skin testing was negative today despite a positive histamine control.  The patient is not taking an ACE inhibitor  (discontinued approximately 3 years ago). NSAIDs may exacerbate angioedema but in this case are not the underlying etiology as demonstrated by the fact that the patient has experienced angioedema in the absence of NSAIDs. There are no concomitant symptoms concerning for anaphylaxis or constitutional symptoms worrisome for an underlying malignancy.  The patient does not experience concomitant urticaria.  Will order labs to rule out hereditary angioedema, acquired angioedema, urticaria associated angioedema, and other potential etiologies. For symptom relief, patient is to take oral antihistamines as directed. However, if the underlying pathophysiology is bradykinin mediated, antihistamines will not reduce symptoms.  The following labs have been ordered: Tryptase, C4, C1 esterase inhibitor (quantitative and functional), C1q, factor XII, CBC, CMP, and alpha gal panel.  The patient will be notified with further recommendations after lab results have returned.  A prescription has been provided for fexofenadine (Allegra) 180 mg daily as needed.  Should symptoms recur, a  journal is to be kept recording any foods eaten, beverages consumed, medications taken, activities performed, and environmental conditions within a 6 hour period prior to the onset of symptoms. For any symptoms concerning for anaphylaxis, epinephrine is to be administered and 911 is to be called immediately.  A prescription has been provided for epinephrine 0.3 mg autoinjector (EpiPen) 2 pack along with instructions for its proper administration.  Chronic rhinitis Non-allergic rhinitis.  All seasonal and perennial aeroallergen skin tests are negative despite a positive histamine control.  Intranasal steroids and intranasal antihistamines are effective for symptoms associated with non-allergic rhinitis, whereas second generation antihistamines such as cetirizine, loratadine and fexofenadine have been found to be ineffective for this  condition.  Continue fluticasone nasal spray, one spray per nostril 1-2 times daily as needed.   Nasal saline spray (i.e. Simply Saline) is recommended prior to medicated nasal sprays and as needed.   Meds ordered this encounter  Medications   fexofenadine (ALLEGRA) 180 MG tablet    Sig: Take 1 tablet (180 mg total) by mouth daily.    Dispense:  30 tablet    Refill:  5   EPINEPHrine (EPIPEN 2-PAK) 0.3 mg/0.3 mL IJ SOAJ injection    Sig: Inject 0.3 mLs (0.3 mg total) into the muscle as needed for anaphylaxis.    Dispense:  2 each    Refill:  2   fluticasone (FLONASE) 50 MCG/ACT nasal spray    Sig: Place 2 sprays into both nostrils daily.    Dispense:  16 g    Refill:  6    Diagnostics: Environmental skin testing: Negative despite a positive histamine control. Food allergen skin testing: Negative despite a positive histamine control.    Physical examination: Blood pressure 122/76, pulse 78, temperature 98.1 F (36.7 C), temperature source Temporal, resp. rate 16, height 5\' 4"  (1.626 m), weight 135 lb 9.6 oz (61.5 kg), SpO2 98 %.  General: Alert, interactive, in no acute distress. HEENT: TMs pearly gray, turbinates mildly edematous without discharge, post-pharynx unremarkable. Neck: Supple without lymphadenopathy. Lungs: Clear to auscultation without wheezing, rhonchi or rales. CV: Normal S1, S2 without murmurs. Abdomen: Nondistended, nontender. Skin: Warm and dry, without lesions or rashes. Extremities:  No clubbing, cyanosis or edema. Neuro:   Grossly intact.  Review of systems:  Review of systems negative except as noted in HPI / PMHx or noted below: Review of Systems  Constitutional: Negative.   HENT: Negative.   Eyes: Negative.   Respiratory: Negative.   Cardiovascular: Negative.   Gastrointestinal: Negative.   Genitourinary: Negative.   Musculoskeletal: Negative.   Skin: Negative.   Neurological: Negative.   Endo/Heme/Allergies: Negative.    Psychiatric/Behavioral: Negative.     Past medical history:  Past Medical History:  Diagnosis Date   Angio-edema    Cancer ([place])    prostate   Diabetes mellitus without complication ([place])    Hypertension    Neuromuscular disorder ([place])    Peripheral arterial disease ([place]) 11/14/2016   non-occlusive, asymptomatic, seen by vascular surgery Dr. Oneida Alar who rec repeated ABIs annually    Past surgical history:  Past Surgical History:  Procedure Laterality Date   COLON SURGERY     "locked bowel" fixed   HERNIA REPAIR     LEFT HEART CATHETERIZATION WITH CORONARY ANGIOGRAM N/A 09/15/2013   Procedure: LEFT HEART CATHETERIZATION WITH CORONARY ANGIOGRAM;  Surgeon: Peter M Martinique, MD;  Location: [facility] CATH LAB;  Service: Cardiovascular;  Laterality: N/A;   PROSTATE SURGERY  2005    Family history: Family History  Problem Relation Age of Onset   Diabetes Mother    Depression Father    Hyperlipidemia Sister     Social history: Social History   Socioeconomic History   Marital status: Widowed    Spouse name: Not on file   Number of children: 2   Years of education: 9   Highest education level: Not on file  Occupational History   Occupation: retired  Scientist, product/process development strain: Not on file   Food insecurity    Worry: Not on file    Inability: Not on file   Transportation needs    Medical: Not on file    Non-medical: Not on file  Tobacco Use   Smoking status: Current Every Day Smoker    Packs/day: 1.00    Years: 55.00    Pack years: 55.00    Types: Cigarettes   Smokeless tobacco: Never Used  Substance and Sexual Activity   Alcohol use: No   Drug use: No   Sexual activity: Yes  Lifestyle   Physical activity    Days per week: Not on file    Minutes per session: Not on file   Stress: Not on file  Relationships   Social connections    Talks on phone: Not on file    Gets together: Not on file    Attends religious service: Not  on file    Active member of club or organization: Not on file    Attends meetings of clubs or organizations: Not on file    Relationship status: Not on file   Intimate partner violence    Fear of current or ex partner: Not on file    Emotionally abused: Not on file    Physically abused: Not on file    Forced sexual activity: Not on file  Other Topics Concern   Not on file  Social History Narrative   Patient is single and his daughter lives with him.   Patient has two children.   Patient has a 9th grade education.   Patient works in Architect (part-time).   Patient drinks one to two cups of coffee daily.   Patient is right-handed.         Environmental History: The patient lives in a house with carpeting in the bedroom and central air/heat.  There is no known mold/water damage in the home.  There are no pets in the home.  He smokes cigarettes with a 20-pack-year history.  Allergies as of 08/10/2018      Reactions   Lisinopril Swelling   angioedema      Medication List       Accurate as of August 10, 2018 12:05 PM. If you have any questions, ask your nurse or doctor.        STOP taking these medications   predniSONE 20 MG tablet Commonly known as: DELTASONE Stopped by: Edmonia Lynch, MD     TAKE these medications   amLODipine 10 MG tablet Commonly known as: NORVASC TAKE 1 TABLET BY MOUTH  DAILY   aspirin EC 81 MG tablet Take 81 mg by mouth daily.   atorvastatin 40 MG tablet Commonly known as: LIPITOR Take 1 tablet (40 mg total) by mouth daily.   diphenhydrAMINE 25 MG tablet Commonly known as: BENADRYL Take 25 mg by mouth every 6 (six) hours as needed.   EPINEPHrine 0.3 mg/0.3 mL Soaj injection Commonly known as: EpiPen 2-Pak Inject 0.3 mLs (0.3 mg total) into the muscle as needed for anaphylaxis. Started by: Edmonia Lynch, MD   fexofenadine 180 MG tablet Commonly known as: ALLEGRA Take 1 tablet (180 mg total) by mouth daily. Started by: Edmonia Lynch, MD   fluticasone 50 MCG/ACT nasal spray Commonly known as: FLONASE Place 2 sprays into both nostrils daily.   gabapentin 300 MG capsule Commonly known as: NEURONTIN TAKE 3 CAPSULES BY MOUTH  TWO TIMES DAILY   glipiZIDE 5 MG tablet Commonly known as: GLUCOTROL Take 0.5 tablets (2.5 mg total) by mouth daily before breakfast.   hydrochlorothiazide 25 MG tablet Commonly known as: HYDRODIURIL TAKE 1 TABLET BY MOUTH  DAILY   HYDROcodone-acetaminophen 10-325 MG tablet Commonly known as:  NORCO Take 1 tablet by mouth every 6 (six) hours as needed for moderate pain. Dx M54.5, M54.16, G89.29   meloxicam 7.5 MG tablet Commonly known as: MOBIC TAKE 1 TABLET BY MOUTH  DAILY AS NEEDED FOR PAIN   metFORMIN 1000 MG tablet Commonly known as: GLUCOPHAGE Take 1 tablet (1,000 mg total) by mouth 2 (two) times daily with a meal.   montelukast 10 MG tablet Commonly known as: SINGULAIR TAKE 1 TABLET BY MOUTH AT  BEDTIME   sildenafil 100 MG tablet Commonly known as: VIAGRA Take 100 mg by mouth.   tiZANidine 4 MG tablet Commonly known as: ZANAFLEX TAKE 1 TABLET BY MOUTH  EVERY 8 HOURS AS NEEDED FOR MUSCLE SPASMS IN  BACK/LEGS/FEET.   triamcinolone cream 0.1 % Commonly known as: KENALOG APPLY TOPICALLY TWO TIMES A DAY   vitamin C 500 MG tablet Commonly known as: ASCORBIC ACID Take 500 mg daily by mouth.       Known medication allergies: Allergies  Allergen Reactions   Lisinopril Swelling    angioedema    I appreciate the opportunity to take part in Hanley's care. Please do not hesitate to contact me with questions.  Sincerely,   R. Edgar Frisk, MD

## 2018-08-10 NOTE — Assessment & Plan Note (Signed)
Non-allergic rhinitis.  All seasonal and perennial aeroallergen skin tests are negative despite a positive histamine control.  Intranasal steroids and intranasal antihistamines are effective for symptoms associated with non-allergic rhinitis, whereas second generation antihistamines such as cetirizine, loratadine and fexofenadine have been found to be ineffective for this condition.  Continue fluticasone nasal spray, one spray per nostril 1-2 times daily as needed.   Nasal saline spray (i.e. Simply Saline) is recommended prior to medicated nasal sprays and as needed.

## 2018-08-10 NOTE — Patient Instructions (Addendum)
Angioedema Unclear etiology.  Food allergen skin testing was negative today despite a positive histamine control.  The patient is not taking an ACE inhibitor (discontinued approximately 3 years ago). NSAIDs may exacerbate angioedema but in this case are not the underlying etiology as demonstrated by the fact that the patient has experienced angioedema in the absence of NSAIDs. There are no concomitant symptoms concerning for anaphylaxis or constitutional symptoms worrisome for an underlying malignancy.  The patient does not experience concomitant urticaria.  Will order labs to rule out hereditary angioedema, acquired angioedema, urticaria associated angioedema, and other potential etiologies. For symptom relief, patient is to take oral antihistamines as directed. However, if the underlying pathophysiology is bradykinin mediated, antihistamines will not reduce symptoms.  The following labs have been ordered: Tryptase, C4, C1 esterase inhibitor (quantitative and functional), C1q, factor XII, CBC, CMP, and alpha gal panel.  The patient will be notified with further recommendations after lab results have returned.  A prescription has been provided for fexofenadine (Allegra) 180 mg daily as needed.  Should symptoms recur, a  journal is to be kept recording any foods eaten, beverages consumed, medications taken, activities performed, and environmental conditions within a 6 hour period prior to the onset of symptoms. For any symptoms concerning for anaphylaxis, epinephrine is to be administered and 911 is to be called immediately.  A prescription has been provided for epinephrine 0.3 mg autoinjector (EpiPen) 2 pack along with instructions for its proper administration.  Chronic rhinitis Non-allergic rhinitis.  All seasonal and perennial aeroallergen skin tests are negative despite a positive histamine control.  Intranasal steroids and intranasal antihistamines are effective for symptoms associated with  non-allergic rhinitis, whereas second generation antihistamines such as cetirizine, loratadine and fexofenadine have been found to be ineffective for this condition.  Continue fluticasone nasal spray, one spray per nostril 1-2 times daily as needed.   Nasal saline spray (i.e. Simply Saline) is recommended prior to medicated nasal sprays and as needed.   When lab results have returned the patient will be called with further recommendations.

## 2018-08-10 NOTE — Assessment & Plan Note (Signed)
Unclear etiology.  Food allergen skin testing was negative today despite a positive histamine control.  The patient is not taking an ACE inhibitor (discontinued approximately 3 years ago). NSAIDs may exacerbate angioedema but in this case are not the underlying etiology as demonstrated by the fact that the patient has experienced angioedema in the absence of NSAIDs. There are no concomitant symptoms concerning for anaphylaxis or constitutional symptoms worrisome for an underlying malignancy.  The patient does not experience concomitant urticaria.  Will order labs to rule out hereditary angioedema, acquired angioedema, urticaria associated angioedema, and other potential etiologies. For symptom relief, patient is to take oral antihistamines as directed. However, if the underlying pathophysiology is bradykinin mediated, antihistamines will not reduce symptoms.  The following labs have been ordered: Tryptase, C4, C1 esterase inhibitor (quantitative and functional), C1q, factor XII, CBC, CMP, and alpha gal panel.  The patient will be notified with further recommendations after lab results have returned.  A prescription has been provided for fexofenadine (Allegra) 180 mg daily as needed.  Should symptoms recur, a  journal is to be kept recording any foods eaten, beverages consumed, medications taken, activities performed, and environmental conditions within a 6 hour period prior to the onset of symptoms. For any symptoms concerning for anaphylaxis, epinephrine is to be administered and 911 is to be called immediately.  A prescription has been provided for epinephrine 0.3 mg autoinjector (EpiPen) 2 pack along with instructions for its proper administration.

## 2018-08-13 ENCOUNTER — Other Ambulatory Visit: Payer: Self-pay | Admitting: *Deleted

## 2018-08-13 ENCOUNTER — Other Ambulatory Visit: Payer: Self-pay

## 2018-08-13 DIAGNOSIS — Z87891 Personal history of nicotine dependence: Secondary | ICD-10-CM

## 2018-08-13 DIAGNOSIS — F17210 Nicotine dependence, cigarettes, uncomplicated: Secondary | ICD-10-CM

## 2018-08-13 DIAGNOSIS — Z122 Encounter for screening for malignant neoplasm of respiratory organs: Secondary | ICD-10-CM

## 2018-08-13 NOTE — Telephone Encounter (Signed)
Requested Prescriptions   Pending Prescriptions Disp Refills  . meloxicam (MOBIC) 7.5 MG tablet 90 tablet 0    Sig: Take 1 tablet (7.5 mg total) by mouth daily as needed. for pain    Last OV 07/31/2018  Last written 05/05/2018

## 2018-08-14 MED ORDER — MELOXICAM 7.5 MG PO TABS: 7.5000 mg | ORAL_TABLET | Freq: Every day | ORAL | 0 refills | Status: DC | PRN

## 2018-08-14 MED FILL — MELOXICAM 7.5 MG TABLET: 7.5 | 90 days supply | Qty: 90 | Fill #0

## 2018-08-18 LAB — CBC WITH DIFFERENTIAL/PLATELET
Basophils Absolute: 0.1 10*3/uL (ref 0.0–0.2)
Basos: 1 %
EOS (ABSOLUTE): 0.2 10*3/uL (ref 0.0–0.4)
Eos: 3 %
Hematocrit: 41.2 % (ref 37.5–51.0)
Hemoglobin: 13.5 g/dL (ref 13.0–17.7)
Immature Grans (Abs): 0 10*3/uL (ref 0.0–0.1)
Immature Granulocytes: 0 %
Lymphocytes Absolute: 2.6 10*3/uL (ref 0.7–3.1)
Lymphs: 31 %
MCH: 29.5 pg (ref 26.6–33.0)
MCHC: 32.8 g/dL (ref 31.5–35.7)
MCV: 90 fL (ref 79–97)
Monocytes Absolute: 0.3 10*3/uL (ref 0.1–0.9)
Monocytes: 4 %
Neutrophils Absolute: 5.3 10*3/uL (ref 1.4–7.0)
Neutrophils: 61 %
Platelets: 230 10*3/uL (ref 150–450)
RBC: 4.58 x10E6/uL (ref 4.14–5.80)
RDW: 14.4 % (ref 11.6–15.4)
WBC: 8.6 10*3/uL (ref 3.4–10.8)

## 2018-08-18 LAB — ALPHA-GAL PANEL
Alpha Gal IgE*: <0.1 kU/L (ref <0.10)
Beef (Bos spp) IgE: <0.1 kU/L (ref <0.35)
Class Interpretation: 0
Class Interpretation: 0
Class Interpretation: 0
Lamb/Mutton (Ovis spp) IgE: <0.1 kU/L (ref <0.35)
Pork (Sus spp) IgE: <0.1 kU/L (ref <0.35)

## 2018-08-18 LAB — COMPREHENSIVE METABOLIC PANEL
ALT: 11 IU/L (ref 0–44)
AST: 13 IU/L (ref 0–40)
Albumin/Globulin Ratio: 1.7 (ref 1.2–2.2)
Albumin: 4.5 g/dL (ref 3.7–4.7)
Alkaline Phosphatase: 110 IU/L (ref 39–117)
BUN/Creatinine Ratio: 18 (ref 10–24)
BUN: 23 mg/dL (ref 8–27)
Bilirubin Total: 0.3 mg/dL (ref 0.0–1.2)
CO2: 23 mmol/L (ref 20–29)
Calcium: 10 mg/dL (ref 8.6–10.2)
Chloride: 99 mmol/L (ref 96–106)
Creatinine, Ser: 1.29 mg/dL — ABNORMAL HIGH (ref 0.76–1.27)
GFR calc Af Amer: 64 mL/min/{1.73_m2} (ref >59)
GFR calc non Af Amer: 55 mL/min/{1.73_m2} — ABNORMAL LOW (ref >59)
Globulin, Total: 2.7 g/dL (ref 1.5–4.5)
Glucose: 106 mg/dL — ABNORMAL HIGH (ref 65–99)
Potassium: 4.6 mmol/L (ref 3.5–5.2)
Sodium: 140 mmol/L (ref 134–144)
Total Protein: 7.2 g/dL (ref 6.0–8.5)

## 2018-08-18 LAB — C1 ESTERASE INHIBITOR, FUNCTIONAL: C1INH Functional/C1INH Total MFr SerPl: 96 %mean normal

## 2018-08-18 LAB — FACTOR 12 ASSAY: Factor XII Activity: 79 % (ref 50–150)

## 2018-08-18 LAB — COMPLEMENT COMPONENT C1Q: Complement C1Q: 20 mg/dL (ref 10.2–20.3)

## 2018-08-18 LAB — TRYPTASE: Tryptase: 12.5 ug/L (ref 2.2–13.2)

## 2018-08-18 LAB — C1 ESTERASE INHIBITOR: C1INH SerPl-mCnc: 34 mg/dL (ref 21–39)

## 2018-08-18 LAB — C4 COMPLEMENT: Complement C4, Serum: 37 mg/dL (ref 14–44)

## 2018-08-26 ENCOUNTER — Telehealth: Payer: Self-pay | Admitting: *Deleted

## 2018-08-26 NOTE — Telephone Encounter (Signed)
Patient's daughter Aniceto Boss called to get lab results reviewed lab results with patient's daughter

## 2018-08-28 DIAGNOSIS — H524 Presbyopia: Secondary | ICD-10-CM | POA: Diagnosis not present

## 2018-08-28 DIAGNOSIS — H2513 Age-related nuclear cataract, bilateral: Secondary | ICD-10-CM | POA: Diagnosis not present

## 2018-09-07 ENCOUNTER — Other Ambulatory Visit: Payer: Self-pay

## 2018-09-07 NOTE — Patient Outreach (Signed)
[place] [facility]) Care Management  09/07/2018  Naithen [facility] 08-Nov-1945 [id number]   Medication Adherence call to Mr. Cutler Compliant Voice message left with a call back number. Mr. Goodpasture is showing past due on Atorvastatin 40 mg under [place].   [place] Management Direct Dial [phone]  Fax [phone] [email or web address]

## 2018-09-22 ENCOUNTER — Other Ambulatory Visit: Payer: Self-pay

## 2018-09-22 NOTE — Patient Outreach (Signed)
[place] [facility]) Care Management  09/22/2018  Alexander [name] [facility] 1945-03-29 [id number]   Medication Adherence call to Mrs. Alexander [name] Compliant Voice message left with a call back number. Mr. Alexander [name] is showing past due on Atorvastatin 40 mg and Metformin 1000 mg under Alexander [name].   [place] Management Direct Dial [phone]  Fax [phone] [name]

## 2018-09-23 ENCOUNTER — Other Ambulatory Visit: Payer: Self-pay

## 2018-09-23 NOTE — Patient Outreach (Signed)
[facility]) Care Management  09/23/2018  Alexander [name] [facility] 1945/03/26 [id number]   Medication Adherence call back from Mr. Alexander [name] patient is showing past due on Ramipril 5 mg and Metformin 500 mg patient explain he still have medication but was going to check and will call back patient is showing past due under [place].   [place] Management Direct Dial [phone]  Fax [phone] [email or web address]

## 2018-10-15 ENCOUNTER — Other Ambulatory Visit: Payer: Self-pay | Admitting: Family Medicine

## 2018-10-15 DIAGNOSIS — M5416 Radiculopathy, lumbar region: Secondary | ICD-10-CM

## 2018-10-15 MED ORDER — HYDROCODONE-ACETAMINOPHEN 10-325 MG PO TABS: 1.0000 | ORAL_TABLET | Freq: Four times a day (QID) | ORAL | 0 refills | Status: DC | PRN

## 2018-10-15 NOTE — Telephone Encounter (Signed)
Requested medication (s) are due for refill today: yes  Requested medication (s) are on the active medication list: yes  Last refill:  06/24/2018  Future visit scheduled: yes  Notes to clinic:  Refill cannot be delegated   Requested Prescriptions  Pending Prescriptions Disp Refills   HYDROcodone-acetaminophen (NORCO) 10-325 MG tablet 20 tablet 0    Sig: Take 1 tablet by mouth every 6 (six) hours as needed for moderate pain. Dx M54.5, M54.16, G89.29     There is no refill protocol information for this order

## 2018-10-15 NOTE — Telephone Encounter (Signed)
Requested Prescriptions   Pending Prescriptions Disp Refills  . HYDROcodone-acetaminophen (NORCO) 10-325 MG tablet 20 tablet 0    Sig: Take 1 tablet by mouth every 6 (six) hours as needed for moderate pain. Dx M54.5, M54.16, G89.29    Last OV 07/31/2018  Last written 06/24/2018

## 2018-10-15 NOTE — Telephone Encounter (Signed)
Patient requesting HYDROcodone-acetaminophen ([place]) 10-325 MG tablet please send to [facility], informed patient please allow 48 to 72 hour turn around time

## 2018-10-15 NOTE — Telephone Encounter (Signed)
pmp reviewd, appropriate meds refilled 

## 2018-10-16 ENCOUNTER — Encounter: Payer: Self-pay | Admitting: *Deleted

## 2018-10-20 ENCOUNTER — Ambulatory Visit: Payer: Medicare Other | Attending: Radiation Oncology | Admitting: Physical Therapy

## 2018-10-20 ENCOUNTER — Other Ambulatory Visit: Payer: Self-pay

## 2018-10-20 ENCOUNTER — Encounter: Payer: Self-pay | Admitting: Physical Therapy

## 2018-10-20 DIAGNOSIS — R279 Unspecified lack of coordination: Secondary | ICD-10-CM | POA: Insufficient documentation

## 2018-10-20 DIAGNOSIS — M6281 Muscle weakness (generalized): Secondary | ICD-10-CM | POA: Diagnosis not present

## 2018-10-20 DIAGNOSIS — C61 Malignant neoplasm of prostate: Secondary | ICD-10-CM | POA: Diagnosis present

## 2018-10-20 NOTE — Therapy (Signed)
[facility] [address], [place], [place], [address] Phone: [phone]   Fax:  [phone]  Physical Therapy Evaluation  Patient Details  Name: Alexander [name] MRN: [id number] Date of Birth: June 30, 1945 Referring Provider (PT): Dr. Thea Silversmith   Encounter Date: 10/20/2018  PT End of Session - 10/20/18 1021    Visit Number  1    Date for PT Re-Evaluation  12/15/18    Authorization Type  [employer] medicare    PT Start Time  0930    PT Stop Time  1021    PT Time Calculation (min)  51 min    Activity Tolerance  Patient tolerated treatment well    Behavior During Therapy  [facility] for tasks assessed/performed       Past Medical History:  Diagnosis Date  . Angio-edema   . Cancer [facility])    prostate  . Diabetes mellitus without complication ([place])   . Hypertension   . Neuromuscular disorder ([place])   . Peripheral arterial disease ([place]) 11/14/2016   non-occlusive, asymptomatic, seen by vascular surgery Dr. Oneida Alar who rec repeated ABIs annually    Past Surgical History:  Procedure Laterality Date  . COLON SURGERY     "locked bowel" fixed  . HERNIA REPAIR    . LEFT HEART CATHETERIZATION WITH CORONARY ANGIOGRAM N/A 09/15/2013   Procedure: LEFT HEART CATHETERIZATION WITH CORONARY ANGIOGRAM;  Surgeon: Peter M Martinique, MD;  Location: [facility] CATH LAB;  Service: Cardiovascular;  Laterality: N/A;  . PROSTATE SURGERY  2005    There were no vitals filed for this visit.   Subjective Assessment - 10/20/18 0934    Subjective  Patient had prostate cancer 2005. After the operation he had urinary leakage. PSA increased and has some cancer cells. TC scan showed cells so patient is to have radiation.    Patient Stated Goals  reduce urinary leakage    Currently in Pain?  No/denies         [facility] PT Assessment - 10/20/18 0001      Assessment   Medical Diagnosis  C61 Malignant neoplasm of prostate    Referring Provider (PT)  Dr. Thea Silversmith    Onset Date/Surgical Date  --   2005   Prior Therapy  none      Precautions   Precautions  Other (comment)    Precaution Comments  prostate cancer      Restrictions   Weight Bearing Restrictions  No      Balance Screen   Has the patient fallen in the past 6 months  No    Has the patient had a decrease in activity level because of a fear of falling?   No    Is the patient reluctant to leave their home because of a fear of falling?   No      Home Film/video editor residence      Prior Function   Level of Independence  Independent    Vocation  Part time employment    Scientist, forensic    Leisure  wants to start walking      Cognition   Overall Cognitive Status  Within Functional Limits for tasks assessed      Posture/Postural Control   Posture/Postural Control  Postural limitations    Postural Limitations  Flexed trunk      ROM / Strength   AROM / PROM / Strength  AROM;PROM;Strength  AROM   Lumbar Extension  decreased by 50%    Lumbar - Right Side Bend  decreased by 25%    Lumbar - Left Side Bend  decreased by 25%      PROM   Right Hip External Rotation   40    Left Hip External Rotation   30      Strength   Right Hip Extension  3+/5    Left Hip Extension  3+/5      Flexibility   Soft Tissue Assessment /Muscle Length  yes    Quadriceps  very tight, trouble getting hips to neutral      Palpation   Palpation comment  tightness in the abdomen and diaphgram, difficulty with diaphragmatic breathing,                 Objective measurements completed on examination: See above findings.    Pelvic Floor Special Questions - 10/20/18 0001    Currently Sexually Active  Yes   uses viagra, sometimes able to without viagra and no last   Urinary Leakage  Yes    Pad use  1 depends    Activities that cause leaking  Other    Other activities that cause leaking  just happens, leak more during the day, no  leakage at night    Urinary urgency  Yes    Urinary frequency  goes 3 -4 times per day    Fecal incontinence  No   no constipation   Exam Type  Deferred    Biofeedback  sitting resting tone is at 2 uv, contract , sitting contract with verbal cues to breath    Biofeedback sensor type  Surface   rectum              PT Education - 10/20/18 1414    Education Details  pelvic floor contraction in sitting and sidely    Person(s) Educated  Patient    Methods  Explanation;Demonstration;Handout;Verbal cues    Comprehension  Returned demonstration;Verbalized understanding       PT Short Term Goals - 10/20/18 1426      PT SHORT TERM GOAL #1   Title  independent with initial HEP    Time  4    Period  Weeks    Status  New    Target Date  11/17/18      PT SHORT TERM GOAL #2   Title  able to perform diaphragmatic breathing due to improved mobility of the abdominal and pelvic floor tissue    Time  4    Period  Weeks    Status  New    Target Date  11/17/18      PT SHORT TERM GOAL #3   Title  able to hold a pelvic floor contraction for 10 seconds in sidely and 5 quick contractions to strengthen pelvic floor    Time  4    Period  Weeks    Status  New    Target Date  11/17/18      PT SHORT TERM GOAL #4   Title  understand what bladder irritants are and how they affect the bladder    Time  4    Period  Weeks    Status  New    Target Date  11/17/18        PT Long Term Goals - 10/20/18 1428      PT LONG TERM GOAL #1   Title  independent with HEP and understand how  to advance    Time  8    Period  Weeks    Status  New    Target Date  12/15/18      PT LONG TERM GOAL #2   Title  urinary leakage decreased >/= 70% due to improve pelvic floor strength and holding a contraction for 20 seconds    Time  8    Period  Weeks    Status  New    Target Date  12/15/18      PT LONG TERM GOAL #3   Title  not have to wear a depends pad due to reduction of urinary leakage    Time   8    Period  Weeks    Status  New    Target Date  12/15/18      PT LONG TERM GOAL #4   Title  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 Patient has leaked urine since then and wears 1 depends  per day. Patient will leak urine without activity. Patient does not leak urine at night. 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 Hip external rotation P/ROM on left is 30 degrees and right is 40 degrees. Bilateral hip extension strength is 3+/5. Patient resting level is 2 uv. He is able to contract to 10 uv for 3 contractions in sitting then has difficulty getting above 4 uv. Patient is able to contract for 3 seconds. Patient is able to have an erection without Viagra at times but it does not last as long. Patient may have radiation to the pelvic region after several test are performed due to some cancer cells are present. Patient will benefit from skilled therapy to improve pelvic floor strength and endurance to reduce urinary leakage.    Personal Factors and Comorbidities  Age;Comorbidity 2;Comorbidity 3+;Time since onset of injury/illness/exacerbation;Sex    Comorbidities  s/p prostate cancer 2005; Diabetes, Hypertension, s/p hernia repair    Examination-Activity Limitations  Toileting;Continence    Examination-Participation Restrictions  Interpersonal Relationship    Stability/Clinical Decision Making  Evolving/Moderate complexity    Clinical Decision Making  Moderate    Rehab Potential  Excellent    PT Frequency  1x / week    PT Duration  8 weeks    PT Treatment/Interventions  Biofeedback;Therapeutic activities;Therapeutic exercise;Neuromuscular  re-education;Patient/family education;Dry needling;Scar mobilization;Manual techniques;Spinal Manipulations    PT Next Visit Plan  hip stretches, education on soft tissue to the perineum, abdominal soft tissue work, abdominal breathing, back stretches, pelvic floor contraction    Consulted and Agree with Plan of Care  Patient       Patient will benefit from skilled therapeutic intervention in order to improve the following deficits and impairments:  Decreased coordination, Increased fascial restricitons, Decreased activity tolerance, Decreased scar mobility, Decreased strength, Postural dysfunction  Visit Diagnosis: Muscle weakness (generalized) - Plan: PT plan of care cert/re-cert  Unspecified lack of coordination - Plan: PT plan of care cert/re-cert  Prostate cancer [facility]) - Plan: PT plan of care  cert/re-cert     Problem List Patient Active Problem List   Diagnosis Date Noted  . Allergic reaction 08/10/2018  . Angioedema 08/10/2018  . Peripheral arterial occlusive disease ([place]) 10/28/2016  . Hyperlipidemia LDL goal <70 05/01/2015  . Tobacco use disorder 12/09/2013  . 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 Erectile dysfunction due to diseases classified elsewhere 12/09/2013  . Type 2 diabetes mellitus with other circulatory complications ([place]) [id number]  . Left lumbar radiculopathy 09/30/2012  . Chronic rhinitis 04/08/2011  . Hypertension   . HTN (hypertension), benign 03/09/2011  . History of prostate cancer 03/09/2011    Earlie Counts, PT 10/20/18 2:33 PM   [facility] [address] [place], [place], [address] Phone: [phone]   Fax:  [phone]  Name: Alexander [name] MRN: [id number] Date of Birth: Dec 28, 1945

## 2018-10-20 NOTE — Patient Instructions (Addendum)
Adduction: Hip - Knees Together With Pelvic Floor (Side-Lying)    Lie on left side, hips and knees slightly bent, towel roll between knees. Squeeze pelvic floor while pushing knees together. Hold for _5__ seconds. Rest for _5__ seconds. Repeat _5__ times. Do __5_ times a day.   Copyright  VHI. All rights reserved.  Slow Contraction: Gravity Resisted (Sitting)    Sitting, slowly squeeze pelvic floor for _3__ seconds. Rest for _5__ seconds. Repeat __3_ times. Do __3_ times a day.  Copyright  VHI. All rights reserved.  [facility] [address], [place], [place] [address] Phone # [phone] Fax [phone]

## 2018-10-21 ENCOUNTER — Other Ambulatory Visit: Payer: Self-pay

## 2018-10-21 ENCOUNTER — Ambulatory Visit (INDEPENDENT_AMBULATORY_CARE_PROVIDER_SITE_OTHER): Payer: Medicare Other | Admitting: Pulmonary Disease

## 2018-10-21 ENCOUNTER — Encounter: Payer: Self-pay | Admitting: Pulmonary Disease

## 2018-10-21 VITALS — Temp 97.8°F | Ht 65.0 in | Wt 135.8 lb

## 2018-10-21 DIAGNOSIS — R0609 Other forms of dyspnea: Secondary | ICD-10-CM | POA: Diagnosis not present

## 2018-10-21 MED ORDER — NICOTINE POLACRILEX 2 MG MT LOZG: 2.0000 mg | LOZENGE | OROMUCOSAL | 0 refills | Status: DC | PRN

## 2018-10-21 MED ORDER — CHANTIX STARTING MONTH PAK 0.5 MG X 11 & 1 MG X 42 PO TABS: ORAL_TABLET | ORAL | 0 refills | Status: DC

## 2018-10-21 MED FILL — NICOTINE 2 MG LOZENGE: 2 | 7 days supply | Qty: 72 | Fill #0

## 2018-10-21 NOTE — Patient Instructions (Signed)
Emphysema/COPD  Active smoking  Smoking cessation counseling -Trial with Chantix/nicotine lozenge  Obtain a breathing study  I will see you back in about 3 months Chronic Obstructive Pulmonary Disease  Chronic obstructive pulmonary disease (COPD) is a long-term (chronic) condition that affects the lungs. COPD is a general term that can be used to describe many different lung problems that cause lung swelling (inflammation) and limit airflow, including chronic bronchitis and emphysema. If you have COPD, your lung function will probably never return to normal. In most cases, it gets worse over time. However, there are steps you can take to slow the progression of the disease and improve your quality of life. What are the causes? This condition may be caused by:  Smoking. This is the most common cause.  Certain genes passed down through families. What increases the risk? The following factors may make you more likely to develop this condition:  Secondhand smoke from cigarettes, pipes, or cigars.  Exposure to chemicals and other irritants such as fumes and dust in the work environment.  Chronic lung conditions or infections. What are the signs or symptoms? Symptoms of this condition include:  Shortness of breath, especially during physical activity.  Chronic cough with a large amount of thick mucus. Sometimes the cough may not have any mucus (dry cough).  Wheezing.  Rapid breaths.  Gray or bluish discoloration (cyanosis) of the skin, especially in your fingers, toes, or lips.  Feeling tired (fatigue).  Weight loss.  Chest tightness.  Frequent infections.  Episodes when breathing symptoms become much worse (exacerbations).  Swelling in the ankles, feet, or legs. This may occur in later stages of the disease. How is this diagnosed? This condition is diagnosed based on:  Your medical history.  A physical exam. You may also have tests, including:  Lung (pulmonary)  function tests. This may include a spirometry test, which measures your ability to exhale properly.  Chest X-ray.  CT scan.  Blood tests. How is this treated? This condition may be treated with:  Medicines. These may include inhaled rescue medicines to treat acute exacerbations as well as long-term, or maintenance, medicines to prevent flare-ups of COPD. ? Bronchodilators help treat COPD by dilating the airways to allow increased airflow and make your breathing more comfortable. ? Steroids can reduce airway inflammation and help prevent exacerbations.  Smoking cessation. If you smoke, your health care provider may ask you to quit, and may also recommend therapy or replacement products to help you quit.  Pulmonary rehabilitation. This may involve working with a team of health care providers and specialists, such as respiratory, occupational, and physical therapists.  Exercise and physical activity. These are beneficial for nearly all people with COPD.  Nutrition therapy to gain weight, if you are underweight.  Oxygen. Supplemental oxygen therapy is only helpful if you have a low oxygen level in your blood (hypoxemia).  Lung surgery or transplant.  Palliative care. This is to help people with COPD feel comfortable when treatment is no longer working. Follow these instructions at home: Medicines  Take over-the-counter and prescription medicines (inhaled or pills) only as told by your health care provider.  Talk to your health care provider before taking any cough or allergy medicines. You may need to avoid certain medicines that dry out your airways. Lifestyle  If you are a smoker, the most important thing that you can do is to stop smoking. Do not use any products that contain nicotine or tobacco, such as cigarettes and e-cigarettes. If  you need help quitting, ask your health care provider. Continuing to smoke will cause the disease to progress faster.  Avoid exposure to things  that irritate your lungs, such as smoke, chemicals, and fumes.  Stay active, but balance activity with periods of rest. Exercise and physical activity will help you maintain your ability to do things you want to do.  Learn and use relaxation techniques to manage stress and to control your breathing.  Get the right amount of sleep and get quality sleep. Most adults need 7 or more hours per night.  Eat healthy foods. Eating smaller, more frequent meals and resting before meals may help you maintain your strength. Controlled breathing Learn and use controlled breathing techniques as directed by your health care provider. Controlled breathing techniques include:  Pursed lip breathing. Start by breathing in (inhaling) through your nose for 1 second. Then, purse your lips as if you were going to whistle and breathe out (exhale) through the pursed lips for 2 seconds.  Diaphragmatic breathing. Start by putting one hand on your abdomen just above your waist. Inhale slowly through your nose. The hand on your abdomen should move out. Then purse your lips and exhale slowly. You should be able to feel the hand on your abdomen moving in as you exhale. Controlled coughing Learn and use controlled coughing to clear mucus from your lungs. Controlled coughing is a series of short, progressive coughs. The steps of controlled coughing are: 1. Lean your head slightly forward. 2. Breathe in deeply using diaphragmatic breathing. 3. Try to hold your breath for 3 seconds. 4. Keep your mouth slightly open while coughing twice. 5. Spit any mucus out into a tissue. 6. Rest and repeat the steps once or twice as needed. General instructions  Make sure you receive all the vaccines that your health care provider recommends, especially the pneumococcal and influenza vaccines. Preventing infection and hospitalization is very important when you have COPD.  Use oxygen therapy and pulmonary rehabilitation if directed to by  your health care provider. If you require home oxygen therapy, ask your health care provider whether you should purchase a pulse oximeter to measure your oxygen level at home.  Work with your health care provider to develop a COPD action plan. This will help you know what steps to take if your condition gets worse.  Keep other chronic health conditions under control as told by your health care provider.  Avoid extreme temperature and humidity changes.  Avoid contact with people who have an illness that spreads from person to person (is contagious), such as viral infections or pneumonia.  Keep all follow-up visits as told by your health care provider. This is important. Contact a health care provider if:  You are coughing up more mucus than usual.  There is a change in the color or thickness of your mucus.  Your breathing is more labored than usual.  Your breathing is faster than usual.  You have difficulty sleeping.  You need to use your rescue medicines or inhalers more often than expected.  You have trouble doing routine activities such as getting dressed or walking around the house. Get help right away if:  You have shortness of breath while you are resting.  You have shortness of breath that prevents you from: ? Being able to talk. ? Performing your usual physical activities.  You have chest pain lasting longer than 5 minutes.  Your skin color is more blue (cyanotic) than usual.  You measure low oxygen  saturations for longer than 5 minutes with a pulse oximeter.  You have a fever.  You feel too tired to breathe normally. Summary  Chronic obstructive pulmonary disease (COPD) is a long-term (chronic) condition that affects the lungs.  Your lung function will probably never return to normal. In most cases, it gets worse over time. However, there are steps you can take to slow the progression of the disease and improve your quality of life.  Treatment for COPD may  include taking medicines, quitting smoking, pulmonary rehabilitation, and changes to diet and exercise. As the disease progresses, you may need oxygen therapy, a lung transplant, or palliative care.  To help manage your condition, do not smoke, avoid exposure to things that irritate your lungs, stay up to date on all vaccines, and follow your health care provider's instructions for taking medicines. This information is not intended to replace advice given to you by your health care provider. Make sure you discuss any questions you have with your health care provider. Document Released: 10/17/2004 Document Revised: 12/20/2016 Document Reviewed: 02/12/2016 Elsevier Patient Education  2020 [employer].

## 2018-10-21 NOTE — Progress Notes (Signed)
Subjective:    Patient ID: Alexander [name], male    DOB: 1945/02/08, 73 y.o.   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  HENT: Positive for dental problem and rhinorrhea. Negative for congestion, ear pain, nosebleeds, postnasal drip, sinus pressure, sneezing, sore throat and trouble swallowing.   Eyes: Negative for redness and itching.  Respiratory: Negative for cough, chest tightness, shortness of breath and wheezing.   Cardiovascular: Positive for leg swelling. Negative for palpitations.  Gastrointestinal: Negative for nausea and vomiting.  Genitourinary: Negative for dysuria.  Musculoskeletal: Negative for joint swelling.  Skin: Negative for rash.  Allergic/Immunologic: Positive for environmental allergies. Negative for food allergies and immunocompromised state.  Neurological: Negative for headaches.  Hematological: Does not bruise/bleed easily.  Psychiatric/Behavioral: Negative for dysphoric mood. The patient is not nervous/anxious.    Past Medical History:  Diagnosis Date  . Angio-edema   . Cancer [facility])    prostate  . Diabetes mellitus without complication ([place])   . Hypertension   . Neuromuscular disorder ([place])   . Peripheral arterial disease ([place]) 11/14/2016   non-occlusive, asymptomatic, seen by vascular  surgery Dr. Oneida Alar who rec repeated ABIs annually   Social History   Socioeconomic History  . Marital status: Widowed    Spouse name: Not on file  . Number of children: 2  . Years of education: 9  . Highest education level: Not on file  Occupational History  . Occupation: retired  Scientific laboratory technician  . Financial resource strain: Not on file  . Food insecurity    Worry: Not on file    Inability: Not on file  . Transportation needs    Medical: Not on file    Non-medical: Not on file  Tobacco Use  . Smoking status: Current Every Day Smoker    Packs/day: 1.00    Years: 55.00    Pack years: 55.00    Types: Cigarettes  . Smokeless tobacco: Never Used  Substance and Sexual Activity  . Alcohol use: No  . Drug use: No  . Sexual activity: Yes  Lifestyle  . Physical activity    Days per week: Not on file    Minutes per session: Not on file  . Stress: Not on file  Relationships  . Social Herbalist on phone: Not on file    Gets together: Not on file    Attends religious service: Not on file    Active member of club or organization: Not on file    Attends meetings of clubs or organizations: Not on file    Relationship status: Not on file  . Intimate partner violence    Fear of current or ex partner: Not on file    Emotionally abused: Not on file    Physically abused: Not on file    Forced sexual activity: Not  on file  Other Topics Concern  . Not on file  Social History Narrative   Patient is single and his daughter lives with him.   Patient has two children.   Patient has a 9th grade education.   Patient works in Architect (part-time).   Patient drinks one to two cups of coffee daily.   Patient is right-handed.         Family History  Problem Relation Age of Onset  . Diabetes Mother   . Depression Father   . Hyperlipidemia Sister       Objective:   Physical Exam Constitutional:      Appearance: Normal appearance.  HENT:     Head: Normocephalic and  atraumatic.  Eyes:     General:        Right eye: No discharge.        Left eye: No discharge.     Pupils: Pupils are equal, round, and reactive to light.  Neck:     Musculoskeletal: Normal range of motion and neck supple. No neck rigidity.  Cardiovascular:     Rate and Rhythm: Normal rate and regular rhythm.     Pulses: Normal pulses.     Heart sounds: No murmur.  Pulmonary:     Effort: Pulmonary effort is normal. No respiratory distress.     Breath sounds: Normal breath sounds. No stridor.  Abdominal:     General: Abdomen is flat. Bowel sounds are normal. There is no distension.     Palpations: There is no mass.     Tenderness: There is no abdominal tenderness.  Musculoskeletal: Normal range of motion.        General: No swelling or tenderness.  Skin:    General: Skin is warm and dry.  Neurological:     General: No focal deficit present.     Mental Status: He is alert.  Psychiatric:        Mood and Affect: Mood normal.    Today's Vitals   10/21/18 1106  Temp: 97.8 F (36.6 C)  TempSrc: Temporal  Weight: 135 lb 12.8 oz (61.6 kg)  Height: 5\' 5"  (1.651 m)   Body mass index is 22.6 kg/m.  CT scan of the chest was reviewed by myself showing extensive emphysematous changes, bullae.  Small nodules-unchanged from previous     Assessment & Plan:  .  Chronic obstructive pulmonary disease -Has no previous pulmonary function test on record -Is asymptomatic at present -Extensive emphysematous changes  .  Abnormal CT scan of the chest -Multiple nodules, unchanged from previous -Extensive emphysema  History of prostate cancer with possible recurrence -Plan for radiation treatment  Active smoker -Smoking cessation counseling -Willing to try Chantix and nicotine lozenges  Importance of quitting smoking discussed extensively  Plan: .  We will schedule the patient for pulmonary function tests .  Repeat CT in a year .  Smoking cessation-prescription for Chantix,  prescription for nicotine lozenges  Encouraged to call with any significant concerns .  We will see him back in the office in about 3 months

## 2018-10-26 ENCOUNTER — Encounter: Payer: Self-pay | Admitting: Radiation Oncology

## 2018-10-26 NOTE — Progress Notes (Signed)
GU Location of Tumor / Histology: Biochemical recurrent prostate cancer  If Prostate Cancer, Gleason Score is (3 + 3) and PSA is (8.7) pre treatment.  Alexander [name] underwent prostatectomy in 2006 which showed Gleason 6 prostate cancer bilaterally with negative margins. His postoperative PSA was negative. He was then lost to follow up for several years.  08/2018 PSA 1.06 04/2018 PSA 1.4 02/2017 PSA 0.6 03/2012 PSA 0.12 03/2011 PSA 0.06 07/2009 PSA 0.03    Past/Anticipated interventions by urology, if any: prostate biopsy, prostatectomy PET scan (negative)  Past/Anticipated interventions by medical oncology, if any: no  Weight changes, if any: no  Bowel/Bladder complaints, if any: IPSS 12. SHIM 18 with aid of Viagra. Reports mild stress urge incontinence. Denies dysuria or hematuria.     Nausea/Vomiting, if any: no  Pain issues, if any:  Reports chronic low back pain that radiates down his legs. States, "I have a bad disc that is pinching on a nerve." Reports low back pain is well managed with current pain regimen.   SAFETY ISSUES:  Prior radiation? no  Pacemaker/ICD? no  Possible current pregnancy? no, male patient  Is the patient on methotrexate? no  Current Complaints / other details:  73 year old male. Widowed with two daughters.  Worked as a Designer, industrial/product. Someday smoker. Evaluated by [facility] on 10/21/2018

## 2018-10-27 ENCOUNTER — Other Ambulatory Visit: Payer: Self-pay

## 2018-10-27 ENCOUNTER — Ambulatory Visit
Admission: RE | Admit: 2018-10-27 | Discharge: 2018-10-27 | Disposition: A | Discharge status: Home / Self Care | Payer: Medicare Other | Source: Ambulatory Visit | Attending: Radiation Oncology | Admitting: Radiation Oncology

## 2018-10-27 ENCOUNTER — Encounter: Payer: Self-pay | Admitting: Radiation Oncology

## 2018-10-27 VITALS — Ht 65.0 in | Wt 133.0 lb

## 2018-10-27 DIAGNOSIS — C61 Malignant neoplasm of prostate: Secondary | ICD-10-CM | POA: Insufficient documentation

## 2018-10-27 DIAGNOSIS — Z9889 Other specified postprocedural states: Secondary | ICD-10-CM | POA: Diagnosis not present

## 2018-10-27 HISTORY — DX: Malignant neoplasm of prostate: C61

## 2018-10-27 NOTE — Progress Notes (Signed)
Radiation Oncology         [phone] ________________________________  Initial outpatient Consultation - Conducted via MyChart due to current COVID-19 concerns for limiting Alexander [name] exposure  Name: Alexander [name] MRN: [id number]  Date: 10/27/2018  DOB: 07-28-1945  [id number], Lilia Argue, MD  Thea Silversmith, MD   REFERRING PHYSICIAN: Thea Silversmith, MD  DIAGNOSIS: 73 y.o. gentleman with biochemically recurrent prostate cancer with a current PSA of 1.06 s/p RALP in 2006 for pT2cNx, Gleason 3+3 disease     ICD-10-CM   1. Malignant neoplasm of prostate ([place])  C61     HISTORY OF PRESENT ILLNESS: Alexander [name] is a 73 y.o. male with a rising, detectable PSA s/p RALP in 2006 for Gleason 3+3 prostate cancer with a pretreatment PSA of 8.7. His surgery was performed by his urologist, Dr. Jonette Eva, in [facility]. Final surgical pathology from the procedure confirmed Gleason 3+3 disease with extensive HG/PIN but negative margins and no seminal vesicle involvement. No lymph nodes were sampled.  His initial postoperative PSA was undetectable and remained undetectable until 07/2009 when it was noted at 0.03. It has continued to rise very slowly since that time with a PSA of 0.06 in 2013, and up to 0.12 in 03/2012.  Unfortuantely he was lost to follow up for several years until his most recent follow up in 02/2017 with a PSA of 0.6 at that time. The PSA increased to 1.4 in 04/2018, and most recently was noted at 1.06 in 08/2018. He had an Axumin PET scan for further evaluation on 09/17/2018 and this showed no evidence of local prostate recurrence, metastatic lymphadenopathy, soft tissue metastasis, or skeletal metastasis.  The Alexander [name] reviewed the PSA and Axumin PET results with his urologist and was referred to Dr. Pablo Ledger in radiation oncology at [facility] on 10/15/2018 to discuss the role of salvage radiation therapy to the prostate fossa. The Alexander [name] lives and works in [place] and would prefer  to receive treatment closer to home and therefore, has been kindly referred to [place] today to discuss treatment locally. He is accompanied today by his daughter, Alexander [name].   PREVIOUS RADIATION THERAPY: No  PAST MEDICAL HISTORY:  Past Medical History:  Diagnosis Date  . Angio-edema   . Diabetes mellitus without complication ([place])   . Hypertension   . Neuromuscular disorder ([place])   . Peripheral arterial disease ([place]) 11/14/2016   non-occlusive, asymptomatic, seen by vascular surgery Dr. Oneida Alar who rec repeated ABIs annually  . Prostate cancer ([place])       PAST SURGICAL HISTORY: Past Surgical History:  Procedure Laterality Date  . COLON SURGERY     "locked bowel" fixed  . HERNIA REPAIR    . LEFT HEART CATHETERIZATION WITH CORONARY ANGIOGRAM N/A 09/15/2013   Procedure: LEFT HEART CATHETERIZATION WITH CORONARY ANGIOGRAM;  Surgeon: Peter M Martinique, MD;  Location: [facility] CATH LAB;  Service: Cardiovascular;  Laterality: N/A;  . PROSTATE SURGERY  2005    FAMILY HISTORY:  Family History  Problem Relation Age of Onset  . Diabetes Mother   . Depression Father   . Hyperlipidemia Sister   . Breast cancer Neg Hx   . Prostate cancer Neg Hx   . Colon cancer Neg Hx     SOCIAL HISTORY:  Social History   Socioeconomic History  . Marital status: Widowed    Spouse name: Not on file  . Number of children: 2  . Years of education: 9  . Highest education level: Not on file  Occupational History  . Occupation: retired  Scientific laboratory technician  . Financial resource strain: Not on file  . Food insecurity    Worry: Not on file    Inability: Not on file  . Transportation needs    Medical: Not on file    Non-medical: Not on file  Tobacco Use  . Smoking status: Current Every Day Smoker    Packs/day: 1.00    Years: 55.00    Pack years: 55.00    Types: Cigarettes  . Smokeless tobacco: Never Used  Substance and Sexual Activity  . Alcohol use: No  . Drug use: No  . Sexual activity: Yes  Lifestyle  .  Physical activity    Days per week: Not on file    Minutes per session: Not on file  . Stress: Not on file  Relationships  . Social Herbalist on phone: Not on file    Gets together: Not on file    Attends religious service: Not on file    Active member of club or organization: Not on file    Attends meetings of clubs or organizations: Not on file    Relationship status: Not on file  . Intimate partner violence    Fear of current or ex partner: Not on file    Emotionally abused: Not on file    Physically abused: Not on file    Forced sexual activity: Not on file  Other Topics Concern  . Not on file  Social History Narrative   Alexander [name] is single and his daughter lives with him.   Alexander [name] has two children.   Alexander [name] has a 9th grade education.   Alexander [name] works in Architect (part-time).   Alexander [name] drinks one to two cups of coffee daily.   Alexander [name] is right-handed.          ALLERGIES: Lisinopril  MEDICATIONS:  Current Outpatient Medications  Medication Sig Dispense Refill  . amLODipine (NORVASC) 10 MG tablet TAKE 1 TABLET BY MOUTH  DAILY 90 tablet 1  . aspirin EC 81 MG tablet Take 81 mg by mouth daily as needed.     Marland Kitchen atorvastatin (LIPITOR) 40 MG tablet Take 1 tablet (40 mg total) by mouth daily. 90 tablet 1  . diphenhydrAMINE (BENADRYL) 25 MG tablet Take 25 mg by mouth every 6 (six) hours as needed.    Marland Kitchen EPINEPHrine (EPIPEN 2-PAK) 0.3 mg/0.3 mL IJ SOAJ injection Inject 0.3 mLs (0.3 mg total) into the muscle as needed for anaphylaxis. 2 each 2  . fexofenadine (ALLEGRA) 180 MG tablet Take 1 tablet (180 mg total) by mouth daily. (Alexander [name] taking differently: Take 180 mg by mouth daily as needed. ) 30 tablet 5  . fluticasone (FLONASE) 50 MCG/ACT nasal spray Place 2 sprays into both nostrils daily. 16 g 6  . gabapentin (NEURONTIN) 300 MG capsule TAKE 3 CAPSULES BY MOUTH  TWO TIMES DAILY 540 capsule 1  . hydrochlorothiazide (HYDRODIURIL) 25 MG tablet TAKE 1 TABLET BY MOUTH   DAILY 90 tablet 1  . HYDROcodone-acetaminophen (NORCO) 10-325 MG tablet Take 1 tablet by mouth every 6 (six) hours as needed for moderate pain. Dx M54.5, M54.16, G89.29 20 tablet 0  . meloxicam (MOBIC) 7.5 MG tablet Take 1 tablet (7.5 mg total) by mouth daily as needed. for pain 90 tablet 0  . metFORMIN (GLUCOPHAGE) 1000 MG tablet Take 1 tablet (1,000 mg total) by mouth 2 (two) times daily with a meal. 180 tablet 3  . sildenafil (VIAGRA) 100 MG  tablet Take 100 mg by mouth.    Marland Kitchen tiZANidine (ZANAFLEX) 4 MG tablet TAKE 1 TABLET BY MOUTH  EVERY 8 HOURS AS NEEDED FOR MUSCLE SPASMS IN  BACK/LEGS/FEET. 30 tablet 0  . triamcinolone cream (KENALOG) 0.1 % APPLY TOPICALLY TWO TIMES A DAY 45 g 0  . vitamin C (ASCORBIC ACID) 500 MG tablet Take 500 mg daily by mouth.    Marland Kitchen glipiZIDE (GLUCOTROL) 5 MG tablet Take 0.5 tablets (2.5 mg total) by mouth daily before breakfast. (Alexander [name] not taking: Reported on 10/20/2018) 45 tablet 1  . nicotine polacrilex (NICOTINE MINI) 2 MG lozenge Take 1 lozenge (2 mg total) by mouth as needed for smoking cessation. (Alexander [name] not taking: Reported on 10/27/2018) 100 tablet 0  . varenicline (CHANTIX STARTING MONTH PAK) 0.5 MG X 11 & 1 MG X 42 tablet Take one 0.5 mg tablet by mouth once daily for 3 days, then increase to one 0.5 mg tablet twice daily for 4 days, then increase to one 1 mg tablet twice daily. (Alexander [name] not taking: Reported on 10/27/2018) 53 tablet 0   No current facility-administered medications for this encounter.     REVIEW OF SYSTEMS:  On review of systems, the Alexander [name] reports that he is doing well overall. He denies any chest pain, shortness of breath, cough, fevers, chills, night sweats, or unintended weight changes. He denies any bowel disturbances, and denies abdominal pain, nausea or vomiting. He denies any new musculoskeletal or joint aches or pains. He does report chronic low back pain that radiates down his leg associated with degenerative disc disease and notes this  pain is unchanged recently and well managed with his current medication regimen. His IPSS was 12, indicating moderate urinary symptoms. He reports persistent mixed urinary incontinence, present since the time of surgery and requires 2-3 ppd. He has recently started pelvic floor rehab with a PT specialist in [place]. His SHIM was 18 with the aid of Viagra, indicating he has mild erectile dysfunction. A complete review of systems is obtained and is otherwise negative.   PHYSICAL EXAM:  Wt Readings from Last 3 Encounters:  10/27/18 133 lb (60.3 kg)  10/21/18 135 lb 12.8 oz (61.6 kg)  08/10/18 135 lb 9.6 oz (61.5 kg)   Temp Readings from Last 3 Encounters:  10/21/18 97.8 F (36.6 C) (Temporal)  08/10/18 98.1 F (36.7 C) (Temporal)  07/31/18 98.5 F (36.9 C) (Oral)   BP Readings from Last 3 Encounters:  08/10/18 122/76  07/31/18 (!) 146/78  02/05/18 134/85   Pulse Readings from Last 3 Encounters:  08/10/18 78  07/31/18 79  02/05/18 82   Pain Assessment Pain Score: 0-No pain/10  In general this is a well appearing African American gentleman in no acute distress. He's alert and oriented x4 and appropriate throughout the examination. Cardiopulmonary assessment is negative for acute distress and he exhibits normal effort.   KPS = 90  100 - Normal; no complaints; no evidence of disease. 90   - Able to carry on normal activity; minor signs or symptoms of disease. 80   - Normal activity with effort; some signs or symptoms of disease. 31   - Cares for self; unable to carry on normal activity or to do active work. 60   - Requires occasional assistance, but is able to care for most of his personal needs. 50   - Requires considerable assistance and frequent medical care. 64   - Disabled; requires special care and assistance. 30   - Severely disabled;  hospital admission is indicated although death not imminent. 66   - Very sick; hospital admission necessary; active supportive treatment  necessary. 10   - Moribund; fatal processes progressing rapidly. 0     - Dead  Karnofsky DA, Abelmann [employer], Craver LS and Burchenal [employer] [phone]) The use of the nitrogen mustards in the palliative treatment of carcinoma: with particular reference to bronchogenic carcinoma Cancer 1 634-56  LABORATORY DATA:  Lab Results  Component Value Date   WBC 8.6 08/10/2018   HGB 13.5 08/10/2018   HCT 41.2 08/10/2018   MCV 90 08/10/2018   PLT 230 08/10/2018   Lab Results  Component Value Date   NA 140 08/10/2018   K 4.6 08/10/2018   CL 99 08/10/2018   CO2 23 08/10/2018   Lab Results  Component Value Date   ALT 11 08/10/2018   AST 13 08/10/2018   ALKPHOS 110 08/10/2018   BILITOT 0.3 08/10/2018     RADIOGRAPHY: No results found.    IMPRESSION/PLAN:  This visit was conducted via MyChart to spare the Alexander [name] unnecessary potential exposure in the healthcare setting during the current COVID-19 pandemic.  1. 73 y.o. gentleman with biochemically recurrent prostate cancer with a current PSA of 1.06, s/p RALP in 2006 for pT2cNx, Gleason 3+3 disease. Today we reviewed the findings and workup thus far.  We discussed the natural history of prostate cancer.  We reviewed the the implications of positive margins, extracapsular extension, and seminal vesicle involvement on the risk of prostate cancer recurrence. Despite the fact that these were not present at the time of his surgery, we discussed the implications of a rising, detectable postoperative PSA, indicating likely disease recurrence in the setting of previously treated prostate cancer. We discussed the recommendation for a 7.5 week course of daily radiation treatment directed to the prostatic fossa with regard to the logistics and delivery of external beam radiation treatment. He and his daughter were encouraged to ask questions that were answered to their stated satisfaction.  At the end of the conversation the Alexander [name] appears to have a good understanding  of his disease and our recommendations which are of curative intent and he is interested in moving forward with 7.5 weeks of daily salvage prostate fossa radiation. He has just recently started pelvic floor rehab to help aid in regaining his continence and we discussed that he can and should continue his exercises throughout his course of radiation treatment. He has provided verbal consent to proceed today and is scheduled for CT simulation on 11/30/2018 at 9 am to allow him time to make some progress with rehab prior to initiating treatment. We will share our discussion with Dr. [place] Crane and Dr. Pablo Ledger and move forward with treatment planning in anticipation of beginning salvage prostate fossa radiation in the near future.  We enjoyed meeting him and his daughter today and look forward to continuing to participate in the care of this very nice gentleman.  Given current concerns for Alexander [name] exposure during the COVID-19 pandemic, this encounter was conducted via video-enabled MyChart visit. The Alexander [name] has given verbal consent for this type of encounter. The time spent during this encounter was 60 minutes. The attendants for this meeting include Tyler Pita MD, Ashlyn Bruning PA-C, [place], Alexander [name], AlbertMadkins and his daughter. During the encounter, Tyler Pita MD, Ashlyn Bruning PA-C, and scribe, Wilburn Mylar were located at [place].  Alexander [name], Alexander [name] and his daughter were located at home.  Nicholos Johns, PA-C    Tyler Pita, MD  [facility] Direct Dial: [phone]  Fax: [phone] [email or web address]  Skype  [email or web address]  This document serves as a record of services personally performed by Tyler Pita, MD and Freeman Caldron, PA-C. It was created on their behalf by Wilburn Mylar, a trained medical scribe. The creation of this record is based on the scribe's personal  observations and the provider's statements to them. This document has been checked and approved by the attending provider.

## 2018-10-30 ENCOUNTER — Ambulatory Visit: Payer: Medicare Other | Attending: Radiation Oncology | Admitting: Physical Therapy

## 2018-10-30 ENCOUNTER — Encounter: Payer: Self-pay | Admitting: Physical Therapy

## 2018-10-30 ENCOUNTER — Other Ambulatory Visit: Payer: Self-pay

## 2018-10-30 DIAGNOSIS — M6281 Muscle weakness (generalized): Secondary | ICD-10-CM

## 2018-10-30 DIAGNOSIS — C61 Malignant neoplasm of prostate: Secondary | ICD-10-CM | POA: Diagnosis present

## 2018-10-30 DIAGNOSIS — R279 Unspecified lack of coordination: Secondary | ICD-10-CM | POA: Diagnosis not present

## 2018-10-30 NOTE — Therapy (Signed)
[facility] [address] [place], [place], [address] Phone: [phone]   Fax:  [phone]  Physical Therapy Treatment  Patient Details  Name: Alexander [name] MRN: [id number] Date of Birth: 1945-11-14 Referring Provider (PT): Dr. Thea Silversmith   Encounter Date: 10/30/2018  PT End of Session - 10/30/18 0929    Visit Number  2    Date for PT Re-Evaluation  12/15/18    Authorization Type  [employer] medicare    PT Start Time  0845    PT Stop Time  0929    PT Time Calculation (min)  44 min    Activity Tolerance  Patient tolerated treatment well    Behavior During Therapy  [facility] for tasks assessed/performed       Past Medical History:  Diagnosis Date  . Angio-edema   . Diabetes mellitus without complication ([place])   . Hypertension   . Neuromuscular disorder ([place])   . Peripheral arterial disease ([place]) 11/14/2016   non-occlusive, asymptomatic, seen by vascular surgery Dr. Oneida Alar who rec repeated ABIs annually  . Prostate cancer [facility])     Past Surgical History:  Procedure Laterality Date  . COLON SURGERY     "locked bowel" fixed  . HERNIA REPAIR    . LEFT HEART CATHETERIZATION WITH CORONARY ANGIOGRAM N/A 09/15/2013   Procedure: LEFT HEART CATHETERIZATION WITH CORONARY ANGIOGRAM;  Surgeon: Peter M Martinique, MD;  Location[facility] C[facility] LAB;  Service: Cardiovascular;  Laterality: N/A;  . PROSTATE SURGERY  2005    There were no vitals filed for this visit.  Subjective Assessment - 10/30/18 0853    Subjective  no more tests done at this time. No changes snce last visit.    Patient Stated Goals  reduce urinary leakage    Currently in Pain?  No/denies                       [facility] Adult PT Treatment/Exercise - 10/30/18 0001      Neuro Re-ed    Neuro Re-ed Details   diaphragmatic breathing in supine; plevic floor contraction in sitting      Lumbar Exercises: Stretches   Active Hamstring Stretch  Right;Left;1  rep;30 seconds   supine   Double Knee to Chest Stretch  30 seconds;1 rep    Lower Trunk Rotation  2 reps;30 seconds   each side   Prone on Elbows Stretch  1 rep;30 seconds   on two pillows   Piriformis Stretch  Right;Left;1 rep;30 seconds   supine   Other Lumbar Stretch Exercise  supine butterfly stretch for 1 min  pushing knees outward      Manual Therapy   Manual Therapy  Soft tissue mobilization    Soft tissue mobilization  abdomen, diaphragm, along the scar to elongate the tissue             PT Education - 10/30/18 0928    Education Details  Access Code: [email or web address]    Person(s) Educated  Patient    Methods  Explanation;Demonstration;Verbal cues;Handout    Comprehension  Verbalized understanding;Returned demonstration       PT Short Term Goals - 10/30/18 1139      PT SHORT TERM GOAL #1   Title  independent with initial HEP    Time  4    Period  Weeks    Status  On-going      PT SHORT TERM GOAL #2   Title  able to perform  diaphragmatic breathing due to improved mobility of the abdominal and pelvic floor tissue    Time  4    Period  Weeks    Status  On-going    Target Date  11/17/18      PT SHORT TERM GOAL #3   Title  able to hold a pelvic floor contraction for 10 seconds in sidely and 5 quick contractions to strengthen pelvic floor    Time  4    Period  Weeks    Status  On-going      PT SHORT TERM GOAL #4   Title  understand what bladder irritants are and how they affect the bladder    Time  4    Period  Weeks    Status  On-going    Target Date  11/17/18        PT Long Term Goals - 10/20/18 1428      PT LONG TERM GOAL #1   Title  independent with HEP and understand how to advance    Time  8    Period  Weeks    Status  New    Target Date  12/15/18      PT LONG TERM GOAL #2   Title  urinary leakage decreased >/= 70% due to improve pelvic floor strength and holding a contraction for 20 seconds    Time  8    Period  Weeks    Status  New     Target Date  12/15/18      PT LONG TERM GOAL #3   Title  not have to wear a depends pad due to reduction of urinary leakage    Time  8    Period  Weeks    Status  New    Target Date  12/15/18      PT LONG TERM GOAL #4   Title  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 After her manual work the abdominal tissue was softer and able to perform diaphragmatic breathing with VC. Patient has learned ways to stretch the hips. Patient urinary leakage is slightly better. Patient will benefit from skilled therapy to improve pelvic floor strength and endurance to reduce urinary leakage.    Personal Factors and Comorbidities  Age;Comorbidity 2;Comorbidity 3+;Time since onset of injury/illness/exacerbation;Sex    Comorbidities  s/p prostate cancer 2005; Diabetes, Hypertension, s/p hernia repair    Examination-Participation Restrictions  Interpersonal Relationship    Stability/Clinical Decision Making  Evolving/Moderate complexity    Rehab Potential  Excellent    PT Frequency  1x / week    PT Duration  8 weeks    PT Treatment/Interventions  Biofeedback;Therapeutic activities;Therapeutic exercise;Neuromuscular re-education;Patient/family education;Dry needling;Scar mobilization;Manual techniques;Spinal Manipulations    PT Next Visit Plan  education on soft tissue to the perineum, abdominal soft tissue work, pelvic floor contraction; discuss bladder irritants, pelvic floor EMG    PT Home Exercise Plan  Access Code: [email or web address]    Consulted and Agree with Plan of Care  Patient       Patient will benefit from skilled therapeutic intervention in order to  improve the following deficits and impairments:  Decreased coordination, Increased fascial  restricitons, Decreased activity tolerance, Decreased scar mobility, Decreased strength, Postural dysfunction  Visit Diagnosis: Muscle weakness (generalized)  Unspecified lack of coordination  Prostate cancer [facility])     Problem List Patient Active Problem List   Diagnosis Date Noted  . Malignant neoplasm of prostate ([place]) 10/27/2018  . Allergic reaction 08/10/2018  . Angioedema 08/10/2018  . Peripheral arterial occlusive disease ([place]) 10/28/2016  . Hyperlipidemia LDL goal <70 05/01/2015  . Tobacco use disorder 12/09/2013  . 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 Erectile dysfunction due to diseases classified elsewhere 12/09/2013  . Type 2 diabetes mellitus with other circulatory complications ([place]) [id number]  . Left lumbar radiculopathy 09/30/2012  . Chronic rhinitis 04/08/2011  . Hypertension   . HTN (hypertension), benign 03/09/2011  . History of prostate cancer 03/09/2011    Earlie Counts, PT 10/30/18 11:41 AM   [facility] [address] [place], [place], [address] Phone: [phone]   Fax:  [phone]  Name: Alexander [name] MRN: [id number] Date of Birth: 1945/08/19

## 2018-10-30 NOTE — Patient Instructions (Signed)
Access Code: [email or web address]  URL: [email or web address]  Date: 10/30/2018  Prepared by: Earlie Counts   Exercises Supine Figure 4 Piriformis Stretch - 2 reps - 1 sets - 30 sec hold - 1x daily - 7x weekly Supine Hamstring Stretch - 2 reps - 1 sets - 30 sec hold - 1x daily - 7x weekly Supine Butterfly Groin Stretch - 1 reps - 1 sets - 1 min hold - 1x daily - 7x weekly Supine Lower Trunk Rotation - 2 reps - 1 sets - 30 sec hold - 1x daily - 7x weekly Supine Double Knee to Chest - 2 reps - 1 sets - 30 sec hold - 1x daily - 7x weekly Prone on Elbows Stretch - 2 reps - 1 sets - 1x daily - 7x weekly Reclined Diaphragmatic Breathing - 10 reps - 1 sets - 2-3x daily - 7x weekly Seated Pelvic Floor Contraction - 10 reps - 1 sets - 5 sec hold - 3x daily - 7x weekly [facility] [address] [place], [place] [address] Phone # [phone] Fax [phone]

## 2018-11-02 ENCOUNTER — Ambulatory Visit (INDEPENDENT_AMBULATORY_CARE_PROVIDER_SITE_OTHER): Payer: Medicare Other | Admitting: Family Medicine

## 2018-11-02 ENCOUNTER — Encounter: Payer: Self-pay | Admitting: Family Medicine

## 2018-11-02 ENCOUNTER — Ambulatory Visit: Payer: Medicare Other | Admitting: Emergency Medicine

## 2018-11-02 ENCOUNTER — Other Ambulatory Visit: Payer: Self-pay

## 2018-11-02 VITALS — BP 110/84 | HR 78 | Temp 98.2°F | Ht 65.0 in | Wt 136.0 lb

## 2018-11-02 DIAGNOSIS — E1159 Type 2 diabetes mellitus with other circulatory complications: Secondary | ICD-10-CM | POA: Diagnosis not present

## 2018-11-02 DIAGNOSIS — I10 Essential (primary) hypertension: Secondary | ICD-10-CM

## 2018-11-02 DIAGNOSIS — E785 Hyperlipidemia, unspecified: Secondary | ICD-10-CM | POA: Diagnosis not present

## 2018-11-02 LAB — CMP14+EGFR
ALT: 19 IU/L (ref 0–44)
AST: 17 IU/L (ref 0–40)
Albumin/Globulin Ratio: 1.6 (ref 1.2–2.2)
Albumin: 4.6 g/dL (ref 3.7–4.7)
Alkaline Phosphatase: 112 IU/L (ref 39–117)
BUN/Creatinine Ratio: 18 (ref 10–24)
BUN: 19 mg/dL (ref 8–27)
Bilirubin Total: 0.3 mg/dL (ref 0.0–1.2)
CO2: 22 mmol/L (ref 20–29)
Calcium: 10.2 mg/dL (ref 8.6–10.2)
Chloride: 104 mmol/L (ref 96–106)
Creatinine, Ser: 1.04 mg/dL (ref 0.76–1.27)
GFR calc Af Amer: 83 mL/min/{1.73_m2} (ref >59)
GFR calc non Af Amer: 71 mL/min/{1.73_m2} (ref >59)
Globulin, Total: 2.9 g/dL (ref 1.5–4.5)
Glucose: 122 mg/dL — ABNORMAL HIGH (ref 65–99)
Potassium: 4.1 mmol/L (ref 3.5–5.2)
Sodium: 138 mmol/L (ref 134–144)
Total Protein: 7.5 g/dL (ref 6.0–8.5)

## 2018-11-02 LAB — LIPID PANEL
Chol/HDL Ratio: 2.7 ratio (ref 0.0–5.0)
Cholesterol, Total: 93 mg/dL — ABNORMAL LOW (ref 100–199)
HDL: 34 mg/dL — ABNORMAL LOW (ref >39)
LDL Chol Calc (NIH): 47 mg/dL (ref 0–99)
Triglycerides: 49 mg/dL (ref 0–149)
VLDL Cholesterol Cal: 12 mg/dL (ref 5–40)

## 2018-11-02 LAB — POCT GLYCOSYLATED HEMOGLOBIN (HGB A1C): Hemoglobin A1C: 7.2 % — AB (ref 4.0–5.6)

## 2018-11-02 MED ORDER — GLIPIZIDE 5 MG PO TABS: 5.0000 mg | ORAL_TABLET | Freq: Two times a day (BID) | ORAL | 1 refills | Status: DC

## 2018-11-02 MED ORDER — GLIPIZIDE 5 MG PO TABS: 5.0000 mg | ORAL_TABLET | Freq: Every day | ORAL | 1 refills | Status: DC

## 2018-11-02 NOTE — Patient Instructions (Signed)
° ° ° °  If you have lab work done today you will be contacted with your lab results within the next 2 weeks.  If you have not heard from us then please contact us. The fastest way to get your results is to register for My Chart. ° ° °IF you received an x-ray today, you will receive an invoice from [facility]. Please contact [facility] at [phone] with questions or concerns regarding your invoice.  ° °IF you received labwork today, you will receive an invoice from [facility]. Please contact [facility] at [phone] with questions or concerns regarding your invoice.  ° °Our billing staff will not be able to assist you with questions regarding bills from these companies. ° °You will be contacted with the lab results as soon as they are available. The fastest way to get your results is to activate your My Chart account. Instructions are located on the last page of this paperwork. If you have not heard from us regarding the results in 2 weeks, please contact this office. °  ° ° ° °

## 2018-11-02 NOTE — Progress Notes (Signed)
10/12/20208:17 AM  Alexander [name] 12-02-1945, 73 y.o., male [id number]  Chief Complaint  Patient presents with  . Follow-up    chronic cond, wants to talk about the metformin recall    HPI:   Patient is a 73 y.o. male with past medical history significant for DM2, HTN, DDD lumbar spine, PAD, prostate cancer who presents today for routine folllowup  Last OV July 2020 Started low dose glipizide Recurrence of prostate cancer based on psa, no mets on ct, starting radiation Saw pulm - started chantix  He is overall doing well He has been taking 27m of glipizide (instead of rx 2.562m with breakfast - tolerating well  Has not started radiation therapy yet  Takes vicodin prn pmp reviewed  Has not started chantix yet Declines flu vaccine  Has no acute concerns  Lab Results  Component Value Date   HGBA1C 7.4 (H) 07/31/2018   HGBA1C 7.1 (H) 04/27/2018   HGBA1C 5.9 (A) 12/16/2017   Lab Results  Component Value Date   MICROALBUR 3.2 04/20/2015   LDLCALC 43 07/31/2018   CREATININE 1.29 (H) 08/10/2018    Depression screen PHQ 2/9 11/02/2018 07/31/2018 04/30/2018  Decreased Interest 0 0 0  Down, Depressed, Hopeless 0 0 0  PHQ - 2 Score 0 0 0    Fall Risk  11/02/2018 07/31/2018 04/30/2018 02/05/2018 12/16/2017  Falls in the past year? 0 0 0 0 0  Number falls in past yr: 0 0 0 - -  Injury with Fall? 0 0 0 - -     Allergies  Allergen Reactions  . Lisinopril Swelling    angioedema    Prior to Admission medications   Medication Sig Start Date End Date Taking? Authorizing Provider  amLODipine (NORVASC) 10 MG tablet TAKE 1 TABLET BY MOUTH  DAILY 11/26/17  Yes ShShawnee KnappMD  aspirin EC 81 MG tablet Take 81 mg by mouth daily as needed.    Yes [provider]  atorvastatin (LIPITOR) 40 MG tablet Take 1 tablet (40 mg total) by mouth daily. 12/20/17  Yes ShShawnee KnappMD  diphenhydrAMINE (BENADRYL) 25 MG tablet Take 25 mg by mouth every 6 (six) hours as needed.   Yes  [provider]  EPINEPHrine (EPIPEN 2-PAK) 0.3 mg/0.3 mL IJ SOAJ injection Inject 0.3 mLs (0.3 mg total) into the muscle as needed for anaphylaxis. 08/10/18  Yes Bobbitt, RaSedalia MutaMD  fexofenadine (ALLEGRA) 180 MG tablet Take 1 tablet (180 mg total) by mouth daily. Patient taking differently: Take 180 mg by mouth daily as needed.  08/10/18  Yes Bobbitt, RaSedalia MutaMD  fluticasone ([facility] M[facility]/ACT nasal spray Place 2 sprays into both nostrils daily. 08/10/18  Yes Bobbitt, RaSedalia MutaMD  gabapentin (NEURONTIN) 300 MG capsule TAKE 3 CAPSULES BY MOUTH  TWO TIMES DAILY 05/05/18  Yes ShShawnee KnappMD  glipiZIDE (GLUCOTROL) 5 MG tablet Take 0.5 tablets (2.5 mg total) by mouth daily before breakfast. 08/01/18  Yes SaRutherford GuysMD  hydrochlorothiazide (HYDRODIURIL) 25 MG tablet TAKE 1 TABLET BY MOUTH  DAILY 11/26/17  Yes ShShawnee KnappMD  HYDROcodone-acetaminophen (NORCO) 10-325 MG tablet Take 1 tablet by mouth every 6 (six) hours as needed for moderate pain. Dx M54.5, M54.16, G89.29 10/15/18  Yes SaRutherford GuysMD  meloxicam (MOBIC) 7.5 MG tablet Take 1 tablet (7.5 mg total) by mouth daily as needed. for pain 08/14/18  Yes SaRutherford GuysMD  metFORMIN (GLUCOPHAGE) 1000 MG tablet Take  1 tablet (1,000 mg total) by mouth 2 (two) times daily with a meal. 07/07/17  Yes Shawnee Knapp, MD  nicotine polacrilex (NICOTINE MINI) 2 MG lozenge Take 1 lozenge (2 mg total) by mouth as needed for smoking cessation. 10/21/18  Yes Olalere, Adewale A, MD  sildenafil (VIAGRA) 100 MG tablet Take 100 mg by mouth.   Yes [provider]  tiZANidine (ZANAFLEX) 4 MG tablet TAKE 1 TABLET BY MOUTH  EVERY 8 HOURS AS NEEDED FOR MUSCLE SPASMS IN  BACK/LEGS/FEET. 02/05/18  Yes Rutherford Guys, MD  triamcinolone cream (KENALOG) 0.1 % APPLY TOPICALLY TWO TIMES A DAY 11/26/17  Yes Shawnee Knapp, MD  varenicline (CHANTIX STARTING MONTH PAK) 0.5 MG X 11 & 1 MG X 42 tablet Take one 0.5 mg tablet by mouth once daily for  3 days, then increase to one 0.5 mg tablet twice daily for 4 days, then increase to one 1 mg tablet twice daily. 10/21/18  Yes Olalere, Adewale A, MD  vitamin C (ASCORBIC ACID) 500 MG tablet Take 500 mg daily by mouth.   Yes [provider]    Past Medical History:  Diagnosis Date  . Angio-edema   . Diabetes mellitus without complication ([place])   . Hypertension   . Neuromuscular disorder ([place])   . Peripheral arterial disease ([place]) 11/14/2016   non-occlusive, asymptomatic, seen by vascular surgery Dr. Oneida Alar who rec repeated ABIs annually  . Prostate cancer [facility])     Past Surgical History:  Procedure Laterality Date  . COLON SURGERY     "locked bowel" fixed  . HERNIA REPAIR    . LEFT HEART CATHETERIZATION WITH CORONARY ANGIOGRAM N/A 09/15/2013   Procedure: LEFT HEART CATHETERIZATION WITH CORONARY ANGIOGRAM;  Surgeon: Peter M Martinique, MD;  Location: [facility] CATH LAB;  Service: Cardiovascular;  Laterality: N/A;  . PROSTATE SURGERY  2005    Social History   Tobacco Use  . Smoking status: Current Every Day Smoker    Packs/day: 1.00    Years: 55.00    Pack years: 55.00    Types: Cigarettes  . Smokeless tobacco: Never Used  Substance Use Topics  . Alcohol use: No    Family History  Problem Relation Age of Onset  . Diabetes Mother   . Depression Father   . Hyperlipidemia Sister   . Breast cancer Neg Hx   . Prostate cancer Neg Hx   . Colon cancer Neg Hx     Review of Systems  Constitutional: Negative for chills and fever.  Respiratory: Negative for cough and shortness of breath.   Cardiovascular: Negative for chest pain, palpitations and leg swelling.  Gastrointestinal: Negative for abdominal pain, nausea and vomiting.     OBJECTIVE:  Today's Vitals   11/02/18 0813 11/02/18 0816  BP: (!) 147/82 110/84  Pulse: 78   Temp: 98.2 F (36.8 C)   SpO2: 96%   Weight: 136 lb (61.7 kg)   Height: 5' 5" (1.651 m)    Body mass index is 22.63 kg/m.   Physical Exam  Vitals signs and nursing note reviewed.  Constitutional:      Appearance: He is well-developed.  HENT:     Head: Normocephalic and atraumatic.  Eyes:     Conjunctiva/sclera: Conjunctivae normal.     Pupils: Pupils are equal, round, and reactive to light.  Neck:     Musculoskeletal: Neck supple.  Cardiovascular:     Rate and Rhythm: Normal rate and regular rhythm.     Heart  sounds: No murmur. No friction rub. No gallop.   Pulmonary:     Effort: Pulmonary effort is normal.     Breath sounds: Normal breath sounds. No wheezing or rales.  Skin:    General: Skin is warm and dry.  Neurological:     Mental Status: He is alert and oriented to person, place, and time.     Results for orders placed or performed in visit on 11/02/18 (from the past 24 hour(s))  POCT glycosylated hemoglobin (Hb A1C)     Status: Abnormal   Collection Time: 11/02/18  8:51 AM  Result Value Ref Range   Hemoglobin A1C 7.2 (A) 4.0 - 5.6 %   HbA1c POC (<> result, manual entry)     HbA1c, POC (prediabetic range)     HbA1c, POC (controlled diabetic range)      No results found.   ASSESSMENT and PLAN  1. Type 2 diabetes mellitus with other circulatory complications (HCC) Improved. Increase glipizide to BID  - POCT glycosylated hemoglobin (Hb A1C) - Lipid panel - CMP14+EGFR - Microalbumin / creatinine urine ratio  2. Essential hypertension Controlled. Continue current regime.  - Lipid panel - CMP14+EGFR  3. Hyperlipidemia LDL goal <70 Controlled. Continue current regime.  - Lipid panel - CMP14+EGFR  Return in about 6 months (around 05/03/2019).    Rutherford Guys, MD Primary Care at [place], [place] [address] Ph.  [phone] Fax [phone]

## 2018-11-03 ENCOUNTER — Encounter: Payer: Self-pay | Admitting: Medical Oncology

## 2018-11-03 LAB — MICROALBUMIN / CREATININE URINE RATIO
Creatinine, Urine: 169 mg/dL
Microalb/Creat Ratio: 7 mg/g creat (ref 0–29)
Microalbumin, Urine: 12 ug/mL

## 2018-11-04 ENCOUNTER — Ambulatory Visit: Payer: Medicare Other | Admitting: Physical Therapy

## 2018-11-04 MED FILL — CHANTIX STARTING MONTH BOX: 0.5 MG X 11 | 28 days supply | Qty: 53 | Fill #0

## 2018-11-06 ENCOUNTER — Telehealth: Payer: Self-pay | Admitting: Medical Oncology

## 2018-11-06 NOTE — Telephone Encounter (Signed)
Spoke with patient and his daughter, to introduce myself as the prostate nurse navigator and discuss my role. They state the video visit went well and he has chosen 7 1/2 weeks of radiation. He underwent robotic prostatectomy in 2006 and now with recurrent disease. He is scheduled for  CT simulation 11/9 at 9:00 am. I gave them my office number and asked them to call me with questions or concerns. He voiced understanding.

## 2018-11-11 ENCOUNTER — Ambulatory Visit (INDEPENDENT_AMBULATORY_CARE_PROVIDER_SITE_OTHER): Payer: Medicare Other | Admitting: Family Medicine

## 2018-11-11 VITALS — BP 110/84 | Ht 65.0 in | Wt 136.0 lb

## 2018-11-11 DIAGNOSIS — Z0000 Encounter for general adult medical examination without abnormal findings: Secondary | ICD-10-CM

## 2018-11-11 NOTE — Progress Notes (Signed)
Presents today for [employer] Visit   Date of last exam: 11/02/2018  Interpreter used for this visit?  No  I connected with  Alexander [name] on 11/11/18 by a telephone enabled telemedicine application and verified that I am speaking with the correct person using two identifiers.   I discussed the limitations of evaluation and management by telemedicine. The patient expressed understanding and agreed to proceed.   Patient Care Team: Rutherford Guys, MD as PCP - General (Family Medicine) Carol Ada, MD as Consulting Physician (Gastroenterology) Warden Fillers, MD as Consulting Physician (Ophthalmology) Pamala Hurry, MD as Referring Physician (Urology) Cira Rue, RN Nurse Navigator as Registered Nurse (Medical Oncology)   Other items to address today:   Discussed immunizations Discussed Eye/Dental   Other Screening: Last screening for diabetes: 07/31/2018 Last lipid screening:11/02/2018  ADVANCE DIRECTIVES: Discussed yes On File: no copy requested Materials Provided: no   Immunization status:  Immunization History  Administered Date(s) Administered  . Td 07/17/2014     There are no preventive care reminders to display for this patient.   Functional Status Survey: Is the patient deaf or have difficulty hearing?: No Does the patient have difficulty seeing, even when wearing glasses/contacts?: No Does the patient have difficulty concentrating, remembering, or making decisions?: No Does the patient have difficulty walking or climbing stairs?: No Does the patient have difficulty dressing or bathing?: No Does the patient have difficulty doing errands alone such as visiting a doctor's office or shopping?: No   6CIT Screen 11/11/2018 10/28/2016  What Year? 0 points 0 points  What month? 0 points 0 points  What time? 0 points 0 points  Count back from 20 0 points 0 points  Months in reverse 0 points 4 points  Repeat phrase 0 points 2  points  Total Score 0 6        Clinical Support from 11/11/2018 in [facility]  AUDIT-C Score  0       Home Environment:   Lives one story No trouble climbing stairs No scattered rugs No grab bars Adequate lighting/no clutter   Patient Active Problem List   Diagnosis Date Noted  . Malignant neoplasm of prostate ([place]) 10/27/2018  . Allergic reaction 08/10/2018  . Angioedema 08/10/2018  . Peripheral arterial occlusive disease ([place]) 10/28/2016  . Hyperlipidemia LDL goal <70 05/01/2015  . Tobacco use disorder 12/09/2013  . 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 Erectile dysfunction due to diseases classified elsewhere 12/09/2013  . Type 2 diabetes mellitus with other circulatory complications ([place]) [id number]  . Left lumbar radiculopathy 09/30/2012  . Chronic rhinitis 04/08/2011  . Hypertension   . HTN (hypertension), benign 03/09/2011  . History of prostate cancer 03/09/2011     Past Medical History:  Diagnosis Date  . Angio-edema   . Diabetes mellitus without complication ([place])   . Hypertension   . Neuromuscular disorder ([place])   . Peripheral arterial disease ([place]) 11/14/2016   non-occlusive, asymptomatic, seen by vascular surgery Dr. Oneida Alar who rec repeated ABIs annually  . Prostate cancer [facility])      Past Surgical History:  Procedure Laterality Date  . COLON SURGERY     "locked bowel" fixed  . HERNIA REPAIR    . LEFT HEART CATHETERIZATION WITH CORONARY ANGIOGRAM N/A 09/15/2013   Procedure: LEFT HEART CATHETERIZATION WITH CORONARY ANGIOGRAM;  Surgeon: Peter M Martinique, MD;  Location: [facility] CATH LAB;  Service:  Cardiovascular;  Laterality: N/A;  . PROSTATE SURGERY  2005     Family History  Problem Relation Age of Onset  . Diabetes Mother   . Depression Father   . Hyperlipidemia Sister   . Breast cancer Neg Hx   . Prostate cancer Neg Hx   . Colon cancer Neg Hx      Social  History   Socioeconomic History  . Marital status: Widowed    Spouse name: Not on file  . Number of children: 2  . Years of education: 9  . Highest education level: Not on file  Occupational History  . Occupation: retired  Scientific laboratory technician  . Financial resource strain: Not on file  . Food insecurity    Worry: Not on file    Inability: Not on file  . Transportation needs    Medical: Not on file    Non-medical: Not on file  Tobacco Use  . Smoking status: Current Every Day Smoker    Packs/day: 1.00    Years: 55.00    Pack years: 55.00    Types: Cigarettes  . Smokeless tobacco: Never Used  Substance and Sexual Activity  . Alcohol use: No  . Drug use: No  . Sexual activity: Yes  Lifestyle  . Physical activity    Days per week: Not on file    Minutes per session: Not on file  . Stress: Not on file  Relationships  . Social Herbalist on phone: Not on file    Gets together: Not on file    Attends religious service: Not on file    Active member of club or organization: Not on file    Attends meetings of clubs or organizations: Not on file    Relationship status: Not on file  . Intimate partner violence    Fear of current or ex partner: Not on file    Emotionally abused: Not on file    Physically abused: Not on file    Forced sexual activity: Not on file  Other Topics Concern  . Not on file  Social History Narrative   Patient is single and his daughter lives with him.   Patient has two children.   Patient has a 9th grade education.   Patient works in Architect (part-time).   Patient drinks one to two cups of coffee daily.   Patient is right-handed.           Allergies  Allergen Reactions  . Lisinopril Swelling    angioedema     Prior to Admission medications   Medication Sig Start Date End Date Taking? Authorizing Provider  amLODipine (NORVASC) 10 MG tablet TAKE 1 TABLET BY MOUTH  DAILY 11/26/17  Yes Shawnee Knapp, MD  aspirin EC 81 MG tablet Take 81  mg by mouth daily as needed.    Yes [provider]  atorvastatin (LIPITOR) 40 MG tablet Take 1 tablet (40 mg total) by mouth daily. 12/20/17  Yes Shawnee Knapp, MD  diphenhydrAMINE (BENADRYL) 25 MG tablet Take 25 mg by mouth every 6 (six) hours as needed.   Yes [provider]  EPINEPHrine (EPIPEN 2-PAK) 0.3 mg/0.3 mL IJ SOAJ injection Inject 0.3 mLs (0.3 mg total) into the muscle as needed for anaphylaxis. 08/10/18  Yes Bobbitt, Sed[place] Muta, MD  fexofenadine (ALLEGRA) 180 MG tablet Take 1 tablet (180 mg total) by mouth daily. Patient taking differently: Take 180 mg by mouth daily as needed.  08/10/18  Yes Bobbitt,  Sedalia Muta, MD  fluticasone [facility]) 50 MCG/ACT nasal spray Place 2 sprays into both nostrils daily. 08/10/18  Yes Bobbitt, Sedalia Muta, MD  gabapentin (NEURONTIN) 300 MG capsule TAKE 3 CAPSULES BY MOUTH  TWO TIMES DAILY 05/05/18  Yes Shawnee Knapp, MD  glipiZIDE (GLUCOTROL) 5 MG tablet Take 1 tablet (5 mg total) by mouth 2 (two) times daily before a meal. 11/02/18  Yes Rutherford Guys, MD  hydrochlorothiazide (HYDRODIURIL) 25 MG tablet TAKE 1 TABLET BY MOUTH  DAILY 11/26/17  Yes Shawnee Knapp, MD  HYDROcodone-acetaminophen (NORCO) 10-325 MG tablet Take 1 tablet by mouth every 6 (six) hours as needed for moderate pain. Dx M54.5, M54.16, G89.29 10/15/18  Yes Rutherford Guys, MD  meloxicam (MOBIC) 7.5 MG tablet Take 1 tablet (7.5 mg total) by mouth daily as needed. for pain 08/14/18  Yes Rutherford Guys, MD  metFORMIN (GLUCOPHAGE) 1000 MG tablet Take 1 tablet (1,000 mg total) by mouth 2 (two) times daily with a meal. 07/07/17  Yes Shawnee Knapp, MD  sildenafil (VIAGRA) 100 MG tablet Take 100 mg by mouth.   Yes [provider]  tiZANidine (ZANAFLEX) 4 MG tablet TAKE 1 TABLET BY MOUTH  EVERY 8 HOURS AS NEEDED FOR MUSCLE SPASMS IN  BACK/LEGS/FEET. 02/05/18  Yes Rutherford Guys, MD  triamcinolone cream (KENALOG) 0.1 % APPLY TOPICALLY TWO TIMES A DAY 11/26/17  Yes Shawnee Knapp,  MD  varenicline (CHANTIX STARTING MONTH PAK) 0.5 MG X 11 & 1 MG X 42 tablet Take one 0.5 mg tablet by mouth once daily for 3 days, then increase to one 0.5 mg tablet twice daily for 4 days, then increase to one 1 mg tablet twice daily. 10/21/18  Yes Olalere, Adewale A, MD  vitamin C (ASCORBIC ACID) 500 MG tablet Take 500 mg daily by mouth.   Yes [provider]  nicotine polacrilex (NICOTINE MINI) 2 MG lozenge Take 1 lozenge (2 mg total) by mouth as needed for smoking cessation. Patient not taking: Reported on 11/11/2018 10/21/18   Laurin Coder, MD     Depression screen [facility] 2/9 11/11/2018 11/02/2018 07/31/2018 04/30/2018 02/05/2018  Decreased Interest 0 0 0 0 0  Down, Depressed, Hopeless 0 0 0 0 0  PHQ - 2 Score 0 0 0 0 0     Fall Risk  11/11/2018 11/02/2018 07/31/2018 04/30/2018 02/05/2018  Falls in the past year? 0 0 0 0 0  Number falls in past yr: 0 0 0 0 -  Injury with Fall? 0 0 0 0 -  Follow up Falls evaluation completed;Education provided;Falls prevention discussed - - - -      PHYSICAL EXAM: BP 110/84 Comment: taken from previous visit  Ht 5\' 5"  (1.651 m)   Wt 136 lb (61.7 kg)   BMI 22.63 kg/m    Wt Readings from Last 3 Encounters:  11/11/18 136 lb (61.7 kg)  11/02/18 136 lb (61.7 kg)  10/27/18 133 lb (60.3 kg)   Medicare annual wellness visit, subsequent    Education/Counseling provided regarding diet and exercise, prevention of chronic diseases, smoking/tobacco cessation, if applicable, and reviewed "Covered Medicare Preventive Services."

## 2018-11-11 NOTE — Patient Instructions (Signed)
Thank you for taking time to come for your Medicare Wellness Visit. I appreciate your ongoing commitment to your health goals. Please review the following plan we discussed and let me know if I can assist you in the future.  Alexander Kennedy LPN  Preventive Care 72 Years and Older, Male Preventive care refers to lifestyle choices and visits with your health care provider that can promote health and wellness. This includes:  A yearly physical exam. This is also called an annual well check.  Regular dental and eye exams.  Immunizations.  Screening for certain conditions.  Healthy lifestyle choices, such as diet and exercise. What can I expect for my preventive care visit? Physical exam Your health care provider will check:  Height and weight. These may be used to calculate body mass index (BMI), which is a measurement that tells if you are at a healthy weight.  Heart rate and blood pressure.  Your skin for abnormal spots. Counseling Your health care provider may ask you questions about:  Alcohol, tobacco, and drug use.  Emotional well-being.  Home and relationship well-being.  Sexual activity.  Eating habits.  History of falls.  Memory and ability to understand (cognition).  Work and work Statistician. What immunizations do I need?  Influenza (flu) vaccine  This is recommended every year. Tetanus, diphtheria, and pertussis (Tdap) vaccine  You may need a Td booster every 10 years. Varicella (chickenpox) vaccine  You may need this vaccine if you have not already been vaccinated. Zoster (shingles) vaccine  You may need this after age 66. Pneumococcal conjugate (PCV13) vaccine  One dose is recommended after age 84. Pneumococcal polysaccharide (PPSV23) vaccine  One dose is recommended after age 88. Measles, mumps, and rubella (MMR) vaccine  You may need at least one dose of MMR if you were born in 1957 or later. You may also need a second dose. Meningococcal  conjugate (MenACWY) vaccine  You may need this if you have certain conditions. Hepatitis A vaccine  You may need this if you have certain conditions or if you travel or work in places where you may be exposed to hepatitis A. Hepatitis B vaccine  You may need this if you have certain conditions or if you travel or work in places where you may be exposed to hepatitis B. Haemophilus influenzae type b (Hib) vaccine  You may need this if you have certain conditions. You may receive vaccines as individual doses or as more than one vaccine together in one shot (combination vaccines). Talk with your health care provider about the risks and benefits of combination vaccines. What tests do I need? Blood tests  Lipid and cholesterol levels. These may be checked every 5 years, or more frequently depending on your overall health.  Hepatitis C test.  Hepatitis B test. Screening  Lung cancer screening. You may have this screening every year starting at age 24 if you have a 30-pack-year history of smoking and currently smoke or have quit within the past 15 years.  Colorectal cancer screening. All adults should have this screening starting at age 52 and continuing until age 61. Your health care provider may recommend screening at age 57 if you are at increased risk. You will have tests every 1-10 years, depending on your results and the type of screening test.  Prostate cancer screening. Recommendations will vary depending on your family history and other risks.  Diabetes screening. This is done by checking your blood sugar (glucose) after you have not eaten for  a while (fasting). You may have this done every 1-3 years.  Abdominal aortic aneurysm (AAA) screening. You may need this if you are a current or former smoker.  Sexually transmitted disease (STD) testing. Follow these instructions at home: Eating and drinking  Eat a diet that includes fresh fruits and vegetables, whole grains, lean  protein, and low-fat dairy products. Limit your intake of foods with high amounts of sugar, saturated fats, and salt.  Take vitamin and mineral supplements as recommended by your health care provider.  Do not drink alcohol if your health care provider tells you not to drink.  If you drink alcohol: ? Limit how much you have to 0-2 drinks a day. ? Be aware of how much alcohol is in your drink. In the [place] one drink equals one 12 oz bottle of beer (355 mL), one 5 oz glass of wine (148 mL), or one 1 oz glass of hard liquor (44 mL). Lifestyle  Take daily care of your teeth and gums.  Stay active. Exercise for at least 30 minutes on 5 or more days each week.  Do not use any products that contain nicotine or tobacco, such as cigarettes, e-cigarettes, and chewing tobacco. If you need help quitting, ask your health care provider.  If you are sexually active, practice safe sex. Use a condom or other form of protection to prevent STIs (sexually transmitted infections).  Talk with your health care provider about taking a low-dose aspirin or statin. What's next?  Visit your health care provider once a year for a well check visit.  Ask your health care provider how often you should have your eyes and teeth checked.  Stay up to date on all vaccines. This information is not intended to replace advice given to you by your health care provider. Make sure you discuss any questions you have with your health care provider. Document Released: 02/03/2015 Document Revised: 01/01/2018 Document Reviewed: 01/01/2018 Elsevier Patient Education  2020 [employer].

## 2018-11-12 ENCOUNTER — Ambulatory Visit: Payer: Medicare Other | Admitting: Physical Therapy

## 2018-11-12 ENCOUNTER — Other Ambulatory Visit: Payer: Self-pay

## 2018-11-12 ENCOUNTER — Encounter: Payer: Self-pay | Admitting: Physical Therapy

## 2018-11-12 DIAGNOSIS — R279 Unspecified lack of coordination: Secondary | ICD-10-CM | POA: Diagnosis not present

## 2018-11-12 DIAGNOSIS — M6281 Muscle weakness (generalized): Secondary | ICD-10-CM | POA: Diagnosis not present

## 2018-11-12 DIAGNOSIS — C61 Malignant neoplasm of prostate: Secondary | ICD-10-CM

## 2018-11-12 NOTE — Patient Instructions (Addendum)
Certain foods and liquids will decrease the pH making the urine more acidic.  Urinary urgency increases when the urine has a low pH.  Most common irritants: alcohol, carbonated beverages and caffinated beverages.  Foods to avoid: apple juice, apples, ascorbic acid, canteloupes, chili, citrus fruits, coffee, cranberries, grapes, guava, peaches, pepper, pineapple, plums, strawberries, tea, tomatoes, and vinegar.  Drinking plenty of water may help to increase the pH and dilute out any of the effects of specific irritants.  Foods that are NOT irritating to the bladder include: Pears, papayas, sun-brewed teas, watermelons, non-citrus herbal teas, apricots, kava and low-acid instant drinks (Postum)  Access Code: [email or web address]  URL: [email or web address]  Date: 11/12/2018  Prepared by: Earlie Counts   Exercises Supine Figure 4 Piriformis Stretch - 2 reps - 1 sets - 30 sec hold - 1x daily - 7x weekly Supine Hamstring Stretch - 2 reps - 1 sets - 30 sec hold - 1x daily - 7x weekly Supine Butterfly Groin Stretch - 1 reps - 1 sets - 1 min hold - 1x daily - 7x weekly Supine Lower Trunk Rotation - 2 reps - 1 sets - 30 sec hold - 1x daily - 7x weekly Supine Double Knee to Chest - 2 reps - 1 sets - 30 sec hold - 1x daily - 7x weekly Prone on Elbows Stretch - 2 reps - 1 sets - 1x daily - 7x weekly Reclined Diaphragmatic Breathing - 10 reps - 1 sets - 2-3x daily - 7x weekly Seated Pelvic Floor Contraction - 10 reps - 1 sets - 8 sec hold - 3x daily - 7x weekly Prone Hip Extension with Pillow Under Abdomen - 10 reps - 1 sets - 1x daily - 7x weekly Supine Bridge with Mini Swiss Ball Between Knees - 15 reps - 1 sets - 1x daily - 7x weekly  [facility] [address] [place], [place] [address] Phone # [phone] Fax [phone]

## 2018-11-12 NOTE — Therapy (Signed)
[facility] [address], [place], [place], [address] Phone: [phone]   Fax:  [phone]  Physical Therapy Treatment  Patient Details  Name: Alexander [name] MRN: [id number] Date of Birth: 10-16-1945 Referring Provider (PT): Dr. Thea Silversmith   Encounter Date: 11/12/2018  PT End of Session - 11/12/18 0842    Visit Number  3    Authorization Type  [employer] medicare    PT Start Time  0800    PT Stop Time  0840    PT Time Calculation (min)  40 min    Activity Tolerance  Patient tolerated treatment well    Behavior During Therapy  [facility] for tasks assessed/performed       Past Medical History:  Diagnosis Date  . Angio-edema   . Diabetes mellitus without complication ([place])   . Hypertension   . Neuromuscular disorder ([place])   . Peripheral arterial disease ([place]) 11/14/2016   non-occlusive, asymptomatic, seen by vascular surgery Dr. Oneida Alar who rec repeated ABIs annually  . Prostate cancer [facility])     Past Surgical History:  Procedure Laterality Date  . COLON SURGERY     "locked bowel" fixed  . HERNIA REPAIR    . LEFT HEART CATHETERIZATION WITH CORONARY ANGIOGRAM N/A 09/15/2013   Procedure: LEFT HEART CATHETERIZATION WITH CORONARY ANGIOGRAM;  Surgeon: Peter M Martinique, MD;  Location: [facility] CATH LAB;  Service: Cardiovascular;  Laterality: N/A;  . PROSTATE SURGERY  2005    There were no vitals filed for this visit.  Subjective Assessment - 11/12/18 0801    Subjective  The leakage is not as bad as it was. It is 25% better.    Patient Stated Goals  reduce urinary leakage    Currently in Pain?  No/denies    Multiple Pain Sites  No         [facility] PT Assessment - 11/12/18 0001      PROM   Right Hip External Rotation   50    Left Hip External Rotation   50                   [facility] Adult PT Treatment/Exercise - 11/12/18 0001      Self-Care   Self-Care  Other Self-Care Comments    Other Self-Care Comments    educated pateint on perineal massage to improve blood flow to the pelvic floor muscles and improve contraction; education on bladder irritants and how it affects the bladder      Neuro Re-ed    Neuro Re-ed Details   diaphragmatic breathing in supine; plevic floor contraction in sitting for 8 seconds      Lumbar Exercises: Supine   Bridge with Ball Squeeze  15 reps;5 seconds    Bridge with [employer] Limitations  with pelvic floor contraction      Lumbar Exercises: Prone   Straight Leg Raise  10 reps;1 second   each leg after mobilization     Manual Therapy   Manual Therapy  Joint mobilization    Joint Mobilization  distraction, lateral glide and inferior glide of bil. hips with mulligan belt, anterior glide of bil. hips in prone              PT Education - 11/12/18 0841    Education Details  Access Code: [email or web address]; bladder irritants    Person(s) Educated  Patient    Methods  Explanation;Demonstration;Handout    Comprehension  Returned demonstration;Verbalized understanding  PT Short Term Goals - 11/12/18 0845      PT SHORT TERM GOAL #1   Title  independent with initial HEP    Time  4    Period  Weeks    Status  Achieved    Target Date  11/17/18      PT SHORT TERM GOAL #2   Title  able to perform diaphragmatic breathing due to improved mobility of the abdominal and pelvic floor tissue    Time  4    Period  Weeks    Status  Achieved      PT SHORT TERM GOAL #3   Title  able to hold a pelvic floor contraction for 10 seconds in sidely and 5 quick contractions to strengthen pelvic floor    Time  4    Period  Weeks    Status  On-going    Target Date  11/17/18      PT SHORT TERM GOAL #4   Title  understand what bladder irritants are and how they affect the bladder    Time  4    Period  Weeks    Status  Achieved        PT Long Term Goals - 10/20/18 1428      PT LONG TERM GOAL #1   Title  independent with HEP and understand how to advance    Time  8     Period  Weeks    Status  New    Target Date  12/15/18      PT LONG TERM GOAL #2   Title  urinary leakage decreased >/= 70% due to improve pelvic floor strength and holding a contraction for 20 seconds    Time  8    Period  Weeks    Status  New    Target Date  12/15/18      PT LONG TERM GOAL #3   Title  not have to wear a depends pad due to reduction of urinary leakage    Time  8    Period  Weeks    Status  New    Target Date  12/15/18      PT LONG TERM GOAL #4   Title  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 Patient has tight hips and understands the importance of stretching her hips especially to be ready for radiation. Patient reports his urinary leakage is 25% better. Patient has increased bilateral hip external rotation.  Patient will benefit from skilled therapy to improve pelvic floor strength and endurance to reduce urinary leakage.    Personal Factors and Comorbidities  Age;Comorbidity 2;Comorbidity 3+;Time since onset of injury/illness/exacerbation;Sex    Comorbidities  s/p prostate cancer 2005; Diabetes, Hypertension, s/p hernia repair    Examination-Activity Limitations  Toileting;Continence    Examination-Participation Restrictions  Interpersonal Relationship    Stability/Clinical Decision Making  Evolving/Moderate complexity    Rehab Potential  Excellent    PT Frequency  1x / week    PT Duration  8 weeks    PT Treatment/Interventions  Biofeedback;Therapeutic activities;Therapeutic exercise;Neuromuscular re-education;Patient/family education;Dry needling;Scar mobilization;Manual techniques;Spinal Manipulations    PT Next Visit Plan  abdominal soft tissue work, pelvic  floor contraction; , pelvic floor EMG    PT Home Exercise Plan  Access Code: [email or web address]    Consulted and Agree with Plan of Care  Patient       Patient will benefit from skilled therapeutic intervention in order to improve the following deficits and impairments:  Decreased coordination, Increased fascial restricitons, Decreased activity tolerance, Decreased scar mobility, Decreased strength, Postural dysfunction  Visit Diagnosis: Muscle weakness (generalized)  Unspecified lack of coordination  Prostate cancer [facility])     Problem List Patient Active Problem List   Diagnosis Date Noted  . Malignant neoplasm of prostate ([place]) 10/27/2018  . Allergic reaction 08/10/2018  . Angioedema 08/10/2018  . Peripheral arterial occlusive disease ([place]) 10/28/2016  . Hyperlipidemia LDL goal <70 05/01/2015  . Tobacco use disorder 12/09/2013  . 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 Erectile dysfunction due to diseases classified elsewhere 12/09/2013  . Type 2 diabetes mellitus with other circulatory complications ([place]) [id number]  . Left lumbar radiculopathy 09/30/2012  . Chronic rhinitis 04/08/2011  . Hypertension   . HTN (hypertension), benign 03/09/2011  . History of prostate cancer 03/09/2011    Earlie Counts, PT 11/12/18 8:46 AM   [place] [facility] [address] [place], [place], [address] Phone: [phone]   Fax:  [phone]  Name: Alexander [name] MRN: [id number] Date of Birth: Sep 04, 1945

## 2018-11-19 ENCOUNTER — Ambulatory Visit: Payer: Medicare Other | Admitting: Physical Therapy

## 2018-11-19 ENCOUNTER — Encounter: Payer: Self-pay | Admitting: Physical Therapy

## 2018-11-19 ENCOUNTER — Other Ambulatory Visit: Payer: Self-pay

## 2018-11-19 DIAGNOSIS — R279 Unspecified lack of coordination: Secondary | ICD-10-CM | POA: Diagnosis not present

## 2018-11-19 DIAGNOSIS — C61 Malignant neoplasm of prostate: Secondary | ICD-10-CM

## 2018-11-19 DIAGNOSIS — M6281 Muscle weakness (generalized): Secondary | ICD-10-CM

## 2018-11-19 NOTE — Therapy (Signed)
[facility] [address] [place], [place], [address] Phone: [phone]   Fax:  [phone]  Physical Therapy Treatment  Patient Details  Name: CODEN Pomona MRN: [id number] Date of Birth: 1945-04-08 Referring Provider (PT): Dr. Thea Silversmith   Encounter Date: 11/19/2018  PT End of Session - 11/19/18 0804    Visit Number  4    Date for PT Re-Evaluation  12/15/18    Authorization Type  [employer]HC medicare    PT Start Time  0800    PT Stop Time  0840    PT Time Calculation (min)  40 min    Activity Tolerance  Patient tolerated treatment well    Behavior During Therapy  [facility] for tasks assessed/performed       Past Medical History:  Diagnosis Date  . Angio-edema   . Diabetes mellitus without complication ([place])   . Hypertension   . Neuromuscular disorder ([place])   . Peripheral arterial disease ([place]) 11/14/2016   non-occlusive, asymptomatic, seen by vascular surgery Dr. Oneida Alar who rec repeated ABIs annually  . Prostate cancer [facility])     Past Surgical History:  Procedure Laterality Date  . COLON SURGERY     "locked bowel" fixed  . HERNIA REPAIR    . LEFT HEART CATHETERIZATION WITH CORONARY ANGIOGRAM N/A 09/15/2013   Procedure: LEFT HEART CATHETERIZATION WITH CORONARY ANGIOGRAM;  Surgeon: Peter M Martinique, MD;  Location: [facility] CATH LAB;  Service: Cardiovascular;  Laterality: N/A;  . PROSTATE SURGERY  2005    There were no vitals filed for this visit.  Subjective Assessment - 11/19/18 0805    Subjective  I felt good after last week. I am 90% better. The leakage will happen and I do not know it. Patient wears the depends and changes it 1 tme per day. Pads are minimal wetness.    Patient Stated Goals  reduce urinary leakage    Currently in Pain?  No/denies         [facility] PT Assessment - 11/19/18 0001      PROM   Right Hip External Rotation   55    Left Hip External Rotation   60                   [facility]  Adult PT Treatment/Exercise - 11/19/18 0001      Neuro Re-ed    Neuro Re-ed Details   sitting pelvic floor contraction holding for 10 sec [id number] then 5 quick  flicks      Lumbar Exercises: Stretches   Passive Hamstring Stretch  Right;Left;1 rep;30 seconds   supine with strap   Passive Hamstring Stretch Limitations  then bring over body for lateral hip then outside for hip adductor stretch      Lumbar Exercises: Supine   Bridge  15 reps;1 second    Bridge Limitations  VC to contract the pelvic floor and lift hips high    Bridge with [employer]  15 reps;5 seconds    Bridge with [employer] Limitations  with pelvic floor contraction      Lumbar Exercises: Prone   Straight Leg Raise  10 reps;1 second   each leg after mobilization     Manual Therapy   Manual Therapy  Joint mobilization    Manual therapy comments  P/ROM to bil. hips after mobilization    Joint Mobilization  distraction, lateral glide and inferior glide of bil. hips with mulligan belt, anterior glide of bil. hips in  prone              PT Education - 11/19/18 0837    Education Details  Access Code: [email or web address]    Person(s) Educated  Patient    Methods  Explanation;Demonstration;Verbal cues;Handout    Comprehension  Verbalized understanding;Returned demonstration       PT Short Term Goals - 11/19/18 0844      PT SHORT TERM GOAL #3   Title  able to hold a pelvic floor contraction for 10 seconds in sidely and 5 quick contractions to strengthen pelvic floor    Time  4    Period  Weeks    Status  Achieved    Target Date  11/17/18        PT Long Term Goals - 10/20/18 1428      PT LONG TERM GOAL #1   Title  independent with HEP and understand how to advance    Time  8    Period  Weeks    Status  New    Target Date  12/15/18      PT LONG TERM GOAL #2   Title  urinary leakage decreased >/= 70% due to improve pelvic floor strength and holding a contraction for 20 seconds    Time  8    Period  Weeks     Status  New    Target Date  12/15/18      PT LONG TERM GOAL #3   Title  not have to wear a depends pad due to reduction of urinary leakage    Time  8    Period  Weeks    Status  New    Target Date  12/15/18      PT LONG TERM GOAL #4   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 Patient is standing up straighter with less hip flexion. Patient has increased bilateral hip external rotation P/ROM. Patient reports his urinary leakage is 90% better. He changes his pads 1 time per day and they are minimally damp. Patient will benefit from skilled therapy to improve pelvic floor strength and endurance to reduce urinary leakage.    Personal Factors and Comorbidities  Age;Comorbidity 2;Comorbidity 3+;Time since onset of injury/illness/exacerbation;Sex    Comorbidities  s/p prostate cancer 2005; Diabetes, Hypertension, s/p hernia repair    Examination-Activity Limitations  Toileting;Continence    Examination-Participation Restrictions  Interpersonal Relationship    Stability/Clinical Decision Making  Evolving/Moderate complexity    Rehab Potential  Excellent    PT Frequency  1x / week    PT Duration  8 weeks    PT Treatment/Interventions  Biofeedback;Therapeutic activities;Therapeutic exercise;Neuromuscular re-education;Patient/family education;Dry needling;Scar mobilization;Manual techniques;Spinal Manipulations    PT Next Visit Plan  abdominal soft tissue work, pelvic floor contraction for 15 seconds in sitting; pelvic floor contraction in standing    PT Home Exercise Plan  Access Code: [email or web address]    Consulted and Agree with Plan of Care  Patient       Patient will benefit from skilled therapeutic intervention in  order to improve the  following deficits and impairments:  Decreased coordination, Increased fascial restricitons, Decreased activity tolerance, Decreased scar mobility, Decreased strength, Postural dysfunction  Visit Diagnosis: Muscle weakness (generalized)  Unspecified lack of coordination  Prostate cancer [facility])     Problem List Patient Active Problem List   Diagnosis Date Noted  . Malignant neoplasm of prostate ([place]) 10/27/2018  . Allergic reaction 08/10/2018  . Angioedema 08/10/2018  . Peripheral arterial occlusive disease ([place]) 10/28/2016  . Hyperlipidemia LDL goal <70 05/01/2015  . Tobacco use disorder 12/09/2013  . 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 Erectile dysfunction due to diseases classified elsewhere 12/09/2013  . Type 2 diabetes mellitus with other circulatory complications ([place]) [id number]  . Left lumbar radiculopathy 09/30/2012  . Chronic rhinitis 04/08/2011  . Hypertension   . HTN (hypertension), benign 03/09/2011  . History of prostate cancer 03/09/2011    Earlie Counts, PT 11/19/18 8:45 AM   [facility] [address], [place], [place], [address] Phone: [phone]   Fax:  [phone]  Name: MIGUELITO SCHNAIBLE MRN: [id number] Date of Birth: Jun 18, 1945

## 2018-11-19 NOTE — Patient Instructions (Signed)
Access Code: [email or web address]  URL: [email or web address]  Date: 11/19/2018  Prepared by: Earlie Counts   Exercises Supine Figure 4 Piriformis Stretch - 2 reps - 1 sets - 30 sec hold - 1x daily - 7x weekly Supine Hamstring Stretch - 2 reps - 1 sets - 30 sec hold - 1x daily - 7x weekly Supine Butterfly Groin Stretch - 1 reps - 1 sets - 1 min hold - 1x daily - 7x weekly Supine Lower Trunk Rotation - 2 reps - 1 sets - 30 sec hold - 1x daily - 7x weekly Supine Double Knee to Chest - 2 reps - 1 sets - 30 sec hold - 1x daily - 7x weekly Prone on Elbows Stretch - 2 reps - 1 sets - 1x daily - 7x weekly Reclined Diaphragmatic Breathing - 10 reps - 1 sets - 2-3x daily - 7x weekly Seated Pelvic Floor Contraction - 10 reps - 1 sets - 10 sec hold - 4x daily - 7x weekly Prone Hip Extension with Pillow Under Abdomen - 10 reps - 1 sets - 1x daily - 7x weekly Supine Bridge - 15 reps - 1 sets - 1x daily - 7x weekly [facility] [address] [place], [place] [address] Phone # [phone] Fax [phone]

## 2018-11-26 ENCOUNTER — Other Ambulatory Visit: Payer: Self-pay

## 2018-11-26 ENCOUNTER — Ambulatory Visit: Payer: Medicare Other | Attending: Radiation Oncology | Admitting: Physical Therapy

## 2018-11-26 ENCOUNTER — Encounter: Payer: Self-pay | Admitting: Physical Therapy

## 2018-11-26 DIAGNOSIS — C61 Malignant neoplasm of prostate: Secondary | ICD-10-CM | POA: Diagnosis present

## 2018-11-26 DIAGNOSIS — M6281 Muscle weakness (generalized): Secondary | ICD-10-CM | POA: Insufficient documentation

## 2018-11-26 DIAGNOSIS — R279 Unspecified lack of coordination: Secondary | ICD-10-CM

## 2018-11-26 NOTE — Therapy (Signed)
[facility] [address], [place], [place], [address] Phone: [phone]   Fax:  [phone]  Physical Therapy Treatment  Patient Details  Name: Alexander [name] MRN: [id number] Date of Birth: [birth year] Referring Provider (PT): Dr. Thea Silversmith   Encounter Date: 11/26/2018  PT End of Session - 11/26/18 0759    Visit Number  5    Date for PT Re-Evaluation  12/15/18    Authorization Type  UHC medicare    PT Start Time  0800    PT Stop Time  0838    PT Time Calculation (min)  38 min    Activity Tolerance  Patient tolerated treatment well    Behavior During Therapy  [facility] for tasks assessed/performed       Past Medical History:  Diagnosis Date  . Angio-edema   . Diabetes mellitus without complication ([place])   . Hypertension   . Neuromuscular disorder ([place])   . Peripheral arterial disease ([place]) 11/14/2016   non-occlusive, asymptomatic, seen by vascular surgery Dr. Oneida Alar who rec repeated ABIs annually  . Prostate cancer [facility])     Past Surgical History:  Procedure Laterality Date  . COLON SURGERY     "locked bowel" fixed  . HERNIA REPAIR    . LEFT HEART CATHETERIZATION WITH CORONARY ANGIOGRAM N/A 09/15/2013   Procedure: LEFT HEART CATHETERIZATION WITH CORONARY ANGIOGRAM;  Surgeon: Peter M Martinique, MD;  Location: [facility] CATH LAB;  Service: Cardiovascular;  Laterality: N/A;  . PROSTATE SURGERY  2005    There were no vitals filed for this visit.  Subjective Assessment - 11/26/18 0756    Subjective  I felt sore after last visit from all of the stretching. Sometimes I leak and sometimes I am better. I do not leak when I lay down. I wear 1 depends per day. Sometimes the pads are wet and sometimes not. I am overall 90% better.    Patient Stated Goals  reduce urinary leakage    Currently in Pain?  No/denies                    Pelvic Floor Special Questions - 11/26/18 0001    Biofeedback  sitting resting  tone 1.5 uv;  contract pelvic floor without holding breath and with increased lumbar lordosis, ball squeeze to engage further muscles, Patient is able to contract above 6 uv for 2 seconds, sit to stand able to contract to 10 uv, sit sto stand; standing contract pelvic floor for 5 seconds    Biofeedback sensor type  Surface   rectum       [facility] Adult PT Treatment/Exercise - 11/26/18 0001      Manual Therapy   Manual Therapy  Passive ROM    Passive ROM  manually stretched bilateral hips in all directions for rotators, hip flexors, hamstring, and piriformis             PT Education - 11/26/18 0839    Education Details  pelvic floor contraction in sitting and standing, sit to stand    Person(s) Educated  Patient    Methods  Explanation;Demonstration;Verbal cues;Handout    Comprehension  Returned demonstration;Verbalized understanding       PT Short Term Goals - 11/19/18 0844      PT SHORT TERM GOAL #3   Title  able to hold a pelvic floor contraction for 10 seconds in sidely and 5 quick contractions to strengthen pelvic floor    Time  4  Period  Weeks    Status  Achieved    Target Date  11/17/18        PT Long Term Goals - 11/26/18 0844      PT LONG TERM GOAL #1   Title  independent with HEP and understand how to advance    Time  8    Period  Weeks    Status  On-going      PT LONG TERM GOAL #2   Title  urinary leakage decreased >/= 70% due to improve pelvic floor strength and holding a contraction for 20 seconds    Time  8    Period  Weeks    Status  On-going      PT LONG TERM GOAL #3   Title  not have to wear a depends pad due to reduction of urinary leakage    Baseline  wears 1    Period  Weeks    Status  On-going      PT LONG TERM GOAL #4   Title  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 Patient continues to wear a depends. Patient resting tone is 1.5 uv. Patient needed tactile cues and visual cues to contract the pelvic floor in sittng. Patient is able to contract when going from sit to stand and back. Patient understands to tuck his penis in for pelvic floor contraction. Patient needs verbal cues to breath with pelvic floor contraction. Patient has improved flexibility of bilateral hips. Patient will benefit from skilled therapy to improve pelvic floor strength and endurance to reduce urinary leakage.    Personal Factors and Comorbidities  Age;Comorbidity 2;Comorbidity 3+;Time since onset of injury/illness/exacerbation;Sex    Comorbidities  s/p prostate cancer 2005; Diabetes, Hypertension, s/p hernia repair    Examination-Activity Limitations  Toileting;Continence    Examination-Participation Restrictions  Interpersonal Relationship    Stability/Clinical Decision Making  Evolving/Moderate complexity    Rehab Potential  Excellent    PT Frequency  1x / week    PT Duration  8 weeks    PT Treatment/Interventions  Biofeedback;Therapeutic activities;Therapeutic exercise;Neuromuscular re-education;Patient/family education;Dry needling;Scar mobilization;Manual techniques;Spinal Manipulations    PT Next Visit Plan  pelvic floor EMG with contraction in sitting review HEP ; see when having radiation;go over lifting bricks with pelvic floor contraction    PT Home Exercise Plan  Access Code: [email or web address]    Recommended Other Services  MD signed intiial eval    Consulted and Agree with Plan of Care  Patient       Patient will benefit from skilled therapeutic intervention in order to improve the following deficits and impairments:  Decreased coordination, Increased fascial restricitons, Decreased activity tolerance, Decreased scar mobility, Decreased strength, Postural dysfunction  Visit Diagnosis: Muscle  weakness (generalized)  Unspecified lack of coordination  Prostate cancer [facility])     Problem List Patient Active Problem List   Diagnosis Date Noted  . Malignant neoplasm of prostate ([place]) 10/27/2018  . Allergic reaction 08/10/2018  . Angioedema 08/10/2018  . Peripheral arterial occlusive disease ([place]) 10/28/2016  . Hyperlipidemia LDL goal <70 05/01/2015  . Tobacco use disorder 12/09/2013  . Coronary  artery disease involving native coronary artery of native heart without angina pectoris 12/09/2013  . Erectile dysfunction due to diseases classified elsewhere 12/09/2013  . Type 2 diabetes mellitus with other circulatory complications ([place]) [id number]  . Left lumbar radiculopathy 09/30/2012  . Chronic rhinitis 04/08/2011  . Hypertension   . HTN (hypertension), benign 03/09/2011  . History of prostate cancer 03/09/2011    Earlie Counts, PT 11/26/18 8:46 AM   [facility] [address] [place], [place], [address] Phone: [phone]   Fax:  [phone]  Name: Alexander [name] MRN: [id number] Date of Birth: 06/12/1945

## 2018-11-26 NOTE — Patient Instructions (Addendum)
Adduction: Hip - Knees Together With Pelvic Floor (Sitting)    Sit with towel roll between knees. Squeeze pelvic floor while pushing knees together. Hold for _5__ seconds. Rest for __5_ seconds. Repeat _10__ times. Do _2__ times a day.  Copyright  VHI. All rights reserved.  Sit to Stand: Phase 3    Sitting, squeeze pelvic floor and hold. Lean trunk forward. Push up on arms and stand up. Relax. Repeat __5_ times. Do _2__ times a day.  Copyright  VHI. All rights reserved.  Slow Contraction: Gravity Resisted (Standing)    Standing, slowly squeeze pelvic floor for _5__ seconds. Rest for _5__ seconds. Repeat _10__ times. Do __2_ times a day.  Copyright  VHI. All rights reserved.  [place] [address] [place], [place] [address] Phone # [phone] Fax [phone]

## 2018-11-30 ENCOUNTER — Ambulatory Visit
Admission: RE | Admit: 2018-11-30 | Discharge: 2018-11-30 | Disposition: A | Discharge status: Home / Self Care | Payer: Medicare Other | Source: Ambulatory Visit | Attending: Radiation Oncology | Admitting: Radiation Oncology

## 2018-11-30 ENCOUNTER — Ambulatory Visit: Payer: Medicare Other | Admitting: Physical Therapy

## 2018-11-30 ENCOUNTER — Other Ambulatory Visit: Payer: Self-pay

## 2018-11-30 ENCOUNTER — Encounter: Payer: Self-pay | Admitting: Physical Therapy

## 2018-11-30 DIAGNOSIS — C61 Malignant neoplasm of prostate: Secondary | ICD-10-CM | POA: Diagnosis not present

## 2018-11-30 DIAGNOSIS — R279 Unspecified lack of coordination: Secondary | ICD-10-CM

## 2018-11-30 DIAGNOSIS — Z510 Encounter for antineoplastic radiation therapy: Secondary | ICD-10-CM | POA: Insufficient documentation

## 2018-11-30 DIAGNOSIS — M6281 Muscle weakness (generalized): Secondary | ICD-10-CM

## 2018-11-30 NOTE — Therapy (Signed)
[facility] [address] [place], [place], [address] Phone: [phone]   Fax:  [phone]  Physical Therapy Treatment  Patient Details  Name: Alexander [name] MRN: [id number] Date of Birth: 1945/07/24 Referring Provider (PT): Dr. Thea Silversmith   Encounter Date: 11/30/2018  PT End of Session - 11/30/18 1153    Visit Number  6    Date for PT Re-Evaluation  12/15/18    Authorization Type  [employer] medicare    PT Start Time  2992    PT Stop Time  1225    PT Time Calculation (min)  40 min    Activity Tolerance  Patient tolerated treatment well    Behavior During Therapy  [facility] for tasks assessed/performed       Past Medical History:  Diagnosis Date  . Angio-edema   . Diabetes mellitus without complication ([place])   . Hypertension   . Neuromuscular disorder ([place])   . Peripheral arterial disease ([place]) 11/14/2016   non-occlusive, asymptomatic, seen by vascular surgery Dr. Oneida Alar who rec repeated ABIs annually  . Prostate cancer [facility])     Past Surgical History:  Procedure Laterality Date  . COLON SURGERY     "locked bowel" fixed  . HERNIA REPAIR    . LEFT HEART CATHETERIZATION WITH CORONARY ANGIOGRAM N/A 09/15/2013   Procedure: LEFT HEART CATHETERIZATION WITH CORONARY ANGIOGRAM;  Surgeon: Peter M Martinique, MD;  Location: [facility] CATH LAB;  Service: Cardiovascular;  Laterality: N/A;  . PROSTATE SURGERY  2005    There were no vitals filed for this visit.  Subjective Assessment - 11/30/18 1149    Subjective  I went to [place] [facility] to be measured for the radiation. I start my radiation on 12/09/2018. I try to exercise the pelvic floor every chance I get. Lately I have less urinary leakage.    Patient Stated Goals  reduce urinary leakage    Currently in Pain?  No/denies         [facility] PT Assessment - 11/30/18 0001      Assessment   Medical Diagnosis  C61 Malignant neoplasm of prostate    Referring Provider (PT)  Dr. Thea Silversmith    Prior Therapy  none      Precautions   Precautions  Other (comment)    Precaution Comments  prostate cancer      Restrictions   Weight Bearing Restrictions  No      [place] residence      Prior Function   Level of Independence  Independent    Vocation  Part time employment    Vocation Requirements  brick mason    Leisure  wants to start walking      Cognition   Overall Cognitive Status  Within Functional Limits for tasks assessed      Posture/Postural Control   Posture/Postural Control  Postural limitations    Postural Limitations  Flexed trunk      ROM / Strength   AROM / PROM / Strength  AROM;PROM;Strength      AROM   Lumbar Extension  decreased by 50%    Lumbar - Right Side Bend  full    Lumbar - Left Side Bend  full      PROM   Right Hip External Rotation   55    Left Hip External Rotation   60      Strength   Right Hip Extension  4/5  Left Hip Extension  4/5                Pelvic Floor Special Questions - 11/30/18 0001    Urinary Leakage  Yes   90% better   Exam Type  Deferred        [facility] Adult PT Treatment/Exercise - 11/30/18 0001      Therapeutic Activites    Therapeutic Activities  Lifting    Lifting  lifting and squatting with pelvic floor contraction, 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 urinate every 3-4 hours instead of waiting 1 time per day for good bowel health      Neuro Re-ed    Neuro Re-ed Details   sitting pelvic floor contraction for 20 seconds 5 times  ending with 5 quick contractions; standing pelvic floor contraction for 10 seconds 5 times ending with quick flicks             PT Education - 11/30/18 1226    Education Details  information on skin care with radiation, pelvic floor contraction, lifting techniques with pelvic floor contracion    Person(s) Educated  Patient     Methods  Explanation;Demonstration;Verbal cues;Handout    Comprehension  Returned demonstration;Verbalized understanding       PT Short Term Goals - 11/30/18 1153      PT SHORT TERM GOAL #1   Title  independent with initial HEP    Time  4    Period  Weeks    Status  Achieved    Target Date  11/17/18      PT SHORT TERM GOAL #2   Title  able to perform diaphragmatic breathing due to improved mobility of the abdominal and pelvic floor tissue    Time  4    Period  Weeks    Status  Achieved    Target Date  11/17/18      PT SHORT TERM GOAL #3   Title  able to hold a pelvic floor contraction for 10 seconds in sidely and 5 quick contractions to strengthen pelvic floor    Time  4    Period  Weeks    Status  Achieved    Target Date  11/17/18      PT SHORT TERM GOAL #4   Title  understand what bladder irritants are and how they affect the bladder    Time  4    Period  Weeks    Status  Achieved    Target Date  11/17/18        PT Long Term Goals - 11/30/18 1154      PT LONG TERM GOAL #1   Title  independent with HEP and understand how to advance    Time  8    Period  Weeks    Status  Achieved      PT LONG TERM GOAL #2   Title  urinary leakage decreased >/= 70% due to improve pelvic floor strength and holding a contraction for 20 seconds    Time  8    Period  Weeks    Status  Achieved      PT LONG TERM GOAL #3   Title  not have to wear a depends pad due to reduction of urinary leakage    Baseline  wears 1    Time  8    Period  Weeks    Status  Achieved  PT LONG TERM GOAL #4   Title  understand how 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 Patient reports his urinary leakage is 90% better. Patient understands how to lift the pelvic floor with  his work tasks to reduce leakage. Patient understands ways to take care of his skin with radiation. Patient has increased hip strength, pelvic floor strength and flexibility. Patient will be discharged and start his radiation on 12/09/2018.    Personal Factors and Comorbidities  Age;Comorbidity 2;Comorbidity 3+;Time since onset of injury/illness/exacerbation;Sex    Comorbidities  s/p prostate cancer 2005; Diabetes, Hypertension, s/p hernia repair    Examination-Activity Limitations  Toileting;Continence    Examination-Participation Restrictions  Interpersonal Relationship    Stability/Clinical Decision Making  Evolving/Moderate complexity    Rehab Potential  Excellent    PT Frequency  --    PT Duration  --    PT Treatment/Interventions  Biofeedback;Therapeutic activities;Therapeutic exercise;Neuromuscular re-education;Patient/family education;Dry needling;Scar mobilization;Manual techniques;Spinal Manipulations    PT Next Visit Plan  [place] to currect HEP    PT Home Exercise Plan  Access Code: [email or web address]    Consulted and Agree with Plan of Care  Patient       Patient will benefit from skilled therapeutic intervention in order to improve the following deficits and impairments:  Decreased coordination, Increased fascial restricitons, Decreased activity tolerance, Decreased scar mobility, Decreased strength, Postural dysfunction  Visit Diagnosis: Muscle weakness (generalized)  Unspecified lack of coordination  Prostate cancer [facility])     Problem List Patient Active Problem List   Diagnosis Date Noted  . Malignant neoplasm of prostate ([place]) 10/27/2018  . Allergic reaction 08/10/2018  . Angioedema 08/10/2018  . Peripheral arterial occlusive disease ([place]) 10/28/2016  . Hyperlipidemia LDL goal <70 05/01/2015  . Tobacco use disorder 12/09/2013  . 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 Erectile dysfunction due to  diseases classified elsewhere 12/09/2013  . Type 2 diabetes mellitus with other circulatory complications ([place]) 25/36/6440  . Left lumbar radiculopathy 09/30/2012  . Chronic rhinitis 04/08/2011  . Hypertension   . HTN (hypertension), benign 03/09/2011  . History of prostate cancer 03/09/2011    Alexander [name], PT 11/30/18 12:32 PM   [facility] [address], [place], [place], [address] Phone: [phone]   Fax:  [phone]  Name: Alexander [name] MRN: [id number] Date of Birth: [birth year]  PHYSICAL THERAPY DISCHARGE SUMMARY  Visits from Start of Care: 6  Current functional level related to goals / functional outcomes: See above.   Remaining deficits: See above.    Education / Equipment: HEP Plan: Patient agrees to discharge.  Patient goals were met. Patient is being discharged due to meeting the stated rehab goals.  Thank you for the referral. Alexander [name], PT 11/30/18 12:32 PM  ?????

## 2018-11-30 NOTE — Patient Instructions (Addendum)
Slow Contraction: Gravity Resisted (Standing)    Standing, slowly squeeze pelvic floor for _10__ seconds. Rest for _5__ seconds. Repeat _10__ times. Do __2_ times a day.  End with 5 quick flicks    Slow Contraction: Gravity Resisted (Sitting)    Sitting, slowly squeeze pelvic floor for _20__ seconds. Rest for _10__ seconds. Repeat _5__ times. Do _2__ times a day. End with 5 quick flicks Copyright  VHI. All rights reserved.  Squat    Standing, squeeze pelvic floor and hold. Squat. Relax. Repeat __5_ times. Do _2__ times a day.  Copyright  VHI. All rights reserved.  Go to the bathroom every 3-4 hours  Skin Care and Bowel Hygiene  Anyone who has frequent bowel movements, diarrhea, or bowel leakage (fecal incontinence) may experience soreness or skin irritation around the anal region.  Occasionally, the skin can become so inflamed that it breaks into open sores.  Prevent skin breakdown by following good skin care habits.  Cleaning and Washing Techniques After having a bowel movement, men and women should tighten their anal sphincter before wiping.  Women should always wipe from front to back to prevent fecal matter from getting into the urethra and vagina.   Tips for Cleaning and Washing . wipe from front to back towards the anus . always wipe gently with soft toilet paper, or ideally with moist toilet paper . wipe only once with each piece of toilet paper so as not to re-contaminate the area . wash in warm water alone or with a minimal amount of mild, fragrance-free soap . use non-biological washing powder . gently pat skin completely dry, avoiding rubbing . if drying the skin after washing is difficult or uncomfortable, try using a cool hairdryer on a low setting (use very carefully) . allow air to get to the irritated area for some part of every day . use protective skin creams containing zinc as recommended by your doctor  What To Avoid . baths with extra-hot water .  soaking for long periods of time in the bathtub . disinfectants and antiseptics  . bath oils, bath salts, and talcum powder . using plastic pants, pads, and sheets, which cause sweating . scratching at the irritated area  Additional Tips . some people find that citrus and acidic foods cause or worsen skin irritation . eat a healthy, balanced diet that is high in fiber  . drink plenty of fluids . wear cotton underwear to allow the skin to breath . talk to your healthcare provider about further treatment options; persistent problems need medical attention  Alexander [name], PT [address] [place], [place] [address]     [phone]

## 2018-12-01 NOTE — Progress Notes (Signed)
  Radiation Oncology         [phone] ________________________________  Name: Alexander [name] MRN: [id number]  Date: 11/30/2018  DOB: Jun 08, 1945  SIMULATION AND TREATMENT PLANNING NOTE    ICD-10-CM   1. Malignant neoplasm of prostate ([place])  C61     DIAGNOSIS:  73 y.o. gentleman with biochemically recurrent prostate cancer with a current PSA of 1.06 s/p RALP in 2006 for pT2cNx, Gleason 3+3 disease   NARRATIVE:  The patient was brought to the [place].  Identity was confirmed.  All relevant records and images related to the planned course of therapy were reviewed.  The patient freely provided informed written consent to proceed with treatment after reviewing the details related to the planned course of therapy. The consent form was witnessed and verified by the simulation staff.  Then, the patient was set-up in a stable reproducible supine position for radiation therapy.  A vacuum lock pillow device was custom fabricated to position his legs in a reproducible immobilized position.  Then, I performed a urethrogram under sterile conditions to identify the prostatic bed.  CT images were obtained.  Surface markings were placed.  The CT images were loaded into the planning software.  Then the prostate bed target, pelvic lymph node target and avoidance structures including the rectum, bladder, bowel and hips were contoured.  Treatment planning then occurred.  The radiation prescription was entered and confirmed.  A total of one complex treatment devices were fabricated. I have requested : Intensity Modulated Radiotherapy (IMRT) is medically necessary for this case for the following reason:  Rectal sparing.Marland Kitchen  PLAN:  The patient will receive 45 Gy in 25 fractions of 1.8 Gy, followed by a boost to the prostate bed to a total dose of 68.4 Gy with 13 additional fractions of 1.8 Gy.   ________________________________  Sheral Apley Tammi Klippel, M.D.

## 2018-12-01 NOTE — Addendum Note (Signed)
Encounter addended by: Tyler Pita, MD on: 12/01/2018 5:35 PM  Actions taken: Clinical Note Signed

## 2018-12-02 ENCOUNTER — Telehealth: Payer: Self-pay | Admitting: Family Medicine

## 2018-12-02 NOTE — Telephone Encounter (Signed)
Pt is having bladder problems and is wanting to find out if he can get adult diapers through his insurance company.

## 2018-12-07 DIAGNOSIS — Z510 Encounter for antineoplastic radiation therapy: Secondary | ICD-10-CM | POA: Diagnosis not present

## 2018-12-09 ENCOUNTER — Other Ambulatory Visit: Payer: Self-pay

## 2018-12-09 ENCOUNTER — Ambulatory Visit
Admission: RE | Admit: 2018-12-09 | Discharge: 2018-12-09 | Disposition: A | Discharge status: Home / Self Care | Payer: Medicare Other | Source: Ambulatory Visit | Attending: Radiation Oncology | Admitting: Radiation Oncology

## 2018-12-09 ENCOUNTER — Telehealth: Payer: Self-pay | Admitting: Family Medicine

## 2018-12-09 DIAGNOSIS — Z510 Encounter for antineoplastic radiation therapy: Secondary | ICD-10-CM | POA: Diagnosis not present

## 2018-12-09 NOTE — Telephone Encounter (Signed)
Pt request refill   hydrochlorothiazide (HYDRODIURIL) 25 MG tablet  Last written by Dr Brigitte Pulse.  Pt will need new Rx from Dr Pamella Pert  [facility] [place], [place] [phone] (Phone) [phone] (Fax)

## 2018-12-10 ENCOUNTER — Other Ambulatory Visit: Payer: Self-pay

## 2018-12-10 ENCOUNTER — Ambulatory Visit
Admission: RE | Admit: 2018-12-10 | Discharge: 2018-12-10 | Disposition: A | Discharge status: Home / Self Care | Payer: Medicare Other | Source: Ambulatory Visit | Attending: Radiation Oncology | Admitting: Radiation Oncology

## 2018-12-10 DIAGNOSIS — Z510 Encounter for antineoplastic radiation therapy: Secondary | ICD-10-CM | POA: Diagnosis not present

## 2018-12-11 ENCOUNTER — Other Ambulatory Visit: Payer: Self-pay

## 2018-12-11 ENCOUNTER — Ambulatory Visit
Admission: RE | Admit: 2018-12-11 | Discharge: 2018-12-11 | Disposition: A | Discharge status: Home / Self Care | Payer: Medicare Other | Source: Ambulatory Visit | Attending: Radiation Oncology | Admitting: Radiation Oncology

## 2018-12-11 DIAGNOSIS — Z510 Encounter for antineoplastic radiation therapy: Secondary | ICD-10-CM | POA: Diagnosis not present

## 2018-12-13 ENCOUNTER — Ambulatory Visit
Admission: RE | Admit: 2018-12-13 | Discharge: 2018-12-13 | Disposition: A | Discharge status: Home / Self Care | Payer: Medicare Other | Source: Ambulatory Visit | Attending: Radiation Oncology | Admitting: Radiation Oncology

## 2018-12-13 ENCOUNTER — Other Ambulatory Visit: Payer: Self-pay

## 2018-12-13 DIAGNOSIS — Z510 Encounter for antineoplastic radiation therapy: Secondary | ICD-10-CM | POA: Diagnosis not present

## 2018-12-14 ENCOUNTER — Other Ambulatory Visit: Payer: Self-pay

## 2018-12-14 ENCOUNTER — Ambulatory Visit
Admission: RE | Admit: 2018-12-14 | Discharge: 2018-12-14 | Disposition: A | Discharge status: Home / Self Care | Payer: Medicare Other | Source: Ambulatory Visit | Attending: Radiation Oncology | Admitting: Radiation Oncology

## 2018-12-14 DIAGNOSIS — Z510 Encounter for antineoplastic radiation therapy: Secondary | ICD-10-CM | POA: Diagnosis not present

## 2018-12-15 ENCOUNTER — Ambulatory Visit
Admission: RE | Admit: 2018-12-15 | Discharge: 2018-12-15 | Disposition: A | Discharge status: Home / Self Care | Payer: Medicare Other | Source: Ambulatory Visit | Attending: Radiation Oncology | Admitting: Radiation Oncology

## 2018-12-15 ENCOUNTER — Other Ambulatory Visit: Payer: Self-pay

## 2018-12-15 DIAGNOSIS — Z510 Encounter for antineoplastic radiation therapy: Secondary | ICD-10-CM | POA: Diagnosis not present

## 2018-12-16 ENCOUNTER — Other Ambulatory Visit: Payer: Self-pay

## 2018-12-16 ENCOUNTER — Ambulatory Visit
Admission: RE | Admit: 2018-12-16 | Discharge: 2018-12-16 | Disposition: A | Discharge status: Home / Self Care | Payer: Medicare Other | Source: Ambulatory Visit | Attending: Radiation Oncology | Admitting: Radiation Oncology

## 2018-12-16 DIAGNOSIS — Z510 Encounter for antineoplastic radiation therapy: Secondary | ICD-10-CM | POA: Diagnosis not present

## 2018-12-21 ENCOUNTER — Other Ambulatory Visit: Payer: Self-pay

## 2018-12-21 ENCOUNTER — Ambulatory Visit
Admission: RE | Admit: 2018-12-21 | Discharge: 2018-12-21 | Disposition: A | Discharge status: Home / Self Care | Payer: Medicare Other | Source: Ambulatory Visit | Attending: Radiation Oncology | Admitting: Radiation Oncology

## 2018-12-21 DIAGNOSIS — Z510 Encounter for antineoplastic radiation therapy: Secondary | ICD-10-CM | POA: Diagnosis not present

## 2018-12-22 ENCOUNTER — Ambulatory Visit
Admission: RE | Admit: 2018-12-22 | Discharge: 2018-12-22 | Disposition: A | Discharge status: Home / Self Care | Payer: Medicare Other | Source: Ambulatory Visit | Attending: Radiation Oncology | Admitting: Radiation Oncology

## 2018-12-22 ENCOUNTER — Other Ambulatory Visit: Payer: Self-pay

## 2018-12-22 DIAGNOSIS — Z510 Encounter for antineoplastic radiation therapy: Secondary | ICD-10-CM | POA: Diagnosis not present

## 2018-12-22 DIAGNOSIS — C61 Malignant neoplasm of prostate: Secondary | ICD-10-CM | POA: Insufficient documentation

## 2018-12-23 ENCOUNTER — Ambulatory Visit
Admission: RE | Admit: 2018-12-23 | Discharge: 2018-12-23 | Disposition: A | Discharge status: Home / Self Care | Payer: Medicare Other | Source: Ambulatory Visit | Attending: Radiation Oncology | Admitting: Radiation Oncology

## 2018-12-23 ENCOUNTER — Other Ambulatory Visit: Payer: Self-pay

## 2018-12-23 DIAGNOSIS — Z510 Encounter for antineoplastic radiation therapy: Secondary | ICD-10-CM | POA: Diagnosis not present

## 2018-12-24 ENCOUNTER — Other Ambulatory Visit: Payer: Self-pay

## 2018-12-24 ENCOUNTER — Ambulatory Visit
Admission: RE | Admit: 2018-12-24 | Discharge: 2018-12-24 | Disposition: A | Discharge status: Home / Self Care | Payer: Medicare Other | Source: Ambulatory Visit | Attending: Radiation Oncology | Admitting: Radiation Oncology

## 2018-12-24 DIAGNOSIS — Z510 Encounter for antineoplastic radiation therapy: Secondary | ICD-10-CM | POA: Diagnosis not present

## 2018-12-25 ENCOUNTER — Ambulatory Visit
Admission: RE | Admit: 2018-12-25 | Discharge: 2018-12-25 | Disposition: A | Discharge status: Home / Self Care | Payer: Medicare Other | Source: Ambulatory Visit | Attending: Radiation Oncology | Admitting: Radiation Oncology

## 2018-12-25 ENCOUNTER — Other Ambulatory Visit: Payer: Self-pay

## 2018-12-25 DIAGNOSIS — Z510 Encounter for antineoplastic radiation therapy: Secondary | ICD-10-CM | POA: Diagnosis not present

## 2018-12-28 ENCOUNTER — Ambulatory Visit
Admission: RE | Admit: 2018-12-28 | Discharge: 2018-12-28 | Disposition: A | Discharge status: Home / Self Care | Payer: Medicare Other | Source: Ambulatory Visit | Attending: Radiation Oncology | Admitting: Radiation Oncology

## 2018-12-28 ENCOUNTER — Encounter: Payer: Self-pay | Admitting: Medical Oncology

## 2018-12-28 ENCOUNTER — Other Ambulatory Visit: Payer: Self-pay

## 2018-12-28 DIAGNOSIS — Z510 Encounter for antineoplastic radiation therapy: Secondary | ICD-10-CM | POA: Diagnosis not present

## 2018-12-29 ENCOUNTER — Other Ambulatory Visit: Payer: Self-pay

## 2018-12-29 ENCOUNTER — Ambulatory Visit
Admission: RE | Admit: 2018-12-29 | Discharge: 2018-12-29 | Disposition: A | Discharge status: Home / Self Care | Payer: Medicare Other | Source: Ambulatory Visit | Attending: Radiation Oncology | Admitting: Radiation Oncology

## 2018-12-29 DIAGNOSIS — Z510 Encounter for antineoplastic radiation therapy: Secondary | ICD-10-CM | POA: Diagnosis not present

## 2018-12-30 ENCOUNTER — Ambulatory Visit
Admission: RE | Admit: 2018-12-30 | Discharge: 2018-12-30 | Disposition: A | Discharge status: Home / Self Care | Payer: Medicare Other | Source: Ambulatory Visit | Attending: Radiation Oncology | Admitting: Radiation Oncology

## 2018-12-30 ENCOUNTER — Other Ambulatory Visit: Payer: Self-pay

## 2018-12-30 DIAGNOSIS — Z510 Encounter for antineoplastic radiation therapy: Secondary | ICD-10-CM | POA: Diagnosis not present

## 2018-12-31 ENCOUNTER — Other Ambulatory Visit: Payer: Self-pay

## 2018-12-31 ENCOUNTER — Ambulatory Visit
Admission: RE | Admit: 2018-12-31 | Discharge: 2018-12-31 | Disposition: A | Discharge status: Home / Self Care | Payer: Medicare Other | Source: Ambulatory Visit | Attending: Radiation Oncology | Admitting: Radiation Oncology

## 2018-12-31 DIAGNOSIS — Z510 Encounter for antineoplastic radiation therapy: Secondary | ICD-10-CM | POA: Diagnosis not present

## 2019-01-01 ENCOUNTER — Ambulatory Visit
Admission: RE | Admit: 2019-01-01 | Discharge: 2019-01-01 | Disposition: A | Discharge status: Home / Self Care | Payer: Medicare Other | Source: Ambulatory Visit | Attending: Radiation Oncology | Admitting: Radiation Oncology

## 2019-01-01 ENCOUNTER — Ambulatory Visit (INDEPENDENT_AMBULATORY_CARE_PROVIDER_SITE_OTHER): Payer: Medicare Other | Admitting: Family Medicine

## 2019-01-01 ENCOUNTER — Other Ambulatory Visit: Payer: Self-pay

## 2019-01-01 ENCOUNTER — Encounter: Payer: Self-pay | Admitting: Family Medicine

## 2019-01-01 VITALS — BP 120/80 | HR 98 | Temp 98.5°F | Ht 65.0 in | Wt 138.6 lb

## 2019-01-01 DIAGNOSIS — H6121 Impacted cerumen, right ear: Secondary | ICD-10-CM | POA: Diagnosis not present

## 2019-01-01 DIAGNOSIS — Z510 Encounter for antineoplastic radiation therapy: Secondary | ICD-10-CM | POA: Diagnosis not present

## 2019-01-01 NOTE — Progress Notes (Signed)
12/11/20203:40 PM  Alexander [name] 1945-02-04, 73 y.o., male [id number]  Chief Complaint  Patient presents with  . Hearing Problem    right ear impaction    HPI:   Patient is a 73 y.o. male  who presents today for decreased hearing from right ear  Thinks he is having problems with wax Denies any ringing, pain or drainage  Depression screen [facility] 2/9 01/01/2019 11/11/2018 11/02/2018  Decreased Interest 0 0 0  Down, Depressed, Hopeless 0 0 0  PHQ - 2 Score 0 0 0    Fall Risk  01/01/2019 11/11/2018 11/02/2018 07/31/2018 04/30/2018  Falls in the past year? 0 0 0 0 0  Number falls in past yr: 0 0 0 0 0  Injury with Fall? 0 0 0 0 0  Follow up - Falls evaluation completed;Education provided;Falls prevention discussed - - -     Allergies  Allergen Reactions  . Lisinopril Swelling    angioedema    Prior to Admission medications   Medication Sig Start Date End Date Taking? Authorizing Provider  amLODipine (NORVASC) 10 MG tablet TAKE 1 TABLET BY MOUTH  DAILY 11/26/17  Yes Shawnee Knapp, MD  aspirin EC 81 MG tablet Take 81 mg by mouth daily as needed.    Yes [provider]  atorvastatin (LIPITOR) 40 MG tablet Take 1 tablet (40 mg total) by mouth daily. 12/20/17  Yes Shawnee Knapp, MD  diphenhydrAMINE (BENADRYL) 25 MG tablet Take 25 mg by mouth every 6 (six) hours as needed.   Yes [provider]  EPINEPHrine (EPIPEN 2-PAK) 0.3 mg/0.3 mL IJ SOAJ injection Inject 0.3 mLs (0.3 mg total) into the muscle as needed for anaphylaxis. 08/10/18  Yes Bobbitt, Sedalia Muta, MD  fexofenadine (ALLEGRA) 180 MG tablet Take 1 tablet (180 mg total) by mouth daily. Patient taking differently: Take 180 mg by mouth daily as needed.  08/10/18  Yes Bobbitt, Sedalia Muta, MD  fluticasone [facility]) 50 MCG/ACT nasal spray Place 2 sprays into both nostrils daily. 08/10/18  Yes Bobbitt, Sedalia Muta, MD  gabapentin (NEURONTIN) 300 MG capsule TAKE 3 CAPSULES BY MOUTH  TWO TIMES DAILY 05/05/18  Yes  Shawnee Knapp, MD  glipiZIDE (GLUCOTROL) 5 MG tablet Take 1 tablet (5 mg total) by mouth 2 (two) times daily before a meal. 11/02/18  Yes Rutherford Guys, MD  hydrochlorothiazide (HYDRODIURIL) 25 MG tablet TAKE 1 TABLET BY MOUTH  DAILY 11/26/17  Yes Shawnee Knapp, MD  HYDROcodone-acetaminophen (NORCO) 10-325 MG tablet Take 1 tablet by mouth every 6 (six) hours as needed for moderate pain. Dx M54.5, M54.16, G89.29 10/15/18  Yes Rutherford Guys, MD  meloxicam (MOBIC) 7.5 MG tablet Take 1 tablet (7.5 mg total) by mouth daily as needed. for pain 08/14/18  Yes Rutherford Guys, MD  metFORMIN (GLUCOPHAGE) 1000 MG tablet Take 1 tablet (1,000 mg total) by mouth 2 (two) times daily with a meal. 07/07/17  Yes Shawnee Knapp, MD  nicotine polacrilex (NICOTINE MINI) 2 MG lozenge Take 1 lozenge (2 mg total) by mouth as needed for smoking cessation. 10/21/18  Yes Olalere, Adewale A, MD  sildenafil (VIAGRA) 100 MG tablet Take 100 mg by mouth.   Yes [provider]  tiZANidine (ZANAFLEX) 4 MG tablet TAKE 1 TABLET BY MOUTH  EVERY 8 HOURS AS NEEDED FOR MUSCLE SPASMS IN  BACK/LEGS/FEET. 02/05/18  Yes Rutherford Guys, MD  triamcinolone cream (KENALOG) 0.1 % APPLY TOPICALLY TWO TIMES A DAY 11/26/17  Yes  Shawnee Knapp, MD  varenicline (CHANTIX STARTING MONTH PAK) 0.5 MG X 11 & 1 MG X 42 tablet Take one 0.5 mg tablet by mouth once daily for 3 days, then increase to one 0.5 mg tablet twice daily for 4 days, then increase to one 1 mg tablet twice daily. 10/21/18  Yes Olalere, Adewale A, MD  vitamin C (ASCORBIC ACID) 500 MG tablet Take 500 mg daily by mouth.   Yes [provider]    Past Medical History:  Diagnosis Date  . Angio-edema   . Diabetes mellitus without complication ([place])   . Hypertension   . Neuromuscular disorder ([place])   . Peripheral arterial disease ([place]) 11/14/2016   non-occlusive, asymptomatic, seen by vascular surgery Dr. Oneida Alar who rec repeated ABIs annually  . Prostate cancer [facility])     Past  Surgical History:  Procedure Laterality Date  . COLON SURGERY     "locked bowel" fixed  . HERNIA REPAIR    . LEFT HEART CATHETERIZATION WITH CORONARY ANGIOGRAM N/A 09/15/2013   Procedure: LEFT HEART CATHETERIZATION WITH CORONARY ANGIOGRAM;  Surgeon: Peter M Martinique, MD;  Location: [facility] CATH LAB;  Service: Cardiovascular;  Laterality: N/A;  . PROSTATE SURGERY  2005    Social History   Tobacco Use  . Smoking status: Current Every Day Smoker    Packs/day: 1.00    Years: 55.00    Pack years: 55.00    Types: Cigarettes  . Smokeless tobacco: Never Used  Substance Use Topics  . Alcohol use: No    Family History  Problem Relation Age of Onset  . Diabetes Mother   . Depression Father   . Hyperlipidemia Sister   . Breast cancer Neg Hx   . Prostate cancer Neg Hx   . Colon cancer Neg Hx     ROS Per hpi  OBJECTIVE:  Today's Vitals   01/01/19 1512  BP: 120/80  Pulse: 98  Temp: 98.5 F (36.9 C)  SpO2: 95%  Weight: 138 lb 9.6 oz (62.9 kg)  Height: 5\' 5"  (1.651 m)   Body mass index is 23.06 kg/m.   Physical Exam Vitals and nursing note reviewed.  Constitutional:      Appearance: He is well-developed.  HENT:     Head: Normocephalic and atraumatic.     Right Ear: Tympanic membrane, ear canal and external ear normal. There is impacted cerumen (cleared successfully by CMA).     Left Ear: Tympanic membrane, ear canal and external ear normal.  Eyes:     Conjunctiva/sclera: Conjunctivae normal.     Pupils: Pupils are equal, round, and reactive to light.  Pulmonary:     Effort: Pulmonary effort is normal.  Musculoskeletal:     Cervical back: Neck supple.  Skin:    General: Skin is warm and dry.  Neurological:     Mental Status: He is alert and oriented to person, place, and time.     No results found for this or any previous visit (from the past 24 hour(s)).  No results found.   ASSESSMENT and PLAN  1. Right ear impacted cerumen Lavaged successfully by cma,  hearing restored  Return for as scheduled.    Rutherford Guys, MD Primary Care at [place], [place] [address] Ph.  [phone] Fax [phone]

## 2019-01-01 NOTE — Patient Instructions (Signed)
° ° ° °  If you have lab work done today you will be contacted with your lab results within the next 2 weeks.  If you have not heard from us then please contact us. The fastest way to get your results is to register for My Chart. ° ° °IF you received an x-ray today, you will receive an invoice from [facility]. Please contact [facility] at [phone] with questions or concerns regarding your invoice.  ° °IF you received labwork today, you will receive an invoice from [facility]. Please contact [facility] at [phone] with questions or concerns regarding your invoice.  ° °Our billing staff will not be able to assist you with questions regarding bills from these companies. ° °You will be contacted with the lab results as soon as they are available. The fastest way to get your results is to activate your My Chart account. Instructions are located on the last page of this paperwork. If you have not heard from us regarding the results in 2 weeks, please contact this office. °  ° ° ° °

## 2019-01-01 NOTE — Telephone Encounter (Signed)
Spoke with pts daughter Yoshi Leeb and I advised her to call the pts insurance company to see if they could get help with providing diapers for her father. Pk/CMA

## 2019-01-04 ENCOUNTER — Other Ambulatory Visit: Payer: Self-pay

## 2019-01-04 ENCOUNTER — Ambulatory Visit
Admission: RE | Admit: 2019-01-04 | Discharge: 2019-01-04 | Disposition: A | Discharge status: Home / Self Care | Payer: Medicare Other | Source: Ambulatory Visit | Attending: Radiation Oncology | Admitting: Radiation Oncology

## 2019-01-04 DIAGNOSIS — Z510 Encounter for antineoplastic radiation therapy: Secondary | ICD-10-CM | POA: Diagnosis not present

## 2019-01-05 ENCOUNTER — Other Ambulatory Visit: Payer: Self-pay

## 2019-01-05 ENCOUNTER — Ambulatory Visit
Admission: RE | Admit: 2019-01-05 | Discharge: 2019-01-05 | Disposition: A | Discharge status: Home / Self Care | Payer: Medicare Other | Source: Ambulatory Visit | Attending: Radiation Oncology | Admitting: Radiation Oncology

## 2019-01-05 DIAGNOSIS — Z510 Encounter for antineoplastic radiation therapy: Secondary | ICD-10-CM | POA: Diagnosis not present

## 2019-01-05 MED ORDER — HYDROCHLOROTHIAZIDE 25 MG PO TABS: 25.0000 mg | ORAL_TABLET | Freq: Every day | ORAL | 1 refills | Status: DC

## 2019-01-05 NOTE — Telephone Encounter (Signed)
Last office visit 11/02/2018  Last filled Brigitte Pulse 11/26/2017 6 months.  Ok to fill

## 2019-01-05 NOTE — Addendum Note (Signed)
Addended by: Rutherford Guys on: 01/05/2019 01:23 PM   Modules accepted: Orders

## 2019-01-06 ENCOUNTER — Other Ambulatory Visit: Payer: Self-pay

## 2019-01-06 ENCOUNTER — Ambulatory Visit
Admission: RE | Admit: 2019-01-06 | Discharge: 2019-01-06 | Disposition: A | Discharge status: Home / Self Care | Payer: Medicare Other | Source: Ambulatory Visit | Attending: Radiation Oncology | Admitting: Radiation Oncology

## 2019-01-06 DIAGNOSIS — Z510 Encounter for antineoplastic radiation therapy: Secondary | ICD-10-CM | POA: Diagnosis not present

## 2019-01-07 ENCOUNTER — Ambulatory Visit
Admission: RE | Admit: 2019-01-07 | Discharge: 2019-01-07 | Disposition: A | Discharge status: Home / Self Care | Payer: Medicare Other | Source: Ambulatory Visit | Attending: Radiation Oncology | Admitting: Radiation Oncology

## 2019-01-07 ENCOUNTER — Other Ambulatory Visit: Payer: Self-pay

## 2019-01-07 DIAGNOSIS — Z510 Encounter for antineoplastic radiation therapy: Secondary | ICD-10-CM | POA: Diagnosis not present

## 2019-01-08 ENCOUNTER — Other Ambulatory Visit: Payer: Self-pay

## 2019-01-08 ENCOUNTER — Ambulatory Visit
Admission: RE | Admit: 2019-01-08 | Discharge: 2019-01-08 | Disposition: A | Discharge status: Home / Self Care | Payer: Medicare Other | Source: Ambulatory Visit | Attending: Radiation Oncology | Admitting: Radiation Oncology

## 2019-01-08 DIAGNOSIS — Z510 Encounter for antineoplastic radiation therapy: Secondary | ICD-10-CM | POA: Diagnosis not present

## 2019-01-11 ENCOUNTER — Ambulatory Visit
Admission: RE | Admit: 2019-01-11 | Discharge: 2019-01-11 | Disposition: A | Discharge status: Home / Self Care | Payer: Medicare Other | Source: Ambulatory Visit | Attending: Radiation Oncology | Admitting: Radiation Oncology

## 2019-01-11 ENCOUNTER — Other Ambulatory Visit: Payer: Self-pay

## 2019-01-11 DIAGNOSIS — Z510 Encounter for antineoplastic radiation therapy: Secondary | ICD-10-CM | POA: Diagnosis not present

## 2019-01-12 ENCOUNTER — Other Ambulatory Visit: Payer: Self-pay

## 2019-01-12 ENCOUNTER — Ambulatory Visit
Admission: RE | Admit: 2019-01-12 | Discharge: 2019-01-12 | Disposition: A | Discharge status: Home / Self Care | Payer: Medicare Other | Source: Ambulatory Visit | Attending: Radiation Oncology | Admitting: Radiation Oncology

## 2019-01-12 DIAGNOSIS — Z510 Encounter for antineoplastic radiation therapy: Secondary | ICD-10-CM | POA: Diagnosis not present

## 2019-01-13 ENCOUNTER — Ambulatory Visit
Admission: RE | Admit: 2019-01-13 | Discharge: 2019-01-13 | Disposition: A | Discharge status: Home / Self Care | Payer: Medicare Other | Source: Ambulatory Visit | Attending: Radiation Oncology | Admitting: Radiation Oncology

## 2019-01-13 ENCOUNTER — Other Ambulatory Visit: Payer: Self-pay

## 2019-01-13 DIAGNOSIS — Z510 Encounter for antineoplastic radiation therapy: Secondary | ICD-10-CM | POA: Diagnosis not present

## 2019-01-14 ENCOUNTER — Other Ambulatory Visit: Payer: Self-pay

## 2019-01-14 ENCOUNTER — Ambulatory Visit
Admission: RE | Admit: 2019-01-14 | Discharge: 2019-01-14 | Disposition: A | Discharge status: Home / Self Care | Payer: Medicare Other | Source: Ambulatory Visit | Attending: Radiation Oncology | Admitting: Radiation Oncology

## 2019-01-14 DIAGNOSIS — Z510 Encounter for antineoplastic radiation therapy: Secondary | ICD-10-CM | POA: Diagnosis not present

## 2019-01-18 ENCOUNTER — Ambulatory Visit
Admission: RE | Admit: 2019-01-18 | Discharge: 2019-01-18 | Disposition: A | Discharge status: Home / Self Care | Payer: Medicare Other | Source: Ambulatory Visit | Attending: Radiation Oncology | Admitting: Radiation Oncology

## 2019-01-18 ENCOUNTER — Other Ambulatory Visit: Payer: Self-pay

## 2019-01-18 DIAGNOSIS — Z510 Encounter for antineoplastic radiation therapy: Secondary | ICD-10-CM | POA: Diagnosis not present

## 2019-01-19 ENCOUNTER — Ambulatory Visit
Admission: RE | Admit: 2019-01-19 | Discharge: 2019-01-19 | Disposition: A | Discharge status: Home / Self Care | Payer: Medicare Other | Source: Ambulatory Visit | Attending: Radiation Oncology | Admitting: Radiation Oncology

## 2019-01-19 ENCOUNTER — Other Ambulatory Visit: Payer: Self-pay

## 2019-01-19 DIAGNOSIS — Z510 Encounter for antineoplastic radiation therapy: Secondary | ICD-10-CM | POA: Diagnosis not present

## 2019-01-20 ENCOUNTER — Other Ambulatory Visit: Payer: Self-pay

## 2019-01-20 ENCOUNTER — Ambulatory Visit
Admission: RE | Admit: 2019-01-20 | Discharge: 2019-01-20 | Disposition: A | Discharge status: Home / Self Care | Payer: Medicare Other | Source: Ambulatory Visit | Attending: Radiation Oncology | Admitting: Radiation Oncology

## 2019-01-20 DIAGNOSIS — Z510 Encounter for antineoplastic radiation therapy: Secondary | ICD-10-CM | POA: Diagnosis not present

## 2019-01-21 ENCOUNTER — Other Ambulatory Visit: Payer: Self-pay

## 2019-01-21 ENCOUNTER — Ambulatory Visit
Admission: RE | Admit: 2019-01-21 | Discharge: 2019-01-21 | Disposition: A | Discharge status: Home / Self Care | Payer: Medicare Other | Source: Ambulatory Visit | Attending: Radiation Oncology | Admitting: Radiation Oncology

## 2019-01-21 DIAGNOSIS — Z510 Encounter for antineoplastic radiation therapy: Secondary | ICD-10-CM | POA: Diagnosis not present

## 2019-01-25 ENCOUNTER — Ambulatory Visit
Admission: RE | Admit: 2019-01-25 | Discharge: 2019-01-25 | Disposition: A | Discharge status: Home / Self Care | Payer: Medicare Other | Source: Ambulatory Visit | Attending: Radiation Oncology | Admitting: Radiation Oncology

## 2019-01-25 ENCOUNTER — Other Ambulatory Visit: Payer: Self-pay

## 2019-01-25 DIAGNOSIS — C61 Malignant neoplasm of prostate: Secondary | ICD-10-CM | POA: Insufficient documentation

## 2019-01-25 DIAGNOSIS — Z510 Encounter for antineoplastic radiation therapy: Secondary | ICD-10-CM | POA: Insufficient documentation

## 2019-01-26 ENCOUNTER — Other Ambulatory Visit: Payer: Self-pay

## 2019-01-26 ENCOUNTER — Ambulatory Visit
Admission: RE | Admit: 2019-01-26 | Discharge: 2019-01-26 | Disposition: A | Discharge status: Home / Self Care | Payer: Medicare Other | Source: Ambulatory Visit | Attending: Radiation Oncology | Admitting: Radiation Oncology

## 2019-01-26 DIAGNOSIS — Z510 Encounter for antineoplastic radiation therapy: Secondary | ICD-10-CM | POA: Diagnosis not present

## 2019-01-27 ENCOUNTER — Ambulatory Visit
Admission: RE | Admit: 2019-01-27 | Discharge: 2019-01-27 | Disposition: A | Discharge status: Home / Self Care | Payer: Medicare Other | Source: Ambulatory Visit | Attending: Radiation Oncology | Admitting: Radiation Oncology

## 2019-01-27 ENCOUNTER — Other Ambulatory Visit: Payer: Self-pay

## 2019-01-27 DIAGNOSIS — Z510 Encounter for antineoplastic radiation therapy: Secondary | ICD-10-CM | POA: Diagnosis not present

## 2019-01-28 ENCOUNTER — Ambulatory Visit
Admission: RE | Admit: 2019-01-28 | Discharge: 2019-01-28 | Disposition: A | Discharge status: Home / Self Care | Payer: Medicare Other | Source: Ambulatory Visit | Attending: Radiation Oncology | Admitting: Radiation Oncology

## 2019-01-28 ENCOUNTER — Other Ambulatory Visit: Payer: Self-pay

## 2019-01-28 DIAGNOSIS — Z510 Encounter for antineoplastic radiation therapy: Secondary | ICD-10-CM | POA: Diagnosis not present

## 2019-01-29 ENCOUNTER — Ambulatory Visit
Admission: RE | Admit: 2019-01-29 | Discharge: 2019-01-29 | Disposition: A | Discharge status: Home / Self Care | Payer: Medicare Other | Source: Ambulatory Visit | Attending: Radiation Oncology | Admitting: Radiation Oncology

## 2019-01-29 ENCOUNTER — Other Ambulatory Visit: Payer: Self-pay

## 2019-01-29 DIAGNOSIS — Z510 Encounter for antineoplastic radiation therapy: Secondary | ICD-10-CM | POA: Diagnosis not present

## 2019-02-01 ENCOUNTER — Ambulatory Visit
Admission: RE | Admit: 2019-02-01 | Discharge: 2019-02-01 | Disposition: A | Discharge status: Home / Self Care | Payer: Medicare Other | Source: Ambulatory Visit | Attending: Radiation Oncology | Admitting: Radiation Oncology

## 2019-02-01 ENCOUNTER — Other Ambulatory Visit: Payer: Self-pay

## 2019-02-01 DIAGNOSIS — Z510 Encounter for antineoplastic radiation therapy: Secondary | ICD-10-CM | POA: Diagnosis not present

## 2019-02-02 ENCOUNTER — Other Ambulatory Visit: Payer: Self-pay

## 2019-02-02 ENCOUNTER — Other Ambulatory Visit: Payer: Self-pay | Admitting: Family Medicine

## 2019-02-02 ENCOUNTER — Ambulatory Visit
Admission: RE | Admit: 2019-02-02 | Discharge: 2019-02-02 | Disposition: A | Discharge status: Home / Self Care | Payer: Medicare Other | Source: Ambulatory Visit | Attending: Radiation Oncology | Admitting: Radiation Oncology

## 2019-02-02 DIAGNOSIS — Z510 Encounter for antineoplastic radiation therapy: Secondary | ICD-10-CM | POA: Diagnosis not present

## 2019-02-02 DIAGNOSIS — M5416 Radiculopathy, lumbar region: Secondary | ICD-10-CM

## 2019-02-02 NOTE — Telephone Encounter (Signed)
Requested medication (s) are due for refill today:yes  Requested medication (s) are on the active medication list: yes  Last refill: 10/15/2018  #20  0 refills  Future visit scheduled yes  Notes to clinic:Not delegated  Requested Prescriptions  Pending Prescriptions Disp Refills   HYDROcodone-acetaminophen ([place]) 10-325 MG tablet [Pharmacy Med Name: HYDROCODON-APAP 10-325 10-325 Tablet] 20 tablet 0    Sig: TAKE 1 TABLET BY MOUTH EVERY 6 (SIX) HOURS AS NEEDED FOR MODERATE PAIN.      Not Delegated - Analgesics:  Opioid Agonist Combinations Failed - 02/02/2019  2:46 PM      Failed - This refill cannot be delegated      Failed - Urine Drug Screen completed in last 360 days.      Passed - Valid encounter within last 6 months    Recent Outpatient Visits           1 month ago Right ear impacted cerumen   [facility], Lilia Argue, MD   2 months ago Medicare annual wellness visit, subsequent   Primary Care at [facility], [place] A, MD   3 months ago Type 2 diabetes mellitus with other circulatory complications [facility])   [facility] Care at [facility], Lilia Argue, MD   6 months ago Pre-op evaluation   [facility] at [facility], Lilia Argue, MD   9 months ago History of prostate cancer   Primary Care at [facility], Lilia Argue, MD       Future Appointments             In 3 months Rutherford Guys, MD Primary Care at [place], [facility]

## 2019-02-03 ENCOUNTER — Other Ambulatory Visit: Payer: Self-pay

## 2019-02-03 ENCOUNTER — Encounter: Payer: Self-pay | Admitting: Radiation Oncology

## 2019-02-03 ENCOUNTER — Ambulatory Visit: Payer: Medicare Other

## 2019-02-03 ENCOUNTER — Ambulatory Visit
Admission: RE | Admit: 2019-02-03 | Discharge: 2019-02-03 | Disposition: A | Discharge status: Home / Self Care | Payer: Medicare Other | Source: Ambulatory Visit | Attending: Radiation Oncology | Admitting: Radiation Oncology

## 2019-02-03 DIAGNOSIS — Z510 Encounter for antineoplastic radiation therapy: Secondary | ICD-10-CM | POA: Diagnosis not present

## 2019-02-03 NOTE — Telephone Encounter (Signed)
Patient is requesting a refill of the following medications: Requested Prescriptions   Pending Prescriptions Disp Refills   HYDROcodone-acetaminophen ([place]) 10-325 MG tablet [Pharmacy Med Name: HYDROCODON-APAP 10-325 10-325 Tablet] 20 tablet 0    Sig: TAKE 1 TABLET BY MOUTH EVERY 6 (SIX) HOURS AS NEEDED FOR MODERATE PAIN.    Date of patient request: 02/02/19 Last office visit: 01/01/19 Date of last refill: 10/15/18 Last refill amount:20 Follow up time period per chart: 05/03/19

## 2019-02-04 ENCOUNTER — Ambulatory Visit: Payer: Medicare Other

## 2019-02-04 MED FILL — HYDROCODON-APAP 10-325: 10-325 | 5 days supply | Qty: 20 | Fill #0

## 2019-02-04 NOTE — Telephone Encounter (Signed)
pmp reviewd, appropriate meds refilled 

## 2019-02-11 ENCOUNTER — Telehealth: Payer: Self-pay | Admitting: Pulmonary Disease

## 2019-02-11 NOTE — Telephone Encounter (Signed)
LMTCB x 1 

## 2019-02-11 NOTE — Telephone Encounter (Signed)
Patient requires surgical clearance for arthroscopy  He was seen in September 2020, supposed to follow-up in 3 months  Needs to be seen in the office prior to surgical clearance Ms. see myself or one of the APPs

## 2019-02-12 NOTE — Telephone Encounter (Signed)
LMTCB x2. I have the clearance form in my notebook to hold on to until patient is seen in the office.

## 2019-02-14 NOTE — Progress Notes (Signed)
  Radiation Oncology         [phone] ________________________________  Name: Alexander [name] MRN: [id number]  Date: 02/03/2019  DOB: 1945-02-07  End of Treatment Note  Diagnosis:   74 y.o. gentleman with biochemically recurrent prostate cancer with a current PSA of 1.06 s/p RALP in 2006 for pT2cNx, Gleason 3+3      Indication for treatment:  Curative, Definitive Radiotherapy       Radiation treatment dates:   12/09/18-02/03/19  Site/dose:  1. The prostate fossa and pelvic lymph nodes were initially treated to 45 Gy in 25 fractions of 1.8 Gy  2. The prostate fossa only was boosted to 68.4 Gy with 13 additional fractions of 1.8 Gy   Beams/energy:  1. The prostate fossa  and pelvic lymph nodes were initially treated using VMAT intensity modulated radiotherapy delivering 6 megavolt photons. Image guidance was performed with CB-CT studies prior to each fraction. He was immobilized with a body fix lower extremity mold.  2. The prostate fossa only was boosted using VMAT intensity modulated radiotherapy delivering 6 megavolt photons. Image guidance was performed with CB-CT studies prior to each fraction. He was immobilized with a body fix lower extremity mold.  Narrative: The patient tolerated radiation treatment relatively well.   The patient experienced some minor urinary irritation and modest fatigue.    Plan: The patient has completed radiation treatment. He will return to radiation oncology clinic for routine followup in one month. I advised him to call or return sooner if he has any questions or concerns related to his recovery or treatment. ________________________________  Sheral Apley. Tammi Klippel, M.D.

## 2019-02-16 NOTE — Telephone Encounter (Signed)
ATC patient. LMTCB. Surgical clearance form is in AO's folder on A pod.

## 2019-02-16 NOTE — Telephone Encounter (Signed)
Can you please continue to reach out to this patient. I have the form here if you want to come grab it.

## 2019-02-22 NOTE — Telephone Encounter (Signed)
ATC patient.  LMTCB. 

## 2019-02-26 ENCOUNTER — Other Ambulatory Visit: Payer: Self-pay | Admitting: Family Medicine

## 2019-02-26 DIAGNOSIS — M5416 Radiculopathy, lumbar region: Secondary | ICD-10-CM

## 2019-02-26 NOTE — Telephone Encounter (Signed)
Requested medication (s) are due for refill today: yes  Requested medication (s) are on the active medication list: yes  Last refill: 02/04/19  Future visit scheduled: yes  Notes to clinic:  not delegated    Requested Prescriptions  Pending Prescriptions Disp Refills   HYDROcodone-acetaminophen ([place]) 10-325 MG tablet [Pharmacy Med Name: HYDROCODON-APAP 10-325 10-325 Tablet] 20 tablet 0    Sig: TAKE 1 TABLET BY MOUTH EVERY 6 (SIX) HOURS AS NEEDED FOR MODERATE PAIN.      Not Delegated - Analgesics:  Opioid Agonist Combinations Failed - 02/26/2019 11:42 AM      Failed - This refill cannot be delegated      Failed - Urine Drug Screen completed in last 360 days.      Passed - Valid encounter within last 6 months    Recent Outpatient Visits           1 month ago Right ear impacted cerumen   [facility] at [facility], Lilia Argue, MD   3 months ago Medicare annual wellness visit, subsequent   Primary Care at [facility], [place] A, MD   3 months ago Type 2 diabetes mellitus with other circulatory complications [facility])   [facility] Care at [facility], Lilia Argue, MD   7 months ago Pre-op evaluation   [facility] Care at [facility], Lilia Argue, MD   9 months ago History of prostate cancer   Primary Care at [facility], Lilia Argue, MD       Future Appointments             In 2 months Rutherford Guys, MD Primary Care at [place], [facility]

## 2019-03-01 MED FILL — HYDROCODON-APAP 10-325: 10-325 | 5 days supply | Qty: 20 | Fill #0

## 2019-03-01 NOTE — Telephone Encounter (Signed)
pmp reviewd, appropriate meds refilled 

## 2019-03-05 ENCOUNTER — Encounter: Payer: Self-pay | Admitting: *Deleted

## 2019-03-05 DIAGNOSIS — M48062 Spinal stenosis, lumbar region with neurogenic claudication: Secondary | ICD-10-CM | POA: Diagnosis not present

## 2019-03-05 NOTE — Telephone Encounter (Signed)
ATC Patient. LMTCB. Surgical clearance form is located on A pod in folder for AO. Letter placed in put going mail for Patient to contact office.  Patient has been unable to reach,  this message will be closed.

## 2019-03-10 ENCOUNTER — Telehealth: Payer: Self-pay

## 2019-03-11 ENCOUNTER — Other Ambulatory Visit: Payer: Self-pay

## 2019-03-11 ENCOUNTER — Ambulatory Visit
Admission: RE | Admit: 2019-03-11 | Discharge: 2019-03-11 | Disposition: A | Discharge status: Home / Self Care | Payer: Medicare Other | Source: Ambulatory Visit | Attending: Urology | Admitting: Urology

## 2019-03-11 DIAGNOSIS — C61 Malignant neoplasm of prostate: Secondary | ICD-10-CM

## 2019-03-11 NOTE — Progress Notes (Signed)
Radiation Oncology         [phone] ________________________________  Name: Alexander [name] MRN: [id number]  Date: 03/11/2019  DOB: May 14, 1945  Post Treatment Note  CC: Rutherford Guys, MD  Rutherford Guys, MD  Diagnosis:   74 y.o. gentleman with biochemically recurrent prostate cancer with a current PSA of 1.06 s/p RALP in 2006 for pT2cNx, Gleason 3+3     Interval Since Last Radiation:  5 weeks  12/09/18-02/03/19: 1. The prostate fossa and pelvic lymph nodes were initially treated to 45 Gy in 25 fractions of 1.8 Gy  2. The prostate fossa only was boosted to 68.4 Gy with 13 additional fractions of 1.8 Gy   Narrative:  I spoke with the patient to conduct his routine scheduled 1 month follow up visit via telephone to spare the patient unnecessary potential exposure in the healthcare setting during the current COVID-19 pandemic.  The patient was notified in advance and gave permission to proceed with this visit format. He tolerated radiation treatment relatively well. The patient experienced some minor urinary irritation, loose bowels and modest fatigue.                               On review of systems, the patient states that he is doing very well overall and without complaints.  He feels that he tolerated treatment very well with only minimal LUTS and occasional diarrhea which has almost completely resolved at this point.  He specifically denies dysuria, gross hematuria, straining to void, excessive daytime frequency, urgency, incomplete bladder emptying or incontinence.  He did notice some fatigue towards the end of treatment which is now resolved.  He reports a healthy appetite and is maintaining his weight.  Overall, he is quite pleased with his progress to date.  ALLERGIES:  is allergic to lisinopril.  Meds: Current Outpatient Medications  Medication Sig Dispense Refill  . amLODipine (NORVASC) 10 MG tablet TAKE 1 TABLET BY MOUTH  DAILY 90 tablet 1  . aspirin EC 81 MG tablet  Take 81 mg by mouth daily as needed.     Marland Kitchen atorvastatin (LIPITOR) 40 MG tablet Take 1 tablet (40 mg total) by mouth daily. 90 tablet 1  . diphenhydrAMINE (BENADRYL) 25 MG tablet Take 25 mg by mouth every 6 (six) hours as needed.    Marland Kitchen EPINEPHrine (EPIPEN 2-PAK) 0.3 mg/0.3 mL IJ SOAJ injection Inject 0.3 mLs (0.3 mg total) into the muscle as needed for anaphylaxis. 2 each 2  . fexofenadine (ALLEGRA) 180 MG tablet Take 1 tablet (180 mg total) by mouth daily. (Patient taking differently: Take 180 mg by mouth daily as needed. ) 30 tablet 5  . fluticasone (FLONASE) 50 MCG/ACT nasal spray Place 2 sprays into both nostrils daily. 16 g 6  . gabapentin (NEURONTIN) 300 MG capsule TAKE 3 CAPSULES BY MOUTH  TWO TIMES DAILY 540 capsule 1  . glipiZIDE (GLUCOTROL) 5 MG tablet Take 1 tablet (5 mg total) by mouth 2 (two) times daily before a meal. 180 tablet 1  . hydrochlorothiazide (HYDRODIURIL) 25 MG tablet Take 1 tablet (25 mg total) by mouth daily. 90 tablet 1  . HYDROcodone-acetaminophen (NORCO) 10-325 MG tablet TAKE 1 TABLET BY MOUTH EVERY 6 (SIX) HOURS AS NEEDED FOR MODERATE PAIN. 20 tablet 0  . meloxicam (MOBIC) 7.5 MG tablet Take 1 tablet (7.5 mg total) by mouth daily as needed. for pain 90 tablet 0  . metFORMIN (GLUCOPHAGE) 1000 MG  tablet Take 1 tablet (1,000 mg total) by mouth 2 (two) times daily with a meal. 180 tablet 3  . montelukast (SINGULAIR) 10 MG tablet Take 10 mg by mouth daily.    . nicotine polacrilex (NICOTINE MINI) 2 MG lozenge Take 1 lozenge (2 mg total) by mouth as needed for smoking cessation. 100 tablet 0  . sildenafil (VIAGRA) 100 MG tablet Take 100 mg by mouth.    Marland Kitchen tiZANidine (ZANAFLEX) 4 MG tablet TAKE 1 TABLET BY MOUTH  EVERY 8 HOURS AS NEEDED FOR MUSCLE SPASMS IN  BACK/LEGS/FEET. 30 tablet 0  . triamcinolone cream (KENALOG) 0.1 % APPLY TOPICALLY TWO TIMES A DAY 45 g 0  . varenicline (CHANTIX STARTING MONTH PAK) 0.5 MG X 11 & 1 MG X 42 tablet Take one 0.5 mg tablet by mouth once daily  for 3 days, then increase to one 0.5 mg tablet twice daily for 4 days, then increase to one 1 mg tablet twice daily. 53 tablet 0  . vitamin C (ASCORBIC ACID) 500 MG tablet Take 500 mg daily by mouth.     No current facility-administered medications for this visit.    Physical Findings:  vitals were not taken for this visit.   /Unable to assess due to telephone follow up visit format.  Lab Findings: Lab Results  Component Value Date   WBC 8.6 08/10/2018   HGB 13.5 08/10/2018   HCT 41.2 08/10/2018   MCV 90 08/10/2018   PLT 230 08/10/2018     Radiographic Findings: No results found.  Impression/Plan: 1. 74 y.o. gentleman with biochemically recurrent prostate cancer with a current PSA of 1.06 s/p RALP in 2006 for pT2cNx, Gleason 3+3.   He will continue to follow up with urology for ongoing PSA determinations and had an appointment with Dr. Jonette Eva on 02/24/19 where his PSA was noted decreased at 0.73, indicating a good early response to treatment. He understands what to expect with regards to PSA monitoring going forward. I will look forward to following his response to treatment via correspondence with urology, and would be happy to continue to participate in his care if clinically indicated. I talked to the patient about what to expect in the future, including his risk for erectile dysfunction and rectal bleeding. I encouraged him to call or return to the office if he has any questions regarding his previous radiation or possible radiation side effects. He was comfortable with this plan and will follow up as needed.  A comprehensive survivorship care plan and treatment summary was mailed to the patient in advance of today's visit detailing his prostate cancer diagnosis, treatment course, potential late/long-term effects of treatment, appropriate follow-up care with recommendations for the future, and patient education resources.  A copy of this summary, along with a letter will be sent to  the patient's urologist and primary care provider via mail/fax/In Basket message after today's visit.  2. Cancer screening:  Due to Mr. Raile history and his age, he should receive screening for skin cancers, colon cancer, and lung cancer.  The information and recommendations are listed on the patient's comprehensive care plan/treatment summary.      3. Health maintenance and wellness promotion: Mr. Spenner was encouraged to consume 5-7 servings of fruits and vegetables per day. He was provided a copy of the "Nutrition [employer]" handout, as well as the handout "Take Control of Your Health and [place]" from the [employer].  He was also encouraged to engage in moderate  to vigorous exercise for 30 minutes per day most days of the week. Information was provided regarding the [facility] fitness program, which is designed for cancer survivors to help them become more physically fit after cancer treatments. We discussed that a healthy BMI is 18.5-24.9 and that maintaining a healthy weight reduces risk of cancer recurrences.  He was instructed to limit his alcohol consumption and was encouraged to stop smoking.      4. Support services/counseling: It is not uncommon for this period of the patient's cancer care trajectory to be one of many emotions and stressors.  Mr. Stoneking was encouraged to take advantage of our many support services programs, support groups, and/or counseling in coping with his new life as a cancer survivor after completing anti-cancer treatment.  He was offered support today through active listening and expressive supportive counseling.  He was given information regarding our available services and encouraged to contact me with any questions or for help enrolling in any of our support group/programs.       Nicholos Johns, PA-C

## 2019-03-18 ENCOUNTER — Other Ambulatory Visit: Payer: Self-pay

## 2019-03-18 ENCOUNTER — Encounter: Payer: Self-pay | Admitting: Family Medicine

## 2019-03-18 ENCOUNTER — Ambulatory Visit (INDEPENDENT_AMBULATORY_CARE_PROVIDER_SITE_OTHER): Payer: Medicare Other | Admitting: Family Medicine

## 2019-03-18 VITALS — BP 126/80 | HR 98 | Temp 98.5°F | Resp 18 | Ht 65.0 in | Wt 136.0 lb

## 2019-03-18 DIAGNOSIS — R202 Paresthesia of skin: Secondary | ICD-10-CM | POA: Diagnosis not present

## 2019-03-18 MED ORDER — FLUTICASONE PROPIONATE 50 MCG/ACT NA SUSP: 2.0000 | Freq: Every day | NASAL | 6 refills | Status: AC

## 2019-03-18 MED ORDER — MELOXICAM 7.5 MG PO TABS: 7.5000 mg | ORAL_TABLET | Freq: Every day | ORAL | 0 refills | Status: DC | PRN

## 2019-03-18 MED ORDER — TRIAMCINOLONE ACETONIDE 0.1 % EX CREA: TOPICAL_CREAM | CUTANEOUS | 0 refills | Status: DC

## 2019-03-18 MED ORDER — TIZANIDINE HCL 4 MG PO TABS: ORAL_TABLET | ORAL | 0 refills | Status: DC

## 2019-03-18 NOTE — Patient Instructions (Addendum)
   If you have lab work done today you will be contacted with your lab results within the next 2 weeks.  If you have not heard from us then please contact us. The fastest way to get your results is to register for My Chart.   IF you received an x-ray today, you will receive an invoice from [facility]. Please contact [facility] at [phone] with questions or concerns regarding your invoice.   IF you received labwork today, you will receive an invoice from [facility]. Please contact [facility] at [phone] with questions or concerns regarding your invoice.   Our billing staff will not be able to assist you with questions regarding bills from these companies.  You will be contacted with the lab results as soon as they are available. The fastest way to get your results is to activate your My Chart account. Instructions are located on the last page of this paperwork. If you have not heard from us regarding the results in 2 weeks, please contact this office.      Carpal Tunnel Syndrome  Carpal tunnel syndrome is a condition that causes pain in your hand and arm. The carpal tunnel is a narrow area that is on the palm side of your wrist. Repeated wrist motion or certain diseases may cause swelling in the tunnel. This swelling can pinch the main nerve in the wrist (median nerve). What are the causes? This condition may be caused by:  Repeated wrist motions.  Wrist injuries.  Arthritis.  A sac of fluid (cyst) or abnormal growth (tumor) in the carpal tunnel.  Fluid buildup during pregnancy. Sometimes the cause is not known. What increases the risk? The following factors may make you more likely to develop this condition:  Having a job in which you move your wrist in the same way many times. This includes jobs like being a butcher or a cashier.  Being a woman.  Having other health conditions, such as: ? Diabetes. ? Obesity. ? A thyroid gland that is not active  enough (hypothyroidism). ? Kidney failure. What are the signs or symptoms? Symptoms of this condition include:  A tingling feeling in your fingers.  Tingling or a loss of feeling (numbness) in your hand.  Pain in your entire arm. This pain may get worse when you bend your wrist and elbow for a long time.  Pain in your wrist that goes up your arm to your shoulder.  Pain that goes down into your palm or fingers.  A weak feeling in your hands. You may find it hard to grab and hold items. You may feel worse at night. How is this diagnosed? This condition is diagnosed with a medical history and physical exam. You may also have tests, such as:  Electromyogram (EMG). This test checks the signals that the nerves send to the muscles.  Nerve conduction study. This test checks how well signals pass through your nerves.  Imaging tests, such as X-rays, ultrasound, and MRI. These tests check for what might be the cause of your condition. How is this treated? This condition may be treated with:  Lifestyle changes. You will be asked to stop or change the activity that caused your problem.  Doing exercise and activities that make bones and muscles stronger (physical therapy).  Learning how to use your hand again (occupational therapy).  Medicines for pain and swelling (inflammation). You may have injections in your wrist.  A wrist splint.  Surgery. Follow these instructions at home:   If you have a splint:  Wear the splint as told by your doctor. Remove it only as told by your doctor.  Loosen the splint if your fingers: ? Tingle. ? Lose feeling (become numb). ? Turn cold and blue.  Keep the splint clean.  If the splint is not waterproof: ? Do not let it get wet. ? Cover it with a watertight covering when you take a bath or a shower. Managing pain, stiffness, and swelling   If told, put ice on the painful area: ? If you have a removable splint, remove it as told by your  doctor. ? Put ice in a plastic bag. ? Place a towel between your skin and the bag. ? Leave the ice on for 20 minutes, 2-3 times per day. General instructions  Take over-the-counter and prescription medicines only as told by your doctor.  Rest your wrist from any activity that may cause pain. If needed, talk with your boss at work about changes that can help your wrist heal.  Do any exercises as told by your doctor, physical therapist, or occupational therapist.  Keep all follow-up visits as told by your doctor. This is important. Contact a doctor if:  You have new symptoms.  Medicine does not help your pain.  Your symptoms get worse. Get help right away if:  You have very bad numbness or tingling in your wrist or hand. Summary  Carpal tunnel syndrome is a condition that causes pain in your hand and arm.  It is often caused by repeated wrist motions.  Lifestyle changes and medicines are used to treat this problem. Surgery may help in very bad cases.  Follow your doctor's instructions about wearing a splint, resting your wrist, keeping follow-up visits, and calling for help. This information is not intended to replace advice given to you by your health care provider. Make sure you discuss any questions you have with your health care provider. Document Revised: 05/16/2017 Document Reviewed: 05/16/2017 Elsevier Patient Education  2020 [employer]  

## 2019-03-18 NOTE — Progress Notes (Signed)
2/25/20214:11 PM  CALLIN GRISE 1945/03/30, 74 y.o., male [id number]  Chief Complaint  Patient presents with  . R Hand numbness    for a month , small knot on back of hand  . foot cramps    for about weeks, usually while in bed     HPI:   Patient is a 74 y.o. male with past medical history significant for DM2, HTN, DDD lumbar spine, PAD, prostate cancer who presents today for right hand numbness   Has noticed for past month numbness tingling of thumb, 2nd and 3rd fingers of right hand Constant Has dropped objects He states gabapentin helps some Right handed  Works Government social research officer  Leg cramps and pain much better after he had epidural injections done to his back  Depression screen [facility] 2/9 03/18/2019 01/01/2019 11/11/2018  Decreased Interest 0 0 0  Down, Depressed, Hopeless 0 0 0  PHQ - 2 Score 0 0 0    Fall Risk  03/18/2019 01/01/2019 11/11/2018 11/02/2018 07/31/2018  Falls in the past year? 0 0 0 0 0  Number falls in past yr: 0 0 0 0 0  Injury with Fall? 0 0 0 0 0  Follow up Falls evaluation completed - Falls evaluation completed;Education provided;Falls prevention discussed - -     Allergies  Allergen Reactions  . Lisinopril Swelling    angioedema    Prior to Admission medications   Medication Sig Start Date End Date Taking? Authorizing Provider  amLODipine (NORVASC) 10 MG tablet TAKE 1 TABLET BY MOUTH  DAILY 11/26/17  Yes Shawnee Knapp, MD  aspirin EC 81 MG tablet Take 81 mg by mouth daily as needed.    Yes [provider]  atorvastatin (LIPITOR) 40 MG tablet Take 1 tablet (40 mg total) by mouth daily. 12/20/17  Yes Shawnee Knapp, MD  diphenhydrAMINE (BENADRYL) 25 MG tablet Take 25 mg by mouth every 6 (six) hours as needed.   Yes [provider]  EPINEPHrine (EPIPEN 2-PAK) 0.3 mg/0.3 mL IJ SOAJ injection Inject 0.3 mLs (0.3 mg total) into the muscle as needed for anaphylaxis. 08/10/18  Yes Bobbitt, Sedalia Muta, MD  fexofenadine (ALLEGRA) 180 MG  tablet Take 1 tablet (180 mg total) by mouth daily. Patient taking differently: Take 180 mg by mouth daily as needed.  08/10/18  Yes Bobbitt, Sedalia Muta, MD  fluticasone [facility]) 50 MCG/ACT nasal spray Place 2 sprays into both nostrils daily. 08/10/18  Yes Bobbitt, Sedalia Muta, MD  gabapentin (NEURONTIN) 300 MG capsule TAKE 3 CAPSULES BY MOUTH  TWO TIMES DAILY 05/05/18  Yes Shawnee Knapp, MD  hydrochlorothiazide (HYDRODIURIL) 25 MG tablet Take 1 tablet (25 mg total) by mouth daily. 01/05/19  Yes Rutherford Guys, MD  HYDROcodone-acetaminophen (NORCO) 10-325 MG tablet TAKE 1 TABLET BY MOUTH EVERY 6 (SIX) HOURS AS NEEDED FOR MODERATE PAIN. 03/01/19  Yes Rutherford Guys, MD  metFORMIN (GLUCOPHAGE) 1000 MG tablet Take 1 tablet (1,000 mg total) by mouth 2 (two) times daily with a meal. 07/07/17  Yes Shawnee Knapp, MD  glipiZIDE (GLUCOTROL) 5 MG tablet Take 1 tablet (5 mg total) by mouth 2 (two) times daily before a meal. Patient not taking: Reported on 03/18/2019 11/02/18   Rutherford Guys, MD  meloxicam (MOBIC) 7.5 MG tablet Take 1 tablet (7.5 mg total) by mouth daily as needed. for pain Patient not taking: Reported on 03/18/2019 08/14/18   Rutherford Guys, MD  montelukast (SINGULAIR) 10 MG tablet Take 10 mg  by mouth daily. 12/14/18   [provider]  nicotine polacrilex (NICOTINE MINI) 2 MG lozenge Take 1 lozenge (2 mg total) by mouth as needed for smoking cessation. Patient not taking: Reported on 03/18/2019 10/21/18   Laurin Coder, MD  sildenafil (VIAGRA) 100 MG tablet Take 100 mg by mouth.    [provider]  tiZANidine (ZANAFLEX) 4 MG tablet TAKE 1 TABLET BY MOUTH  EVERY 8 HOURS AS NEEDED FOR MUSCLE SPASMS IN  BACK/LEGS/FEET. Patient not taking: Reported on 03/18/2019 02/05/18   Rutherford Guys, MD  triamcinolone cream (KENALOG) 0.1 % APPLY TOPICALLY TWO TIMES A DAY Patient not taking: Reported on 03/18/2019 11/26/17   Shawnee Knapp, MD  varenicline (CHANTIX STARTING MONTH PAK) 0.5 MG  X 11 & 1 MG X 42 tablet Take one 0.5 mg tablet by mouth once daily for 3 days, then increase to one 0.5 mg tablet twice daily for 4 days, then increase to one 1 mg tablet twice daily. Patient not taking: Reported on 03/18/2019 10/21/18   Laurin Coder, MD  vitamin C (ASCORBIC ACID) 500 MG tablet Take 500 mg daily by mouth.    [provider]    Past Medical History:  Diagnosis Date  . Angio-edema   . Diabetes mellitus without complication ([place])   . Hypertension   . Neuromuscular disorder ([place])   . Peripheral arterial disease ([place]) 11/14/2016   non-occlusive, asymptomatic, seen by vascular surgery Dr. Oneida Alar who rec repeated ABIs annually  . Prostate cancer [facility])     Past Surgical History:  Procedure Laterality Date  . COLON SURGERY     "locked bowel" fixed  . HERNIA REPAIR    . LEFT HEART CATHETERIZATION WITH CORONARY ANGIOGRAM N/A 09/15/2013   Procedure: LEFT HEART CATHETERIZATION WITH CORONARY ANGIOGRAM;  Surgeon: Peter M Martinique, MD;  [facility];  Service: Cardiovascular;  Laterality: N/A;  . PROSTATE SURGERY  2005    Social History   Tobacco Use  . Smoking status: Current Every Day Smoker    Packs/day: 1.00    Years: 55.00    Pack years: 55.00    Types: Cigarettes  . Smokeless tobacco: Never Used  Substance Use Topics  . Alcohol use: No    Family History  Problem Relation Age of Onset  . Diabetes Mother   . Depression Father   . Hyperlipidemia Sister   . Breast cancer Neg Hx   . Prostate cancer Neg Hx   . Colon cancer Neg Hx     ROS Per hpi  OBJECTIVE:  Today's Vitals   03/18/19 1556  BP: 126/80  Pulse: 98  Resp: 18  Temp: 98.5 F (36.9 C)  TempSrc: Temporal  SpO2: 94%  Weight: 136 lb (61.7 kg)  Height: 5\' 5"  (1.651 m)   Body mass index is 22.63 kg/m.   Physical Exam Vitals and nursing note reviewed.  Constitutional:      Appearance: He is well-developed.  HENT:     Head: Normocephalic and atraumatic.  Eyes:      Conjunctiva/sclera: Conjunctivae normal.     Pupils: Pupils are equal, round, and reactive to light.  Pulmonary:     Effort: Pulmonary effort is normal.  Musculoskeletal:     Right wrist: No swelling or deformity. Normal range of motion. Normal pulse.     Left wrist: Normal.     Right hand: Normal strength. Decreased sensation of the median distribution. Normal capillary refill.     Left hand:  Normal.     Cervical back: Neck supple.     Comments: Positive right hand tinel and phalen tests. Negative on left hand.  Skin:    General: Skin is warm and dry.  Neurological:     Mental Status: He is alert and oriented to person, place, and time.     No results found for this or any previous visit (from the past 24 hour(s)).  No results found.   ASSESSMENT and PLAN  1. Paresthesias in right hand Exam is suggestive of carpal tunnel. Discussed conservative measures, use of brace. Cont with gabapentin. Consider referral to ortho.   Other orders - fluticasone (FLONASE) 50 MCG/ACT nasal spray; Place 2 sprays into both nostrils daily. - meloxicam (MOBIC) 7.5 MG tablet; Take 1 tablet (7.5 mg total) by mouth daily as needed. for pain - tiZANidine (ZANAFLEX) 4 MG tablet; TAKE 1 TABLET BY MOUTH  EVERY 8 HOURS AS NEEDED FOR MUSCLE SPASMS IN  BACK/LEGS/FEET. - triamcinolone cream (KENALOG) 0.1 %; APPLY TOPICALLY TWO TIMES A DAY  Return if symptoms worsen or fail to improve.    Rutherford Guys, MD Primary Care at [place], [place] [address] Ph.  [phone] Fax [phone]

## 2019-04-01 NOTE — Telephone Encounter (Signed)
Closing out encounter 

## 2019-04-06 ENCOUNTER — Telehealth: Payer: Self-pay | Admitting: Family Medicine

## 2019-04-16 ENCOUNTER — Other Ambulatory Visit: Payer: Self-pay | Admitting: Family Medicine

## 2019-04-16 MED ORDER — GABAPENTIN 300 MG PO CAPS: ORAL_CAPSULE | ORAL | 0 refills | Status: DC

## 2019-04-16 MED ORDER — AMLODIPINE BESYLATE 10 MG PO TABS: 10.0000 mg | ORAL_TABLET | Freq: Every day | ORAL | 0 refills | Status: DC

## 2019-04-16 NOTE — Telephone Encounter (Signed)
Requested Prescriptions  Pending Prescriptions Disp Refills  . amLODipine (NORVASC) 10 MG tablet 30 tablet 0    Sig: Take 1 tablet (10 mg total) by mouth daily.     Cardiovascular:  Calcium Channel Blockers Passed - 04/16/2019  9:35 AM      Passed - Last BP in normal range    BP Readings from Last 1 Encounters:  03/18/19 126/80         Passed - Valid encounter within last 6 months    Recent Outpatient Visits          4 weeks ago Paresthesias in right hand   [facility], Lilia Argue, MD   3 months ago Right ear impacted cerumen   [facility], Lilia Argue, MD   5 months ago Medicare annual wellness visit, subsequent   [facility] at [facility] [place] A, MD   5 months ago Type 2 diabetes mellitus with other circulatory complications [facility])   [facility], Lilia Argue, MD   8 months ago Pre-op evaluation   [facility] at [facility], Lilia Argue, MD             . gabapentin (NEURONTIN) 300 MG capsule 180 capsule 0    Sig: TAKE 3 CAPSULES BY MOUTH  TWO TIMES DAILY     Neurology: Anticonvulsants - gabapentin Passed - 04/16/2019  9:35 AM      Passed - Valid encounter within last 12 months    Recent Outpatient Visits          4 weeks ago Paresthesias in right hand   [facility], Lilia Argue, MD   3 months ago Right ear impacted cerumen   [facility], Lilia Argue, MD   5 months ago Medicare annual wellness visit, subsequent   Primary Care at [facility], Zoe A, MD   5 months ago Type 2 diabetes mellitus with other circulatory complications [facility])   [facility], Lilia Argue, MD   8 months ago Pre-op evaluation   [facility], Lilia Argue, MD             attempted to call pt.; left vm. To call and schedule 6 mo. F/u appt.  One month courtesy refill given for Amlodipine and Gabapentin.

## 2019-04-16 NOTE — Telephone Encounter (Signed)
Medication Refill - Medication: amLODipine (NORVASC) 10 MG tablet  gabapentin (NEURONTIN) 300 MG capsule   Has the patient contacted their pharmacy? Yes.   (Agent: If no, request that the patient contact the pharmacy for the refill.) (Agent: If yes, when and what did the pharmacy advise?)  Preferred Pharmacy (with phone number or street name):  [facility], [address] [place] [address]  Phone: [phone] Fax: [phone]     Agent: Please be advised that RX refills may take up to 3 business days. We ask that you follow-up with your pharmacy.

## 2019-04-27 ENCOUNTER — Encounter: Payer: Self-pay | Admitting: Family Medicine

## 2019-04-27 ENCOUNTER — Other Ambulatory Visit: Payer: Self-pay

## 2019-04-27 ENCOUNTER — Ambulatory Visit (INDEPENDENT_AMBULATORY_CARE_PROVIDER_SITE_OTHER): Payer: Medicare Other | Admitting: Family Medicine

## 2019-04-27 VITALS — BP 118/64 | HR 77 | Temp 97.7°F | Ht 65.0 in | Wt 136.8 lb

## 2019-04-27 DIAGNOSIS — I10 Essential (primary) hypertension: Secondary | ICD-10-CM | POA: Diagnosis not present

## 2019-04-27 DIAGNOSIS — E1159 Type 2 diabetes mellitus with other circulatory complications: Secondary | ICD-10-CM | POA: Diagnosis not present

## 2019-04-27 DIAGNOSIS — E785 Hyperlipidemia, unspecified: Secondary | ICD-10-CM | POA: Diagnosis not present

## 2019-04-27 DIAGNOSIS — R202 Paresthesia of skin: Secondary | ICD-10-CM

## 2019-04-27 DIAGNOSIS — M5416 Radiculopathy, lumbar region: Secondary | ICD-10-CM | POA: Diagnosis not present

## 2019-04-27 LAB — LIPID PANEL
Chol/HDL Ratio: 3.4 ratio (ref 0.0–5.0)
Cholesterol, Total: 145 mg/dL (ref 100–199)
HDL: 43 mg/dL (ref >39)
LDL Chol Calc (NIH): 86 mg/dL (ref 0–99)
Triglycerides: 82 mg/dL (ref 0–149)
VLDL Cholesterol Cal: 16 mg/dL (ref 5–40)

## 2019-04-27 LAB — CMP14+EGFR
ALT: 35 IU/L (ref 0–44)
AST: 33 IU/L (ref 0–40)
Albumin/Globulin Ratio: 1.4 (ref 1.2–2.2)
Albumin: 4.1 g/dL (ref 3.7–4.7)
Alkaline Phosphatase: 122 IU/L — ABNORMAL HIGH (ref 39–117)
BUN/Creatinine Ratio: 13 (ref 10–24)
BUN: 14 mg/dL (ref 8–27)
Bilirubin Total: 0.2 mg/dL (ref 0.0–1.2)
CO2: 22 mmol/L (ref 20–29)
Calcium: 9.6 mg/dL (ref 8.6–10.2)
Chloride: 104 mmol/L (ref 96–106)
Creatinine, Ser: 1.08 mg/dL (ref 0.76–1.27)
GFR calc Af Amer: 78 mL/min/{1.73_m2} (ref >59)
GFR calc non Af Amer: 68 mL/min/{1.73_m2} (ref >59)
Globulin, Total: 3 g/dL (ref 1.5–4.5)
Glucose: 107 mg/dL — ABNORMAL HIGH (ref 65–99)
Potassium: 3.8 mmol/L (ref 3.5–5.2)
Sodium: 139 mmol/L (ref 134–144)
Total Protein: 7.1 g/dL (ref 6.0–8.5)

## 2019-04-27 LAB — HEMOGLOBIN A1C
Est. average glucose Bld gHb Est-mCnc: 137 mg/dL
Hgb A1c MFr Bld: 6.4 % — ABNORMAL HIGH (ref 4.8–5.6)

## 2019-04-27 MED ORDER — AMLODIPINE BESYLATE 10 MG PO TABS: 10.0000 mg | ORAL_TABLET | Freq: Every day | ORAL | 1 refills | Status: DC

## 2019-04-27 MED ORDER — GABAPENTIN 300 MG PO CAPS: ORAL_CAPSULE | ORAL | 1 refills | Status: DC

## 2019-04-27 MED ORDER — HYDROCODONE-ACETAMINOPHEN 10-325 MG PO TABS: ORAL_TABLET | ORAL | 0 refills | Status: DC

## 2019-04-27 MED ORDER — METFORMIN HCL 1000 MG PO TABS: 1000.0000 mg | ORAL_TABLET | Freq: Two times a day (BID) | ORAL | 1 refills | Status: DC

## 2019-04-27 MED ORDER — GLIPIZIDE 5 MG PO TABS: 5.0000 mg | ORAL_TABLET | Freq: Two times a day (BID) | ORAL | 1 refills | Status: DC

## 2019-04-27 MED ORDER — HYDROCHLOROTHIAZIDE 25 MG PO TABS: 25.0000 mg | ORAL_TABLET | Freq: Every day | ORAL | 1 refills | Status: DC

## 2019-04-27 MED ORDER — ATORVASTATIN CALCIUM 40 MG PO TABS: 40.0000 mg | ORAL_TABLET | Freq: Every day | ORAL | 1 refills | Status: DC

## 2019-04-27 MED FILL — metFORMIN HCL 1000 MG TABS: 1000 | 90 days supply | Qty: 180 | Fill #0

## 2019-04-27 MED FILL — HYDROCHLOROTHIAZIDE 25 MG T: 25 | 90 days supply | Qty: 90 | Fill #0

## 2019-04-27 MED FILL — HYDROCODON-APAP 10-325: 10-325 | 5 days supply | Qty: 20 | Fill #0

## 2019-04-27 MED FILL — ATORVASTATIN 40 MG TABLET: 40 | 90 days supply | Qty: 90 | Fill #0

## 2019-04-27 NOTE — Patient Instructions (Signed)
° ° ° °  If you have lab work done today you will be contacted with your lab results within the next 2 weeks.  If you have not heard from us then please contact us. The fastest way to get your results is to register for My Chart. ° ° °IF you received an x-ray today, you will receive an invoice from [facility]. Please contact [facility] at [phone] with questions or concerns regarding your invoice.  ° °IF you received labwork today, you will receive an invoice from [facility]. Please contact [facility] at [phone] with questions or concerns regarding your invoice.  ° °Our billing staff will not be able to assist you with questions regarding bills from these companies. ° °You will be contacted with the lab results as soon as they are available. The fastest way to get your results is to activate your My Chart account. Instructions are located on the last page of this paperwork. If you have not heard from us regarding the results in 2 weeks, please contact this office. °  ° ° ° °

## 2019-04-27 NOTE — Progress Notes (Signed)
4/6/202110:13 AM  Alexander [name] March 10, 1945, 74 y.o., male [id number]  Chief Complaint  Patient presents with  . Diabetes  . Hypertension  . Hyperlipidemia    HPI:   Patient is a 73 y.o. male with past medical history significant for DM2, HTN, DDD lumbar spine, PAD, prostate cancer who presents today for routine followup  Last OV feb 2021 - RUE paresthesia, ? Carpal tunnel? Conservative measures  Patient overall doing well Does not check BP or cbg at home He denies any ssx of low cbgs Denies any polyuria or polydipsia Has completed radiation Not using chantix - feels it was not working, he is trying to cut back on his own Requesting refill for vicodin - pmp reviewed, uses prn for DDD lumbar back pain, mostly manages with gabapentin BID and meloxicam prn, did not tolerate zanaflex Requesting referral to ortho for his right hand   Lab Results  Component Value Date   HGBA1C 7.2 (A) 11/02/2018   HGBA1C 7.4 (H) 07/31/2018   HGBA1C 7.1 (H) 04/27/2018   Lab Results  Component Value Date   MICROALBUR 3.2 04/20/2015   LDLCALC 47 11/02/2018   CREATININE 1.04 11/02/2018    Depression screen PHQ 2/9 04/27/2019 03/18/2019 01/01/2019  Decreased Interest 0 0 0  Down, Depressed, Hopeless 0 0 0  PHQ - 2 Score 0 0 0    Fall Risk  04/27/2019 03/18/2019 01/01/2019 11/11/2018 11/02/2018  Falls in the past year? 0 0 0 0 0  Number falls in past yr: 0 0 0 0 0  Injury with Fall? 0 0 0 0 0  Follow up - Falls evaluation completed - Falls evaluation completed;Education provided;Falls prevention discussed -     Allergies  Allergen Reactions  . Lisinopril Swelling    angioedema    Prior to Admission medications   Medication Sig Start Date End Date Taking? Authorizing Provider  amLODipine (NORVASC) 10 MG tablet Take 1 tablet (10 mg total) by mouth daily. 04/16/19  Yes Rutherford Guys, MD  aspirin EC 81 MG tablet Take 81 mg by mouth daily as needed.    Yes [provider]    atorvastatin (LIPITOR) 40 MG tablet Take 1 tablet (40 mg total) by mouth daily. 12/20/17  Yes Shawnee Knapp, MD  diphenhydrAMINE (BENADRYL) 25 MG tablet Take 25 mg by mouth every 6 (six) hours as needed.   Yes [provider]  EPINEPHrine (EPIPEN 2-PAK) 0.3 mg/0.3 mL IJ SOAJ injection Inject 0.3 mLs (0.3 mg total) into the muscle as needed for anaphylaxis. 08/10/18  Yes Bobbitt, [place] Muta, MD  fexofenadine (ALLEGRA) 180 MG tablet Take 1 tablet (180 mg total) by mouth daily. Patient taking differently: Take 180 mg by mouth daily as needed.  08/10/18  Yes Bobbitt, Sedalia Muta, MD  fluticasone [facility]) 50 MCG/ACT nasal spray Place 2 sprays into both nostrils daily. 03/18/19  Yes Rutherford Guys, MD  gabapentin (NEURONTIN) 300 MG capsule TAKE 3 CAPSULES BY MOUTH  TWO TIMES DAILY 04/16/19  Yes Rutherford Guys, MD  glipiZIDE (GLUCOTROL) 5 MG tablet Take 1 tablet (5 mg total) by mouth 2 (two) times daily before a meal. 11/02/18  Yes Rutherford Guys, MD  hydrochlorothiazide (HYDRODIURIL) 25 MG tablet Take 1 tablet (25 mg total) by mouth daily. 01/05/19  Yes Rutherford Guys, MD  HYDROcodone-acetaminophen (NORCO) 10-325 MG tablet TAKE 1 TABLET BY MOUTH EVERY 6 (SIX) HOURS AS NEEDED FOR MODERATE PAIN. 03/01/19  Yes Rutherford Guys, MD  meloxicam (  MOBIC) 7.5 MG tablet Take 1 tablet (7.5 mg total) by mouth daily as needed. for pain 03/18/19  Yes Rutherford Guys, MD  metFORMIN (GLUCOPHAGE) 1000 MG tablet Take 1 tablet (1,000 mg total) by mouth 2 (two) times daily with a meal. 07/07/17  Yes Shawnee Knapp, MD  montelukast (SINGULAIR) 10 MG tablet Take 10 mg by mouth daily. 12/14/18  Yes [provider]  nicotine polacrilex (NICOTINE MINI) 2 MG lozenge Take 1 lozenge (2 mg total) by mouth as needed for smoking cessation. 10/21/18  Yes Olalere, Adewale A, MD  sildenafil (VIAGRA) 100 MG tablet Take 100 mg by mouth.   Yes [provider]  tiZANidine (ZANAFLEX) 4 MG tablet TAKE 1 TABLET BY MOUTH   EVERY 8 HOURS AS NEEDED FOR MUSCLE SPASMS IN  BACK/LEGS/FEET. 03/18/19  Yes Rutherford Guys, MD  triamcinolone cream (KENALOG) 0.1 % APPLY TOPICALLY TWO TIMES A DAY 03/18/19  Yes Rutherford Guys, MD  varenicline (CHANTIX STARTING MONTH PAK) 0.5 MG X 11 & 1 MG X 42 tablet Take one 0.5 mg tablet by mouth once daily for 3 days, then increase to one 0.5 mg tablet twice daily for 4 days, then increase to one 1 mg tablet twice daily. 10/21/18  Yes Olalere, Adewale A, MD  vitamin C (ASCORBIC ACID) 500 MG tablet Take 500 mg daily by mouth.   Yes [provider]    Past Medical History:  Diagnosis Date  . Angio-edema   . Diabetes mellitus without complication ([place])   . Hypertension   . Neuromuscular disorder ([place])   . Peripheral arterial disease ([place]) 11/14/2016   non-occlusive, asymptomatic, seen by vascular surgery Dr. Oneida Alar who rec repeated ABIs annually  . Prostate cancer [facility])     Past Surgical History:  Procedure Laterality Date  . COLON SURGERY     "locked bowel" fixed  . HERNIA REPAIR    . LEFT HEART CATHETERIZATION WITH CORONARY ANGIOGRAM N/A 09/15/2013   Procedure: LEFT HEART CATHETERIZATION WITH CORONARY ANGIOGRAM;  Surgeon: Peter M Martinique, MD;  Location: [facility] CATH LAB;  Service: Cardiovascular;  Laterality: N/A;  . PROSTATE SURGERY  2005    Social History   Tobacco Use  . Smoking status: Current Every Day Smoker    Packs/day: 1.00    Years: 55.00    Pack years: 55.00    Types: Cigarettes  . Smokeless tobacco: Never Used  Substance Use Topics  . Alcohol use: No    Family History  Problem Relation Age of Onset  . Diabetes Mother   . Depression Father   . Hyperlipidemia Sister   . Breast cancer Neg Hx   . Prostate cancer Neg Hx   . Colon cancer Neg Hx     Review of Systems  Constitutional: Negative for chills and fever.  Respiratory: Negative for cough and shortness of breath.   Cardiovascular: Negative for chest pain, palpitations and leg swelling.   Gastrointestinal: Negative for abdominal pain, blood in stool, constipation, diarrhea, melena, nausea and vomiting.  Genitourinary: Negative for dysuria, frequency, hematuria and urgency.  Endo/Heme/Allergies: Positive for environmental allergies. Negative for polydipsia.   Per hpi  OBJECTIVE:  Today's Vitals   04/27/19 0958  BP: 118/64  Pulse: 77  Temp: 97.7 F (36.5 C)  SpO2: 100%  Weight: 136 lb 12.8 oz (62.1 kg)  Height: 5' 5"  (1.651 m)   Body mass index is 22.76 kg/m.  Wt Readings from Last 3 Encounters:  04/27/19 136 lb 12.8 oz (62.1  kg)  03/18/19 136 lb (61.7 kg)  01/01/19 138 lb 9.6 oz (62.9 kg)    Physical Exam Vitals and nursing note reviewed.  Constitutional:      Appearance: He is well-developed.  HENT:     Head: Normocephalic and atraumatic.  Eyes:     Conjunctiva/sclera: Conjunctivae normal.     Pupils: Pupils are equal, round, and reactive to light.  Cardiovascular:     Rate and Rhythm: Normal rate and regular rhythm.     Heart sounds: No murmur. No friction rub. No gallop.   Pulmonary:     Effort: Pulmonary effort is normal.     Breath sounds: Normal breath sounds. No wheezing or rales.  Musculoskeletal:     Cervical back: Neck supple.  Skin:    General: Skin is warm and dry.  Neurological:     Mental Status: He is alert and oriented to person, place, and time.     No results found for this or any previous visit (from the past 24 hour(s)).  No results found.   ASSESSMENT and PLAN  1. Essential hypertension Controlled. Continue current regime. Strongly encouraged him to quit smoking.  - CMP14+EGFR - Lipid panel  2. Hyperlipidemia LDL goal <70 Checking labs today, medications will be adjusted as needed.  - CMP14+EGFR - Lipid panel  3. Type 2 diabetes mellitus with other circulatory complications (HCC) Checking labs today, medications will be adjusted as needed.  - CMP14+EGFR - Hemoglobin A1c - Lipid panel  4. Paresthesias in  right hand - Ambulatory referral to Orthopedic Surgery  5. Lumbar radiculopathy, chronic stable. Continue current regime. pmp reviewed - HYDROcodone-acetaminophen (NORCO) 10-325 MG tablet; TAKE 1 TABLET BY MOUTH EVERY 6 (SIX) HOURS AS NEEDED FOR MODERATE PAIN.  Other orders - amLODipine (NORVASC) 10 MG tablet; Take 1 tablet (10 mg total) by mouth daily. - atorvastatin (LIPITOR) 40 MG tablet; Take 1 tablet (40 mg total) by mouth daily. - gabapentin (NEURONTIN) 300 MG capsule; TAKE 3 CAPSULES BY MOUTH  TWO TIMES DAILY - glipiZIDE (GLUCOTROL) 5 MG tablet; Take 1 tablet (5 mg total) by mouth 2 (two) times daily before a meal. - hydrochlorothiazide (HYDRODIURIL) 25 MG tablet; Take 1 tablet (25 mg total) by mouth daily. - metFORMIN (GLUCOPHAGE) 1000 MG tablet; Take 1 tablet (1,000 mg total) by mouth 2 (two) times daily with a meal.  Return in about 6 months (around 10/27/2019).    Rutherford Guys, MD Primary Care at [place], [place] [address] Ph.  [phone] Fax [phone]

## 2019-05-03 ENCOUNTER — Ambulatory Visit: Payer: Medicare Other | Admitting: Family Medicine

## 2019-05-06 ENCOUNTER — Encounter: Payer: Self-pay | Admitting: Orthopaedic Surgery

## 2019-05-06 ENCOUNTER — Other Ambulatory Visit: Payer: Self-pay

## 2019-05-06 ENCOUNTER — Ambulatory Visit: Payer: Medicare Other | Admitting: Orthopaedic Surgery

## 2019-05-06 DIAGNOSIS — G5601 Carpal tunnel syndrome, right upper limb: Secondary | ICD-10-CM | POA: Diagnosis not present

## 2019-05-06 NOTE — Progress Notes (Signed)
Office Visit Note   Patient: Alexander [name]           Date of Birth: Aug 14, 1945           MRN: [id number] Visit Date: 05/06/2019              Requested by: Rutherford Guys, MD [address] [place],  [place] [address] PCP: Rutherford Guys, MD   Assessment & Plan: Visit Diagnoses:  1. Carpal tunnel syndrome, right upper limb     Plan: Impression is likely right carpal tunnel syndrome.  At this point, we will refer him to Dr. Ernestina Patches for nerve conduction study/EMG.  He will follow up with [place] once has been completed.  Follow-Up Instructions: Return for after NCS/EMG.   Orders:  No orders of the defined types were placed in this encounter.  No orders of the defined types were placed in this encounter.     Procedures: No procedures performed   Clinical Data: No additional findings.   Subjective: Chief Complaint  Patient presents with  . Right Hand - Pain    HPI patient is a pleasant 74 year old right-hand-dominant gentleman who presents our clinic today with numbness, tingling and burning to the right hand and fingers.  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  He noticed the symptoms a few months ago.  His symptoms are primarily to all 5 fingers but worse in the thumb, index and long fingers.  His symptoms are exacerbated while working.  He has occasionally wear a brace at night which does seem to help.  He has tried gabapentin which does not seem to make a difference.  He has recently started noticing weakness to the right hand.  No history of cervical spine pathology.  He is a type II diabetic but does not have a history of neuropathy.  Review of Systems as detailed in HPI.  All others reviewed and are negative.   Objective: Vital Signs: There were no vitals taken for this visit.  Physical Exam well-developed well-nourished gentleman in no acute distress.  Alert oriented x3.  Ortho Exam examination of the right hand reveals a minimally positive  [place].  Negative Tinel at the wrist and elbow.  Decreased grip strength.  Slight thenar atrophy.  He has decreased sensation to all 5 fingers.  Specialty Comments:  No specialty comments available.  Imaging: No new imaging   PMFS History: Patient Active Problem List   Diagnosis Date Noted  . Malignant neoplasm of prostate ([place]) 10/27/2018  . Allergic reaction 08/10/2018  . Angioedema 08/10/2018  . Peripheral arterial occlusive disease ([place]) 10/28/2016  . Hyperlipidemia LDL goal <70 05/01/2015  . Tobacco use disorder 12/09/2013  . 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 Erectile dysfunction due to diseases classified elsewhere 12/09/2013  . Type 2 diabetes mellitus with other circulatory complications ([place]) [id number]  . Left lumbar radiculopathy 09/30/2012  . Chronic rhinitis 04/08/2011  . Hypertension   . HTN (hypertension), benign 03/09/2011  . History of prostate cancer 03/09/2011   Past Medical History:  Diagnosis Date  . Angio-edema   . Diabetes mellitus without complication ([place])   . Hypertension   . Neuromuscular disorder ([place])   . Peripheral arterial disease ([place]) 11/14/2016   non-occlusive, asymptomatic, seen by vascular surgery Dr. Oneida Alar who rec repeated ABIs annually  . Prostate cancer [facility])     Family History  Problem Relation  Age of Onset  . Diabetes Mother   . Depression Father   . Hyperlipidemia Sister   . Breast cancer Neg Hx   . Prostate cancer Neg Hx   . Colon cancer Neg Hx     Past Surgical History:  Procedure Laterality Date  . COLON SURGERY     "locked bowel" fixed  . HERNIA REPAIR    . LEFT HEART CATHETERIZATION WITH CORONARY ANGIOGRAM N/A 09/15/2013   Procedure: LEFT HEART CATHETERIZATION WITH CORONARY ANGIOGRAM;  Surgeon: Peter M Martinique, MD;  Location: [facility] CATH LAB;  Service: Cardiovascular;  Laterality: N/A;  . PROSTATE SURGERY  2005   Social History   Occupational  History  . Occupation: retired  Tobacco Use  . Smoking status: Current Every Day Smoker    Packs/day: 1.00    Years: 55.00    Pack years: 55.00    Types: Cigarettes  . Smokeless tobacco: Never Used  Substance and Sexual Activity  . Alcohol use: No  . Drug use: No  . Sexual activity: Yes

## 2019-05-08 ENCOUNTER — Other Ambulatory Visit: Payer: Self-pay | Admitting: Family Medicine

## 2019-05-14 ENCOUNTER — Ambulatory Visit: Payer: Medicare Other | Admitting: Orthopaedic Surgery

## 2019-05-19 ENCOUNTER — Other Ambulatory Visit: Payer: Self-pay | Admitting: Family Medicine

## 2019-05-19 NOTE — Telephone Encounter (Signed)
Requested Prescriptions  Pending Prescriptions Disp Refills  . meloxicam (MOBIC) 7.5 MG tablet [Pharmacy Med Name: MELOXICAM  7.5MG   TAB] 90 tablet 1    Sig: TAKE 1 TABLET BY MOUTH  DAILY AS NEEDED FOR PAIN     Analgesics:  COX2 Inhibitors Passed - 05/19/2019  6:10 AM      Passed - HGB in normal range and within 360 days    Hemoglobin  Date Value Ref Range Status  08/10/2018 13.5 13.0 - 17.7 g/dL Final         Passed - Cr in normal range and within 360 days    Creat  Date Value Ref Range Status  08/17/2015 1.15 0.70 - 1.25 mg/dL Final    Comment:      For patients > or = 73 years of age: The upper reference limit for Creatinine is approximately 13% higher for people identified as African-American.      Creatinine, Ser  Date Value Ref Range Status  04/27/2019 1.08 0.76 - 1.27 mg/dL Final   Creatinine, Urine  Date Value Ref Range Status  04/20/2015 28 20 - 370 mg/dL Final         Passed - Patient is not pregnant      Passed - Valid encounter within last 12 months    Recent Outpatient Visits          3 weeks ago Essential hypertension   [facility], Lilia Argue, MD   2 months ago Paresthesias in right hand   [facility], Lilia Argue, MD   4 months ago Right ear impacted cerumen   [facility], Lilia Argue, MD   6 months ago Medicare annual wellness visit, subsequent   Primary Care at [facility], [place] A, MD   6 months ago Type 2 diabetes mellitus with other circulatory complications [facility])   [facility], Lilia Argue, MD      Future Appointments            In 2 weeks Leandrew Koyanagi, MD [facility]   In 5 months Rutherford Guys, MD Primary Care at [place], [facility]

## 2019-05-21 ENCOUNTER — Encounter: Payer: Self-pay | Admitting: Emergency Medicine

## 2019-05-21 NOTE — Progress Notes (Signed)
Lab letter has been mailed

## 2019-05-25 ENCOUNTER — Telehealth: Payer: Self-pay | Admitting: Family Medicine

## 2019-05-25 MED FILL — AMLODIPINE BESYLATE 10 MG T: 10 | 90 days supply | Qty: 90 | Fill #0

## 2019-05-25 MED FILL — GABAPENTIN 300 MG CAPSULE: 300 | 90 days supply | Qty: 540 | Fill #0

## 2019-05-25 NOTE — Telephone Encounter (Signed)
Disregard

## 2019-06-04 ENCOUNTER — Encounter: Payer: Self-pay | Admitting: Physical Medicine and Rehabilitation

## 2019-06-04 ENCOUNTER — Other Ambulatory Visit: Payer: Self-pay

## 2019-06-04 ENCOUNTER — Ambulatory Visit (INDEPENDENT_AMBULATORY_CARE_PROVIDER_SITE_OTHER): Payer: Medicare Other | Admitting: Physical Medicine and Rehabilitation

## 2019-06-04 DIAGNOSIS — R202 Paresthesia of skin: Secondary | ICD-10-CM | POA: Diagnosis not present

## 2019-06-04 NOTE — Progress Notes (Signed)
Pt is here for a NCS and states numbness and tingling in right hand (middle finger) with some pain in right hand. Pt states symptoms started 3 months ago. Working and using right hand makes it worse. Nothing helps with pain. Right hand dominant. Pt hands washed.   .Numeric Pain Rating Scale and Functional Assessment Average Pain 10   In the last MONTH (on 0-10 scale) has pain interfered with the following?  1. General activity like being  able to carry out your everyday physical activities such as walking, climbing stairs, carrying groceries, or moving a chair?  Rating(9)

## 2019-06-07 NOTE — Procedures (Signed)
EMG & NCV Findings: Evaluation of the right median motor nerve showed prolonged distal onset latency (16.7 ms), reduced amplitude (0.1 mV), and decreased conduction velocity (Elbow-Wrist, 21 m/s).  The right ulnar motor nerve showed decreased conduction velocity (B Elbow-Wrist, 41 m/s).  The right median (across palm) sensory nerve showed prolonged distal peak latency (Wrist, 7.8 ms), reduced amplitude (3.0 V), and prolonged distal peak latency (Palm, 6.1 ms).  The right radial sensory nerve showed prolonged distal peak latency (4.5 ms).  The right ulnar sensory nerve showed prolonged distal peak latency (5.0 ms) and decreased conduction velocity (Wrist-5th Digit, 28 m/s).    All examined muscles (as indicated in the following table) showed no evidence of electrical instability.    Impression: The above electrodiagnostic study is ABNORMAL and reveals evidence of:  1.  A severe right median nerve entrapment at the wrist (carpal tunnel syndrome) affecting sensory and motor components. The lesion is characterized by sensory and motor demyelination with evidence of significant axonal injury.  Despite appropriate decompression there would likely be some residual nerve damage.  2.  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   There is no significant electrodiagnostic evidence of any other focal nerve entrapment or brachial plexopathy or cervical radiculopathy.   Recommendations: 1.  Follow-up with referring physician. 2.  Continue current management of symptoms. 3.  Suggest surgical evaluation.  ___________________________ Laurence Spates FAAPMR Board Certified, American Board of Physical Medicine and Rehabilitation    Nerve Conduction Studies Anti Sensory Summary Table   Stim Site NR Peak (ms) Norm Peak (ms) P-T Amp (V) Norm P-T Amp Site1 Site2 Delta-P (ms) Dist (cm) Vel (m/s) Norm Vel (m/s)  Right  Median Acr Palm Anti Sensory (2nd Digit)  30.5C  Wrist    *7.8 <3.6 *3.0 >10 Wrist Palm 1.7 0.0    Palm    *6.1 <2.0 2.3         Right Radial Anti Sensory (Base 1st Digit)  30.5C  Wrist    *4.5 <3.1 5.1  Wrist Base 1st Digit 4.5 0.0    Right Ulnar Anti Sensory (5th Digit)  30.8C  Wrist    *5.0 <3.7 17.8 >15.0 Wrist 5th Digit 5.0 14.0 *28 >38   Motor Summary Table   Stim Site NR Onset (ms) Norm Onset (ms) O-P Amp (mV) Norm O-P Amp Site1 Site2 Delta-0 (ms) Dist (cm) Vel (m/s) Norm Vel (m/s)  Right Median Motor (Abd Poll Brev)  30.6C  Wrist    *16.7 <4.2 *0.1 >5 Elbow Wrist 9.8 20.3 *21 >50  Elbow    26.5  0.1         Right Ulnar Motor (Abd Dig Min)  30.4C  Wrist    4.2 <4.2 9.1 >3 B Elbow Wrist 4.7 19.5 *41 >53  B Elbow    8.9  9.5  A Elbow B Elbow 1.8 10.0 56 >53  A Elbow    10.7  8.9          EMG   Side Muscle Nerve Root Ins Act Fibs Psw Amp Dur Poly Recrt Int Fraser Din Comment  Right Abd Poll Brev Median C8-T1 Dcr 4+ 4+ Rdc ---- 0 Rdc ---- Rare MUAP  Right 1stDorInt Ulnar C8-T1 Nml Nml Nml Nml Nml 0 Nml Nml   Right PronatorTeres Median C6-7 Nml Nml Nml Nml Nml 0 Nml Nml   Right Biceps Musculocut C5-6 Nml Nml Nml Nml Nml 0 Nml Nml  Right Deltoid Axillary C5-6 Nml Nml Nml Nml Nml 0 Nml Nml     Nerve Conduction Studies Anti Sensory Left/Right Comparison   Stim Site L Lat (ms) R Lat (ms) L-R Lat (ms) L Amp (V) R Amp (V) L-R Amp (%) Site1 Site2 L Vel (m/s) R Vel (m/s) L-R Vel (m/s)  Median Acr Palm Anti Sensory (2nd Digit)  30.5C  Wrist  *7.8   *3.0  Wrist Palm     Palm  *6.1   2.3        Radial Anti Sensory (Base 1st Digit)  30.5C  Wrist  *4.5   5.1  Wrist Base 1st Digit     Ulnar Anti Sensory (5th Digit)  30.8C  Wrist  *5.0   17.8  Wrist 5th Digit  *28    Motor Left/Right Comparison   Stim Site L Lat (ms) R Lat (ms) L-R Lat (ms) L Amp (mV) R Amp (mV) L-R Amp (%) Site1 Site2 L Vel (m/s) R Vel (m/s) L-R Vel (m/s)  Median Motor (Abd Poll Brev)  30.6C  Wrist  *16.7   *0.1   Elbow Wrist  *21   Elbow  26.5   0.1        Ulnar Motor (Abd Dig Min)  30.4C  Wrist  4.2   9.1  B Elbow Wrist  *41   B Elbow  8.9   9.5  A Elbow B Elbow  56   A Elbow  10.7   8.9           Waveforms:

## 2019-06-07 NOTE — Progress Notes (Signed)
Alexander [name] - 74 y.o. male MRN [id number]  Date of birth: 1945-05-27  Office Visit Note: Visit Date: 06/04/2019 PCP: Rutherford Guys, MD Referred by: Rutherford Guys, MD  Subjective: Chief Complaint  Patient presents with  . Right Hand - Numbness, Pain, Tingling   HPI: Alexander [name] is a 75 y.o. male who comes in today At the request of Dr. Monna Fam for electrodiagnostic study of the right upper limb.  Patient is right-hand dominant with history of several months of worsening but several years of off-and-on symptoms in the right hand particularly middle finger and first digit.  She gets a lot of numbness and tingling and some pain.  He reports significant worsening 3 months ago.  He does a lot of work using his hands and this makes it worse.  He has difficulty with small objects and buttoning.  He has difficulty with driving and using the hand.  No real left-sided complaints.  No radicular complaints.  Rates his pain as 10 out of 10.  He does have a history of type 2 diabetes with hemoglobin A1c listed below over the last being 7.4.  No prior electrodiagnostic study to review.  ROS Otherwise per HPI.  Assessment & Plan: Visit Diagnoses:  1. Paresthesia of skin     Plan: Impression: The above electrodiagnostic study is ABNORMAL and reveals evidence of:  1.  A severe right median nerve entrapment at the wrist (carpal tunnel syndrome) affecting sensory and motor components. The lesion is characterized by sensory and motor demyelination with evidence of significant axonal injury.  Despite appropriate decompression there would likely be some residual nerve damage.  2.  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   There is no significant electrodiagnostic evidence of any other focal nerve entrapment or brachial plexopathy or cervical radiculopathy.   Recommendations: 1.  Follow-up  with referring physician. 2.  Continue current management of symptoms. 3.  Suggest surgical evaluation.  Meds & Orders: No orders of the defined types were placed in this encounter.   Orders Placed This Encounter  Procedures  . NCV with EMG (electromyography)    Follow-up: Return for  [name], M.D..   Procedures: No procedures performed  EMG & NCV Findings: Evaluation of the right median motor nerve showed prolonged distal onset latency (16.7 ms), reduced amplitude (0.1 mV), and decreased conduction velocity (Elbow-Wrist, 21 m/s).  The right ulnar motor nerve showed decreased conduction velocity (B Elbow-Wrist, 41 m/s).  The right median (across palm) sensory nerve showed prolonged distal peak latency (Wrist, 7.8 ms), reduced amplitude (3.0 V), and prolonged distal peak latency (Palm, 6.1 ms).  The right radial sensory nerve showed prolonged distal peak latency (4.5 ms).  The right ulnar sensory nerve showed prolonged distal peak latency (5.0 ms) and decreased conduction velocity (Wrist-5th Digit, 28 m/s).    All examined muscles (as indicated in the following table) showed no evidence of electrical instability.    Impression: The above electrodiagnostic study is ABNORMAL and reveals evidence of:  1.  A severe right median nerve entrapment at the wrist (carpal tunnel syndrome) affecting sensory and motor components. The lesion is characterized by sensory and motor demyelination with evidence of significant axonal injury.  Despite appropriate decompression there would likely be some residual nerve damage.  2.  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  There is no significant electrodiagnostic evidence of any other focal nerve entrapment or brachial plexopathy or cervical radiculopathy.   Recommendations: 1.  Follow-up with referring physician. 2.  Continue current management of  symptoms. 3.  Suggest surgical evaluation.  ___________________________ Laurence Spates FAAPMR Board Certified, American Board of Physical Medicine and Rehabilitation    Nerve Conduction Studies Anti Sensory Summary Table   Stim Site NR Peak (ms) Norm Peak (ms) P-T Amp (V) Norm P-T Amp Site1 Site2 Delta-P (ms) Dist (cm) Vel (m/s) Norm Vel (m/s)  Right Median Acr Palm Anti Sensory (2nd Digit)  30.5C  Wrist    *7.8 <3.6 *3.0 >10 Wrist Palm 1.7 0.0    Palm    *6.1 <2.0 2.3         Right Radial Anti Sensory (Base 1st Digit)  30.5C  Wrist    *4.5 <3.1 5.1  Wrist Base 1st Digit 4.5 0.0    Right Ulnar Anti Sensory (5th Digit)  30.8C  Wrist    *5.0 <3.7 17.8 >15.0 Wrist 5th Digit 5.0 14.0 *28 >38   Motor Summary Table   Stim Site NR Onset (ms) Norm Onset (ms) O-P Amp (mV) Norm O-P Amp Site1 Site2 Delta-0 (ms) Dist (cm) Vel (m/s) Norm Vel (m/s)  Right Median Motor (Abd Poll Brev)  30.6C  Wrist    *16.7 <4.2 *0.1 >5 Elbow Wrist 9.8 20.3 *21 >50  Elbow    26.5  0.1         Right Ulnar Motor (Abd Dig Min)  30.4C  Wrist    4.2 <4.2 9.1 >3 B Elbow Wrist 4.7 19.5 *41 >53  B Elbow    8.9  9.5  A Elbow B Elbow 1.8 10.0 56 >53  A Elbow    10.7  8.9          EMG   Side Muscle Nerve Root Ins Act Fibs Psw Amp Dur Poly Recrt Int Fraser Din Comment  Right Abd Poll Brev Median C8-T1 Dcr 4+ 4+ Rdc ---- 0 Rdc ---- Rare MUAP  Right 1stDorInt Ulnar C8-T1 Nml Nml Nml Nml Nml 0 Nml Nml   Right PronatorTeres Median C6-7 Nml Nml Nml Nml Nml 0 Nml Nml   Right Biceps Musculocut C5-6 Nml Nml Nml Nml Nml 0 Nml Nml   Right Deltoid Axillary C5-6 Nml Nml Nml Nml Nml 0 Nml Nml     Nerve Conduction Studies Anti Sensory Left/Right Comparison   Stim Site L Lat (ms) R Lat (ms) L-R Lat (ms) L Amp (V) R Amp (V) L-R Amp (%) Site1 Site2 L Vel (m/s) R Vel (m/s) L-R Vel (m/s)  Median Acr Palm Anti Sensory (2nd Digit)  30.5C  Wrist  *7.8   *3.0  Wrist Palm     Palm  *6.1   2.3        Radial Anti Sensory (Base 1st Digit)   30.5C  Wrist  *4.5   5.1  Wrist Base 1st Digit     Ulnar Anti Sensory (5th Digit)  30.8C  Wrist  *5.0   17.8  Wrist 5th Digit  *28    Motor Left/Right Comparison   Stim Site L Lat (ms) R Lat (ms) L-R Lat (ms) L Amp (mV) R Amp (mV) L-R Amp (%) Site1 Site2 L Vel (m/s) R Vel (m/s) L-R Vel (m/s)  Median Motor (Abd Poll Brev)  30.6C  Wrist  *16.7   *0.1  Elbow Wrist  *21   Elbow  26.5   0.1  Ulnar Motor (Abd Dig Min)  30.4C  Wrist  4.2   9.1  B Elbow Wrist  *41   B Elbow  8.9   9.5  A Elbow B Elbow  56   A Elbow  10.7   8.9           Waveforms:             Clinical History: No specialty comments available.   He reports that he has been smoking cigarettes. He has a 55.00 pack-year smoking history. He has never used smokeless tobacco.  Recent Labs    07/31/18 1622 11/02/18 0851 04/27/19 1045  HGBA1C 7.4* 7.2* 6.4*    Objective:  VS:  HT:    WT:   BMI:     BP:   HR: bpm  TEMP: ( )  RESP:  Physical Exam Musculoskeletal:        General: No tenderness.     Comments: Inspection reveals flattening of the right APB but no atrophy of the left APB or bilateral FDI or hand intrinsics. There is no swelling, color changes, allodynia or dystrophic changes. There is 5 out of 5 strength in the bilateral wrist extension, finger abduction and long finger flexion. There is decreased sensation to light touch in right median nerve distribution.  There is a negative Hoffmann's test bilaterally.  Skin:    General: Skin is warm and dry.     Findings: No erythema or rash.  Neurological:     General: No focal deficit present.     Mental Status: He is alert and oriented to person, place, and time.     Sensory: No sensory deficit.     Motor: No weakness or abnormal muscle tone.     Coordination: Coordination normal.     Gait: Gait normal.  Psychiatric:        Mood and Affect: Mood normal.        Behavior: Behavior normal.        Thought Content: Thought content normal.      Ortho Exam  Imaging: No results found.  Past Medical/Family/Surgical/Social History: Medications & Allergies reviewed per EMR, new medications updated. Patient Active Problem List   Diagnosis Date Noted  . Malignant neoplasm of prostate ([place]) 10/27/2018  . Allergic reaction 08/10/2018  . Angioedema 08/10/2018  . Peripheral arterial occlusive disease ([place]) 10/28/2016  . Hyperlipidemia LDL goal <70 05/01/2015  . Tobacco use disorder 12/09/2013  . 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 Erectile dysfunction due to diseases classified elsewhere 12/09/2013  . Type 2 diabetes mellitus with other circulatory complications ([place]) [id number]  . Left lumbar radiculopathy 09/30/2012  . Chronic rhinitis 04/08/2011  . Hypertension   . HTN (hypertension), benign 03/09/2011  . History of prostate cancer 03/09/2011   Past Medical History:  Diagnosis Date  . Angio-edema   . Diabetes mellitus without complication ([place])   . Hypertension   . Neuromuscular disorder ([place])   . Peripheral arterial disease ([place]) 11/14/2016   non-occlusive, asymptomatic, seen by vascular surgery Dr. Oneida Alar who rec repeated ABIs annually  . Prostate cancer [facility])    Family History  Problem Relation Age of Onset  . Diabetes Mother   . Depression Father   . Hyperlipidemia Sister   . Breast cancer Neg Hx   . Prostate cancer Neg Hx   . Colon cancer Neg Hx    Past Surgical History:  Procedure Laterality Date  .  COLON SURGERY     "locked bowel" fixed  . HERNIA REPAIR    . LEFT HEART CATHETERIZATION WITH CORONARY ANGIOGRAM N/A 09/15/2013   Procedure: LEFT HEART CATHETERIZATION WITH CORONARY ANGIOGRAM;  Surgeon: Peter M Martinique, MD;  Location: [facility] CATH LAB;  Service: Cardiovascular;  Laterality: N/A;  . PROSTATE SURGERY  2005   Social History   Occupational History  . Occupation: retired  Tobacco Use  . Smoking status: Current Every Day Smoker     Packs/day: 1.00    Years: 55.00    Pack years: 55.00    Types: Cigarettes  . Smokeless tobacco: Never Used  Substance and Sexual Activity  . Alcohol use: No  . Drug use: No  . Sexual activity: Yes

## 2019-06-08 ENCOUNTER — Other Ambulatory Visit: Payer: Self-pay

## 2019-06-08 ENCOUNTER — Encounter: Payer: Self-pay | Admitting: Orthopaedic Surgery

## 2019-06-08 ENCOUNTER — Ambulatory Visit: Payer: Medicare Other | Admitting: Orthopaedic Surgery

## 2019-06-08 DIAGNOSIS — G5601 Carpal tunnel syndrome, right upper limb: Secondary | ICD-10-CM | POA: Diagnosis not present

## 2019-06-08 MED ORDER — LIDOCAINE HCL 1 % IJ SOLN
1.0000 mL | INTRAMUSCULAR | Status: AC | PRN
  Administered 2019-06-08: 1 mL

## 2019-06-08 MED ORDER — BUPIVACAINE HCL 0.5 % IJ SOLN
1.0000 mL | INTRAMUSCULAR | Status: AC | PRN
  Administered 2019-06-08: 1 mL

## 2019-06-08 MED ORDER — METHYLPREDNISOLONE ACETATE 40 MG/ML IJ SUSP
40.0000 mg | INTRAMUSCULAR | Status: AC | PRN
  Administered 2019-06-08: 40 mg

## 2019-06-08 NOTE — Progress Notes (Signed)
Office Visit Note   Patient: Alexander [name]           Date of Birth: 03-30-1945           MRN: [id number] Visit Date: 06/08/2019              Requested by: Alexander Guys, MD [address] [place],  [place] [address] PCP: Alexander Guys, MD   Assessment & Plan: Visit Diagnoses:  1. 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  I have recommended surgical release in the near future.  He would like to try cortisone injection first.  He understands that this is just for symptomatic relief.  He also understands that given the severity of the carpal tunnel syndrome that he may not get complete relief from surgical release.  He tolerated the injection well today.  We will see him back as needed.  Follow-Up Instructions: No follow-ups on file.   Orders:  No orders of the defined types were placed in this encounter.  No orders of the defined types were placed in this encounter.     Procedures: Hand/UE Inj: R carpal tunnel for carpal tunnel syndrome on 06/08/2019 8:39 AM Indications: pain Details: 25 G needle Medications: 1 mL lidocaine 1 %; 1 mL bupivacaine 0.5 %; 40 mg methylPREDNISolone acetate 40 MG/ML Outcome: tolerated well, no immediate complications Patient was prepped and draped in the usual sterile fashion.       Clinical Data: No additional findings.   Subjective: Chief Complaint  Patient presents with  . Right Hand - Pain    [name] is here today for nerve conduction study review.  He continues to have difficulty with right hand function.   Review of Systems  Constitutional: Negative.   All other systems reviewed and are negative.    Objective: Vital Signs: There were no vitals taken for this visit.  Physical Exam Vitals and nursing note reviewed.  Constitutional:      Appearance: He is well-developed.  Pulmonary:     Effort: Pulmonary effort is normal.    Abdominal:     Palpations: Abdomen is soft.  Skin:    General: Skin is warm.  Neurological:     Mental Status: He is alert and oriented to person, place, and time.  Psychiatric:        Behavior: Behavior normal.        Thought Content: Thought content normal.        Judgment: Judgment normal.     Ortho Exam Right hand shows thenar atrophy.  No changes otherwise. Specialty Comments:  No specialty comments available.  Imaging: No results found.   PMFS History: Patient Active Problem List   Diagnosis Date Noted  . Right carpal tunnel syndrome 06/08/2019  . Malignant neoplasm of prostate ([place]) 10/27/2018  . Allergic reaction 08/10/2018  . Angioedema 08/10/2018  . Peripheral arterial occlusive disease ([place]) 10/28/2016  . Hyperlipidemia LDL goal <70 05/01/2015  . Tobacco use disorder 12/09/2013  . 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 Erectile dysfunction due to diseases classified elsewhere 12/09/2013  . Type 2 diabetes mellitus with other circulatory complications ([place]) [id number]  . Left lumbar radiculopathy 09/30/2012  . Chronic rhinitis 04/08/2011  . Hypertension   . HTN (hypertension), benign 03/09/2011  . History of prostate cancer 03/09/2011   Past Medical History:  Diagnosis Date  .  Angio-edema   . Diabetes mellitus without complication ([place])   . Hypertension   . Neuromuscular disorder ([place])   . Peripheral arterial disease ([place]) 11/14/2016   non-occlusive, asymptomatic, seen by vascular surgery Dr. Oneida Alar who rec repeated ABIs annually  . Prostate cancer [facility])     Family History  Problem Relation Age of Onset  . Diabetes Mother   . Depression Father   . Hyperlipidemia Sister   . Breast cancer Neg Hx   . Prostate cancer Neg Hx   . Colon cancer Neg Hx     Past Surgical History:  Procedure Laterality Date  . COLON SURGERY     "locked bowel" fixed  . HERNIA REPAIR    . LEFT HEART  CATHETERIZATION WITH CORONARY ANGIOGRAM N/A 09/15/2013   Procedure: LEFT HEART CATHETERIZATION WITH CORONARY ANGIOGRAM;  Surgeon: Peter M Martinique, MD;  Location[facility] CATH LAB;  Service: Cardiovascular;  Laterality: N/A;  . PROSTATE SURGERY  2005   Social History   Occupational History  . Occupation: retired  Tobacco Use  . Smoking status: Current Every Day Smoker    Packs/day: 1.00    Years: 55.00    Pack years: 55.00    Types: Cigarettes  . Smokeless tobacco: Never Used  Substance and Sexual Activity  . Alcohol use: No  . Drug use: No  . Sexual activity: Yes

## 2019-06-28 ENCOUNTER — Other Ambulatory Visit: Payer: Self-pay | Admitting: Family Medicine

## 2019-06-28 NOTE — Telephone Encounter (Signed)
Requested medication (s) are due for refill today - discontinued  Requested medication (s) are on the active medication list - no  Future visit scheduled -yes  Last refill: 03/18/19  Notes to clinic: Request for non delegated Rx- discontinued 4/6  Requested Prescriptions  Pending Prescriptions Disp Refills   tiZANidine (ZANAFLEX) 4 MG tablet [Pharmacy Med Name: TIZANIDINE  4MG   TAB] 30 tablet 0    Sig: TAKE 1 TABLET BY MOUTH  EVERY 8 HOURS AS NEEDED FOR MUSCLE SPASMS IN  BACK/LEGS/FEET.      Not Delegated - Cardiovascular:  Alpha-2 Agonists - tizanidine Failed - 06/28/2019 12:51 PM      Failed - This refill cannot be delegated      Passed - Valid encounter within last 6 months    Recent Outpatient Visits           2 months ago Essential hypertension   [facility], Lilia Argue, MD   3 months ago Paresthesias in right hand   [facility], Lilia Argue, MD   5 months ago Right ear impacted cerumen   [facility], Lilia Argue, MD   7 months ago Medicare annual wellness visit, subsequent   Primary Care at [facility], [place] A, MD   7 months ago Type 2 diabetes mellitus with other circulatory complications [facility])   [facility], Lilia Argue, MD       Future Appointments             In 4 months Rutherford Guys, MD Primary Care at [place], [facility]                Requested Prescriptions  Pending Prescriptions Disp Refills   tiZANidine (ZANAFLEX) 4 MG tablet [Pharmacy Med Name: TIZANIDINE  4MG   TAB] 30 tablet 0    Sig: TAKE 1 TABLET BY MOUTH  EVERY 8 HOURS AS NEEDED FOR MUSCLE SPASMS IN  BACK/LEGS/FEET.      Not Delegated - Cardiovascular:  Alpha-2 Agonists - tizanidine Failed - 06/28/2019 12:51 PM      Failed - This refill cannot be delegated      Passed - Valid encounter within last 6 months    Recent Outpatient Visits           2 months ago Essential hypertension   [facility], Lilia Argue, MD   3  months ago Paresthesias in right hand   [facility], Lilia Argue, MD   5 months ago Right ear impacted cerumen   [facility], Lilia Argue, MD   7 months ago Medicare annual wellness visit, subsequent   Primary Care at [facility], [place] A, MD   7 months ago Type 2 diabetes mellitus with other circulatory complications [facility])   [facility], Lilia Argue, MD       Future Appointments             In 4 months Rutherford Guys, MD Primary Care at [place], [facility]

## 2019-06-28 NOTE — Telephone Encounter (Signed)
Patient is requesting a refill of the following medications: Requested Prescriptions   Pending Prescriptions Disp Refills   tiZANidine (ZANAFLEX) 4 MG tablet [Pharmacy Med Name: TIZANIDINE  4MG   TAB] 30 tablet 0    Sig: TAKE 1 TABLET BY MOUTH  EVERY 8 HOURS AS NEEDED FOR MUSCLE SPASMS IN  BACK/LEGS/FEET.    Date of patient request: 06/28/2019 Last office visit: 04/27/2019 Date of last refill: 03/18/2019 Last refill amount: 30 tabs Follow up time period per chart: 6 months

## 2019-06-30 ENCOUNTER — Telehealth: Payer: Self-pay | Admitting: Family Medicine

## 2019-06-30 DIAGNOSIS — M5416 Radiculopathy, lumbar region: Secondary | ICD-10-CM

## 2019-06-30 NOTE — Telephone Encounter (Signed)
Patient is requesting a refill of the following medications: Requested Prescriptions    No prescriptions requested or ordered in this encounter  Hydro/ Apap 10-325 mg   Date of patient request: 06/30/2019 Last office visit: 04/27/2019 Date of last refill: 04/27/2019 Last refill amount: 20 tabs Follow up time period per chart: 10/26/2019

## 2019-06-30 NOTE — Telephone Encounter (Signed)
Medication Refill - Medication:  HYDROcodone-acetaminophen (NORCO) 10-325 MG tablet [[id number]]    Preferred Pharmacy (with phone number or street name):  [place], [place] [address]  Phone: [phone] Fax: [phone]  Not a 24 hour pharmacy; exact hours not known.     Agent: Please be advised that RX refills may take up to 3 business days. We ask that you follow-up with your pharmacy.

## 2019-07-01 MED ORDER — HYDROCODONE-ACETAMINOPHEN 10-325 MG PO TABS: ORAL_TABLET | ORAL | 0 refills | Status: DC

## 2019-07-01 NOTE — Telephone Encounter (Signed)
pmp reviewed  Med refilled 

## 2019-07-06 ENCOUNTER — Telehealth: Payer: Self-pay

## 2019-07-06 NOTE — Telephone Encounter (Signed)
Called and left a message for him to return the call, I need to know which medication he wants the 90 days supply. I suspect they are referring to the Hydrocodone with Apap but this cannot be done in 90 day supplies or through a mail service they must be 30 days and it must go to a local pharmacy.

## 2019-07-06 NOTE — Telephone Encounter (Signed)
Copied from [place] [id number]. Topic: General - Inquiry >> Jun 30, 2019 12:24 PM Alease Frame wrote: Reason for CRM: Pts daughter called in wanting  rx refill. She wasn't sure if it was a 90 day supply . If so wanted to send to [place], [address], [address] [place], [place] [address] Phone: [phone] Fax: [phone]  Please advise

## 2019-07-13 ENCOUNTER — Other Ambulatory Visit: Payer: Self-pay | Admitting: Family Medicine

## 2019-07-13 NOTE — Telephone Encounter (Signed)
Copied from [place] [id number]. Topic: Quick Communication - Rx Refill/Question >> Jul 13, 2019 11:17 AM Leward Quan A wrote: Medication: tiZANidine (ZANAFLEX) 4 MG tablet    Has the patient contacted their pharmacy? Yes.   (Agent: If no, request that the patient contact the pharmacy for the refill.) (Agent: If yes, when and what did the pharmacy advise?)  Preferred Pharmacy (with phone number or street name): [place], [place], [address]  Phone:  [phone] Fax:  [phone]     Agent: Please be advised that RX refills may take up to 3 business days. We ask that you follow-up with your pharmacy.

## 2019-07-13 NOTE — Telephone Encounter (Signed)
Requested medication (s) are due for refill today: Yes  Requested medication (s) are on the active medication list: Yes  Last refill:  06/28/19  Future visit scheduled: Yes  Notes to clinic:  See request.    Requested Prescriptions  Pending Prescriptions Disp Refills   tiZANidine (ZANAFLEX) 4 MG tablet 30 tablet 0      Not Delegated - Cardiovascular:  Alpha-2 Agonists - tizanidine Failed - 07/13/2019 11:22 AM      Failed - This refill cannot be delegated      Passed - Valid encounter within last 6 months    Recent Outpatient Visits           2 months ago Essential hypertension   [facility], Lilia Argue, MD   3 months ago Paresthesias in right hand   [facility], Lilia Argue, MD   6 months ago Right ear impacted cerumen   [facility], Lilia Argue, MD   8 months ago Medicare annual wellness visit, subsequent   Primary Care at [facility], [place] A, MD   8 months ago Type 2 diabetes mellitus with other circulatory complications [facility])   [facility], Lilia Argue, MD       Future Appointments             In 3 months Rutherford Guys, MD Primary Care at [place], [facility]

## 2019-07-30 IMAGING — CT CT CHEST LUNG CANCER SCREENING LOW DOSE W/O CM
1 of 3 series · 10 of 30 positions shown, 13 images · non-contrast
Comparison: 03/13/2016 chest radiograph.

CLINICAL DATA: 72-year-old asymptomatic male current smoker with 55
pack-year smoking history.

EXAM:
CT CHEST WITHOUT CONTRAST LOW-DOSE FOR LUNG CANCER SCREENING
TECHNIQUE: Multidetector CT imaging of the chest was performed following the
standard protocol without IV contrast.

[ct lung segmentation data · axial · 0.69mm/px · z∈[+1206,+1206]mm · 10 of 314 frames shown]
[frame 1/314  mediastinal]
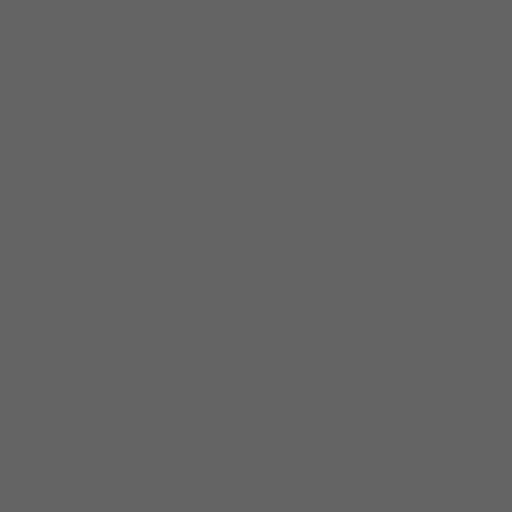
[frame 1/314  lung]
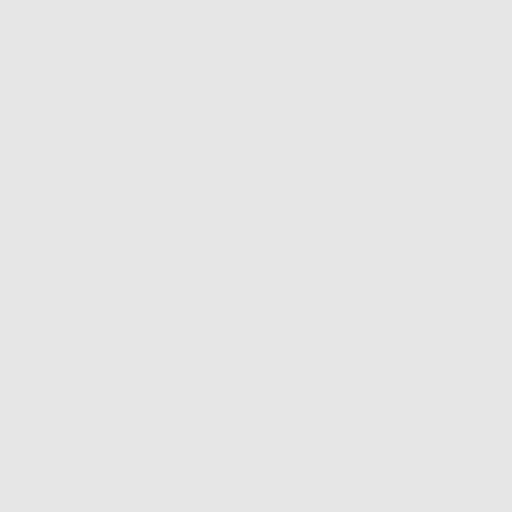
[frame 35/314  lung]
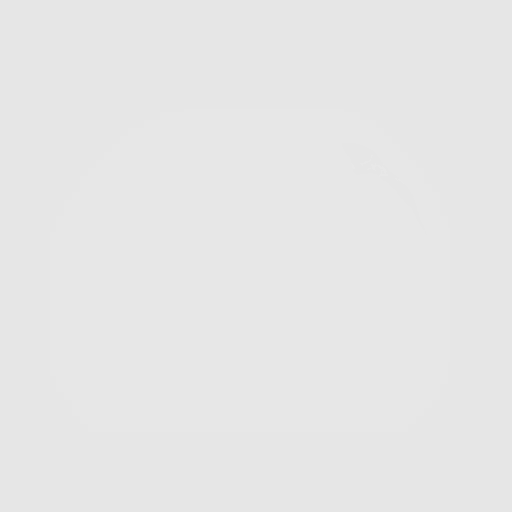
[frame 70/314  lung]
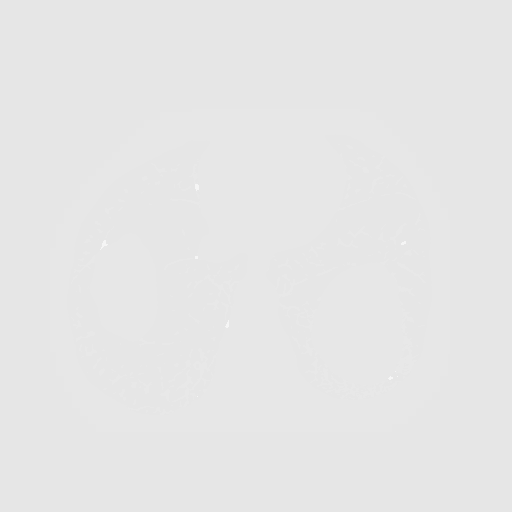
[frame 105/314  lung]
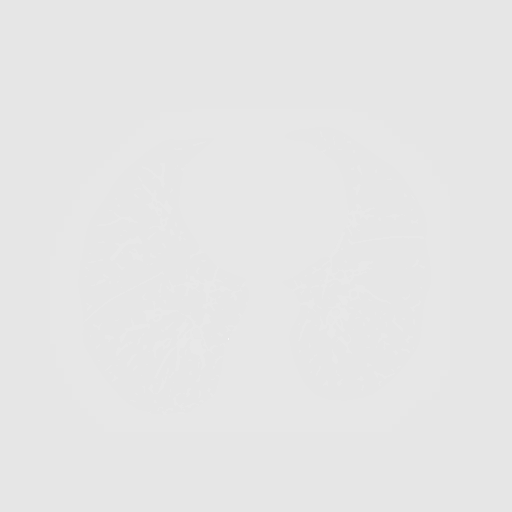
[frame 140/314  mediastinal]
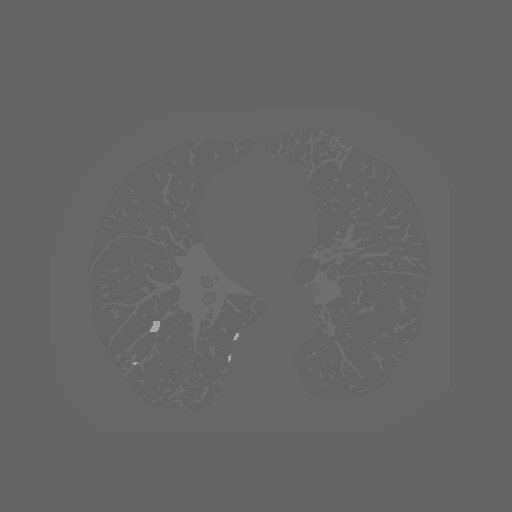
[frame 140/314  lung]
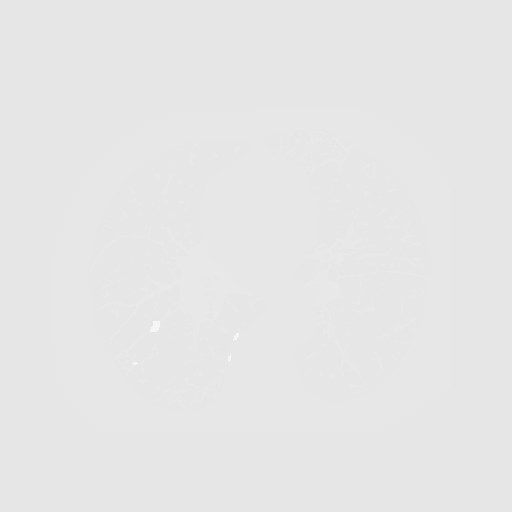
[frame 174/314  lung]
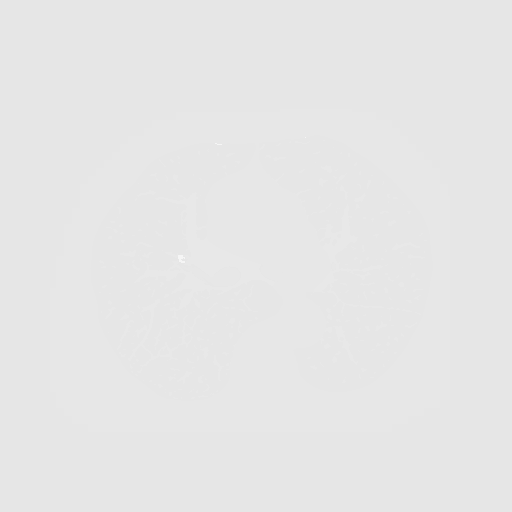
[frame 209/314  lung]
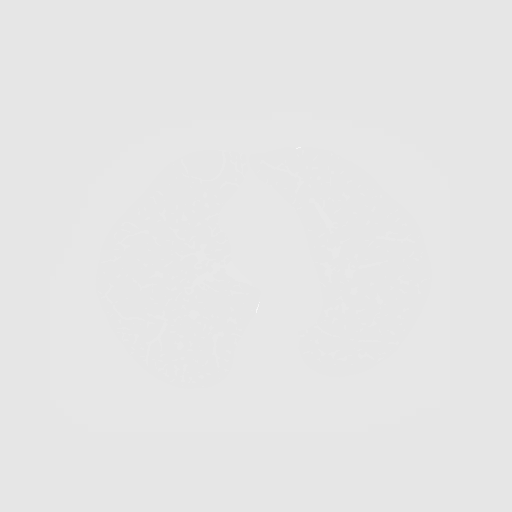
[frame 244/314  lung]
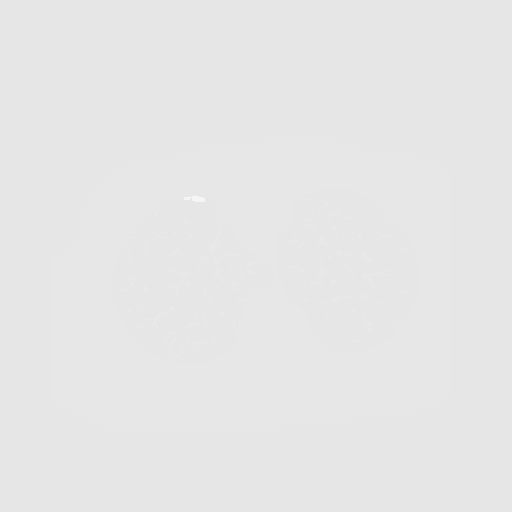
[frame 279/314  mediastinal]
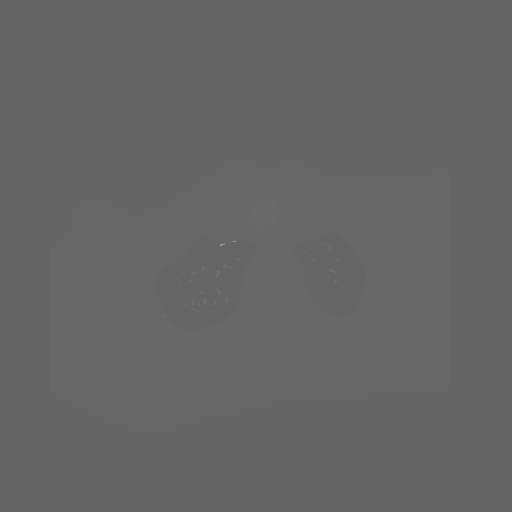
[frame 279/314  lung]
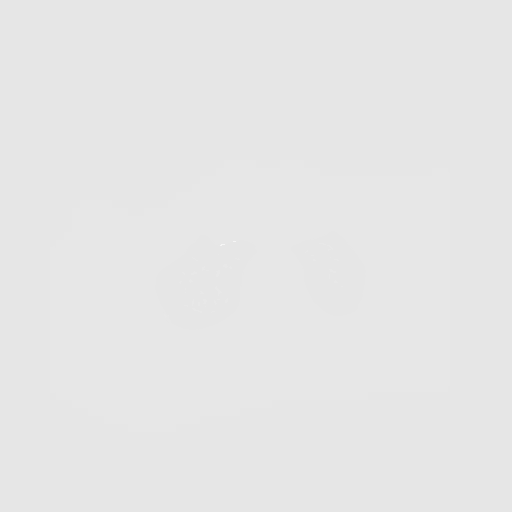
[frame 314/314  lung]
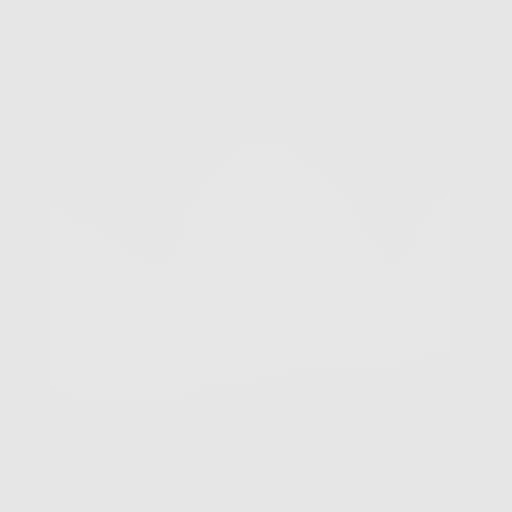

[10 of 30 positions shown; findings below may reference images not displayed]

FINDINGS: Cardiovascular: Normal heart size. No significant pericardial
fluid/thickening. Left anterior descending and right coronary
atherosclerosis. Atherosclerotic nonaneurysmal thoracic aorta.
Normal caliber pulmonary arteries.

Mediastinum/Nodes: No discrete thyroid nodules. Unremarkable
esophagus. No pathologically enlarged axillary, mediastinal or gross
hilar lymph nodes, noting limited sensitivity for the detection of
hilar adenopathy on this noncontrast study.

Lungs/Pleura: No pneumothorax. No pleural effusion. Marked
centrilobular emphysema with mild diffuse bronchial wall thickening.
No acute consolidative airspace disease or lung masses. There are a
few solid pulmonary nodules scattered throughout both lungs, largest
5.9 mm in volume derived mean diameter in the subpleural anterior
right lower lobe (series 3/image 175).

Upper abdomen: No acute abnormality.

Musculoskeletal: No aggressive appearing focal osseous lesions.
Symmetric mild gynecomastia. Mild thoracic spondylosis.
IMPRESSION: 1. Lung-RADS 2, benign appearance or behavior. Continue annual
screening with low-dose chest CT without contrast in 12 months.
2. Two vessel coronary atherosclerosis.

Aortic Atherosclerosis (OL571-VRH.H) and Emphysema (OL571-ZLW.R).

## 2019-08-07 ENCOUNTER — Other Ambulatory Visit: Payer: Self-pay

## 2019-08-07 ENCOUNTER — Encounter (HOSPITAL_COMMUNITY): Payer: Self-pay | Admitting: Emergency Medicine

## 2019-08-07 ENCOUNTER — Emergency Department (HOSPITAL_COMMUNITY)
Admission: EM | Admit: 2019-08-07 | Discharge: 2019-08-08 | Disposition: A | Discharge status: Home / Self Care | Payer: Medicare Other | Attending: Emergency Medicine | Admitting: Emergency Medicine

## 2019-08-07 DIAGNOSIS — I2510 Atherosclerotic heart disease of native coronary artery without angina pectoris: Secondary | ICD-10-CM | POA: Insufficient documentation

## 2019-08-07 DIAGNOSIS — E1159 Type 2 diabetes mellitus with other circulatory complications: Secondary | ICD-10-CM | POA: Diagnosis not present

## 2019-08-07 DIAGNOSIS — Z8546 Personal history of malignant neoplasm of prostate: Secondary | ICD-10-CM | POA: Insufficient documentation

## 2019-08-07 DIAGNOSIS — Z7984 Long term (current) use of oral hypoglycemic drugs: Secondary | ICD-10-CM | POA: Diagnosis not present

## 2019-08-07 DIAGNOSIS — F17210 Nicotine dependence, cigarettes, uncomplicated: Secondary | ICD-10-CM | POA: Diagnosis not present

## 2019-08-07 DIAGNOSIS — Z79899 Other long term (current) drug therapy: Secondary | ICD-10-CM | POA: Diagnosis not present

## 2019-08-07 DIAGNOSIS — I10 Essential (primary) hypertension: Secondary | ICD-10-CM | POA: Insufficient documentation

## 2019-08-07 DIAGNOSIS — Z7982 Long term (current) use of aspirin: Secondary | ICD-10-CM | POA: Insufficient documentation

## 2019-08-07 DIAGNOSIS — R220 Localized swelling, mass and lump, head: Secondary | ICD-10-CM | POA: Diagnosis present

## 2019-08-07 DIAGNOSIS — E1151 Type 2 diabetes mellitus with diabetic peripheral angiopathy without gangrene: Secondary | ICD-10-CM | POA: Diagnosis not present

## 2019-08-07 DIAGNOSIS — T783XXD Angioneurotic edema, subsequent encounter: Secondary | ICD-10-CM | POA: Diagnosis not present

## 2019-08-07 MED ORDER — FAMOTIDINE IN NACL 20-0.9 MG/50ML-% IV SOLN
20.0000 mg | Freq: Once | INTRAVENOUS | Status: AC
  Administered 2019-08-07: 20 mg via INTRAVENOUS
  Filled 2019-08-07: qty 50

## 2019-08-07 MED ORDER — METHYLPREDNISOLONE SODIUM SUCC 125 MG IJ SOLR
125.0000 mg | Freq: Once | INTRAMUSCULAR | Status: AC
  Administered 2019-08-07: 125 mg via INTRAVENOUS
  Filled 2019-08-07: qty 2

## 2019-08-07 MED ORDER — DIPHENHYDRAMINE HCL 50 MG/ML IJ SOLN
50.0000 mg | Freq: Once | INTRAMUSCULAR | Status: AC
  Administered 2019-08-07: 50 mg via INTRAVENOUS
  Filled 2019-08-07: qty 1

## 2019-08-07 NOTE — ED Triage Notes (Signed)
Pt reports tongue swelling that started a day ago. Pt reports difficulty swallowing.

## 2019-08-08 MED ORDER — PREDNISONE 10 MG PO TABS
20.0000 mg | ORAL_TABLET | Freq: Every day | ORAL | 0 refills | Status: AC
Start: 2019-08-08 — End: 2019-08-11

## 2019-08-09 NOTE — ED Provider Notes (Signed)
[facility] DEPT Provider Note   CSN: [id number] Arrival date & time: 08/07/19  2015     History Chief Complaint  Patient presents with  . Oral Swelling    Alexander [name] is a 74 y.o. male.  HPI     74 year old male with history of diabetes, hypertension, neuromuscular disorder, peripheral artery disease, prostate cancer, history of recurrent idiopathic angioedema, presents with concern for tongue swelling.  Reports tongue swelling similar to what he has had in the past.  Reports in the past emergency department visits he has received medications and gone home.  Previously thought it may have been lisinopril however had recurrent episodes, saw allergist, no known cause.  Reports feels a little improved since it began.  Mild difficulty swallowing, change in voice. No drooling no dyspnea.  Denies rash, nausea, vomiting, lightheadedness, dyspnea   Past Medical History:  Diagnosis Date  . Angio-edema   . Diabetes mellitus without complication ([place])   . Hypertension   . Neuromuscular disorder ([place])   . Peripheral arterial disease ([place]) 11/14/2016   non-occlusive, asymptomatic, seen by vascular surgery Dr. Oneida Alar who rec repeated ABIs annually  . Prostate cancer [facility])     Patient Active Problem List   Diagnosis Date Noted  . Right carpal tunnel syndrome 06/08/2019  . Malignant neoplasm of prostate ([place]) 10/27/2018  . Allergic reaction 08/10/2018  . Angioedema 08/10/2018  . Peripheral arterial occlusive disease ([place]) 10/28/2016  . Hyperlipidemia LDL goal <70 05/01/2015  . Tobacco use disorder 12/09/2013  . 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 Erectile dysfunction due to diseases classified elsewhere 12/09/2013  . Type 2 diabetes mellitus with other circulatory complications ([place]) 81/85/6314  . Left lumbar radiculopathy 09/30/2012  . Chronic rhinitis 04/08/2011  .  Hypertension   . HTN (hypertension), benign 03/09/2011  . History of prostate cancer 03/09/2011    Past Surgical History:  Procedure Laterality Date  . COLON SURGERY     "locked bowel" fixed  . HERNIA REPAIR    . LEFT HEART CATHETERIZATION WITH CORONARY ANGIOGRAM N/A 09/15/2013   Procedure: LEFT HEART CATHETERIZATION WITH CORONARY ANGIOGRAM;  Surgeon: Peter M Martinique, MD;  Location: [facility] CATH LAB;  Service: Cardiovascular;  Laterality: N/A;  . PROSTATE SURGERY  2005       Family History  Problem Relation Age of Onset  . Diabetes Mother   . Depression Father   . Hyperlipidemia Sister   . Breast cancer Neg Hx   . Prostate cancer Neg Hx   . Colon cancer Neg Hx     Social History   Tobacco Use  . Smoking status: Current Every Day Smoker    Packs/day: 1.00    Years: 55.00    Pack years: 55.00    Types: Cigarettes  . Smokeless tobacco: Never Used  Vaping Use  . Vaping Use: Never used  Substance Use Topics  . Alcohol use: No  . Drug use: No    Home Medications Prior to Admission medications   Medication Sig Start Date End Date Taking? Authorizing Provider  amLODipine (NORVASC) 10 MG tablet Take 1 tablet (10 mg total) by mouth daily. 04/27/19  Yes Rutherford Guys, MD  aspirin EC 81 MG tablet Take 81 mg by mouth daily as needed for mild pain.    Yes [provider]  Aspirin-Salicylamide-Caffeine (BC HEADACHE POWDER PO) Take 1 Package by mouth daily as needed (for back pain).  Yes [provider]  atorvastatin (LIPITOR) 40 MG tablet Take 1 tablet (40 mg total) by mouth daily. 04/27/19  Yes Rutherford Guys, MD  fluticasone [facility]) 50 MCG/ACT nasal spray Place 2 sprays into both nostrils daily. 03/18/19  Yes Rutherford Guys, MD  gabapentin (NEURONTIN) 300 MG capsule TAKE 3 CAPSULES BY MOUTH  TWO TIMES DAILY Patient taking differently: Take 900 mg by mouth 2 (two) times daily.  04/27/19  Yes Rutherford Guys, MD  hydrochlorothiazide (HYDRODIURIL) 25 MG tablet Take  1 tablet (25 mg total) by mouth daily. 04/27/19  Yes Rutherford Guys, MD  HYDROcodone-acetaminophen (NORCO) 10-325 MG tablet TAKE 1 TABLET BY MOUTH EVERY 6 (SIX) HOURS AS NEEDED FOR MODERATE PAIN. Patient taking differently: Take 0.5 tablets by mouth daily as needed for moderate pain.  07/01/19  Yes Rutherford Guys, MD  meloxicam (MOBIC) 7.5 MG tablet TAKE 1 TABLET BY MOUTH  DAILY AS NEEDED FOR PAIN Patient taking differently: Take 7.5 mg by mouth daily as needed for pain.  05/19/19  Yes Rutherford Guys, MD  metFORMIN (GLUCOPHAGE) 1000 MG tablet Take 1 tablet (1,000 mg total) by mouth 2 (two) times daily with a meal. 04/27/19  Yes Rutherford Guys, MD  Multiple Vitamin (MULTIVITAMIN WITH MINERALS) TABS tablet Take 1 tablet by mouth daily.   Yes [provider]  sildenafil (VIAGRA) 100 MG tablet Take 100 mg by mouth as needed for erectile dysfunction.    Yes [provider]  tiZANidine (ZANAFLEX) 4 MG tablet TAKE 1 TABLET BY MOUTH  EVERY 8 HOURS AS NEEDED FOR MUSCLE SPASMS IN  BACK/LEGS/FEET Patient taking differently: Take 4 mg by mouth daily as needed for muscle spasms.  06/28/19  Yes Rutherford Guys, MD  triamcinolone cream (KENALOG) 0.1 % APPLY TOPICALLY TWO TIMES A DAY Patient taking differently: Apply 1 application topically daily as needed (for eczema).  03/18/19  Yes Rutherford Guys, MD  diphenhydrAMINE (BENADRYL) 25 MG tablet Take 25 mg by mouth every 6 (six) hours as needed. Patient not taking: Reported on 08/07/2019    [provider]  EPINEPHrine (EPIPEN 2-PAK) 0.3 mg/0.3 mL IJ SOAJ injection Inject 0.3 mLs (0.3 mg total) into the muscle as needed for anaphylaxis. 08/10/18   Bobbitt, Sedalia Muta, MD  fexofenadine (ALLEGRA) 180 MG tablet Take 1 tablet (180 mg total) by mouth daily. Patient not taking: Reported on 08/07/2019 08/10/18   Bobbitt, Sedalia Muta, MD  glipiZIDE (GLUCOTROL) 5 MG tablet TAKE 1 TABLET BY MOUTH  TWICE DAILY BEFORE A MEAL Patient not taking:  Reported on 08/07/2019 05/08/19   Rutherford Guys, MD  montelukast (SINGULAIR) 10 MG tablet Take 10 mg by mouth daily. Patient not taking: Reported on 08/07/2019 12/14/18   [provider]  nicotine polacrilex (NICOTINE MINI) 2 MG lozenge Take 1 lozenge (2 mg total) by mouth as needed for smoking cessation. Patient not taking: Reported on 08/07/2019 10/21/18   Laurin Coder, MD  predniSONE (DELTASONE) 10 MG tablet Take 2 tablets (20 mg total) by mouth daily for 3 days. 08/08/19 08/11/19  Gareth Morgan, MD  varenicline (CHANTIX STARTING MONTH PAK) 0.5 MG X 11 & 1 MG X 42 tablet Take one 0.5 mg tablet by mouth once daily for 3 days, then increase to one 0.5 mg tablet twice daily for 4 days, then increase to one 1 mg tablet twice daily. Patient not taking: Reported on 08/07/2019 10/21/18   Laurin Coder, MD    Allergies  Lisinopril  Review of Systems   Review of Systems  Constitutional: Negative for fever.  HENT: Positive for trouble swallowing and voice change. Negative for sore throat.   Eyes: Negative for visual disturbance.  Respiratory: Negative for shortness of breath.   Cardiovascular: Negative for chest pain.  Gastrointestinal: Negative for abdominal pain, nausea and vomiting.  Genitourinary: Negative for difficulty urinating.  Musculoskeletal: Negative for back pain and neck stiffness.  Skin: Negative for rash.  Neurological: Negative for syncope and headaches.    Physical Exam Updated Vital Signs BP 121/80   Pulse 69   Temp 98.2 F (36.8 C) (Oral)   Resp 18   Ht 5\' 5"  (1.651 m)   Wt 61.7 kg   SpO2 93%   BMI 22.63 kg/m   Physical Exam Vitals and nursing note reviewed.  Constitutional:      General: He is not in acute distress.    Appearance: He is well-developed. He is not diaphoretic.  HENT:     Head: Normocephalic and atraumatic.     Comments: Tongue swelling, no oropharyngeal swelling Eyes:     Conjunctiva/sclera: Conjunctivae normal.   Cardiovascular:     Rate and Rhythm: Normal rate and regular rhythm.     Heart sounds: Normal heart sounds. No murmur heard.  No friction rub. No gallop.   Pulmonary:     Effort: Pulmonary effort is normal. No respiratory distress.     Breath sounds: Normal breath sounds. No stridor. No wheezing or rales.  Abdominal:     General: There is no distension.     Palpations: Abdomen is soft.     Tenderness: There is no abdominal tenderness. There is no guarding.  Musculoskeletal:     Cervical back: Normal range of motion.  Skin:    General: Skin is warm and dry.  Neurological:     Mental Status: He is alert and oriented to person, place, and time.     ED Results / Procedures / Treatments   Labs (all labs ordered are listed, but only abnormal results are displayed) Labs Reviewed - No data to display  EKG None  Radiology No results found.  Procedures .Critical Care Performed by: Gareth Morgan, MD Authorized by: Gareth Morgan, MD   Critical care provider statement:    Critical care time (minutes):  30   Critical care was time spent personally by me on the following activities:  Evaluation of patient's response to treatment, examination of patient, pulse oximetry, re-evaluation of patient's condition, obtaining history from patient or surrogate and review of old charts   (including critical care time)  Medications Ordered in ED Medications  diphenhydrAMINE (BENADRYL) injection 50 mg (50 mg Intravenous Given 08/07/19 2110)  famotidine (PEPCID) IVPB 20 mg premix (0 mg Intravenous Stopped 08/08/19 0030)  methylPREDNISolone sodium succinate (SOLU-MEDROL) 125 mg/2 mL injection 125 mg (125 mg Intravenous Given 08/07/19 2111)    ED Course  I have reviewed the triage vital signs and the nursing notes.  Pertinent labs & imaging results that were available during my care of the patient were reviewed by me and considered in my medical decision making (see chart for details).     MDM Rules/Calculators/A&P                          74 year old male with history of diabetes, hypertension, neuromuscular disorder, peripheral artery disease, prostate cancer, history of recurrent idiopathic angioedema, presents with concern for tongue swelling.  Exam and  history consistent with angioedema.  Does not have signs or symptoms to suggest anaphylaxis.  No sign of pharyngeal swelling, no stridor, no wheezing, no dyspnea, no drooling.  Given Solu-Medrol, Benadryl and ranitidine with improvement.  He reports many episodes in the past which had similarly improved in the emergency department he was discharged.  He tells me he has been discharged without period of observation, however discussed we would like to observe him for 4 hours to ensure no worsening symptoms.   Observed in the emergency department with continued improvement in swelling.  Given prescription for prednisone for 3 days. Patient discharged in stable condition with understanding of reasons to return.    Final Clinical Impression(s) / ED Diagnoses Final diagnoses:  Angioedema, subsequent encounter    Rx / DC Orders ED Discharge Orders         Ordered    predniSONE (DELTASONE) 10 MG tablet  Daily     Discontinue  Reprint     08/08/19 0023           Gareth Morgan, MD 08/09/19 [id number]

## 2019-09-01 ENCOUNTER — Other Ambulatory Visit: Payer: Self-pay | Admitting: Family Medicine

## 2019-09-02 ENCOUNTER — Other Ambulatory Visit: Payer: Self-pay | Admitting: Family Medicine

## 2019-09-02 DIAGNOSIS — M5416 Radiculopathy, lumbar region: Secondary | ICD-10-CM

## 2019-09-02 MED ORDER — HYDROCODONE-ACETAMINOPHEN 10-325 MG PO TABS: ORAL_TABLET | ORAL | 0 refills | Status: DC

## 2019-09-02 MED FILL — HYDROCODON-APAP 10-325: 10-325 | 5 days supply | Qty: 20 | Fill #0

## 2019-09-02 NOTE — Telephone Encounter (Signed)
Copied from [place] [id number]. Topic: Quick Communication - Rx Refill/Question >> Sep 02, 2019  9:35 AM Yvette Rack wrote: Medication: HYDROcodone-acetaminophen (NORCO) 10-325 MG tablet  Has the patient contacted their pharmacy? no  Preferred Pharmacy (with phone number or street name): [place], [place] - [address] [facility].  Phone: [phone] Fax: [phone]  Agent: Please be advised that RX refills may take up to 3 business days. We ask that you follow-up with your pharmacy.

## 2019-09-02 NOTE — Telephone Encounter (Signed)
Please review for refill. Refill not delegated.  Last refill: 07/01/19 #20

## 2019-09-02 NOTE — Telephone Encounter (Signed)
pmp reviewd, appropriate meds refilled 

## 2019-09-02 NOTE — Telephone Encounter (Signed)
Requested  medications are  due for refill today yes  Requested medications are on the active medication list yes  Last refill 6/7  Last visit 04/2019   Notes to clinic Not Delegated

## 2019-09-06 ENCOUNTER — Other Ambulatory Visit: Payer: Self-pay

## 2019-09-06 ENCOUNTER — Ambulatory Visit (INDEPENDENT_AMBULATORY_CARE_PROVIDER_SITE_OTHER)
Admission: RE | Admit: 2019-09-06 | Discharge: 2019-09-06 | Disposition: A | Discharge status: Home / Self Care | Payer: Medicare Other | Source: Ambulatory Visit | Attending: Acute Care | Admitting: Acute Care

## 2019-09-06 DIAGNOSIS — F17210 Nicotine dependence, cigarettes, uncomplicated: Secondary | ICD-10-CM | POA: Diagnosis not present

## 2019-09-06 DIAGNOSIS — Z122 Encounter for screening for malignant neoplasm of respiratory organs: Secondary | ICD-10-CM

## 2019-09-06 DIAGNOSIS — Z87891 Personal history of nicotine dependence: Secondary | ICD-10-CM

## 2019-09-07 ENCOUNTER — Other Ambulatory Visit: Payer: Self-pay

## 2019-09-07 DIAGNOSIS — I10 Essential (primary) hypertension: Secondary | ICD-10-CM

## 2019-09-07 DIAGNOSIS — J310 Chronic rhinitis: Secondary | ICD-10-CM

## 2019-09-07 DIAGNOSIS — E785 Hyperlipidemia, unspecified: Secondary | ICD-10-CM

## 2019-09-07 DIAGNOSIS — E1159 Type 2 diabetes mellitus with other circulatory complications: Secondary | ICD-10-CM

## 2019-09-07 MED ORDER — MONTELUKAST SODIUM 10 MG PO TABS: 10.0000 mg | ORAL_TABLET | Freq: Every day | ORAL | 3 refills | Status: DC

## 2019-09-07 MED ORDER — METFORMIN HCL 1000 MG PO TABS: 1000.0000 mg | ORAL_TABLET | Freq: Two times a day (BID) | ORAL | 1 refills | Status: DC

## 2019-09-07 MED ORDER — ATORVASTATIN CALCIUM 40 MG PO TABS: 40.0000 mg | ORAL_TABLET | Freq: Every day | ORAL | 1 refills | Status: DC

## 2019-09-09 ENCOUNTER — Other Ambulatory Visit: Payer: Self-pay | Admitting: *Deleted

## 2019-09-09 DIAGNOSIS — F17210 Nicotine dependence, cigarettes, uncomplicated: Secondary | ICD-10-CM

## 2019-09-09 DIAGNOSIS — Z87891 Personal history of nicotine dependence: Secondary | ICD-10-CM

## 2019-09-09 NOTE — Progress Notes (Signed)
Please call patient and let them  know their  low dose Ct was read as a Lung RADS 2: nodules that are benign in appearance and behavior with a very low likelihood of becoming a clinically active cancer due to size or lack of growth. Recommendation per radiology is for a repeat LDCT in 12 months. .Please let them  know we will order and schedule their  annual screening scan for 08/2020. Please let them  know there was notation of CAD on their  scan.  Please remind the patient  that this is a non-gated exam therefore degree or severity of disease  cannot be determined. Please have them  follow up with their PCP regarding potential risk factor modification, dietary therapy or pharmacologic therapy if clinically indicated. Pt.  is  currently on statin therapy. Please place order for annual  screening scan for  08/2020 and fax results to PCP. Thanks so much. 

## 2019-10-14 ENCOUNTER — Other Ambulatory Visit: Payer: Self-pay | Admitting: Family Medicine

## 2019-10-26 ENCOUNTER — Other Ambulatory Visit: Payer: Self-pay

## 2019-10-26 ENCOUNTER — Ambulatory Visit (INDEPENDENT_AMBULATORY_CARE_PROVIDER_SITE_OTHER): Payer: Medicare Other | Admitting: Family Medicine

## 2019-10-26 ENCOUNTER — Ambulatory Visit: Payer: Medicare Other | Attending: Internal Medicine

## 2019-10-26 ENCOUNTER — Ambulatory Visit: Payer: Medicare Other

## 2019-10-26 ENCOUNTER — Encounter: Payer: Self-pay | Admitting: Family Medicine

## 2019-10-26 VITALS — BP 140/80 | HR 78 | Temp 97.4°F | Ht 60.0 in | Wt 131.6 lb

## 2019-10-26 DIAGNOSIS — E785 Hyperlipidemia, unspecified: Secondary | ICD-10-CM

## 2019-10-26 DIAGNOSIS — I10 Essential (primary) hypertension: Secondary | ICD-10-CM | POA: Diagnosis not present

## 2019-10-26 DIAGNOSIS — F17200 Nicotine dependence, unspecified, uncomplicated: Secondary | ICD-10-CM | POA: Diagnosis not present

## 2019-10-26 DIAGNOSIS — E1159 Type 2 diabetes mellitus with other circulatory complications: Secondary | ICD-10-CM | POA: Diagnosis not present

## 2019-10-26 DIAGNOSIS — Z23 Encounter for immunization: Secondary | ICD-10-CM

## 2019-10-26 DIAGNOSIS — Z8546 Personal history of malignant neoplasm of prostate: Secondary | ICD-10-CM

## 2019-10-26 DIAGNOSIS — M5416 Radiculopathy, lumbar region: Secondary | ICD-10-CM

## 2019-10-26 LAB — CMP14+EGFR
ALT: 8 IU/L (ref 0–44)
AST: 11 IU/L (ref 0–40)
Albumin/Globulin Ratio: 1.4 (ref 1.2–2.2)
Albumin: 4.4 g/dL (ref 3.7–4.7)
Alkaline Phosphatase: 107 IU/L (ref 44–121)
BUN/Creatinine Ratio: 13 (ref 10–24)
BUN: 14 mg/dL (ref 8–27)
Bilirubin Total: <0.2 mg/dL (ref 0.0–1.2)
CO2: 22 mmol/L (ref 20–29)
Calcium: 9.9 mg/dL (ref 8.6–10.2)
Chloride: 101 mmol/L (ref 96–106)
Creatinine, Ser: 1.05 mg/dL (ref 0.76–1.27)
GFR calc Af Amer: 81 mL/min/{1.73_m2} (ref >59)
GFR calc non Af Amer: 70 mL/min/{1.73_m2} (ref >59)
Globulin, Total: 3.2 g/dL (ref 1.5–4.5)
Glucose: 58 mg/dL — ABNORMAL LOW (ref 65–99)
Potassium: 3.9 mmol/L (ref 3.5–5.2)
Sodium: 137 mmol/L (ref 134–144)
Total Protein: 7.6 g/dL (ref 6.0–8.5)

## 2019-10-26 LAB — LIPID PANEL
Chol/HDL Ratio: 3.6 ratio (ref 0.0–5.0)
Cholesterol, Total: 145 mg/dL (ref 100–199)
HDL: 40 mg/dL (ref >39)
LDL Chol Calc (NIH): 85 mg/dL (ref 0–99)
Triglycerides: 109 mg/dL (ref 0–149)
VLDL Cholesterol Cal: 20 mg/dL (ref 5–40)

## 2019-10-26 LAB — POCT GLYCOSYLATED HEMOGLOBIN (HGB A1C): Hemoglobin A1C: 6.4 % — AB (ref 4.0–5.6)

## 2019-10-26 MED ORDER — TRIAMTERENE-HCTZ 37.5-25 MG PO TABS: 1.0000 | ORAL_TABLET | Freq: Every day | ORAL | 1 refills | Status: DC

## 2019-10-26 MED ORDER — GLIPIZIDE 5 MG PO TABS
ORAL_TABLET | ORAL | 1 refills | Status: DC
Start: 2019-10-26 — End: 2020-09-11

## 2019-10-26 MED ORDER — AMLODIPINE BESYLATE 10 MG PO TABS
10.0000 mg | ORAL_TABLET | Freq: Every day | ORAL | 1 refills | Status: DC
Start: 2019-10-26 — End: 2020-09-11

## 2019-10-26 MED ORDER — BUPROPION HCL ER (SR) 150 MG PO TB12: 150.0000 mg | ORAL_TABLET | Freq: Two times a day (BID) | ORAL | 1 refills | Status: DC

## 2019-10-26 NOTE — Progress Notes (Signed)
10/5/20218:25 AM  Alexander [name] [birth year], 74 y.o., male [id number]  Chief Complaint  Patient presents with  . Diabetes    wants to talk about quiting smoking and needs handicap parking permit  . Hypertension    HPI:   Patient is a 74 y.o. male with past medical history significant for DM2, HTN, DDD lumbar spine, PAD, prostate cancer who presents today for routine followup  Last OV April 2021 - no changes  He is overall doing well Had angioedema response to lisinopril in July He does not really check BP at home Smokes about one ppd x 60 years Most recently tried chantix for about one month - no sign help with cravings He has also tried the patch - very itchy Has never been able to stop smoking for about 2 weeks He had ct chest lung cancer screening aug 2021 - birads 2 He continues to work full time He cont to see urologist twice a year He does not check cbgs at home He denies sx of hypoglycemia Sciatica pain resolved after epidural injections He continues to take vicodin and tianadine prn Seeing ortho for carpal tunnel, right hand Will be getting covid booster today, declines flu for now  Lab Results  Component Value Date   HGBA1C 6.4 (H) 04/27/2019   HGBA1C 7.2 (A) 11/02/2018   HGBA1C 7.4 (H) 07/31/2018   Lab Results  Component Value Date   MICROALBUR 3.2 04/20/2015   LDLCALC 86 04/27/2019   CREATININE 1.08 04/27/2019   Lab Results  Component Value Date   CREATININE 1.08 04/27/2019   BUN 14 04/27/2019   NA 139 04/27/2019   K 3.8 04/27/2019   CL 104 04/27/2019   CO2 22 04/27/2019    Wt Readings from Last 3 Encounters:  10/26/19 131 lb 9.6 oz (59.7 kg)  08/07/19 136 lb (61.7 kg)  04/27/19 136 lb 12.8 oz (62.1 kg)   BP Readings from Last 3 Encounters:  10/26/19 140/80  08/07/19 121/80  04/27/19 118/64    Depression screen PHQ 2/9 04/27/2019 03/18/2019 01/01/2019  Decreased Interest 0 0 0  Down, Depressed, Hopeless 0 0 0  PHQ - 2 Score 0 0 0     Fall Risk  04/27/2019 03/18/2019 01/01/2019 11/11/2018 11/02/2018  Falls in the past year? 0 0 0 0 0  Number falls in past yr: 0 0 0 0 0  Injury with Fall? 0 0 0 0 0  Follow up - Falls evaluation completed - Falls evaluation completed;Education provided;Falls prevention discussed -     Allergies  Allergen Reactions  . Lisinopril Swelling    angioedema    Prior to Admission medications   Medication Sig Start Date End Date Taking? Authorizing Provider  amLODipine (NORVASC) 10 MG tablet TAKE 1 TABLET BY MOUTH  DAILY 10/14/19  Yes Rutherford Guys, MD  aspirin EC 81 MG tablet Take 81 mg by mouth daily as needed for mild pain.    Yes [provider]  Aspirin-Salicylamide-Caffeine (BC HEADACHE POWDER PO) Take 1 Package by mouth daily as needed (for back pain).   Yes [provider]  atorvastatin (LIPITOR) 40 MG tablet Take 1 tablet (40 mg total) by mouth daily. 09/07/19  Yes Rutherford Guys, MD  diphenhydrAMINE (BENADRYL) 25 MG tablet Take 25 mg by mouth every 6 (six) hours as needed.    Yes [provider]  EPINEPHrine (EPIPEN 2-PAK) 0.3 mg/0.3 mL IJ SOAJ injection Inject 0.3 mLs (0.3 mg total) into the muscle as  needed for anaphylaxis. 08/10/18  Yes Bobbitt, Sedalia Muta, MD  fexofenadine (ALLEGRA) 180 MG tablet Take 1 tablet (180 mg total) by mouth daily. 08/10/18  Yes Bobbitt, Sed[place] Muta, MD  fluticasone [facility]) 50 MCG/ACT nasal spray Place 2 sprays into both nostrils daily. 03/18/19  Yes Rutherford Guys, MD  gabapentin (NEURONTIN) 300 MG capsule TAKE 3 CAPSULES BY MOUTH  TWICE DAILY 09/02/19  Yes Rutherford Guys, MD  glipiZIDE (GLUCOTROL) 5 MG tablet TAKE 1 TABLET BY MOUTH  TWICE DAILY BEFORE A MEAL 05/08/19  Yes Rutherford Guys, MD  hydrochlorothiazide (HYDRODIURIL) 25 MG tablet TAKE 1 TABLET BY MOUTH  DAILY 09/02/19  Yes Rutherford Guys, MD  HYDROcodone-acetaminophen (NORCO) 10-325 MG tablet TAKE 1 TABLET BY MOUTH EVERY 6 (SIX) HOURS AS NEEDED FOR MODERATE  PAIN. 09/02/19  Yes Rutherford Guys, MD  meloxicam (MOBIC) 7.5 MG tablet TAKE 1 TABLET BY MOUTH  DAILY AS NEEDED FOR PAIN Patient taking differently: Take 7.5 mg by mouth daily as needed for pain.  05/19/19  Yes Rutherford Guys, MD  metFORMIN (GLUCOPHAGE) 1000 MG tablet Take 1 tablet (1,000 mg total) by mouth 2 (two) times daily with a meal. 09/07/19  Yes Rutherford Guys, MD  montelukast (SINGULAIR) 10 MG tablet Take 1 tablet (10 mg total) by mouth daily. 09/07/19  Yes Rutherford Guys, MD  Multiple Vitamin (MULTIVITAMIN WITH MINERALS) TABS tablet Take 1 tablet by mouth daily.   Yes [provider]  nicotine polacrilex (NICOTINE MINI) 2 MG lozenge Take 1 lozenge (2 mg total) by mouth as needed for smoking cessation. 10/21/18  Yes Olalere, Adewale A, MD  sildenafil (VIAGRA) 100 MG tablet Take 100 mg by mouth as needed for erectile dysfunction.    Yes [provider]  tiZANidine (ZANAFLEX) 4 MG tablet Take 1 tablet (4 mg total) by mouth daily as needed for muscle spasms. 09/02/19  Yes Rutherford Guys, MD  triamcinolone cream (KENALOG) 0.1 % APPLY TOPICALLY TWICE DAILY 09/02/19  Yes Rutherford Guys, MD    Past Medical History:  Diagnosis Date  . Angio-edema   . Diabetes mellitus without complication ([place])   . Hypertension   . Neuromuscular disorder ([place])   . Peripheral arterial disease ([place]) 11/14/2016   non-occlusive, asymptomatic, seen by vascular surgery Dr. Oneida Alar who rec repeated ABIs annually  . Prostate cancer [facility])     Past Surgical History:  Procedure Laterality Date  . COLON SURGERY     "locked bowel" fixed  . HERNIA REPAIR    . LEFT HEART CATHETERIZATION WITH CORONARY ANGIOGRAM N/A 09/15/2013   Procedure: LEFT HEART CATHETERIZATION WITH CORONARY ANGIOGRAM;  Surgeon: Peter M Martinique, MD;  Location: [facility] CATH LAB;  Service: Cardiovascular;  Laterality: N/A;  . PROSTATE SURGERY  2005    Social History   Tobacco Use  . Smoking status: Current Every Day Smoker     Packs/day: 1.00    Years: 55.00    Pack years: 55.00    Types: Cigarettes  . Smokeless tobacco: Never Used  Substance Use Topics  . Alcohol use: No    Family History  Problem Relation Age of Onset  . Diabetes Mother   . Depression Father   . Hyperlipidemia Sister   . Breast cancer Neg Hx   . Prostate cancer Neg Hx   . Colon cancer Neg Hx     Review of Systems  Constitutional: Negative for chills and fever.  Respiratory: Negative for cough and shortness of breath.  Cardiovascular: Negative for chest pain, palpitations and leg swelling.  Gastrointestinal: Negative for abdominal pain, nausea and vomiting.  per hpi   OBJECTIVE:  Today's Vitals   10/26/19 0820  BP: 140/80  Pulse: 78  Temp: (!) 97.4 F (36.3 C)  SpO2: 94%  Weight: 131 lb 9.6 oz (59.7 kg)  Height: 5' (1.524 m)   Body mass index is 25.7 kg/m.   Physical Exam Vitals and nursing note reviewed.  Constitutional:      Appearance: He is well-developed.  HENT:     Head: Normocephalic and atraumatic.  Eyes:     Extraocular Movements: Extraocular movements intact.     Conjunctiva/sclera: Conjunctivae normal.     Pupils: Pupils are equal, round, and reactive to light.  Cardiovascular:     Rate and Rhythm: Normal rate and regular rhythm.     Heart sounds: No murmur heard.  No friction rub. No gallop.   Pulmonary:     Effort: Pulmonary effort is normal.     Breath sounds: Normal breath sounds. No wheezing, rhonchi or rales.  Musculoskeletal:     Cervical back: Neck supple.  Skin:    General: Skin is warm and dry.  Neurological:     Mental Status: He is alert and oriented to person, place, and time.     Results for orders placed or performed in visit on 10/26/19 (from the past 24 hour(s))  POCT glycosylated hemoglobin (Hb A1C)     Status: Abnormal   Collection Time: 10/26/19  8:34 AM  Result Value Ref Range   Hemoglobin A1C 6.4 (A) 4.0 - 5.6 %   HbA1c POC (<> result, manual entry)     HbA1c, POC  (prediabetic range)     HbA1c, POC (controlled diabetic range)      No results found.   ASSESSMENT and PLAN  1. Essential hypertension Controlled. Continue current regime.  - Lipid panel - CMP14+EGFR  2. Hyperlipidemia LDL goal <70 Checking labs today, medications will be adjusted as needed.  - Lipid panel - CMP14+EGFR  3. Type 2 diabetes mellitus with other circulatory complications (HCC) Controlled. Continue current regime.  - Lipid panel - CMP14+EGFR - POCT glycosylated hemoglobin (Hb A1C) - Microalbumin / creatinine urine ratio - Ambulatory referral to Ophthalmology  4. Tobacco use disorder Discussed treatment options, trial of wellbutrin, reviewed r/se/b, declines referral to CBT  5. History of prostate cancer Managed by urology  6. Lumbar radiculopathy, chronic Stable. Renewed disability placard.   Other orders - buPROPion (WELLBUTRIN SR) 150 MG 12 hr tablet; Take 1 tablet (150 mg total) by mouth 2 (two) times daily. - amLODipine (NORVASC) 10 MG tablet; Take 1 tablet (10 mg total) by mouth daily. - glipiZIDE (GLUCOTROL) 5 MG tablet; TAKE 1 TABLET BY MOUTH  TWICE DAILY BEFORE A MEAL - triamterene-hydrochlorothiazide (MAXZIDE-25) 37.5-25 MG tablet; Take 1 tablet by mouth daily.  Return in about 6 months (around 04/25/2020).    Rutherford Guys, MD Primary Care at [place], [place] [address] Ph.  [phone] Fax [phone]

## 2019-10-26 NOTE — Progress Notes (Signed)
   Covid-19 Vaccination Clinic  Name:  Alexander [name]    MRN: [id number] DOB: Apr 16, 1945  10/26/2019  Alexander [name] was observed post Covid-19 immunization for 15 minutes without incident. He was provided with Vaccine Information Sheet and instruction to access the V-Safe system.   Alexander [name] was instructed to call 911 with any severe reactions post vaccine: Marland Kitchen Difficulty breathing  . Swelling of face and throat  . A fast heartbeat  . A bad rash all over body  . Dizziness and weakness

## 2019-10-26 NOTE — Patient Instructions (Signed)
° ° ° °  If you have lab work done today you will be contacted with your lab results within the next 2 weeks.  If you have not heard from us then please contact us. The fastest way to get your results is to register for My Chart. ° ° °IF you received an x-ray today, you will receive an invoice from [facility]. Please contact [facility] at [phone] with questions or concerns regarding your invoice.  ° °IF you received labwork today, you will receive an invoice from [facility]. Please contact [facility] at [phone] with questions or concerns regarding your invoice.  ° °Our billing staff will not be able to assist you with questions regarding bills from these companies. ° °You will be contacted with the lab results as soon as they are available. The fastest way to get your results is to activate your My Chart account. Instructions are located on the last page of this paperwork. If you have not heard from us regarding the results in 2 weeks, please contact this office. °  ° ° ° °

## 2019-10-27 ENCOUNTER — Other Ambulatory Visit: Payer: Self-pay | Admitting: Family Medicine

## 2019-10-27 LAB — MICROALBUMIN / CREATININE URINE RATIO
Creatinine, Urine: 149.2 mg/dL
Microalb/Creat Ratio: 17 mg/g creat (ref 0–29)
Microalbumin, Urine: 25 ug/mL

## 2020-01-07 ENCOUNTER — Other Ambulatory Visit: Payer: Self-pay

## 2020-01-07 MED ORDER — MELOXICAM 7.5 MG PO TABS: 7.5000 mg | ORAL_TABLET | Freq: Every day | ORAL | 1 refills | Status: DC | PRN

## 2020-01-07 NOTE — Telephone Encounter (Signed)
Patient is requesting a refill of the following medications: Requested Prescriptions   Pending Prescriptions Disp Refills   meloxicam (MOBIC) 7.5 MG tablet 90 tablet 1    Sig: Take 1 tablet (7.5 mg total) by mouth daily as needed. for pain    Date of patient request: 01/06/2020 Last office visit: 10/26/2019 Date of last refill: 05/19/2019 Last refill amount: 90 tablets 1 refill Follow up time period per chart: 04/26/20

## 2020-02-07 ENCOUNTER — Other Ambulatory Visit: Payer: Self-pay

## 2020-02-07 DIAGNOSIS — E785 Hyperlipidemia, unspecified: Secondary | ICD-10-CM

## 2020-02-07 MED ORDER — ATORVASTATIN CALCIUM 40 MG PO TABS: 40.0000 mg | ORAL_TABLET | Freq: Every day | ORAL | 1 refills | Status: DC

## 2020-02-11 ENCOUNTER — Other Ambulatory Visit: Payer: Self-pay | Admitting: Emergency Medicine

## 2020-02-11 DIAGNOSIS — M5416 Radiculopathy, lumbar region: Secondary | ICD-10-CM

## 2020-02-11 NOTE — Telephone Encounter (Signed)
Patient is requesting a refill of the following medications: Requested Prescriptions   Pending Prescriptions Disp Refills   HYDROcodone-acetaminophen (NORCO) 10-325 MG tablet 20 tablet 0    Sig: TAKE 1 TABLET BY MOUTH EVERY 6 (SIX) HOURS AS NEEDED FOR MODERATE PAIN.    Date of patient request: 02/11/20 Last office visit: 10/26/19 Date of last refill: 09/02/19 Last refill amount: 20 +0 Follow up time period per chart: 04/26/20

## 2020-02-11 NOTE — Telephone Encounter (Signed)
Copied from [place] [id number]. Topic: Quick Communication - Rx Refill/Question >> Feb 11, 2020  3:15 PM Leward Quan A wrote: Medication: HYDROcodone-acetaminophen (NORCO) 10-325 MG tablet   Has the patient contacted their pharmacy? Yes.   (Agent: If no, request that the patient contact the pharmacy for the refill.) (Agent: If yes, when and what did the pharmacy advise?)  Preferred Pharmacy (with phone number or street name): [place], [place] - [address] [facility].  Phone:  [phone] Fax:  [phone]     Agent: Please be advised that RX refills may take up to 3 business days. We ask that you follow-up with your pharmacy.

## 2020-02-11 NOTE — Telephone Encounter (Signed)
Requested medication (s) are due for refill today- unsure  Requested medication (s) are on the active medication list -yes  Future visit scheduled -yes  Last refill: 09/02/19  Notes to clinic: Request non delegated rx  Requested Prescriptions  Pending Prescriptions Disp Refills   HYDROcodone-acetaminophen (NORCO) 10-325 MG tablet 20 tablet 0    Sig: TAKE 1 TABLET BY MOUTH EVERY 6 (SIX) HOURS AS NEEDED FOR MODERATE PAIN.      Not Delegated - Analgesics:  Opioid Agonist Combinations Failed - 02/11/2020  3:38 PM      Failed - This refill cannot be delegated      Failed - Urine Drug Screen completed in last 360 days      Passed - Valid encounter within last 6 months    Recent Outpatient Visits           3 months ago Essential hypertension   Primary Care at [facility], Lilia Argue, MD   9 months ago Essential hypertension   Primary Care at [facility], Lilia Argue, MD   11 months ago Paresthesias in right hand   Primary Care at [facility], Lilia Argue, MD   1 year ago Right ear impacted cerumen   Primary Care at [facility], Lilia Argue, MD   1 year ago Medicare annual wellness visit, subsequent   Primary Care at [facility], Arlie Solomons, MD       Future Appointments             In 2 months Carlota Raspberry Ranell Patrick, MD Primary Care at [place], [facility]                 Requested Prescriptions  Pending Prescriptions Disp Refills   HYDROcodone-acetaminophen ([place]) 10-325 MG tablet 20 tablet 0    Sig: TAKE 1 TABLET BY MOUTH EVERY 6 (SIX) HOURS AS NEEDED FOR MODERATE PAIN.      Not Delegated - Analgesics:  Opioid Agonist Combinations Failed - 02/11/2020  3:38 PM      Failed - This refill cannot be delegated      Failed - Urine Drug Screen completed in last 360 days      Passed - Valid encounter within last 6 months    Recent Outpatient Visits           3 months ago Essential hypertension   Primary Care at [facility], Lilia Argue, MD   9 months ago  Essential hypertension   Primary Care at [facility], Lilia Argue, MD   11 months ago Paresthesias in right hand   Primary Care at [facility], Lilia Argue, MD   1 year ago Right ear impacted cerumen   Primary Care at [facility], Lilia Argue, MD   1 year ago Medicare annual wellness visit, subsequent   Primary Care at [facility], Arlie Solomons, MD       Future Appointments             In 2 months Carlota Raspberry Ranell Patrick, MD Primary Care at [place], [facility]

## 2020-02-12 ENCOUNTER — Other Ambulatory Visit: Payer: Self-pay | Admitting: Family Medicine

## 2020-02-12 MED ORDER — HYDROCODONE-ACETAMINOPHEN 10-325 MG PO TABS: ORAL_TABLET | ORAL | 0 refills | Status: DC

## 2020-02-12 NOTE — Telephone Encounter (Signed)
Previous patient of Dr. Pamella Pert.  Chronic lumbar pain/radiculopathy noted previously, discussed in October with intermittent use of Vicodin.  Controlled substance database reviewed.  Hydrocodone 10/325 #20 last filled on September 02, 2019.  Previous prescription in June.  Consistent prescriptions from Dr. Pamella Pert.  Appointment scheduled with me in April.  Hydrocodone refill ordered.

## 2020-02-14 MED FILL — HYDROCODON-APAP 10-325: 10-325 | 5 days supply | Qty: 20 | Fill #0

## 2020-04-19 ENCOUNTER — Ambulatory Visit: Payer: Medicare Other | Admitting: Nurse Practitioner

## 2020-04-26 ENCOUNTER — Encounter: Payer: Self-pay | Admitting: Family Medicine

## 2020-05-10 ENCOUNTER — Ambulatory Visit (INDEPENDENT_AMBULATORY_CARE_PROVIDER_SITE_OTHER): Payer: Medicare Other | Admitting: Nurse Practitioner

## 2020-05-10 ENCOUNTER — Other Ambulatory Visit (HOSPITAL_COMMUNITY): Payer: Self-pay

## 2020-05-10 ENCOUNTER — Other Ambulatory Visit: Payer: Self-pay

## 2020-05-10 VITALS — BP 148/80 | HR 81 | Temp 97.8°F | Ht 65.0 in | Wt 135.0 lb

## 2020-05-10 DIAGNOSIS — T783XXD Angioneurotic edema, subsequent encounter: Secondary | ICD-10-CM | POA: Diagnosis not present

## 2020-05-10 DIAGNOSIS — F17200 Nicotine dependence, unspecified, uncomplicated: Secondary | ICD-10-CM | POA: Diagnosis not present

## 2020-05-10 DIAGNOSIS — I700 Atherosclerosis of aorta: Secondary | ICD-10-CM

## 2020-05-10 DIAGNOSIS — I10 Essential (primary) hypertension: Secondary | ICD-10-CM | POA: Diagnosis not present

## 2020-05-10 DIAGNOSIS — J439 Emphysema, unspecified: Secondary | ICD-10-CM | POA: Diagnosis not present

## 2020-05-10 DIAGNOSIS — J302 Other seasonal allergic rhinitis: Secondary | ICD-10-CM | POA: Diagnosis not present

## 2020-05-10 DIAGNOSIS — E1159 Type 2 diabetes mellitus with other circulatory complications: Secondary | ICD-10-CM

## 2020-05-10 DIAGNOSIS — Z8546 Personal history of malignant neoplasm of prostate: Secondary | ICD-10-CM

## 2020-05-10 DIAGNOSIS — R159 Full incontinence of feces: Secondary | ICD-10-CM

## 2020-05-10 DIAGNOSIS — M5416 Radiculopathy, lumbar region: Secondary | ICD-10-CM | POA: Diagnosis not present

## 2020-05-10 DIAGNOSIS — E785 Hyperlipidemia, unspecified: Secondary | ICD-10-CM

## 2020-05-10 MED ORDER — DULOXETINE HCL 30 MG PO CPEP
30.0000 mg | ORAL_CAPSULE | Freq: Every day | ORAL | 2 refills | Status: DC
Start: 2020-05-10 — End: 2020-09-11
  Filled 2020-05-10: qty 30, 30d supply, fill #0

## 2020-05-10 NOTE — Progress Notes (Signed)
Careteam: Patient Care Team: Lauree Chandler, NP as PCP - General (Geriatric Medicine) Carol Ada, MD as Consulting Physician (Gastroenterology) Warden Fillers, MD as Consulting Physician (Ophthalmology) Pamala Hurry, MD as Referring Physician (Urology) Cira Rue, RN Nurse Navigator as Registered Nurse (Medical Oncology) Jola Schmidt, MD as Consulting Physician (Ophthalmology)  PLACE OF SERVICE:  [place] Directive information Does Patient Have a Medical Advance Directive?: No, Would patient like information on creating a medical advance directive?: Yes (MAU/Ambulatory/Procedural Areas - Information given)  Allergies  Allergen Reactions  . Lisinopril Swelling    angioedema    Chief Complaint  Patient presents with  . Establish Care    New patient establish care. Medication bottles present with the exception of viagra, zanaflex, kenalog, and maxide. Patient manages his own medications. Discuss fecal incontinent, right side hip/buttock pain. Here with daughter Aniceto Boss      HPI: Patient is a 75 y.o. male to establish care He was previously seen at [place] and now needing a new PCP.   No AWV or physical in the last year.   Hypertension- has not taken medication this morning. Taking amlodipine with triamterene-hctz  CAD/PAD- denies pain in legs with walking. No chest pains.   Smoker- tired Wellbutrin but could not take.daughter tries to get him to quit smoking. Having yearly CT  Seasonal allergies- uses benadryl as needed and uses allegra and singulair daily , fluticasone PRN  Chronic back pain with sciatica- uses gabapentin 900 mg by mouth twice daily, taking mobic 7.5 mg by mouth daily PRN pain Reports seeing neurosurgery doctor Dr Brien Few- in the past Sciatica pain resolved after epidural injections  has had hydrocodone-apap prescribed for pain.  Diabetes- does not check blood sugar at home. Last a1c was 6.1 by the nurse in the home. No low  blood sugars. Taking glipizide 5 mg by mouth twice daily and metformin 1000 mg by mouth twice daily. Reports he tries to eat right but daughter reports he likes junk food.   Angioedema- has seen the allergies for this, has epi-pen if needed   Prostate cancer- in remission. Previously seeing Dr Nevada Crane. Planning to see [facility], Dr Jeffie Pollock.  ED- using viagra PRN  Has appt with Dr Benson Norway for colonscopy to be scheduled.    Has fecal incontinence   Review of Systems:  Review of Systems  Constitutional: Negative for chills, fever and weight loss.  HENT: Negative for tinnitus.   Respiratory: Negative for cough, sputum production and shortness of breath.   Cardiovascular: Negative for chest pain, palpitations and leg swelling.  Gastrointestinal: Negative for abdominal pain, constipation, diarrhea and heartburn.  Genitourinary: Negative for dysuria, frequency and urgency.  Musculoskeletal: Negative for back pain, falls, joint pain and myalgias.  Skin: Negative.   Neurological: Negative for dizziness and headaches.  Psychiatric/Behavioral: Negative for depression and memory loss. The patient does not have insomnia.     Past Medical History:  Diagnosis Date  . Angio-edema   . CAD (coronary artery disease)    Per [facility] New Patient Packet   . Carpal tunnel syndrome    Per [facility] New Patient Packet   . Diabetes mellitus without complication ([place])   . Hypertension   . Neuromuscular disorder ([place])   . Peripheral arterial disease ([place]) 11/14/2016   non-occlusive, asymptomatic, seen by vascular surgery Dr. Oneida Alar who rec repeated ABIs annually  . Prostate cancer [facility])    Past Surgical History:  Procedure Laterality Date  . COLON SURGERY     "  locked bowel" fixed  . COLONOSCOPY  2019  . HERNIA REPAIR    . LEFT HEART CATHETERIZATION WITH CORONARY ANGIOGRAM N/A 09/15/2013   Procedure: LEFT HEART CATHETERIZATION WITH CORONARY ANGIOGRAM;  Surgeon: Peter M Martinique, MD;  [facility];   Service: Cardiovascular;  Laterality: N/A;  . PROSTATE SURGERY  2005   Social History:   reports that he has been smoking cigarettes. He has a 55.00 pack-year smoking history. He has never used smokeless tobacco. He reports that he does not drink alcohol and does not use drugs.  Family History  Problem Relation Age of Onset  . Diabetes Mother   . Depression Father   . Hypertension Father   . Diabetes Father   . Thyroid disease Father   . Hypertension Sister   . Diabetes Sister   . Congestive Heart Failure Sister   . Lung cancer Brother   . Kidney failure Sister   . Diabetes Sister   . Breast cancer Neg Hx   . Prostate cancer Neg Hx   . Colon cancer Neg Hx     Medications: Patient's Medications  New Prescriptions   No medications on file  Previous Medications   AMLODIPINE (NORVASC) 10 MG TABLET    Take 1 tablet (10 mg total) by mouth daily.   ASPIRIN EC 81 MG TABLET    Take 81 mg by mouth daily as needed for mild pain.    ASPIRIN-SALICYLAMIDE-CAFFEINE (BC HEADACHE POWDER PO)    Take 1 Package by mouth daily as needed (for back pain).   BUPROPION (WELLBUTRIN SR) 150 MG 12 HR TABLET    Take 1 tablet (150 mg total) by mouth 2 (two) times daily.   DIPHENHYDRAMINE (BENADRYL) 25 MG TABLET    Take 25 mg by mouth every 6 (six) hours as needed.    EPINEPHRINE (EPIPEN 2-PAK) 0.3 MG/0.3 ML IJ SOAJ INJECTION    Inject 0.3 mLs (0.3 mg total) into the muscle as needed for anaphylaxis.   FEXOFENADINE (ALLEGRA) 180 MG TABLET    Take 1 tablet (180 mg total) by mouth daily.   FLUTICASONE (FLONASE) 50 MCG/ACT NASAL SPRAY    Place 2 sprays into both nostrils daily.   GABAPENTIN (NEURONTIN) 300 MG CAPSULE    TAKE 3 CAPSULES BY MOUTH  TWICE DAILY   GLIPIZIDE (GLUCOTROL) 5 MG TABLET    TAKE 1 TABLET BY MOUTH  TWICE DAILY BEFORE A MEAL   HYDROCODONE-ACETAMINOPHEN (NORCO) 10-325 MG TABLET    TAKE 1 TABLET BY MOUTH EVERY 6 (SIX) HOURS AS NEEDED FOR MODERATE PAIN.   MELOXICAM (MOBIC) 7.5 MG TABLET     Take 1 tablet (7.5 mg total) by mouth daily as needed. for pain   METFORMIN (GLUCOPHAGE) 1000 MG TABLET    Take 1 tablet (1,000 mg total) by mouth 2 (two) times daily with a meal.   MONTELUKAST (SINGULAIR) 10 MG TABLET    Take 1 tablet (10 mg total) by mouth daily.   SILDENAFIL (VIAGRA) 100 MG TABLET    Take 100 mg by mouth as needed for erectile dysfunction.    TIZANIDINE (ZANAFLEX) 4 MG TABLET    Take 1 tablet (4 mg total) by mouth daily as needed for muscle spasms.   TRIAMCINOLONE CREAM (KENALOG) 0.1 %    APPLY TOPICALLY TWICE DAILY   TRIAMTERENE-HYDROCHLOROTHIAZIDE (MAXZIDE-25) 37.5-25 MG TABLET    Take 1 tablet by mouth daily.  Modified Medications   No medications on file  Discontinued Medications   ATORVASTATIN (LIPITOR) 40  MG TABLET    Take 1 tablet (40 mg total) by mouth daily.   MULTIPLE VITAMIN (MULTIVITAMIN WITH MINERALS) TABS TABLET    Take 1 tablet by mouth daily.   NICOTINE POLACRILEX (NICOTINE MINI) 2 MG LOZENGE    Take 1 lozenge (2 mg total) by mouth as needed for smoking cessation.    Physical Exam:  Vitals:   05/10/20 0929  BP: (!) 148/80  Pulse: 81  Temp: 97.8 F (36.6 C)  TempSrc: Temporal  SpO2: 98%  Weight: 135 lb (61.2 kg)  Height: 5\' 5"  (1.651 m)   Body mass index is 22.47 kg/m. Wt Readings from Last 3 Encounters:  05/10/20 135 lb (61.2 kg)  10/26/19 131 lb 9.6 oz (59.7 kg)  08/07/19 136 lb (61.7 kg)    Physical Exam Constitutional:      General: He is not in acute distress.    Appearance: He is well-developed. He is not diaphoretic.  HENT:     Head: Normocephalic and atraumatic.     Mouth/Throat:     Pharynx: No oropharyngeal exudate.  Eyes:     Conjunctiva/sclera: Conjunctivae normal.     Pupils: Pupils are equal, round, and reactive to light.  Cardiovascular:     Rate and Rhythm: Normal rate and regular rhythm.     Heart sounds: Normal heart sounds.  Pulmonary:     Effort: Pulmonary effort is normal.     Breath sounds: Normal breath  sounds.  Abdominal:     General: Bowel sounds are normal.     Palpations: Abdomen is soft.  Musculoskeletal:        General: No tenderness.     Cervical back: Normal range of motion and neck supple.  Skin:    General: Skin is warm and dry.  Neurological:     Mental Status: He is alert and oriented to person, place, and time.     Labs reviewed: Basic Metabolic Panel: Recent Labs    10/26/19 0828  NA 137  K 3.9  CL 101  CO2 22  GLUCOSE 58*  BUN 14  CREATININE 1.05  CALCIUM 9.9   Liver Function Tests: Recent Labs    10/26/19 0828  AST 11  ALT 8  ALKPHOS 107  BILITOT <0.2  PROT 7.6  ALBUMIN 4.4   No results for input(s): LIPASE, AMYLASE in the last 8760 hours. No results for input(s): AMMONIA in the last 8760 hours. CBC: No results for input(s): WBC, NEUTROABS, HGB, HCT, MCV, PLT in the last 8760 hours. Lipid Panel: Recent Labs    10/26/19 0828  CHOL 145  HDL 40  LDLCALC 85  TRIG 109  CHOLHDL 3.6   TSH: No results for input(s): TSH in the last 8760 hours. A1C: Lab Results  Component Value Date   HGBA1C 6.4 (A) 10/26/2019     Assessment/Plan 1. Type 2 diabetes mellitus with other circulatory complications (HCC) -F6E has been well controlled in the past, no hypoglycemia.  -Encouraged dietary compliance, routine foot care/monitoring and to keep up with diabetic eye exams through ophthalmology  -continues on glipizide and metformin.  - Hemoglobin A1c  2. Tobacco use disorder -smoking cessation encouraged  3. Hyperlipidemia LDL goal <70 -not currently on a statin, likely will need to start (appears he was previously on but currently) - COMPLETE METABOLIC PANEL WITH GFR - Lipid Panel  4. HTN (hypertension), benign -has not had blood pressure medication.  - CBC with Differential/Platelet - COMPLETE METABOLIC PANEL WITH GFR  5. Left  lumbar radiculopathy Chronic low back pain, has been on hydrocodone-apap uses gabapentin 900 mg by mouth twice  daily, taking mobic 7.5 mg by mouth daily PRN pain Reports seeing neurosurgery doctor Dr Brien Few- in the past Sciatica pain resolved after epidural injections. No recent PT.  - Ambulatory referral to Physical Therapy for evaluation and treatment  - DULoxetine (CYMBALTA) 30 MG capsule; Take 1 capsule (30 mg total) by mouth daily.  Dispense: 30 capsule; Refill: 2 - Drug Screen 1 with Confirmation, Urine  6. Pulmonary emphysema, unspecified emphysema type ([place]) Noted on CT, recommended smoking cessation.   7. Aortic atherosclerosis ([place]) Noted on CT, to start on ASA 81 mg daily  8. Incontinence of feces, unspecified fecal incontinence type Encouraged use of fiber and GI appt pending.   9. Angioedema, subsequent encounter -thought to be due to ACEi. Has epipen if needed   10. History of prostate cancer Followed by urology at this time.   11. Seasonal allergies Ongoing, continue on  uses allegra and singulair daily , fluticasone PRN, recommend to stop benadryl due to adverse effects noted.   Next appt: 3 months Karenna Romanoff K. [place], [facility] [phone]

## 2020-05-10 NOTE — Patient Instructions (Addendum)
START benefiber twice daily Start ASA 81 mg EC daily for heart To start cymbalta 30 mg by mouth daily for pain Okay to use tylenol 1000 mg by mouth every 8 hours as needed pain Physical therapy can signficantly help your pain    To schedule AWV in 6 weeks  Follow up in 3 months for routine follow up.

## 2020-05-12 LAB — COMPLETE METABOLIC PANEL WITH GFR
AG Ratio: 1.4 (calc) (ref 1.0–2.5)
ALT: 9 U/L (ref 9–46)
AST: 12 U/L (ref 10–35)
Albumin: 4.2 g/dL (ref 3.6–5.1)
Alkaline phosphatase (APISO): 118 U/L (ref 35–144)
BUN: 13 mg/dL (ref 7–25)
CO2: 26 mmol/L (ref 20–32)
Calcium: 9.5 mg/dL (ref 8.6–10.3)
Chloride: 106 mmol/L (ref 98–110)
Creat: 0.97 mg/dL (ref 0.70–1.18)
GFR, Est African American: 89 mL/min/{1.73_m2} (ref >=60)
GFR, Est Non African American: 77 mL/min/{1.73_m2} (ref >=60)
Globulin: 3.1 g/dL (calc) (ref 1.9–3.7)
Glucose, Bld: 92 mg/dL (ref 65–99)
Potassium: 4.5 mmol/L (ref 3.5–5.3)
Sodium: 140 mmol/L (ref 135–146)
Total Bilirubin: 0.4 mg/dL (ref 0.2–1.2)
Total Protein: 7.3 g/dL (ref 6.1–8.1)

## 2020-05-12 LAB — HEMOGLOBIN A1C
Hgb A1c MFr Bld: 6.6 % of total Hgb — ABNORMAL HIGH (ref <5.7)
Mean Plasma Glucose: 143 mg/dL
eAG (mmol/L): 7.9 mmol/L

## 2020-05-12 LAB — CBC WITH DIFFERENTIAL/PLATELET
Absolute Monocytes: 406 cells/uL (ref 200–950)
Basophils Absolute: 58 cells/uL (ref 0–200)
Basophils Relative: 1 %
Eosinophils Absolute: 191 cells/uL (ref 15–500)
Eosinophils Relative: 3.3 %
HCT: 42.3 % (ref 38.5–50.0)
Hemoglobin: 13.9 g/dL (ref 13.2–17.1)
Lymphs Abs: 1061 cells/uL (ref 850–3900)
MCH: 30.3 pg (ref 27.0–33.0)
MCHC: 32.9 g/dL (ref 32.0–36.0)
MCV: 92.4 fL (ref 80.0–100.0)
MPV: 11.2 fL (ref 7.5–12.5)
Monocytes Relative: 7 %
Neutro Abs: 4083 cells/uL (ref 1500–7800)
Neutrophils Relative %: 70.4 %
Platelets: 265 10*3/uL (ref 140–400)
RBC: 4.58 10*6/uL (ref 4.20–5.80)
RDW: 12.8 % (ref 11.0–15.0)
Total Lymphocyte: 18.3 %
WBC: 5.8 10*3/uL (ref 3.8–10.8)

## 2020-05-12 LAB — DRUG MONITOR, PANEL 1, W/CONF, URINE
Amphetamines: NEGATIVE ng/mL (ref <500)
Barbiturates: NEGATIVE ng/mL (ref <300)
Benzodiazepines: NEGATIVE ng/mL (ref <100)
Cocaine Metabolite: NEGATIVE ng/mL (ref <150)
Codeine: NEGATIVE ng/mL (ref <50)
Creatinine: 152.7 mg/dL
Hydrocodone: 2209 ng/mL — ABNORMAL HIGH (ref <50)
Hydromorphone: 718 ng/mL — ABNORMAL HIGH (ref <50)
Marijuana Metabolite: NEGATIVE ng/mL (ref <20)
Methadone Metabolite: NEGATIVE ng/mL (ref <100)
Morphine: NEGATIVE ng/mL (ref <50)
Norhydrocodone: 1539 ng/mL — ABNORMAL HIGH (ref <50)
Opiates: POSITIVE ng/mL — AB (ref <100)
Oxidant: NEGATIVE ug/mL
Oxycodone: NEGATIVE ng/mL (ref <100)
Phencyclidine: NEGATIVE ng/mL (ref <25)
pH: 5.4 (ref 4.5–9.0)

## 2020-05-12 LAB — LIPID PANEL
Cholesterol: 96 mg/dL (ref <200)
HDL: 38 mg/dL — ABNORMAL LOW (ref >=40)
LDL Cholesterol (Calc): 44 mg/dL (calc)
Non-HDL Cholesterol (Calc): 58 mg/dL (calc) (ref <130)
Total CHOL/HDL Ratio: 2.5 (calc) (ref <5.0)
Triglycerides: 61 mg/dL (ref <150)

## 2020-05-12 LAB — DM TEMPLATE

## 2020-05-15 ENCOUNTER — Other Ambulatory Visit (HOSPITAL_COMMUNITY): Payer: Self-pay

## 2020-05-15 DIAGNOSIS — Z86010 Personal history of colonic polyps: Secondary | ICD-10-CM | POA: Diagnosis not present

## 2020-05-15 DIAGNOSIS — E119 Type 2 diabetes mellitus without complications: Secondary | ICD-10-CM | POA: Diagnosis not present

## 2020-05-15 DIAGNOSIS — I10 Essential (primary) hypertension: Secondary | ICD-10-CM | POA: Diagnosis not present

## 2020-05-15 MED ORDER — SUPREP BOWEL PREP KIT 17.5-3.13-1.6 GM/177ML PO SOLN
ORAL | 0 refills | Status: DC
  Filled 2020-05-15: qty 354, 1d supply, fill #0

## 2020-05-22 ENCOUNTER — Other Ambulatory Visit (HOSPITAL_COMMUNITY): Payer: Self-pay

## 2020-05-24 DIAGNOSIS — M48062 Spinal stenosis, lumbar region with neurogenic claudication: Secondary | ICD-10-CM | POA: Diagnosis not present

## 2020-05-30 DIAGNOSIS — D120 Benign neoplasm of cecum: Secondary | ICD-10-CM | POA: Diagnosis not present

## 2020-05-30 DIAGNOSIS — D123 Benign neoplasm of transverse colon: Secondary | ICD-10-CM | POA: Diagnosis not present

## 2020-05-30 DIAGNOSIS — K635 Polyp of colon: Secondary | ICD-10-CM | POA: Diagnosis not present

## 2020-05-30 DIAGNOSIS — Z86010 Personal history of colonic polyps: Secondary | ICD-10-CM | POA: Diagnosis not present

## 2020-05-30 DIAGNOSIS — K5730 Diverticulosis of large intestine without perforation or abscess without bleeding: Secondary | ICD-10-CM | POA: Diagnosis not present

## 2020-05-31 ENCOUNTER — Encounter: Payer: Self-pay | Admitting: Nurse Practitioner

## 2020-06-02 ENCOUNTER — Telehealth: Payer: Self-pay | Admitting: Family Medicine

## 2020-06-02 NOTE — Telephone Encounter (Signed)
Pt's daughter called in asking if Dr. Carlota Raspberry would take pt on as a new pt /TOC. Pt was a pt of [facility] then had a new pt appt with Dewaine Oats in April.   Was previously scheduled for a TOC with [place] on 04/26/20 but was cancelled due to Carlota Raspberry being out of the office.  Please advise

## 2020-06-06 ENCOUNTER — Other Ambulatory Visit: Payer: Self-pay

## 2020-06-06 ENCOUNTER — Ambulatory Visit: Payer: Medicare Other | Attending: Nurse Practitioner | Admitting: Physical Therapy

## 2020-06-06 ENCOUNTER — Encounter: Payer: Self-pay | Admitting: Physical Therapy

## 2020-06-06 DIAGNOSIS — R293 Abnormal posture: Secondary | ICD-10-CM | POA: Diagnosis not present

## 2020-06-06 DIAGNOSIS — M25652 Stiffness of left hip, not elsewhere classified: Secondary | ICD-10-CM | POA: Diagnosis not present

## 2020-06-06 DIAGNOSIS — M25651 Stiffness of right hip, not elsewhere classified: Secondary | ICD-10-CM | POA: Diagnosis not present

## 2020-06-06 DIAGNOSIS — M2560 Stiffness of unspecified joint, not elsewhere classified: Secondary | ICD-10-CM | POA: Diagnosis not present

## 2020-06-06 NOTE — Telephone Encounter (Signed)
Pt was originally scheduled for a TOC with you on 04/26/20 but Amelia Jo was canceled due to out of office. Was not rescheduled at the time. I did not know if you wanted to honor the original [facility] and take them on or ask that they stay with the PCP [place] whom the established with in April.   Please advise

## 2020-06-06 NOTE — Telephone Encounter (Signed)
Scheduled in August

## 2020-06-06 NOTE — Telephone Encounter (Signed)
Can r/s his TOC from 04/26/20 with Dr Carlota Raspberry but can be pushed out as he did just see Mrs Dewaine Oats.

## 2020-06-06 NOTE — Patient Instructions (Signed)
Access Code: [email or web address] URL: [email or web address] Date: 06/06/2020 Prepared by: Raeford Razor  Exercises [place] on Table - 2 x daily - 7 x weekly - 1 sets - 5 reps - 30 hold Standing Hip Flexor Stretch - 2 x daily - 7 x weekly - 1 sets - 5 reps - 30 hold Seated Hamstring Stretch - 2 x daily - 7 x weekly - 1 sets - 5 reps - 30 hold Supine Lower Trunk Rotation - 2 x daily - 7 x weekly - 1 sets - 10 reps - 10 hold

## 2020-06-06 NOTE — Therapy (Addendum)
[place], [place], [address] Phone: [phone]   Fax:  [phone]  Physical Therapy Evaluation/Discharge  Patient Details  Name: Alexander [name] MRN: [id number] Date of Birth: 28-Jun-1945 Referring Provider (PT): Sherrie Mustache, NP   Encounter Date: 06/06/2020   PT End of Session - 06/06/20 0829     Visit Number 1    Number of Visits 8    Date for PT Re-Evaluation 08/01/20    Authorization Type [employer] Medicare    PT Start Time 0753    PT Stop Time 0835    PT Time Calculation (min) 42 min    Activity Tolerance Patient tolerated treatment well    Behavior During Therapy [facility] for tasks assessed/performed             Past Medical History:  Diagnosis Date   Angio-edema    CAD (coronary artery disease)    Per [place] New Patient Packet    Carpal tunnel syndrome    Per [facility] New Patient Packet    Diabetes mellitus without complication ([place])    Hypertension    Neuromuscular disorder ([place])    Peripheral arterial disease ([place]) 11/14/2016   non-occlusive, asymptomatic, seen by vascular surgery Dr. Oneida Alar who rec repeated ABIs annually   Prostate cancer [facility])     Past Surgical History:  Procedure Laterality Date   COLON SURGERY     "locked bowel" fixed   COLONOSCOPY  2019   HERNIA REPAIR     LEFT HEART CATHETERIZATION WITH CORONARY ANGIOGRAM N/A 09/15/2013   Procedure: LEFT HEART CATHETERIZATION WITH CORONARY ANGIOGRAM;  Surgeon: Peter M Martinique, MD;  Location: [facility] CATH LAB;  Service: Cardiovascular;  Laterality: N/A;   PROSTATE SURGERY  2005    There were no vitals filed for this visit.    Subjective Assessment - 06/06/20 0756     Subjective Patient presents with chronic back pain and sciatic nerve pain. He had a recent injection that did help. He reports stiffness in back, pain in Rt LE.  He has difficulty with work activities (bending, carry, lifting as a stone mason).  He sometimes wears a back  brace. He was at the point where he considered surgery but changed his mind. He was told he has deteriorating disc and bone spurs in his back.  He has not tried PT before.    Pertinent History chronic back pain , DM,    Limitations Lifting;Standing;Walking;House hold activities    Diagnostic tests 2018 Disc and facet degeneration that is advanced at L4-5 and L5-S1.  There was compressive spinal and foraminal stenosis at L4-5 on a  2014 MRI.    Patient Stated Goals Patient would like to keep from having surgery.    Currently in Pain? No/denies    Pain Score --   after working 10/10   Pain Location Leg    Pain Orientation Right;Anterior    Pain Descriptors / Indicators Aching;Tightness    Pain Type Chronic pain    Pain Radiating Towards Rt LE to foot.  Sometimes L calf    Pain Onset More than a month ago    Pain Frequency Intermittent    Aggravating Factors  bending, arching, lifting    Pain Relieving Factors sitting down, meds, relaxing, heating pad    Effect of Pain on Daily Activities hard to work , is very used to dealing with this pain    Multiple Pain Sites No  [facility] PT Assessment - 06/06/20 0001       Assessment   Medical Diagnosis lumbar radiculopathy    Referring Provider (PT) Abbey Chatters, NP    Onset Date/Surgical Date --   chronic   Prior Therapy pelvic floor      Precautions   Precautions None      Restrictions   Weight Bearing Restrictions No      Balance Screen   Has the patient fallen in the past 6 months No      Home Environment   Living Environment Private residence    Living Arrangements Children;Other relatives      Prior Function   Level of Independence Independent    Vocation Part time employment    Customer service manager , 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    Leisure rest, lives with daughter and granddaughter      Cognition   Overall Cognitive Status Within Functional Limits for tasks assessed      Observation/Other  Assessments   Focus on Therapeutic Outcomes (FOTO)  47%      Sensation   Light Touch Appears Intact      Coordination   Gross Motor Movements are Fluid and Coordinated Not tested      Functional Tests   Functional tests Squat;Single leg stance      Squat   Comments decent form, no pain, lumbar flexion      Single Leg Stance   Comments WFL      Posture/Postural Control   Posture/Postural Control Postural limitations    Postural Limitations Rounded Shoulders;Forward head;Decreased lumbar lordosis    Posture Comments trunk flexed, decreased hip extension      AROM   Lumbar Flexion fingertips to distal shin no pain    Lumbar Extension limited 80% with some tightness    Lumbar - Right Rotation 50% no pain    Lumbar - Left Rotation 50% no pain      PROM   Overall PROM Comments hip ER/IR limited bilateral      Strength   Overall Strength Comments WNL in sitting    Right Hip Extension 4-/5    Right Hip ABduction 4/5    Left Hip Extension 4-/5    Left Hip ABduction 5/5      Flexibility   Hamstrings tight    Quadriceps tight    Piriformis tight      Palpation   Spinal mobility stiffness throughout thoracic and lumbar    Palpation comment no pain elicited with palpation to paraspinals , some discomfort with central palpation L4-L5      Special Tests   Other special tests neg SLR      Transfers   Comments does with ease, no UE needed              Objective measurements completed on examination: See above findings.               PT Education - 06/06/20 0857     Education Details PT/POC, flexibility, HEP    Person(s) Educated Patient    Methods Explanation;Handout;Demonstration    Comprehension Verbalized understanding;Returned demonstration              PT Short Term Goals - 06/06/20 0093       PT SHORT TERM GOAL #1   Title independent with initial HEP for hip /trunk lexibility    Baseline given on eval    Time 4    Period Weeks  Status New      PT SHORT TERM GOAL #2   Title Pt will be able to report more awareness of posture with ADLs    Time 4    Period Weeks    Status New    Target Date 07/04/20      PT SHORT TERM GOAL #3   Title FOTO score will be interpreted for patient    Baseline 47%    Time 2    Period Weeks    Status New    Target Date 06/20/20               PT Long Term Goals - 06/06/20 0834       PT LONG TERM GOAL #1   Title independent with HEP and understand how to advance    Time 8    Period Weeks    Status New    Target Date 08/01/20      PT LONG TERM GOAL #2   Title patient will be able to safely squat and lift 25 lbs or more from the floor in order to understand and perform work tasks    Time 8    Period Weeks    Status New    Target Date 08/01/20      PT LONG TERM GOAL #3   Title Pt will be able to report pain in back at the end of the day 5/10 or less    Baseline sometimes 10/10    Time 8    Period Weeks    Status New    Target Date 08/01/20      PT LONG TERM GOAL #4   Title FOTO score will improve to 66% to demo improved functional mobility    Time 8    Period Weeks    Status New    Target Date 08/01/20                    Plan - 06/06/20 0830     Clinical Impression Statement Patient is a 75 year old male with long history of low back pain and radiculopathy, presenting for low complexity evaluation.  Today he had no pain other than Rt thigh soreness from MD visit.  He shows decent strength but significantly tight anterior hip, quads.  This postural abnormality stressed his spine when performing work activites such as lifting, carrying and bending. He will benefit from skilled PT to see if improvement in hip and trunk flexibility may impact his functional mobility.  Due to long history of this issue, he may have limited outcomes.    Personal Factors and Comorbidities Age;Behavior Pattern;Comorbidity 3+;Time since onset of  injury/illness/exacerbation;Past/Current Experience;Profession    Comorbidities smoking, prostate cancer history, Diabetes , carpal tunnel syndrome    Examination-Activity Limitations Lift;Stand;Locomotion Level;Bend;Reach Overhead;Carry;Squat    Examination-Participation Restrictions Community Activity;Occupation    Stability/Clinical Decision Making Stable/Uncomplicated    Clinical Decision Making Low    Rehab Potential Good    PT Frequency 1x / week    PT Duration 8 weeks    PT Treatment/Interventions ADLs/Self Care Home Management;Passive range of motion;Moist Heat;Functional mobility training;Manual techniques;Therapeutic exercise;Cryotherapy;Patient/family education;Therapeutic activities    PT Next Visit Plan check HEP, begin body mechanics, squat and lift    PT Home Exercise Plan Access Code: [email or web address]    Consulted and Agree with Plan of Care Patient             Patient will benefit from skilled therapeutic intervention in order  to improve the following deficits and impairments:  Increased fascial restricitons,Pain,Improper body mechanics,Postural dysfunction,Decreased mobility,Decreased range of motion,Decreased strength,Hypomobility,Impaired flexibility  Visit Diagnosis: Joint stiffness of spine  Stiffness of left hip, not elsewhere classified  Stiffness of right hip, not elsewhere classified  Abnormal posture     Problem List Patient Active Problem List   Diagnosis Date Noted   Right carpal tunnel syndrome 06/08/2019   Malignant neoplasm of prostate ([place]) 10/27/2018   Allergic reaction 08/10/2018   Angioedema 08/10/2018   Peripheral arterial occlusive disease ([place]) 10/28/2016   Hyperlipidemia LDL goal <70 05/01/2015   Tobacco use disorder 12/09/2013   Coronary artery disease involving native coronary artery of native heart without angina pectoris 12/09/2013   Erectile dysfunction due to diseases classified elsewhere 12/09/2013   Type 2 diabetes mellitus with  other circulatory complications ([place]) 08/65/7846   Left lumbar radiculopathy 09/30/2012   Chronic rhinitis 04/08/2011   Hypertension    HTN (hypertension), benign 03/09/2011   History of prostate cancer 03/09/2011    Alexander [name] 06/06/2020, 9:15 AM  [facility] [address] [place], [place], [address] Phone: [phone]   Fax:  [phone]  Name: Alexander [name] MRN: [id number] Date of Birth: 08-21-1945  Raeford Razor, PT 06/06/20 9:16 AM Phone: [phone] Fax: [phone]  DID NOT RETURN FOR PT due to financial reasons. Raeford Razor, PT 07/03/20 9:56 AM Phone: [phone] Fax: [phone]

## 2020-06-06 NOTE — Telephone Encounter (Signed)
I can see him for [facility] visit - can schedule when open appt as just saw Ms. Dewaine Oats recently.

## 2020-06-08 ENCOUNTER — Encounter: Payer: Self-pay | Admitting: Nurse Practitioner

## 2020-06-08 DIAGNOSIS — E1159 Type 2 diabetes mellitus with other circulatory complications: Secondary | ICD-10-CM

## 2020-06-08 MED ORDER — METFORMIN HCL 1000 MG PO TABS: 1000.0000 mg | ORAL_TABLET | Freq: Two times a day (BID) | ORAL | 1 refills | Status: DC

## 2020-06-15 ENCOUNTER — Ambulatory Visit: Payer: Medicare Other

## 2020-06-21 ENCOUNTER — Ambulatory Visit: Payer: Medicare Other

## 2020-06-22 ENCOUNTER — Other Ambulatory Visit: Payer: Self-pay

## 2020-06-22 ENCOUNTER — Telehealth: Payer: Self-pay

## 2020-06-22 ENCOUNTER — Ambulatory Visit (INDEPENDENT_AMBULATORY_CARE_PROVIDER_SITE_OTHER): Payer: Medicare Other | Admitting: Nurse Practitioner

## 2020-06-22 ENCOUNTER — Encounter: Payer: Self-pay | Admitting: Nurse Practitioner

## 2020-06-22 DIAGNOSIS — Z0000 Encounter for general adult medical examination without abnormal findings: Secondary | ICD-10-CM | POA: Diagnosis not present

## 2020-06-22 NOTE — Progress Notes (Signed)
Subjective:   Alexander [name] is a 75 y.o. male who presents for Medicare Annual/Subsequent preventive examination.  Review of Systems     Cardiac Risk Factors include: hypertension;diabetes mellitus;smoking/ tobacco exposure;advanced age (>37mn, >>17women);male gender     Objective:    There were no vitals filed for this visit. There is no height or weight on file to calculate BMI.  Advanced Directives 06/22/2020 06/06/2020 05/10/2020 08/07/2019 11/11/2018 10/27/2018 10/20/2018  Does Patient Have a Medical Advance Directive? No Yes No No - No Yes  Type of Advance Directive - [place] Does patient want to make changes to medical advance directive? No - Patient declined No - Patient declined - - - Yes (MAU/Ambulatory/Procedural Areas - Information given) No - Patient declined  Copy of HWyndmerein Chart? - - - - - - No - copy requested  Would patient like information on creating a medical advance directive? - - Yes (MAU/Ambulatory/Procedural Areas - Information given) - No - Patient declined;Yes (MAU/Ambulatory/Procedural Areas - Information given) Yes (MAU/Ambulatory/Procedural Areas - Information given) -    Current Medications (verified) Outpatient Encounter Medications as of 06/22/2020  Medication Sig  . amLODipine (NORVASC) 10 MG tablet Take 1 tablet (10 mg total) by mouth daily.  .Marland Kitchenaspirin EC 81 MG tablet Take 81 mg by mouth daily as needed for mild pain.   . Aspirin-Salicylamide-Caffeine (BC HEADACHE POWDER PO) Take 1 Package by mouth daily as needed (for back pain).  . DULoxetine (CYMBALTA) 30 MG capsule Take 1 capsule (30 mg total) by mouth daily.  .Marland KitchenEPINEPHrine (EPIPEN 2-PAK) 0.3 mg/0.3 mL IJ SOAJ injection Inject 0.3 mLs (0.3 mg total) into the muscle as needed for anaphylaxis.  . fexofenadine (ALLEGRA) 180 MG tablet Take 1 tablet (180 mg total) by mouth daily.  . fluticasone (FLONASE) 50  MCG/ACT nasal spray Place 2 sprays into both nostrils daily.  .Marland Kitchengabapentin (NEURONTIN) 300 MG capsule TAKE 3 CAPSULES BY MOUTH  TWICE DAILY  . glipiZIDE (GLUCOTROL) 5 MG tablet TAKE 1 TABLET BY MOUTH  TWICE DAILY BEFORE A MEAL  . HYDROcodone-acetaminophen (NORCO) 10-325 MG tablet TAKE 1 TABLET BY MOUTH EVERY 6 (SIX) HOURS AS NEEDED FOR MODERATE PAIN.  . metFORMIN (GLUCOPHAGE) 1000 MG tablet Take 1 tablet (1,000 mg total) by mouth 2 (two) times daily with a meal.  . montelukast (SINGULAIR) 10 MG tablet Take 1 tablet (10 mg total) by mouth daily.  . sildenafil (VIAGRA) 100 MG tablet Take 100 mg by mouth as needed for erectile dysfunction.   . triamcinolone cream (KENALOG) 0.1 % APPLY TOPICALLY TWICE DAILY  . triamterene-hydrochlorothiazide (MAXZIDE-25) 37.5-25 MG tablet Take 1 tablet by mouth daily.  . Na Sulfate-K Sulfate-Mg Sulf (SUPREP BOWEL PREP KIT) 17.5-3.13-1.6 GM/177ML SOLN Take as directed   No facility-administered encounter medications on file as of 06/22/2020.    Allergies (verified) Lisinopril   History: Past Medical History:  Diagnosis Date  . Angio-edema   . CAD (coronary artery disease)    Per [facility]   . Car[facility] tunnel syndrome    [facility]   . [place]   . Hypertension   . [place]romuscular disorder ([place]   . Peripheral arterial disease ([place] 11[place]   non-occlusive, asymptomatic, seen by vascular surgery Dr. FOneida Alarwho rec repeated ABIs annually  . Prostate cancer ([facility]    Past Surgical History:  Procedure  Laterality Date  . COLON SURGERY     "locked bowel" fixed  . COLONOSCOPY  2019  . HERNIA REPAIR    . LEFT HEART CATHETERIZATION WITH CORONARY ANGIOGRAM N/A 09/15/2013   Procedure: LEFT HEART CATHETERIZATION WITH CORONARY ANGIOGRAM;  Surgeon: Peter M Martinique, MD;  Location: [facility] CATH LAB;  Service: Cardiovascular;  Laterality: N/A;  . PROSTATE SURGERY  2005   Family History  Problem Relation Age  of Onset  . Diabetes Mother   . Depression Father   . Hypertension Father   . Diabetes Father   . Thyroid disease Father   . Hypertension Sister   . Diabetes Sister   . Congestive Heart Failure Sister   . Lung cancer Brother   . Kidney failure Sister   . Diabetes Sister   . Breast cancer Neg Hx   . Prostate cancer Neg Hx   . Colon cancer Neg Hx    Social History   Socioeconomic History  . Marital status: Widowed    Spouse name: Not on file  . Number of children: 2  . Years of education: 9  . Highest education level: Not on file  Occupational History  . Occupation: retired  Tobacco Use  . Smoking status: Current Every Day Smoker    Packs/day: 1.00    Years: 55.00    Pack years: 55.00    Types: Cigarettes  . Smokeless tobacco: Never Used  Vaping Use  . Vaping Use: Never used  Substance and Sexual Activity  . Alcohol use: No  . Drug use: No  . Sexual activity: Yes  Other Topics Concern  . Not on file  Social History Narrative   Patient is single and his daughter lives with him.   Patient has two children.   Patient has a 9th grade education.   Patient works in Architect (part-time).   Patient drinks one to two cups of coffee daily.   Patient is right-handed.         Per [facility] New Patient Packet:   Diet:Left blank      Caffeine:Yes      Married, if yes what year: Widowed      Do you live in a house, apartment, assisted living, condo, trailer, ect: House, 3 persons      Is it one or more stories: One      Pets: No      Current/Past profession: Interior and spatial designer      Highest level or education completed:       Exercise:         No         Type and how often:          Living Will: No   DNR: No   POA/HPOA: No      Functional Status:   Do you have difficulty bathing or dressing yourself? No   Do you have difficulty preparing food or eating? No   Do you have difficulty managing your medications? No   Do you have difficulty managing your finances? No   Do  you have difficulty affording your medications? No   Social Determinants of Radio broadcast assistant Strain: Not on file  Food Insecurity: Not on file  Transportation Needs: Not on file  Physical Activity: Not on file  Stress: Not on file  Social Connections: Not on file    Tobacco Counseling Ready to quit: Not Answered Counseling given: Not Answered   Clinical Intake:     Pain :  No/denies pain     BMI - recorded: 22.47 Nutritional Status: BMI of 19-24  Normal Nutritional Risks: None Diabetes: Yes  How often do you need to have someone help you when you read instructions, pamphlets, or other written materials from your doctor or pharmacy?: 1 - Never  Diabetic?yes         Activities of Daily Living In your present state of health, do you have any difficulty performing the following activities: 06/22/2020  Hearing? N  Vision? N  Difficulty concentrating or making decisions? N  Walking or climbing stairs? N  Dressing or bathing? N  Doing errands, shopping? N  Preparing Food and eating ? N  Using the Toilet? N  In the past six months, have you accidently leaked urine? Y  Do you have problems with loss of bowel control? N  Managing your Medications? N  Managing your Finances? N  Comment daughter helps  Housekeeping or managing your Housekeeping? N  Some recent data might be hidden    Patient Care Team: Lauree Chandler, NP as PCP - General (Geriatric Medicine) Carol Ada, MD as Consulting Physician (Gastroenterology) Warden Fillers, MD as Consulting Physician (Ophthalmology) Pamala Hurry, MD as Referring Physician (Urology) Cira Rue, RN Nurse Navigator as Registered Nurse (Medical Oncology) Jola Schmidt, MD as Consulting Physician (Ophthalmology) Marlaine Hind, MD as Consulting Physician (Physical Medicine and Rehabilitation) Laurin Coder, MD as Consulting Physician (Pulmonary Disease)  Indicate any recent Medical Services you  may have received from other than [facility] providers in the past year (date may be approximate).     Assessment:   This is a routine wellness examination for Alexander [name].  Hearing/Vision screen  Hearing Screening   125Hz  250Hz  500Hz  1000Hz  2000Hz  3000Hz  4000Hz  6000Hz  8000Hz   Right ear:           Left ear:           Comments: No hearing issues   Vision Screening Comments: No eye exam within the last year, patient has an eye doctor, Dr.Bowen.   Dietary issues and exercise activities discussed: Current Exercise Habits: The patient does not participate in regular exercise at present  Goals Addressed            This Visit's Progress   . Quit smoking / using tobacco   Not on track    Patient states that he would like to cut back on smoking      Depression Screen PHQ 2/9 Scores 06/22/2020 05/10/2020 04/27/2019 03/18/2019 01/01/2019 11/11/2018 11/02/2018  PHQ - 2 Score 0 0 0 0 0 0 0    Fall Risk Fall Risk  06/22/2020 05/10/2020 04/27/2019 03/18/2019 01/01/2019  Falls in the past year? 0 0 0 0 0  Number falls in past yr: 0 0 0 0 0  Injury with Fall? 0 0 0 0 0  Follow up - - - Falls evaluation completed -    FALL RISK PREVENTION PERTAINING TO THE HOME:  Any stairs in or around the home? Yes  If so, are there any without handrails? No  Home free of loose throw rugs in walkways, pet beds, electrical cords, etc? Yes  Adequate lighting in your home to reduce risk of falls? Yes   ASSISTIVE DEVICES UTILIZED TO PREVENT FALLS:  Life alert? No  Use of a cane, walker or w/c? No  Grab bars in the bathroom? No  Shower chair or bench in shower? No  Elevated toilet seat or a handicapped toilet? No   TIMED  UP AND GO:  Was the test performed? No .   Cognitive Function:     6CIT Screen 06/22/2020 11/11/2018 10/28/2016  What Year? 0 points 0 points 0 points  What month? 0 points 0 points 0 points  What time? 0 points 0 points 0 points  Count back from 20 0 points 0 points 0 points  Months in reverse  4 points 0 points 4 points  Repeat phrase 2 points 0 points 2 points  Total Score 6 0 6    Immunizations Immunization History  Administered Date(s) Administered  . PFIZER(Purple Top)SARS-COV-2 Vaccination 03/03/2019, 03/24/2019, 10/26/2019  . Td 07/17/2014    TDAP status: Up to date  Flu Vaccine status: Declined, Education has been provided regarding the importance of this vaccine but patient still declined. Advised may receive this vaccine at local pharmacy or Health Dept. Aware to provide a copy of the vaccination record if obtained from local pharmacy or Health Dept. Verbalized acceptance and understanding.  Pneumococcal vaccine status: Declined,  Education has been provided regarding the importance of this vaccine but patient still declined. Advised may receive this vaccine at local pharmacy or Health Dept. Aware to provide a copy of the vaccination record if obtained from local pharmacy or Health Dept. Verbalized acceptance and understanding.   Covid-19 vaccine status: Completed vaccines  Qualifies for Shingles Vaccine? No   Zostavax completed No   Shingrix Completed?: No.    Education has been provided regarding the importance of this vaccine. Patient has been advised to call insurance company to determine out of pocket expense if they have not yet received this vaccine. Advised may also receive vaccine at local pharmacy or Health Dept. Verbalized acceptance and understanding.  Screening Tests Health Maintenance  Topic Date Due  . Zoster Vaccines- Shingrix (1 of 2) Never done  . PNA vac Low Risk Adult (1 of 2 - PCV13) Never done  . OPHTHALMOLOGY EXAM  11/21/2018  . FOOT EXAM  12/17/2018  . COVID-19 Vaccine (4 - Booster for [place] series) 01/26/2020  . COLONOSCOPY (Pts 45-35yr Insurance coverage will need to be confirmed)  05/08/2020  . INFLUENZA VACCINE  08/21/2020  . URINE MICROALBUMIN  10/25/2020  . HEMOGLOBIN A1C  11/09/2020  . TETANUS/TDAP  07/16/2024  . Hepatitis C  Screening  Completed  . HPV VACCINES  Aged Out    Health Maintenance  Health Maintenance Due  Topic Date Due  . Zoster Vaccines- Shingrix (1 of 2) Never done  . PNA vac Low Risk Adult (1 of 2 - PCV13) Never done  . OPHTHALMOLOGY EXAM  11/21/2018  . FOOT EXAM  12/17/2018  . COVID-19 Vaccine (4 - Booster for PToddseries) 01/26/2020  . COLONOSCOPY (Pts 45-482yrInsurance coverage will need to be confirmed)  05/08/2020    Colorectal cancer screening: Type of screening: Colonoscopy. Completed 2022. Repeat every 3 years  Lung Cancer Screening: (Low Dose CT Chest recommended if Age 74-80ears, 30 pack-year currently smoking OR have quit w/in 15years.) does qualify.   Lung Cancer Screening Referral: in place  Additional Screening:  Hepatitis C Screening: does qualify; Completed   Vision Screening: Recommended annual ophthalmology exams for early detection of glaucoma and other disorders of the eye. Is the patient up to date with their annual eye exam?  No  Who is the provider or what is the name of the office in which the patient attends annual eye exams? Had provier unsure of name If pt is not established with a provider, would  they like to be referred to a provider to establish care? No .   Dental Screening: Recommended annual dental exams for proper oral hygiene  Community Resource Referral / Chronic Care Management: CRR required this visit?  No   CCM required this visit?  No      Plan:     I have personally reviewed and noted the following in the patient's chart:   . Medical and social history . Use of alcohol, tobacco or illicit drugs  . Current medications and supplements including opioid prescriptions. Patient is not currently taking opioid prescriptions. . Functional ability and status . Nutritional status . Physical activity . Advanced directives . List of other physicians . Hospitalizations, surgeries, and ER visits in previous 12  months . Vitals . Screenings to include cognitive, depression, and falls . Referrals and appointments  In addition, I have reviewed and discussed with patient certain preventive protocols, quality metrics, and best practice recommendations. A written personalized care plan for preventive services as well as general preventive health recommendations were provided to patient.     Lauree Chandler, NP   06/22/2020    Virtual Visit via Telephone Note  I connected with@ on 06/22/20 at  8:30 AM EDT by telephone and verified that I am speaking with the correct person using two identifiers.  Location: Patient: home Provider: [facility]   I discussed the limitations, risks, security and privacy concerns of performing an evaluation and management service by telephone and the availability of in person appointments. I also discussed with the patient that there may be a patient responsible charge related to this service. The patient expressed understanding and agreed to proceed.   I discussed the assessment and treatment plan with the patient. The patient was provided an opportunity to ask questions and all were answered. The patient agreed with the plan and demonstrated an understanding of the instructions.   The patient was advised to call back or seek an in-person evaluation if the symptoms worsen or if the condition fails to improve as anticipated.  I provided 15 minutes of non-face-to-face time during this encounter.  Alexander [name]. Harle Battiest Avs printed and mailed

## 2020-06-22 NOTE — Telephone Encounter (Signed)
Mr. Alexander [name], Alexander [name] are scheduled for a virtual visit with your provider today.    Just as we do with appointments in the office, we must obtain your consent to participate.  Your consent will be active for this visit and any virtual visit you may have with one of our providers in the next 365 days.    If you have a MyChart account, I can also send a copy of this consent to you electronically.  All virtual visits are billed to your insurance company just like a traditional visit in the office.  As this is a virtual visit, video technology does not allow for your provider to perform a traditional examination.  This may limit your provider's ability to fully assess your condition.  If your provider identifies any concerns that need to be evaluated in person or the need to arrange testing such as labs, EKG, etc, we will make arrangements to do so.    Although advances in technology are sophisticated, we cannot ensure that it will always work on either your end or our end.  If the connection with a video visit is poor, we may have to switch to a telephone visit.  With either a video or telephone visit, we are not always able to ensure that we have a secure connection.   I need to obtain your verbal consent now.   Are you willing to proceed with your visit today?   Alexander [name] has provided verbal consent on 06/22/2020 for a virtual visit (video or telephone).   Leigh Aurora [place], [place] 06/22/2020  8:31 AM

## 2020-06-22 NOTE — Progress Notes (Signed)
   This service is provided via telemedicine  No vital signs collected/recorded due to the encounter was a telemedicine visit.   Location of patient (ex: home, work):  Home  Patient consents to a telephone visit: Yes, see telephone visit dated 06/22/20  Location of the provider (ex: office, home):  [facility], Office   Name of any referring provider:  N/A  Names of all persons participating in the telemedicine service and their role in the encounter:  S.Chrae B/CMA, Sherrie Mustache, NP, Aniceto Boss (daughter), and Patient   Time spent on call:  9 min with medical assistant

## 2020-06-22 NOTE — Patient Instructions (Signed)
Alexander [name] , Thank you for taking time to come for your Medicare Wellness Visit. I appreciate your ongoing commitment to your health goals. Please review the following plan we discussed and let me know if I can assist you in the future.   Screening recommendations/referrals: Colonoscopy up to date- will need these records Recommended yearly ophthalmology/optometry visit for glaucoma screening and checkup Recommended yearly dental visit for hygiene and checkup  Vaccinations: Influenza vaccine declines vaccines Pneumococcal vaccine -declines vaccine Tdap vaccine up to date  Shingles vaccine-n/a    Advanced directives: to bring copy to place on file   Conditions/risks identified: advanced age, smoker, complications associated with diabetes  Next appointment: 1 year   Preventive Care 28 Years and Older, Male Preventive care refers to lifestyle choices and visits with your health care provider that can promote health and wellness. What does preventive care include?  A yearly physical exam. This is also called an annual well check.  Dental exams once or twice a year.  Routine eye exams. Ask your health care provider how often you should have your eyes checked.  Personal lifestyle choices, including:  Daily care of your teeth and gums.  Regular physical activity.  Eating a healthy diet.  Avoiding tobacco and drug use.  Limiting alcohol use.  Practicing safe sex.  Taking low doses of aspirin every day.  Taking vitamin and mineral supplements as recommended by your health care provider. What happens during an annual well check? The services and screenings done by your health care provider during your annual well check will depend on your age, overall health, lifestyle risk factors, and family history of disease. Counseling  Your health care provider may ask you questions about your:  Alcohol use.  Tobacco use.  Drug use.  Emotional well-being.  Home and  relationship well-being.  Sexual activity.  Eating habits.  History of falls.  Memory and ability to understand (cognition).  Work and work Statistician. Screening  You may have the following tests or measurements:  Height, weight, and BMI.  Blood pressure.  Lipid and cholesterol levels. These may be checked every 5 years, or more frequently if you are over 18 years old.  Skin check.  Lung cancer screening. You may have this screening every year starting at age 43 if you have a 30-pack-year history of smoking and currently smoke or have quit within the past 15 years.  Fecal occult blood test (FOBT) of the stool. You may have this test every year starting at age 60.  Flexible sigmoidoscopy or colonoscopy. You may have a sigmoidoscopy every 5 years or a colonoscopy every 10 years starting at age 27.  Prostate cancer screening. Recommendations will vary depending on your family history and other risks.  Hepatitis C blood test.  Hepatitis B blood test.  Sexually transmitted disease (STD) testing.  Diabetes screening. This is done by checking your blood sugar (glucose) after you have not eaten for a while (fasting). You may have this done every 1-3 years.  Abdominal aortic aneurysm (AAA) screening. You may need this if you are a current or former smoker.  Osteoporosis. You may be screened starting at age 3 if you are at high risk. Talk with your health care provider about your test results, treatment options, and if necessary, the need for more tests. Vaccines  Your health care provider may recommend certain vaccines, such as:  Influenza vaccine. This is recommended every year.  Tetanus, diphtheria, and acellular pertussis (Tdap, Td) vaccine. You may  need a Td booster every 10 years.  Zoster vaccine. You may need this after age 65.  Pneumococcal 13-valent conjugate (PCV13) vaccine. One dose is recommended after age 44.  Pneumococcal polysaccharide (PPSV23) vaccine. One  dose is recommended after age 36. Talk to your health care provider about which screenings and vaccines you need and how often you need them. This information is not intended to replace advice given to you by your health care provider. Make sure you discuss any questions you have with your health care provider. Document Released: 02/03/2015 Document Revised: 09/27/2015 Document Reviewed: 11/08/2014 Elsevier Interactive Patient Education  2017 [place] Prevention in the Home Falls can cause injuries. They can happen to people of all ages. There are many things you can do to make your home safe and to help prevent falls. What can I do on the outside of my home?  Regularly fix the edges of walkways and driveways and fix any cracks.  Remove anything that might make you trip as you walk through a door, such as a raised step or threshold.  Trim any bushes or trees on the path to your home.  Use bright outdoor lighting.  Clear any walking paths of anything that might make someone trip, such as rocks or tools.  Regularly check to see if handrails are loose or broken. Make sure that both sides of any steps have handrails.  Any raised decks and porches should have guardrails on the edges.  Have any leaves, snow, or ice cleared regularly.  Use sand or salt on walking paths during winter.  Clean up any spills in your garage right away. This includes oil or grease spills. What can I do in the bathroom?  Use night lights.  Install grab bars by the toilet and in the tub and shower. Do not use towel bars as grab bars.  Use non-skid mats or decals in the tub or shower.  If you need to sit down in the shower, use a plastic, non-slip stool.  Keep the floor dry. Clean up any water that spills on the floor as soon as it happens.  Remove soap buildup in the tub or shower regularly.  Attach bath mats securely with double-sided non-slip rug tape.  Do not have throw rugs and other  things on the floor that can make you trip. What can I do in the bedroom?  Use night lights.  Make sure that you have a light by your bed that is easy to reach.  Do not use any sheets or blankets that are too big for your bed. They should not hang down onto the floor.  Have a firm chair that has side arms. You can use this for support while you get dressed.  Do not have throw rugs and other things on the floor that can make you trip. What can I do in the kitchen?  Clean up any spills right away.  Avoid walking on wet floors.  Keep items that you use a lot in easy-to-reach places.  If you need to reach something above you, use a strong step stool that has a grab bar.  Keep electrical cords out of the way.  Do not use floor polish or wax that makes floors slippery. If you must use wax, use non-skid floor wax.  Do not have throw rugs and other things on the floor that can make you trip. What can I do with my stairs?  Do not leave any items on  the stairs.  Make sure that there are handrails on both sides of the stairs and use them. Fix handrails that are broken or loose. Make sure that handrails are as long as the stairways.  Check any carpeting to make sure that it is firmly attached to the stairs. Fix any carpet that is loose or worn.  Avoid having throw rugs at the top or bottom of the stairs. If you do have throw rugs, attach them to the floor with carpet tape.  Make sure that you have a light switch at the top of the stairs and the bottom of the stairs. If you do not have them, ask someone to add them for you. What else can I do to help prevent falls?  Wear shoes that:  Do not have high heels.  Have rubber bottoms.  Are comfortable and fit you well.  Are closed at the toe. Do not wear sandals.  If you use a stepladder:  Make sure that it is fully opened. Do not climb a closed stepladder.  Make sure that both sides of the stepladder are locked into place.  Ask  someone to hold it for you, if possible.  Clearly mark and make sure that you can see:  Any grab bars or handrails.  First and last steps.  Where the edge of each step is.  Use tools that help you move around (mobility aids) if they are needed. These include:  Canes.  Walkers.  Scooters.  Crutches.  Turn on the lights when you go into a dark area. Replace any light bulbs as soon as they burn out.  Set up your furniture so you have a clear path. Avoid moving your furniture around.  If any of your floors are uneven, fix them.  If there are any pets around you, be aware of where they are.  Review your medicines with your doctor. Some medicines can make you feel dizzy. This can increase your chance of falling. Ask your doctor what other things that you can do to help prevent falls. This information is not intended to replace advice given to you by your health care provider. Make sure you discuss any questions you have with your health care provider. Document Released: 11/03/2008 Document Revised: 06/15/2015 Document Reviewed: 02/11/2014 Elsevier Interactive Patient Education  2017 [employer].

## 2020-07-06 ENCOUNTER — Encounter: Payer: Medicare Other | Admitting: Physical Therapy

## 2020-07-11 ENCOUNTER — Encounter: Payer: Medicare Other | Admitting: Physical Therapy

## 2020-07-18 ENCOUNTER — Encounter: Payer: Medicare Other | Admitting: Physical Therapy

## 2020-07-18 ENCOUNTER — Encounter: Payer: Self-pay | Admitting: Nurse Practitioner

## 2020-07-18 LAB — HM DIABETES EYE EXAM

## 2020-08-15 ENCOUNTER — Other Ambulatory Visit (HOSPITAL_COMMUNITY): Payer: Self-pay

## 2020-08-15 MED ORDER — KETOROLAC TROMETHAMINE 0.5 % OP SOLN
1.0000 [drp] | Freq: Four times a day (QID) | OPHTHALMIC | 0 refills | Status: DC
  Filled 2020-08-15: qty 5, 25d supply, fill #0

## 2020-08-15 MED ORDER — MOXIFLOXACIN HCL 0.5 % OP SOLN
1.0000 [drp] | Freq: Four times a day (QID) | OPHTHALMIC | 0 refills | Status: DC
  Filled 2020-08-15: qty 3, 20d supply, fill #0

## 2020-08-15 MED ORDER — PREDNISOLONE ACETATE 1 % OP SUSP
1.0000 [drp] | Freq: Four times a day (QID) | OPHTHALMIC | 0 refills | Status: DC
  Filled 2020-08-15: qty 5, 25d supply, fill #0

## 2020-08-17 ENCOUNTER — Other Ambulatory Visit (HOSPITAL_COMMUNITY): Payer: Self-pay

## 2020-08-21 ENCOUNTER — Ambulatory Visit: Payer: Medicare Other | Admitting: Nurse Practitioner

## 2020-08-23 HISTORY — PX: CATARACT EXTRACTION: SUR2

## 2020-09-06 ENCOUNTER — Ambulatory Visit (INDEPENDENT_AMBULATORY_CARE_PROVIDER_SITE_OTHER)
Admission: RE | Admit: 2020-09-06 | Discharge: 2020-09-06 | Disposition: A | Discharge status: Home / Self Care | Payer: Medicare Other | Source: Ambulatory Visit | Attending: Internal Medicine | Admitting: Internal Medicine

## 2020-09-06 ENCOUNTER — Other Ambulatory Visit: Payer: Self-pay

## 2020-09-06 DIAGNOSIS — F17210 Nicotine dependence, cigarettes, uncomplicated: Secondary | ICD-10-CM

## 2020-09-06 DIAGNOSIS — Z87891 Personal history of nicotine dependence: Secondary | ICD-10-CM

## 2020-09-08 DIAGNOSIS — Z9229 Personal history of other drug therapy: Secondary | ICD-10-CM

## 2020-09-08 NOTE — Progress Notes (Signed)
Alexander [name] [facility])                                            [facility] Team                                        Statin Quality Measure Assessment    09/08/2020  Alexander [name] [facility] 1945-06-26 [id number]  Per review of chart and payor information, this patient has been flagged for non-adherence to the following CMS Quality Measure:   '[x]'$  Statin Use in Persons with Diabetes  '[x]'$  Statin Use in Persons with Cardiovascular Disease  The ASCVD Risk score Mikey Bussing DC Jr., et al., 2013) failed to calculate for the following reasons:   The valid total cholesterol range is 130 to 320 mg/dL LDL 44 mg/dL (05/10/2020)  Currently prescribed statin:  '[]'$  Yes '[x]'$  No     Comments: Patient was previously on atorvastatin but has not been filled since October 2021. Per chart review, this patient previously reported statin caused leg pain.   Please consider the following recommendations:  If clinically appropriate, please consider associating T46.6X5A (adverse effect of antihyperlipidemic and anti-arteriosclerotic drugs, initial encounter) for reported leg pain at the next OV on 09/11/2020.    Thank you for your time,  Kristeen Miss, [place] Cell: [phone]

## 2020-09-11 ENCOUNTER — Ambulatory Visit (INDEPENDENT_AMBULATORY_CARE_PROVIDER_SITE_OTHER): Payer: Medicare Other | Admitting: Family Medicine

## 2020-09-11 ENCOUNTER — Other Ambulatory Visit (HOSPITAL_COMMUNITY): Payer: Self-pay

## 2020-09-11 ENCOUNTER — Other Ambulatory Visit: Payer: Self-pay

## 2020-09-11 ENCOUNTER — Encounter: Payer: Self-pay | Admitting: Family Medicine

## 2020-09-11 VITALS — BP 138/80 | HR 82 | Temp 98.1°F | Resp 17 | Wt 130.2 lb

## 2020-09-11 DIAGNOSIS — E785 Hyperlipidemia, unspecified: Secondary | ICD-10-CM

## 2020-09-11 DIAGNOSIS — I10 Essential (primary) hypertension: Secondary | ICD-10-CM

## 2020-09-11 DIAGNOSIS — H9193 Unspecified hearing loss, bilateral: Secondary | ICD-10-CM

## 2020-09-11 DIAGNOSIS — F17210 Nicotine dependence, cigarettes, uncomplicated: Secondary | ICD-10-CM

## 2020-09-11 DIAGNOSIS — E1159 Type 2 diabetes mellitus with other circulatory complications: Secondary | ICD-10-CM

## 2020-09-11 DIAGNOSIS — J310 Chronic rhinitis: Secondary | ICD-10-CM | POA: Diagnosis not present

## 2020-09-11 DIAGNOSIS — M5416 Radiculopathy, lumbar region: Secondary | ICD-10-CM | POA: Diagnosis not present

## 2020-09-11 DIAGNOSIS — Z8546 Personal history of malignant neoplasm of prostate: Secondary | ICD-10-CM

## 2020-09-11 DIAGNOSIS — L309 Dermatitis, unspecified: Secondary | ICD-10-CM

## 2020-09-11 MED ORDER — HYDROCODONE-ACETAMINOPHEN 5-325 MG PO TABS
1.0000 | ORAL_TABLET | Freq: Four times a day (QID) | ORAL | 0 refills | Status: DC | PRN
Start: 2020-09-11 — End: 2021-03-23
  Filled 2020-09-11: qty 20, 5d supply, fill #0

## 2020-09-11 MED ORDER — TRIAMCINOLONE ACETONIDE 0.1 % EX CREA: TOPICAL_CREAM | CUTANEOUS | 0 refills | Status: DC

## 2020-09-11 MED ORDER — GLIPIZIDE 5 MG PO TABS: ORAL_TABLET | ORAL | 1 refills | Status: DC

## 2020-09-11 MED ORDER — GABAPENTIN 300 MG PO CAPS: 900.0000 mg | ORAL_CAPSULE | Freq: Two times a day (BID) | ORAL | 3 refills | Status: DC

## 2020-09-11 MED ORDER — AMLODIPINE BESYLATE 10 MG PO TABS: 10.0000 mg | ORAL_TABLET | Freq: Every day | ORAL | 1 refills | Status: DC

## 2020-09-11 MED ORDER — MONTELUKAST SODIUM 10 MG PO TABS: 10.0000 mg | ORAL_TABLET | Freq: Every day | ORAL | 3 refills | Status: DC

## 2020-09-11 NOTE — Patient Instructions (Addendum)
I will temporarily write some hydrocodone if needed for breakthrough pain but if you find yourself needing that more often or higher doses I would need you to meet with pain management specialist or your back specialist.   Just amlodipine for now for blood pressure. Keep a record of your blood pressures outside of the office and bring them to the next office visit.  I will refer you to have hearing test.      Steps to Quit Smoking Smoking tobacco is the leading cause of preventable death. It can affect almost every organ in the body. Smoking puts you and those around you at risk for developing many serious chronic diseases. Quitting smoking can be difficult, but it is one of the best things that you can do for your health. It is nevertoo late to quit. How do I get ready to quit? When you decide to quit smoking, create a plan to help you succeed. Before you quit: Pick a date to quit. Set a date within the next 2 weeks to give you time to prepare. Write down the reasons why you are quitting. Keep this list in places where you will see it often. Tell your family, friends, and co-workers that you are quitting. Support from your loved ones can make quitting easier. Talk with your health care provider about your options for quitting smoking. Find out what treatment options are covered by your health insurance. Identify people, places, things, and activities that make you want to smoke (triggers). Avoid them. What first steps can I take to quit smoking? Throw away all cigarettes at home, at work, and in your car. Throw away smoking accessories, such as Scientist, research (medical). Clean your car. Make sure to empty the ashtray. Clean your home, including curtains and carpets. What strategies can I use to quit smoking? Talk with your health care provider about combining strategies, such as taking medicines while you are also receiving in-person counseling. Using these two strategies together makes you  more likely to succeed in quitting than if you used either strategy on its own. If you are pregnant or breastfeeding, talk with your health care provider about finding counseling or other support strategies to quit smoking. Do not take medicine to help you quit smoking unless your health care provider tells you to do so. To quit smoking: Quit right away Quit smoking completely, instead of gradually reducing how much you smoke over a period of time. Research shows that stopping smoking right away is more successful than gradually quitting. Attend in-person counseling to help you build problem-solving skills. You are more likely to succeed in quitting if you attend counseling sessions regularly. Even short sessions of 10 minutes can be effective. Take medicine You may take medicines to help you quit smoking. Some medicines require a prescription and some you can purchase over-the-counter. Medicines may have nicotine in them to replace the nicotine in cigarettes. Medicines may: Help to stop cravings. Help to relieve withdrawal symptoms. Your health care provider may recommend: Nicotine patches, gum, or lozenges. Nicotine inhalers or sprays. Non-nicotine medicine that is taken by mouth. Find resources Find resources and support systems that can help you to quit smoking and remain smoke-free after you quit. These resources are most helpful when you use them often. They include: Online chats with a Social worker. Telephone quitlines. Printed Furniture conservator/restorer. Support groups or group counseling. Text messaging programs. Mobile phone apps or applications. Use apps that can help you stick to your quit plan by providing  reminders, tips, and encouragement. There are many free apps for mobile devices as well as websites. Examples include Quit Guide from the [employer] and smokefree.gov What things can I do to make it easier to quit?  Reach out to your family and friends for support and encouragement. Call  telephone quitlines (1-800-QUIT-NOW), reach out to support groups, or work with a counselor for support. Ask people who smoke to avoid smoking around you. Avoid places that trigger you to smoke, such as bars, parties, or smoke-break areas at work. Spend time with people who do not smoke. Lessen the stress in your life. Stress can be a smoking trigger for some people. To lessen stress, try: Exercising regularly. Doing deep-breathing exercises. Doing yoga. Meditating. Performing a body scan. This involves closing your eyes, scanning your body from head to toe, and noticing which parts of your body are particularly tense. Try to relax the muscles in those areas. How will I feel when I quit smoking? Day 1 to 3 weeks Within the first 24 hours of quitting smoking, you may start to feel withdrawal symptoms. These symptoms are usually most noticeable 2-3 days after quitting, but they usually do not last for more than 2-3 weeks. You may experience these symptoms: Mood swings. Restlessness, anxiety, or irritability. Trouble concentrating. Dizziness. Strong cravings for sugary foods and nicotine. Mild weight gain. Constipation. Nausea. Coughing or a sore throat. Changes in how the medicines that you take for unrelated issues work in your body. Depression. Trouble sleeping (insomnia). Week 3 and afterward After the first 2-3 weeks of quitting, you may start to notice more positive results, such as: Improved sense of smell and taste. Decreased coughing and sore throat. Slower heart rate. Lower blood pressure. Clearer skin. The ability to breathe more easily. Fewer sick days. Quitting smoking can be very challenging. Do not get discouraged if you are not successful the first time. Some people need to make many attempts to quit before they achieve long-term success. Do your best to stick to your quit plan, and talk with your health care provider if you haveany questions or  concerns. Summary Smoking tobacco is the leading cause of preventable death. Quitting smoking is one of the best things that you can do for your health. When you decide to quit smoking, create a plan to help you succeed. Quit smoking right away, not slowly over a period of time. When you start quitting, seek help from your health care provider, family, or friends. This information is not intended to replace advice given to you by your health care provider. Make sure you discuss any questions you have with your healthcare provider. Document Revised: 10/02/2018 Document Reviewed: 03/28/2018 Elsevier Patient Education  [place].

## 2020-09-11 NOTE — Progress Notes (Signed)
Subjective:  Patient ID: Alexander [name], male    DOB: 1945/12/16  Age: 75 y.o. MRN: [id number]  CC:  Chief Complaint  Patient presents with   Establish Care    Patient is accompanied by his daughter. She has concerns about his hearing and would like that to be checked    HPI Alexander [name] presents for   New patient to establish care.  Previously under the care of Sherrie Mustache, NP with [facility], but appears he was establishing with her in April visit, previously Dr. Pamella Pert.  Last note reviewed from April. GI, Dr. Benson Norway  Ophthalmology Dr. Katy Fitch, Dr. Valetta Close Urology, Dr. Nevada Crane, plan to follow-up with Dr. Jeffie Pollock at [facility] - stable in April.  Oncology nurse navigator, Cira Rue, RN Neurosurgery, Dr. Brien Few  History of hypertension, DM, CAD, PAD, nicotine addiction, seasonal allergies, chronic back pain with sciatica.,  Angioedema, previously seen by allergist with EpiPen available, prostate cancer in remission, erectile dysfunction, fecal incontinence.  Nicotine addiction Smoking cessation discussed previously, unable to take Wellbutrin.  Yearly CT scans.  Pulmonary emphysema on prior CT. Considering quitting. Wellbutrin did not work, patch did not work.  1 pack per day.  CT on 09/06/20.   Chronic low back pain Prior history of sciatica, left lumbar radiculopathy.  Dr. Brien Few with prior epidural injections.  Previously treated with hydrocodone, gabapentin 900 mg twice daily, Mobic as needed.   Referred to physical therapy in April, started on Cymbalta.  Controlled substance database reviewed, last filled hydrocodone 10/325 in January - # 20, prior #20 in 08/2019,  Cymbalta 51m made him too tired - knocks him out. Took for 3 days.  Hydrocodone 156mworked better - 1/2 pill 3-4 times per month when having pain in back. No sedation when taking hydrocodone. No alcohol. No IDU.  Could not afford therapy or time to do therapy.   Seasonal allergies: Managed well  with singulair, flonase.   Hearing difficulty  With daughter, who lives with him, tv is much louder, has to repeat self at times.   Hypertension: Ran out of triamterene hctz past month or so.  On amlodipine daily. 1060m Home readings: none.  No CP/dizziness, no dyspnea. No new leg swelling.  BP Readings from Last 3 Encounters:  09/11/20 138/80  05/10/20 (!) 148/80  10/26/19 140/80   Lab Results  Component Value Date   CREATININE 0.97 05/10/2020   Diabetes: With circulatory complications of CAD, PAD.  Treated with Metformin 1000 mg twice daily, glipizide 5 mg twice daily.  No home readings.  On statin QD. No new side effects.  Uses TAC cream when dry or itching skin. Occasional Aveeno.   Microalbumin:  nl ratio in 10/2019.  Lab Results  Component Value Date   HGBA1C 6.6 (H) 05/10/2020   HGBA1C 6.4 (A) 10/26/2019   HGBA1C 6.4 (H) 04/27/2019   Lab Results  Component Value Date   MICROALBUR 3.2 04/20/2015   LDLCALC 44 05/10/2020   CREATININE 0.97 05/10/2020     History Patient Active Problem List   Diagnosis Date Noted   Right carpal tunnel syndrome 06/08/2019   Malignant neoplasm of prostate ([place]/06/2018   Allergic reaction 08/10/2018   Angioedema 08/10/2018   Peripheral arterial occlusive disease ([place]/08/2016   Hyperlipidemia LDL goal <70 05/01/2015   Tobacco use disorder 12/09/2013   Coronary artery disease involving native coronary artery of native heart without angina pectoris 12/09/2013   Erectile dysfunction due to diseases classified elsewhere  12/09/2013   Type 2 diabetes mellitus with other circulatory complications ([place]) 32/20/2542   Left lumbar radiculopathy 09/30/2012   Chronic rhinitis 04/08/2011   Hypertension    HTN (hypertension), benign 03/09/2011   History of prostate cancer 03/09/2011   Past Medical History:  Diagnosis Date   Angio-edema    CAD (coronary artery disease)    Per [place] New Patient Packet    Carpal tunnel syndrome     Per [place] New Patient Packet    Diabetes mellitus without complication ([place])    Hypertension    Neuromuscular disorder ([place])    Peripheral arterial disease ([place]) 11/14/2016   non-occlusive, asymptomatic, seen by vascular surgery Dr. Oneida Alar who rec repeated ABIs annually   Prostate cancer [facility])    Past Surgical History:  Procedure Laterality Date   CATARACT EXTRACTION Right 08/23/2020   COLON SURGERY     "locked bowel" fixed   COLONOSCOPY  2019   HERNIA REPAIR     LEFT HEART CATHETERIZATION WITH CORONARY ANGIOGRAM N/A 09/15/2013   Procedure: LEFT HEART CATHETERIZATION WITH CORONARY ANGIOGRAM;  Surgeon: Peter M Martinique, MD;  Location: [facility] CATH LAB;  Service: Cardiovascular;  Laterality: N/A;   PROSTATE SURGERY  2005   Allergies  Allergen Reactions   Lisinopril Swelling    angioedema   Prior to Admission medications   Medication Sig Start Date End Date Taking? Authorizing Provider  amLODipine (NORVASC) 10 MG tablet Take 1 tablet (10 mg total) by mouth daily. 10/26/19  Yes Jacelyn Pi, Lilia Argue, MD  aspirin EC 81 MG tablet Take 81 mg by mouth daily as needed for mild pain.    Yes [provider]  atorvastatin (LIPITOR) 40 MG tablet Take 40 mg by mouth daily.   Yes [provider]  Cholecalciferol (VITAMIN D3) 50 MCG (2000 UT) TABS Take by mouth.   Yes [provider]  diphenhydrAMINE (BENADRYL) 25 MG tablet Take 25 mg by mouth every 6 (six) hours as needed.   Yes [provider]  EPINEPHrine (EPIPEN 2-PAK) 0.3 mg/0.3 mL IJ SOAJ injection Inject 0.3 mLs (0.3 mg total) into the muscle as needed for anaphylaxis. 08/10/18  Yes Bobbitt, [place] Muta, MD  fluticasone [facility]) 50 MCG/ACT nasal spray Place 2 sprays into both nostrils daily. 03/18/19  Yes Jacelyn Pi, Lilia Argue, MD  gabapentin (NEURONTIN) 300 MG capsule TAKE 3 CAPSULES BY MOUTH  TWICE DAILY 10/27/19  Yes Jacelyn Pi, Irma M, MD  glipiZIDE (GLUCOTROL) 5 MG tablet TAKE 1 TABLET BY MOUTH  TWICE DAILY  BEFORE A MEAL 10/26/19  Yes Jacelyn Pi, Lilia Argue, MD  metFORMIN (GLUCOPHAGE) 1000 MG tablet Take 1 tablet (1,000 mg total) by mouth 2 (two) times daily with a meal. 06/08/20  Yes Eubanks, Carlos American, NP  montelukast (SINGULAIR) 10 MG tablet Take 1 tablet (10 mg total) by mouth daily. 09/07/19  Yes Jacelyn Pi, Lilia Argue, MD  moxifloxacin (VIGAMOX) 0.5 % ophthalmic solution Place 1 drop into the right eye (operative eye) 4 (four) times daily. Start after surgery 08/08/20  Yes   sildenafil (VIAGRA) 100 MG tablet Take 100 mg by mouth as needed for erectile dysfunction.    Yes [provider]  Aspirin-Salicylamide-Caffeine (BC HEADACHE POWDER PO) Take 1 Package by mouth daily as needed (for back pain). Patient not taking: Reported on 09/11/2020    [provider]  DULoxetine (CYMBALTA) 30 MG capsule Take 1 capsule (30 mg total) by mouth daily. Patient not taking: Reported on 09/11/2020 05/10/20   Dewaine Oats,  Carlos American, NP  fexofenadine (ALLEGRA) 180 MG tablet Take 1 tablet (180 mg total) by mouth daily. Patient not taking: Reported on 09/11/2020 08/10/18   Adelina Mings, MD  Na Sulfate-K Sulfate-Mg Sulf (SUPREP BOWEL PREP KIT) 17.5-3.13-1.6 GM/177ML SOLN Take as directed 05/15/20     triamcinolone cream (KENALOG) 0.1 % APPLY TOPICALLY TWICE DAILY Patient not taking: Reported on 09/11/2020 09/02/19   Jacelyn Pi, Lilia Argue, MD  triamterene-hydrochlorothiazide (MAXZIDE-25) 37.5-25 MG tablet Take 1 tablet by mouth daily. Patient not taking: Reported on 09/11/2020 10/26/19   Jacelyn Pi, Lilia Argue, MD   Social History   Socioeconomic History   Marital status: Widowed    Spouse name: Not on file   Number of children: 2   Years of education: 9   Highest education level: Not on file  Occupational History   Occupation: retired  Tobacco Use   Smoking status: Every Day    Packs/day: 1.00    Years: 55.00    Pack years: 55.00    Types: Cigarettes   Smokeless tobacco: Never  Vaping Use    Vaping Use: Never used  Substance and Sexual Activity   Alcohol use: No   Drug use: No   Sexual activity: Yes  Other Topics Concern   Not on file  Social History Narrative   Patient is single and his daughter lives with him.   Patient has two children.   Patient has a 9th grade education.   Patient works in Architect (part-time).   Patient drinks one to two cups of coffee daily.   Patient is right-handed.         Per [facility] New Patient Packet:   Diet:Left blank      Caffeine:Yes      Married, if yes what year: Widowed      Do you live in a house, apartment, assisted living, condo, trailer, ect: House, 3 persons      Is it one or more stories: One      Pets: No      Current/Past profession: Interior and spatial designer      Highest level or education completed:       Exercise:         No         Type and how often:          Living Will: No   DNR: No   POA/HPOA: No      Functional Status:   Do you have difficulty bathing or dressing yourself? No   Do you have difficulty preparing food or eating? No   Do you have difficulty managing your medications? No   Do you have difficulty managing your finances? No   Do you have difficulty affording your medications? No   Social Determinants of Radio broadcast assistant Strain: Not on file  Food Insecurity: Not on file  Transportation Needs: Not on file  Physical Activity: Not on file  Stress: Not on file  Social Connections: Not on file  Intimate Partner Violence: Not on file    Review of Systems   Objective:   Vitals:   09/11/20 1449  BP: 138/80  Pulse: 82  Resp: 17  Temp: 98.1 F (36.7 C)  TempSrc: Temporal  SpO2: 96%  Weight: 130 lb 3.2 oz (59.1 kg)     Physical Exam Vitals reviewed.  Constitutional:      Appearance: He is well-developed.  HENT:     Head: Normocephalic and atraumatic.  Right Ear: Ear canal normal. There is no impacted cerumen.     Left Ear: Ear canal normal. There is no impacted cerumen.      Ears:     Comments: External canals clear bilaterally. Neck:     Vascular: No carotid bruit or JVD.  Cardiovascular:     Rate and Rhythm: Normal rate and regular rhythm.     Heart sounds: Normal heart sounds. No murmur heard. Pulmonary:     Effort: Pulmonary effort is normal.     Breath sounds: Normal breath sounds. No rales.  Musculoskeletal:     Right lower leg: No edema.     Left lower leg: No edema.     Comments: Ambulating without difficulty.   Skin:    General: Skin is warm and dry.  Neurological:     Mental Status: He is alert and oriented to person, place, and time.  Psychiatric:        Mood and Affect: Mood normal.     54 minutes spent during visit, including chart review, discussion of medial hx, prior study review and discussion, counseling and assimilation of information, exam, discussion of plan, and chart completion.    Assessment & Plan:  BRALON ANTKOWIAK is a 75 y.o. male . Lumbar radiculopathy, chronic - Plan: HYDROcodone-acetaminophen (NORCO/VICODIN) 5-325 MG tablet, gabapentin (NEURONTIN) 300 MG capsule  -With infrequent use of hydrocodone previously, agreed on temporary refill of 5 mg dose, (previously took half dosing of 10 mg).  If persistent need or new side effects with that medication will need to meet with his spine specialist, and possibly pain management.  Understanding expressed.  Chronic rhinitis - Plan: montelukast (SINGULAIR) 10 MG tablet  Overall stable, continue current regimen.  Follow-up with allergist regarding angioedema as well.  Asymptomatic at present.  History of prostate cancer  -Continue follow-up with urology.  Type 2 diabetes mellitus with other circulatory complications (HCC) - Plan: glipiZIDE (GLUCOTROL) 5 MG tablet, Lipid panel, Hemoglobin A1c  -Labs noted after visit, hypoglycemia, well-controlled by A1c.  We will decrease his glipizide to 2.5 mg twice daily, no other changes for now.  hypoglycemia and RTC  precautions.  Essential hypertension - Plan: amLODipine (NORVASC) 10 MG tablet  -Stable with current regimen, continue just amlodipine for now with option to restart triamterene HCTZ if higher readings.  RTC precautions  Hyperlipidemia LDL goal <70 - Plan: Lipid panel, Comprehensive metabolic panel  -Continue statin.  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  Cigarette nicotine dependence without complication  -Handout given on steps to quit smoking, can meet further if medication is needed but did not tolerate Wellbutrin previously.  Dermatitis - Plan: triamcinolone cream (KENALOG) 0.1 %  -Intermittent need for triamcinolone cream, will continue same with RTC precautions.  Meds ordered this encounter  Medications   montelukast (SINGULAIR) 10 MG tablet    Sig: Take 1 tablet (10 mg total) by mouth daily.    Dispense:  90 tablet    Refill:  3   HYDROcodone-acetaminophen (NORCO/VICODIN) 5-325 MG tablet    Sig: Take 1 tablet by mouth every 6 (six) hours as needed for moderate pain.    Dispense:  20 tablet    Refill:  0   triamcinolone cream (KENALOG) 0.1 %    Sig: APPLY TOPICALLY TWICE DAILY    Dispense:  45 g    Refill:  0   gabapentin (NEURONTIN) 300 MG capsule  Sig: Take 3 capsules (900 mg total) by mouth 2 (two) times daily.    Dispense:  540 capsule    Refill:  3    Requesting 1 year supply   glipiZIDE (GLUCOTROL) 5 MG tablet    Sig: TAKE 1 TABLET BY MOUTH  TWICE DAILY BEFORE A MEAL    Dispense:  180 tablet    Refill:  1    Requesting 1 year supply   amLODipine (NORVASC) 10 MG tablet    Sig: Take 1 tablet (10 mg total) by mouth daily.    Dispense:  90 tablet    Refill:  1   Patient Instructions  I will temporarily write some hydrocodone if needed for breakthrough pain but if you find yourself needing that more often or higher doses I would need you to meet with pain  management specialist or your back specialist.   Just amlodipine for now for blood pressure. Keep a record of your blood pressures outside of the office and bring them to the next office visit.  I will refer you to have hearing test.      Steps to Quit Smoking Smoking tobacco is the leading cause of preventable death. It can affect almost every organ in the body. Smoking puts you and those around you at risk for developing many serious chronic diseases. Quitting smoking can be difficult, but it is one of the best things that you can do for your health. It is nevertoo late to quit. How do I get ready to quit? When you decide to quit smoking, create a plan to help you succeed. Before you quit: Pick a date to quit. Set a date within the next 2 weeks to give you time to prepare. Write down the reasons why you are quitting. Keep this list in places where you will see it often. Tell your family, friends, and co-workers that you are quitting. Support from your loved ones can make quitting easier. Talk with your health care provider about your options for quitting smoking. Find out what treatment options are covered by your health insurance. Identify people, places, things, and activities that make you want to smoke (triggers). Avoid them. What first steps can I take to quit smoking? Throw away all cigarettes at home, at work, and in your car. Throw away smoking accessories, such as Scientist, research (medical). Clean your car. Make sure to empty the ashtray. Clean your home, including curtains and carpets. What strategies can I use to quit smoking? Talk with your health care provider about combining strategies, such as taking medicines while you are also receiving in-person counseling. Using these two strategies together makes you more likely to succeed in quitting than if you used either strategy on its own. If you are pregnant or breastfeeding, talk with your health care provider about finding  counseling or other support strategies to quit smoking. Do not take medicine to help you quit smoking unless your health care provider tells you to do so. To quit smoking: Quit right away Quit smoking completely, instead of gradually reducing how much you smoke over a period of time. Research shows that stopping smoking right away is more successful than gradually quitting. Attend in-person counseling to help you build problem-solving skills. You are more likely to succeed in quitting if you attend counseling sessions regularly. Even short sessions of 10 minutes can be effective. Take medicine You may take medicines to help you quit smoking. Some medicines require a prescription and some you can purchase  over-the-counter. Medicines may have nicotine in them to replace the nicotine in cigarettes. Medicines may: Help to stop cravings. Help to relieve withdrawal symptoms. Your health care provider may recommend: Nicotine patches, gum, or lozenges. Nicotine inhalers or sprays. Non-nicotine medicine that is taken by mouth. Find resources Find resources and support systems that can help you to quit smoking and remain smoke-free after you quit. These resources are most helpful when you use them often. They include: Online chats with a Social worker. Telephone quitlines. Printed Furniture conservator/restorer. Support groups or group counseling. Text messaging programs. Mobile phone apps or applications. Use apps that can help you stick to your quit plan by providing reminders, tips, and encouragement. There are many free apps for mobile devices as well as websites. Examples include Quit Guide from the [employer] and smokefree.gov What things can I do to make it easier to quit?  Reach out to your family and friends for support and encouragement. Call telephone quitlines (1-800-QUIT-NOW), reach out to support groups, or work with a counselor for support. Ask people who smoke to avoid smoking around you. Avoid places that  trigger you to smoke, such as bars, parties, or smoke-break areas at work. Spend time with people who do not smoke. Lessen the stress in your life. Stress can be a smoking trigger for some people. To lessen stress, try: Exercising regularly. Doing deep-breathing exercises. Doing yoga. Meditating. Performing a body scan. This involves closing your eyes, scanning your body from head to toe, and noticing which parts of your body are particularly tense. Try to relax the muscles in those areas. How will I feel when I quit smoking? Day 1 to 3 weeks Within the first 24 hours of quitting smoking, you may start to feel withdrawal symptoms. These symptoms are usually most noticeable 2-3 days after quitting, but they usually do not last for more than 2-3 weeks. You may experience these symptoms: Mood swings. Restlessness, anxiety, or irritability. Trouble concentrating. Dizziness. Strong cravings for sugary foods and nicotine. Mild weight gain. Constipation. Nausea. Coughing or a sore throat. Changes in how the medicines that you take for unrelated issues work in your body. Depression. Trouble sleeping (insomnia). Week 3 and afterward After the first 2-3 weeks of quitting, you may start to notice more positive results, such as: Improved sense of smell and taste. Decreased coughing and sore throat. Slower heart rate. Lower blood pressure. Clearer skin. The ability to breathe more easily. Fewer sick days. Quitting smoking can be very challenging. Do not get discouraged if you are not successful the first time. Some people need to make many attempts to quit before they achieve long-term success. Do your best to stick to your quit plan, and talk with your health care provider if you haveany questions or concerns. Summary Smoking tobacco is the leading cause of preventable death. Quitting smoking is one of the best things that you can do for your health. When you decide to quit smoking, create a  plan to help you succeed. Quit smoking right away, not slowly over a period of time. When you start quitting, seek help from your health care provider, family, or friends. This information is not intended to replace advice given to you by your health care provider. Make sure you discuss any questions you have with your healthcare provider. Document Revised: 10/02/2018 Document Reviewed: 03/28/2018 Elsevier Patient Education  2022 [place],   Merri Ray, MD [facility], [facility]  09/11/20 4:15 PM

## 2020-09-12 ENCOUNTER — Encounter: Payer: Self-pay | Admitting: Family Medicine

## 2020-09-12 ENCOUNTER — Telehealth: Payer: Self-pay

## 2020-09-12 LAB — COMPREHENSIVE METABOLIC PANEL
ALT: 8 U/L (ref 0–53)
AST: 12 U/L (ref 0–37)
Albumin: 4.3 g/dL (ref 3.5–5.2)
Alkaline Phosphatase: 104 U/L (ref 39–117)
BUN: 14 mg/dL (ref 6–23)
CO2: 24 mEq/L (ref 19–32)
Calcium: 9.7 mg/dL (ref 8.4–10.5)
Chloride: 106 mEq/L (ref 96–112)
Creatinine, Ser: 1.11 mg/dL (ref 0.40–1.50)
GFR: 65.43 mL/min (ref >60.00)
Glucose, Bld: 48 mg/dL — CL (ref 70–99)
Potassium: 4.2 mEq/L (ref 3.5–5.1)
Sodium: 139 mEq/L (ref 135–145)
Total Bilirubin: 0.5 mg/dL (ref 0.2–1.2)
Total Protein: 7.4 g/dL (ref 6.0–8.3)

## 2020-09-12 LAB — HEMOGLOBIN A1C: Hgb A1c MFr Bld: 6.9 % — ABNORMAL HIGH (ref 4.6–6.5)

## 2020-09-12 LAB — LIPID PANEL
Cholesterol: 87 mg/dL (ref 0–200)
HDL: 32.9 mg/dL — ABNORMAL LOW (ref >39.00)
LDL Cholesterol: 41 mg/dL (ref 0–99)
NonHDL: 54.02
Total CHOL/HDL Ratio: 3
Triglycerides: 67 mg/dL (ref 0.0–149.0)
VLDL: 13.4 mg/dL (ref 0.0–40.0)

## 2020-09-12 NOTE — Telephone Encounter (Signed)
Critical lab call from [facility] about pt glucose, reports glucose low at 48 direct message to provider as well.

## 2020-09-20 ENCOUNTER — Encounter: Payer: Self-pay | Admitting: Family Medicine

## 2020-09-21 NOTE — Progress Notes (Signed)
Please call patient and let them  know their  low dose Ct was read as a Lung RADS 2: nodules that are benign in appearance and behavior with a very low likelihood of becoming a clinically active cancer due to size or lack of growth. Recommendation per radiology is for a repeat LDCT in 12 months. .Please let them  know we will order and schedule their  annual screening scan for 08/2021. Please let them  know there was notation of CAD on their  scan.  Please remind the patient  that this is a non-gated exam therefore degree or severity of disease  cannot be determined. Please have them  follow up with their PCP regarding potential risk factor modification, dietary therapy or pharmacologic therapy if clinically indicated. Pt.  is  currently on statin therapy. Please place order for annual  screening scan for  08/2021 and fax results to PCP. Thanks so much.  + CAD, on statin , no cardiology notes in Epic. Have him follow up with PCP. Thanks so much

## 2020-09-22 ENCOUNTER — Encounter: Payer: Self-pay | Admitting: *Deleted

## 2020-09-22 DIAGNOSIS — Z87891 Personal history of nicotine dependence: Secondary | ICD-10-CM

## 2020-09-22 DIAGNOSIS — F17210 Nicotine dependence, cigarettes, uncomplicated: Secondary | ICD-10-CM

## 2020-09-28 ENCOUNTER — Other Ambulatory Visit (HOSPITAL_COMMUNITY): Payer: Self-pay

## 2020-10-17 NOTE — Progress Notes (Signed)
[facility])                                            [facility] Team                                        Statin Quality Measure Assessment    10/17/2020  Travian Kerner [facility] [birth year] [id number]  Per review of chart and payor information, this patient has been flagged for non-adherence to the following CMS Quality Measure:   [x]  Statin Use in Persons with Diabetes  [x]  Statin Use in Persons with Cardiovascular Disease  The ASCVD Risk score (Arnett DK, et al., 2019) failed to calculate for the following reasons:   The valid total cholesterol range is 130 to 320 mg/dL LDL 44 mg/dL (05/10/2020)  Currently prescribed statin:  []  Yes [x]  No     Comments: Patient was previously on atorvastatin. Per chart review, this patient previously reported statin caused leg pain. I called and want to talk to patient but he was not home. Per daughter, listed as person for ' release of information', Atorvastatin was last dispensed 09/09/2019 via [facility]. After our discussion, she called her father who stated he is taking atorvastatin and "all the medications in that box". However, the daughter feels that the patient is likely not taking statin given fill history. Per recent note w/ PCP in August, patient should continue atorvastatin.  Please consider the following recommendations:  If clinically appropriate, please consider associating statin exclusion code or discuss medication compliance at the next O/v.    Thank you for your time,  Kristeen Miss, [place] Cell: [phone]

## 2020-10-19 ENCOUNTER — Ambulatory Visit (INDEPENDENT_AMBULATORY_CARE_PROVIDER_SITE_OTHER): Payer: Medicare Other | Admitting: Family Medicine

## 2020-10-19 ENCOUNTER — Other Ambulatory Visit: Payer: Self-pay

## 2020-10-19 ENCOUNTER — Encounter: Payer: Self-pay | Admitting: Family Medicine

## 2020-10-19 VITALS — BP 138/84 | HR 82 | Temp 97.6°F | Ht 65.0 in | Wt 127.0 lb

## 2020-10-19 DIAGNOSIS — E1159 Type 2 diabetes mellitus with other circulatory complications: Secondary | ICD-10-CM

## 2020-10-19 DIAGNOSIS — E162 Hypoglycemia, unspecified: Secondary | ICD-10-CM | POA: Diagnosis not present

## 2020-10-19 MED ORDER — BLOOD GLUCOSE METER KIT: PACK | 0 refills | Status: DC

## 2020-10-19 NOTE — Patient Instructions (Addendum)
Continue lower dose of glipizide. That should lessen risk of low blood sugars. See information below.if any return of low blood sugars, be seen right away.  Recheck diabetes in 2 months.  Return to the clinic or go to the nearest emergency room if any of your symptoms worsen or new symptoms occur.  Hypoglycemia Hypoglycemia occurs when the level of sugar (glucose) in the blood is too low. Hypoglycemia can happen in people who have or do not have diabetes. It can develop quickly, and it can be a medical emergency. For most people, a blood glucose level below 70 mg/dL (3.9 mmol/L) is considered hypoglycemia. Glucose is a type of sugar that provides the body's main source of energy. Certain hormones (insulin and glucagon) control the level of glucose in the blood. Insulin lowers blood glucose, and glucagon raises blood glucose. Hypoglycemia can result from having too much insulin in the bloodstream, or from not eating enough food that contains glucose. You may also have reactive hypoglycemia, which happens within 4 hours after eating a meal. What are the causes? Hypoglycemia occurs most often in people who have diabetes and may be caused by: Diabetes medicine. Not eating enough, or not eating often enough. Increased physical activity. Drinking alcohol on an empty stomach. If you do not have diabetes, hypoglycemia may be caused by: A tumor in the pancreas. Not eating enough, or not eating for long periods at a time (fasting). A severe infection or illness. Problems after having bariatric surgery. Organ failure, such as kidney or liver failure. Certain medicines. What increases the risk? Hypoglycemia is more likely to develop in people who: Have diabetes and take medicines to lower blood glucose. Abuse alcohol. Have a severe illness. What are the signs or symptoms? Symptoms vary depending on whether the condition is mild, moderate, or severe. Mild hypoglycemia Hunger. Sweating and feeling  clammy. Dizziness or feeling light-headed. Sleepiness or restless sleep. Nausea. Increased heart rate. Headache. Blurry vision. Mood changes, such as irritability or anxiety. Tingling or numbness around the mouth, lips, or tongue. Moderate hypoglycemia Confusion and poor judgment. Behavior changes. Weakness. Irregular heartbeat. A change in coordination. Severe hypoglycemia Severe hypoglycemia is a medical emergency. It can cause: Fainting. Seizures. Loss of consciousness (coma). Death. How is this diagnosed? Hypoglycemia is diagnosed with a blood test to measure your blood glucose level. This blood test is done while you are having symptoms. Your health care provider may also do a physical exam and review your medical history. How is this treated? This condition can be treated by immediately eating or drinking something that contains sugar with 15 grams of fast-acting carbohydrate, such as: 4 oz (120 mL) of fruit juice. 4 oz (120 mL) of regular soda (not diet soda). Several pieces of hard candy. Check food labels to find out how many pieces to eat for 15 grams. 1 Tbsp (15 mL) of sugar or honey. 4 glucose tablets. 1 tube of glucose gel. Treating hypoglycemia if you have diabetes If you are alert and able to swallow safely, follow the 15:15 rule: Take 15 grams of a fast-acting carbohydrate. Talk with your health care provider about how much you should take. Options for getting 15 grams of fast-acting carbohydrate include: Glucose tablets (take 4 tablets). Several pieces of hard candy. Check food labels to find out how many pieces to eat for 15 grams. 4 oz (120 mL) of fruit juice. 4 oz (120 mL) of regular soda (not diet soda). 1 Tbsp (15 mL) of sugar or honey. 1  tube of glucose gel. Check your blood glucose 15 minutes after you take the carbohydrate. If the repeat blood glucose level is still at or below 70 mg/dL (3.9 mmol/L), take 15 grams of a carbohydrate again. If your  blood glucose level does not increase above 70 mg/dL (3.9 mmol/L) after 3 tries, seek emergency medical care. After your blood glucose level returns to normal, eat a meal or a snack within 1 hour.  Treating severe hypoglycemia Severe hypoglycemia is when your blood glucose level is below 54 mg/dL (3 mmol/L). Severe hypoglycemia is a medical emergency. Get medical help right away. If you have severe hypoglycemia and you cannot eat or drink, you will need to be given glucagon. A family member or close friend should learn how to check your blood glucose and how to give you glucagon. Ask your health care provider if you need to have an emergency glucagon kit available. Severe hypoglycemia may need to be treated in a hospital. The treatment may include getting glucose through an IV. You may also need treatment for the cause of your hypoglycemia. Follow these instructions at home: General instructions Take over-the-counter and prescription medicines only as told by your health care provider. Monitor your blood glucose as told by your health care provider. If you drink alcohol: Limit how much you have to: 0-1 drink a day for women who are not pregnant. 0-2 drinks a day for men. Know how much alcohol is in your drink. In the [place] one drink equals one 12 oz bottle of beer (355 mL), one 5 oz glass of wine (148 mL), or one 1 oz glass of hard liquor (44 mL). Be sure to eat food along with drinking alcohol. Be aware that alcohol is absorbed quickly and may have lingering effects that may result in hypoglycemia later. Be sure to do ongoing glucose monitoring. Keep all follow-up visits. This is important. If you have diabetes: Always have a fast-acting carbohydrate (15 grams) option with you to treat low blood glucose. Follow your diabetes management plan as directed by your health care provider. Make sure you: Know the symptoms of hypoglycemia. It is important to treat it right away to prevent it from  becoming severe. Check your blood glucose as often as told. Always check before and after exercise. Always check your blood glucose before you drive a motorized vehicle. Take your medicines as told. Follow your meal plan. Eat on time, and do not skip meals. Share your diabetes management plan with people in your workplace, school, and household. Carry a medical alert card or wear medical alert jewelry. Where to find more information [employer] Association: www.diabetes.org Contact a health care provider if: You have problems keeping your blood glucose in your target range. You have frequent episodes of hypoglycemia. Get help right away if: You continue to have hypoglycemia symptoms after eating or drinking something that contains 15 grams of fast-acting carbohydrate, and you cannot get your blood glucose above 70 mg/dL (3.9 mmol/L) while following the 15:15 rule. Your blood glucose is below 54 mg/dL (3 mmol/L). You have a seizure. You faint. These symptoms may represent a serious problem that is an emergency. Do not wait to see if the symptoms will go away. Get medical help right away. Call your local emergency services (911 in the [place] Do not drive yourself to the hospital. Summary Hypoglycemia occurs when the level of sugar (glucose) in the blood is too low. Hypoglycemia can happen in people who have or do not have  diabetes. It can develop quickly, and it can be a medical emergency. Make sure you know the symptoms of hypoglycemia and how to treat it. Always have a fast-acting carbohydrate option with you to treat low blood sugar. This information is not intended to replace advice given to you by your health care provider. Make sure you discuss any questions you have with your health care provider. Document Revised: 12/09/2019 Document Reviewed: 12/09/2019 [employer] Patient Education  2022 [employer].

## 2020-10-19 NOTE — Progress Notes (Signed)
Subjective:  Patient ID: Alexander [name], male    DOB: 11/30/1945  Age: 75 y.o. MRN: [id number]  CC:  Chief Complaint  Patient presents with   Follow-up     for med and lab follow up.    HPI Alexander [name] presents for   Follow-up from visit August 22, establish care with multiple other concerns discussed at that time.  Diabetes with hypoglycemia: Glucose 48 on last labs. Dtr had noticed him being sluggish at times. Glipizide decreased to 2.48m BID. Not feeling sluggish since last visit. No hypoglycemia sx's. Not checking readings. Lab Results  Component Value Date   HGBA1C 6.9 (H) 09/11/2020  At end of visit noted some longstanding issues of R shoulder, prior collarbone issue and soreness after covid shot - no recent changes. He plans  separate appointment if continues to bother him or other new issues.   History Patient Active Problem List   Diagnosis Date Noted   Right carpal tunnel syndrome 06/08/2019   Malignant neoplasm of prostate ([place] 10/27/2018   Allergic reaction 08/10/2018   Angioedema 08/10/2018   Peripheral arterial occlusive disease ([place] 10/28/2016   Hyperlipidemia LDL goal <70 05/01/2015   Tobacco use disorder 12/09/2013   Coronary artery disease involving native coronary artery of native heart without angina pectoris 12/09/2013   Erectile dysfunction due to diseases classified elsewhere 12/09/2013   Type 2 diabetes mellitus with other circulatory complications ([facility] [place] 160/73/7106  Left lumbar radiculopathy 09/30/2012   Chronic rhinitis 04/08/2011   Hypertension    HTN (hypertension), benign 03/09/2011   History of prostate cancer 03/09/2011   Past Medical History:  Diagnosis Date   Angio-edema    CAD (coronary artery disease)    [facility]    Carpal tunnel syndrome    [place] [facility]    Diabetes mellitus without complication ([facility]    Hypertension    Neuromuscular disorder ([facility]    Peripheral arterial disease ([place]  11/14/2016   non-occlusive, asymptomatic, seen by vascular surgery Dr. FOneida Alarwho rec repeated ABIs annually   Prostate cancer ([facility]    Past Surgical History:  Procedure Laterality Date   CATARACT EXTRACTION Right 08/23/2020   COLON SURGERY     "locked bowel" fixed   COLONOSCOPY  2019   HERNIA REPAIR     LEFT HEART CATHETERIZATION WITH CORONARY ANGIOGRAM N/A 09/15/2013   Procedure: LEFT HEART CATHETERIZATION WITH CORONARY ANGIOGRAM;  Surgeon: Peter M JMartinique MD;  [facility];  Service: Cardiovascular;  Laterality: N/A;   PROSTATE SURGERY  2005   Allergies  Allergen Reactions   Lisinopril Swelling    angioedema   Prior to Admission medications   Medication Sig Start Date End Date Taking? Authorizing Provider  amLODipine (NORVASC) 10 MG tablet Take 1 tablet (10 mg total) by mouth daily. 09/11/20  Yes GWendie Agreste MD  aspirin EC 81 MG tablet Take 81 mg by mouth daily as needed for mild pain.    Yes [provider]  atorvastatin (LIPITOR) 40 MG tablet Take 40 mg by mouth daily.   Yes [provider]  Cholecalciferol (VITAMIN D3) 50 MCG (2000 UT) TABS Take by mouth.   Yes [provider]  diphenhydrAMINE (BENADRYL) 25 MG tablet Take 25 mg by mouth every 6 (six) hours as needed.   Yes [provider]  fexofenadine (ALLEGRA) 180 MG tablet Take 1 tablet (180 mg total) by mouth daily. 08/10/18  Yes Bobbitt, RSedalia Muta MD  fluticasone (FLONASE) 50 MCG/ACT nasal spray Place 2 sprays into both nostrils daily. 03/18/19  Yes Jacelyn Pi, Lilia Argue, MD  gabapentin (NEURONTIN) 300 MG capsule Take 3 capsules (900 mg total) by mouth 2 (two) times daily. 09/11/20  Yes Wendie Agreste, MD  glipiZIDE (GLUCOTROL) 5 MG tablet TAKE 1 TABLET BY MOUTH  TWICE DAILY BEFORE A MEAL 09/11/20  Yes Wendie Agreste, MD  HYDROcodone-acetaminophen (NORCO/VICODIN) 5-325 MG tablet Take 1 tablet by mouth every 6 (six) hours as needed for moderate pain. 09/11/20  Yes  Wendie Agreste, MD  metFORMIN (GLUCOPHAGE) 1000 MG tablet Take 1 tablet (1,000 mg total) by mouth 2 (two) times daily with a meal. 06/08/20  Yes Eubanks, Carlos American, NP  montelukast (SINGULAIR) 10 MG tablet Take 1 tablet (10 mg total) by mouth daily. 09/11/20  Yes Wendie Agreste, MD  sildenafil (VIAGRA) 100 MG tablet Take 100 mg by mouth as needed for erectile dysfunction.    Yes [provider]  triamcinolone cream (KENALOG) 0.1 % APPLY TOPICALLY TWICE DAILY 09/11/20  Yes Wendie Agreste, MD  EPINEPHrine (EPIPEN 2-PAK) 0.3 mg/0.3 mL IJ SOAJ injection Inject 0.3 mLs (0.3 mg total) into the muscle as needed for anaphylaxis. Patient not taking: Reported on 10/19/2020 08/10/18   Bobbitt, Sedalia Muta, MD  moxifloxacin (VIGAMOX) 0.5 % ophthalmic solution Place 1 drop into the right eye (operative eye) 4 (four) times daily. Start after surgery Patient not taking: Reported on 10/19/2020 08/08/20     Na Sulfate-K Sulfate-Mg Sulf (SUPREP BOWEL PREP KIT) 17.5-3.13-1.6 GM/177ML SOLN Take as directed 05/15/20      Social History   Socioeconomic History   Marital status: Widowed    Spouse name: Not on file   Number of children: 2   Years of education: 9   Highest education level: Not on file  Occupational History   Occupation: retired  Tobacco Use   Smoking status: Every Day    Packs/day: 1.00    Years: 55.00    Pack years: 55.00    Types: Cigarettes   Smokeless tobacco: Never  Vaping Use   Vaping Use: Never used  Substance and Sexual Activity   Alcohol use: No   Drug use: No   Sexual activity: Yes  Other Topics Concern   Not on file  Social History Narrative   Patient is single and his daughter lives with him.   Patient has two children.   Patient has a 9th grade education.   Patient works in Architect (part-time).   Patient drinks one to two cups of coffee daily.   Patient is right-handed.         Per [facility] New Patient Packet:   Diet:Left blank      Caffeine:Yes       Married, if yes what year: Widowed      Do you live in a house, apartment, assisted living, condo, trailer, ect: House, 3 persons      Is it one or more stories: One      Pets: No      Current/Past profession: Interior and spatial designer      Highest level or education completed:       Exercise:         No         Type and how often:          Living Will: No   DNR: No   POA/HPOA: No      Functional Status:   Do you  have difficulty bathing or dressing yourself? No   Do you have difficulty preparing food or eating? No   Do you have difficulty managing your medications? No   Do you have difficulty managing your finances? No   Do you have difficulty affording your medications? No   Social Determinants of Radio broadcast assistant Strain: Not on file  Food Insecurity: Not on file  Transportation Needs: Not on file  Physical Activity: Not on file  Stress: Not on file  Social Connections: Not on file  Intimate Partner Violence: Not on file    Review of Systems  Respiratory:  Negative for cough, chest tightness and shortness of breath.   Cardiovascular:  Negative for chest pain and palpitations.  Neurological:  Negative for dizziness, light-headedness and headaches.    Objective:   Vitals:   10/19/20 0856  BP: 138/84  Pulse: 82  Temp: 97.6 F (36.4 C)  SpO2: 94%  Weight: 127 lb (57.6 kg)  Height: 5' 5"  (1.651 m)     Physical Exam Vitals reviewed.  Constitutional:      Appearance: He is well-developed.  HENT:     Head: Normocephalic and atraumatic.  Neck:     Vascular: No carotid bruit or JVD.  Cardiovascular:     Rate and Rhythm: Normal rate and regular rhythm.     Heart sounds: Normal heart sounds. No murmur heard. Pulmonary:     Effort: Pulmonary effort is normal.     Breath sounds: Normal breath sounds. No rales.  Musculoskeletal:     Right lower leg: No edema.     Left lower leg: No edema.  Skin:    General: Skin is warm and dry.  Neurological:     Mental  Status: He is alert and oriented to person, place, and time.  Psychiatric:        Mood and Affect: Mood normal.    Assessment & Plan:  Alexander [name] is a 75 y.o. male . Type 2 diabetes mellitus with other circulatory complications (HCC) - Plan: blood glucose meter kit and supplies  Hypoglycemia - Plan: blood glucose meter kit and supplies  Hypoglycemia, denies recent symptoms, hypoglycemia precautions discussed but seems to be doing better on lower dose glipizide.  Home blood sugar meter provided with handout provided.  Recheck 2 months, sooner if new concerns or changes.  Understanding expressed.    Meds ordered this encounter  Medications   blood glucose meter kit and supplies    Sig: Dispense based on patient and insurance preference. Once per day if symptoms of low blood sugar. Meter of choice per insurance coverage.    Dispense:  1 each    Refill:  0    Dx, E11.59    Order Specific Question:   Number of strips    Answer:   100    Order Specific Question:   Number of lancets    Answer:   100   Patient Instructions  Continue lower dose of glipizide. That should lessen risk of low blood sugars. See information below.if any return of low blood sugars, be seen right away.  Recheck diabetes in 2 months.  Return to the clinic or go to the nearest emergency room if any of your symptoms worsen or new symptoms occur.  Hypoglycemia Hypoglycemia occurs when the level of sugar (glucose) in the blood is too low. Hypoglycemia can happen in people who have or do not have diabetes. It can develop quickly, and it can be  a medical emergency. For most people, a blood glucose level below 70 mg/dL (3.9 mmol/L) is considered hypoglycemia. Glucose is a type of sugar that provides the body's main source of energy. Certain hormones (insulin and glucagon) control the level of glucose in the blood. Insulin lowers blood glucose, and glucagon raises blood glucose. Hypoglycemia can result from having too  much insulin in the bloodstream, or from not eating enough food that contains glucose. You may also have reactive hypoglycemia, which happens within 4 hours after eating a meal. What are the causes? Hypoglycemia occurs most often in people who have diabetes and may be caused by: Diabetes medicine. Not eating enough, or not eating often enough. Increased physical activity. Drinking alcohol on an empty stomach. If you do not have diabetes, hypoglycemia may be caused by: A tumor in the pancreas. Not eating enough, or not eating for long periods at a time (fasting). A severe infection or illness. Problems after having bariatric surgery. Organ failure, such as kidney or liver failure. Certain medicines. What increases the risk? Hypoglycemia is more likely to develop in people who: Have diabetes and take medicines to lower blood glucose. Abuse alcohol. Have a severe illness. What are the signs or symptoms? Symptoms vary depending on whether the condition is mild, moderate, or severe. Mild hypoglycemia Hunger. Sweating and feeling clammy. Dizziness or feeling light-headed. Sleepiness or restless sleep. Nausea. Increased heart rate. Headache. Blurry vision. Mood changes, such as irritability or anxiety. Tingling or numbness around the mouth, lips, or tongue. Moderate hypoglycemia Confusion and poor judgment. Behavior changes. Weakness. Irregular heartbeat. A change in coordination. Severe hypoglycemia Severe hypoglycemia is a medical emergency. It can cause: Fainting. Seizures. Loss of consciousness (coma). Death. How is this diagnosed? Hypoglycemia is diagnosed with a blood test to measure your blood glucose level. This blood test is done while you are having symptoms. Your health care provider may also do a physical exam and review your medical history. How is this treated? This condition can be treated by immediately eating or drinking something that contains sugar with 15  grams of fast-acting carbohydrate, such as: 4 oz (120 mL) of fruit juice. 4 oz (120 mL) of regular soda (not diet soda). Several pieces of hard candy. Check food labels to find out how many pieces to eat for 15 grams. 1 Tbsp (15 mL) of sugar or honey. 4 glucose tablets. 1 tube of glucose gel. Treating hypoglycemia if you have diabetes If you are alert and able to swallow safely, follow the 15:15 rule: Take 15 grams of a fast-acting carbohydrate. Talk with your health care provider about how much you should take. Options for getting 15 grams of fast-acting carbohydrate include: Glucose tablets (take 4 tablets). Several pieces of hard candy. Check food labels to find out how many pieces to eat for 15 grams. 4 oz (120 mL) of fruit juice. 4 oz (120 mL) of regular soda (not diet soda). 1 Tbsp (15 mL) of sugar or honey. 1 tube of glucose gel. Check your blood glucose 15 minutes after you take the carbohydrate. If the repeat blood glucose level is still at or below 70 mg/dL (3.9 mmol/L), take 15 grams of a carbohydrate again. If your blood glucose level does not increase above 70 mg/dL (3.9 mmol/L) after 3 tries, seek emergency medical care. After your blood glucose level returns to normal, eat a meal or a snack within 1 hour.  Treating severe hypoglycemia Severe hypoglycemia is when your blood glucose level is below 54  mg/dL (3 mmol/L). Severe hypoglycemia is a medical emergency. Get medical help right away. If you have severe hypoglycemia and you cannot eat or drink, you will need to be given glucagon. A family member or close friend should learn how to check your blood glucose and how to give you glucagon. Ask your health care provider if you need to have an emergency glucagon kit available. Severe hypoglycemia may need to be treated in a hospital. The treatment may include getting glucose through an IV. You may also need treatment for the cause of your hypoglycemia. Follow these instructions at  home: General instructions Take over-the-counter and prescription medicines only as told by your health care provider. Monitor your blood glucose as told by your health care provider. If you drink alcohol: Limit how much you have to: 0-1 drink a day for women who are not pregnant. 0-2 drinks a day for men. Know how much alcohol is in your drink. In the [place] one drink equals one 12 oz bottle of beer (355 mL), one 5 oz glass of wine (148 mL), or one 1 oz glass of hard liquor (44 mL). Be sure to eat food along with drinking alcohol. Be aware that alcohol is absorbed quickly and may have lingering effects that may result in hypoglycemia later. Be sure to do ongoing glucose monitoring. Keep all follow-up visits. This is important. If you have diabetes: Always have a fast-acting carbohydrate (15 grams) option with you to treat low blood glucose. Follow your diabetes management plan as directed by your health care provider. Make sure you: Know the symptoms of hypoglycemia. It is important to treat it right away to prevent it from becoming severe. Check your blood glucose as often as told. Always check before and after exercise. Always check your blood glucose before you drive a motorized vehicle. Take your medicines as told. Follow your meal plan. Eat on time, and do not skip meals. Share your diabetes management plan with people in your workplace, school, and household. Carry a medical alert card or wear medical alert jewelry. Where to find more information American Diabetes Association: www.diabetes.org Contact a health care provider if: You have problems keeping your blood glucose in your target range. You have frequent episodes of hypoglycemia. Get help right away if: You continue to have hypoglycemia symptoms after eating or drinking something that contains 15 grams of fast-acting carbohydrate, and you cannot get your blood glucose above 70 mg/dL (3.9 mmol/L) while following the 15:15  rule. Your blood glucose is below 54 mg/dL (3 mmol/L). You have a seizure. You faint. These symptoms may represent a serious problem that is an emergency. Do not wait to see if the symptoms will go away. Get medical help right away. Call your local emergency services (911 in the [place] Do not drive yourself to the hospital. Summary Hypoglycemia occurs when the level of sugar (glucose) in the blood is too low. Hypoglycemia can happen in people who have or do not have diabetes. It can develop quickly, and it can be a medical emergency. Make sure you know the symptoms of hypoglycemia and how to treat it. Always have a fast-acting carbohydrate option with you to treat low blood sugar. This information is not intended to replace advice given to you by your health care provider. Make sure you discuss any questions you have with your health care provider. Document Revised: 12/09/2019 Document Reviewed: 12/09/2019 Elsevier Patient Education  2022 [place],   Merri Ray,  MD [place], [facility] 10/19/20 9:26 AM

## 2020-10-24 ENCOUNTER — Other Ambulatory Visit: Payer: Self-pay | Admitting: Nurse Practitioner

## 2020-10-24 DIAGNOSIS — E1159 Type 2 diabetes mellitus with other circulatory complications: Secondary | ICD-10-CM

## 2020-10-30 ENCOUNTER — Encounter: Payer: Self-pay | Admitting: Registered Nurse

## 2020-10-30 ENCOUNTER — Other Ambulatory Visit: Payer: Self-pay

## 2020-10-30 ENCOUNTER — Other Ambulatory Visit (HOSPITAL_COMMUNITY): Payer: Self-pay

## 2020-10-30 ENCOUNTER — Ambulatory Visit (INDEPENDENT_AMBULATORY_CARE_PROVIDER_SITE_OTHER): Payer: Medicare Other | Admitting: Registered Nurse

## 2020-10-30 VITALS — BP 152/71 | HR 80 | Temp 98.2°F | Resp 18 | Ht 65.0 in

## 2020-10-30 DIAGNOSIS — E1159 Type 2 diabetes mellitus with other circulatory complications: Secondary | ICD-10-CM

## 2020-10-30 DIAGNOSIS — R634 Abnormal weight loss: Secondary | ICD-10-CM | POA: Diagnosis not present

## 2020-10-30 DIAGNOSIS — M542 Cervicalgia: Secondary | ICD-10-CM | POA: Diagnosis not present

## 2020-10-30 LAB — MICROALBUMIN / CREATININE URINE RATIO
Creatinine,U: 129.2 mg/dL
Microalb Creat Ratio: 1.6 mg/g (ref 0.0–30.0)
Microalb, Ur: 2.1 mg/dL — ABNORMAL HIGH (ref 0.0–1.9)

## 2020-10-30 MED ORDER — GLUCOSE BLOOD VI STRP
ORAL_STRIP | Freq: Every day | 0 refills | Status: DC
  Filled 2020-10-30: qty 100, 90d supply, fill #0

## 2020-10-30 MED ORDER — CYCLOBENZAPRINE HCL 5 MG PO TABS
5.0000 mg | ORAL_TABLET | Freq: Three times a day (TID) | ORAL | 1 refills | Status: DC | PRN
Start: 2020-10-30 — End: 2021-06-14
  Filled 2020-10-30: qty 30, 10d supply, fill #0
  Filled 2021-01-09: qty 30, 10d supply, fill #1

## 2020-10-30 MED ORDER — ACCU-CHEK SOFTCLIX LANCETS MISC
Freq: Every day | 0 refills | Status: DC
  Filled 2020-10-30: qty 100, 90d supply, fill #0

## 2020-10-30 MED ORDER — ACCU-CHEK GUIDE W/DEVICE KIT
PACK | Freq: Every day | 0 refills | Status: DC
  Filled 2020-10-30: qty 1, 1d supply, fill #0

## 2020-10-30 NOTE — Patient Instructions (Addendum)
Alexander [name] -   Doristine Devoid to meet you  Using a low dose of flexeril can help your neck relax a lot - ok to use up to three a day as needed Stretch 3-4 times a day for 10-15 minutes at a time.   I have ordered an xray to [place] at [facility]. They take walk ins.   Urine microalbumin collected today. Will be resulted tomorrow.  I am not too worried about weight loss. But let me know if it speeds up, worsens, changes, or new symptoms arise.  Thank you  Rich     If you have lab work done today you will be contacted with your lab results within the next 2 weeks.  If you have not heard from [place] then please contact us. The fastest way to get your results is to register for My Chart.   IF you received an x-ray today, you will receive an invoice from [facility]. Please contact [facility] at [phone] with questions or concerns regarding your invoice.   IF you received labwork today, you will receive an invoice from [facility]. Please contact [facility] at [phone] with questions or concerns regarding your invoice.   Our billing staff will not be able to assist you with questions regarding bills from these companies.  You will be contacted with the lab results as soon as they are available. The fastest way to get your results is to activate your My Chart account. Instructions are located on the last page of this paperwork. If you have not heard from [place] regarding the results in 2 weeks, please contact this office.

## 2020-10-30 NOTE — Progress Notes (Signed)
Established Patient Office Visit  Subjective:  Patient ID: Alexander [name], male    DOB: 01/15/1946  Age: 75 y.o. MRN: [id number]  CC:  Chief Complaint  Patient presents with   Pain    Patient states he has been experiencing some pain in his neck and shoulders. Patient states he took a half of a hydrocodone and gabapentin and seems to be helping. Pt wants to discuss weight as well.    HPI Alexander [name] presents for neck pain and weight  Neck pain Onset 4 days ago Extends towards shoulders Hurts to do any ROM in neck Has taken half a hydrocodone - good relief.   Weight loss Unintentional No changes to diet or exercise Has lost 10 lbs in last 2 years. 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 Will order today.  Otherwise no concerns.   Past Medical History:  Diagnosis Date   Angio-edema    CAD (coronary artery disease)    Per [facility] New Patient Packet    Carpal tunnel syndrome    Per [place] New Patient Packet    Diabetes mellitus without complication ([place])    Hypertension    Neuromuscular disorder ([place])    Peripheral arterial disease ([place]) 11/14/2016   non-occlusive, asymptomatic, seen by vascular surgery Dr. Oneida Alar who rec repeated ABIs annually   Prostate cancer [facility])     Past Surgical History:  Procedure Laterality Date   CATARACT EXTRACTION Right 08/23/2020   COLON SURGERY     "locked bowel" fixed   COLONOSCOPY  2019   HERNIA REPAIR     LEFT HEART CATHETERIZATION WITH CORONARY ANGIOGRAM N/A 09/15/2013   Procedure: LEFT HEART CATHETERIZATION WITH CORONARY ANGIOGRAM;  Surgeon: Peter M Martinique, MD;  Location: [facility] CATH LAB;  Service: Cardiovascular;  Laterality: N/A;   PROSTATE SURGERY  2005    Family History  Problem Relation Age of Onset   Diabetes Mother    Depression Father    Hypertension Father    Diabetes Father    Thyroid disease Father    Hypertension Sister    Diabetes Sister     Congestive Heart Failure Sister    Lung cancer Brother    Kidney failure Sister    Diabetes Sister    Breast cancer Neg Hx    Prostate cancer Neg Hx    Colon cancer Neg Hx     Social History   Socioeconomic History   Marital status: Widowed    Spouse name: Not on file   Number of children: 2   Years of education: 9   Highest education level: Not on file  Occupational History   Occupation: retired  Tobacco Use   Smoking status: Every Day    Packs/day: 1.00    Years: 55.00    Pack years: 55.00    Types: Cigarettes   Smokeless tobacco: Never  Vaping Use   Vaping Use: Never used  Substance and Sexual Activity   Alcohol use: No   Drug use: No   Sexual activity: Yes  Other Topics Concern   Not on file  Social History Narrative   Patient is single and his daughter lives with him.   Patient has two children.   Patient has a 9th grade education.   Patient works in Architect (part-time).   Patient drinks one to two cups of coffee daily.   Patient is right-handed.  Per [facility] New Patient Packet:   Diet:Left blank      Caffeine:Yes      Married, if yes what year: Widowed      Do you live in a house, apartment, assisted living, condo, trailer, ect: House, 3 persons      Is it one or more stories: One      Pets: No      Current/Past profession: Interior and spatial designer      Highest level or education completed:       Exercise:         No         Type and how often:          Living Will: No   DNR: No   POA/HPOA: No      Functional Status:   Do you have difficulty bathing or dressing yourself? No   Do you have difficulty preparing food or eating? No   Do you have difficulty managing your medications? No   Do you have difficulty managing your finances? No   Do you have difficulty affording your medications? No   Social Determinants of Radio broadcast assistant Strain: Not on file  Food Insecurity: Not on file  Transportation Needs: Not on file  Physical Activity:  Not on file  Stress: Not on file  Social Connections: Not on file  Intimate Partner Violence: Not on file    Outpatient Medications Prior to Visit  Medication Sig Dispense Refill   amLODipine (NORVASC) 10 MG tablet Take 1 tablet (10 mg total) by mouth daily. 90 tablet 1   aspirin EC 81 MG tablet Take 81 mg by mouth daily as needed for mild pain.      atorvastatin (LIPITOR) 40 MG tablet Take 40 mg by mouth daily.     blood glucose meter kit and supplies Dispense based on patient and insurance preference. Once per day if symptoms of low blood sugar. Meter of choice per insurance coverage. 1 each 0   Cholecalciferol (VITAMIN D3) 50 MCG (2000 UT) TABS Take by mouth.     diphenhydrAMINE (BENADRYL) 25 MG tablet Take 25 mg by mouth every 6 (six) hours as needed.     EPINEPHrine (EPIPEN 2-PAK) 0.3 mg/0.3 mL IJ SOAJ injection Inject 0.3 mLs (0.3 mg total) into the muscle as needed for anaphylaxis. 2 each 2   fexofenadine (ALLEGRA) 180 MG tablet Take 1 tablet (180 mg total) by mouth daily. 30 tablet 5   fluticasone (FLONASE) 50 MCG/ACT nasal spray Place 2 sprays into both nostrils daily. 16 g 6   gabapentin (NEURONTIN) 300 MG capsule Take 3 capsules (900 mg total) by mouth 2 (two) times daily. 540 capsule 3   glipiZIDE (GLUCOTROL) 5 MG tablet TAKE 1 TABLET BY MOUTH  TWICE DAILY BEFORE A MEAL 180 tablet 1   HYDROcodone-acetaminophen (NORCO/VICODIN) 5-325 MG tablet Take 1 tablet by mouth every 6 (six) hours as needed for moderate pain. 20 tablet 0   metFORMIN (GLUCOPHAGE) 1000 MG tablet Take 1 tablet (1,000 mg total) by mouth 2 (two) times daily with a meal. 180 tablet 1   montelukast (SINGULAIR) 10 MG tablet Take 1 tablet (10 mg total) by mouth daily. 90 tablet 3   moxifloxacin (VIGAMOX) 0.5 % ophthalmic solution Place 1 drop into the right eye (operative eye) 4 (four) times daily. Start after surgery 3 mL 0   sildenafil (VIAGRA) 100 MG tablet Take 100 mg by mouth as needed for erectile dysfunction.  triamcinolone cream (KENALOG) 0.1 % APPLY TOPICALLY TWICE DAILY 45 g 0   Na Sulfate-K Sulfate-Mg Sulf (SUPREP BOWEL PREP KIT) 17.5-3.13-1.6 GM/177ML SOLN Take as directed 354 mL 0   No facility-administered medications prior to visit.    Allergies  Allergen Reactions   Lisinopril Swelling    angioedema    ROS Review of Systems  Constitutional: Negative.   HENT: Negative.    Eyes: Negative.   Respiratory: Negative.    Cardiovascular: Negative.   Gastrointestinal: Negative.   Endocrine: Negative.   Genitourinary: Negative.   Musculoskeletal: Negative.   Skin: Negative.   Neurological: Negative.   Psychiatric/Behavioral: Negative.    All other systems reviewed and are negative.    Objective:    Physical Exam Constitutional:      General: He is not in acute distress.    Appearance: Normal appearance. He is normal weight. He is not ill-appearing, toxic-appearing or diaphoretic.  Neck:     Vascular: No carotid bruit.  Cardiovascular:     Rate and Rhythm: Normal rate and regular rhythm.     Heart sounds: Normal heart sounds. No murmur heard.   No friction rub. No gallop.  Pulmonary:     Effort: Pulmonary effort is normal. No respiratory distress.     Breath sounds: Normal breath sounds. No stridor. No wheezing, rhonchi or rales.  Chest:     Chest wall: No tenderness.  Musculoskeletal:        General: Tenderness (bilateral trapezius) present.     Cervical back: Rigidity and tenderness (posterior) present.  Lymphadenopathy:     Cervical: No cervical adenopathy.  Neurological:     General: No focal deficit present.     Mental Status: He is alert and oriented to person, place, and time. Mental status is at baseline.  Psychiatric:        Mood and Affect: Mood normal.        Behavior: Behavior normal.        Thought Content: Thought content normal.        Judgment: Judgment normal.    BP (!) 152/71   Pulse 80   Temp 98.2 F (36.8 C) (Temporal)   Resp 18   Ht 5'  5" (1.651 m)   SpO2 96%   BMI 21.13 kg/m  Wt Readings from Last 3 Encounters:  10/19/20 127 lb (57.6 kg)  09/11/20 130 lb 3.2 oz (59.1 kg)  05/10/20 135 lb (61.2 kg)     Health Maintenance Due  Topic Date Due   URINE MICROALBUMIN  10/25/2020    There are no preventive care reminders to display for this patient.  Lab Results  Component Value Date   TSH 1.750 05/06/2018   Lab Results  Component Value Date   WBC 5.8 05/10/2020   HGB 13.9 05/10/2020   HCT 42.3 05/10/2020   MCV 92.4 05/10/2020   PLT 265 05/10/2020   Lab Results  Component Value Date   NA 139 09/11/2020   K 4.2 09/11/2020   CO2 24 09/11/2020   GLUCOSE 48 (LL) 09/11/2020   BUN 14 09/11/2020   CREATININE 1.11 09/11/2020   BILITOT 0.5 09/11/2020   ALKPHOS 104 09/11/2020   AST 12 09/11/2020   ALT 8 09/11/2020   PROT 7.4 09/11/2020   ALBUMIN 4.3 09/11/2020   CALCIUM 9.7 09/11/2020   ANIONGAP 11 09/06/2013   GFR 65.43 09/11/2020   Lab Results  Component Value Date   CHOL 87 09/11/2020   Lab Results  Component  Value Date   HDL 32.90 (L) 09/11/2020   Lab Results  Component Value Date   LDLCALC 41 09/11/2020   Lab Results  Component Value Date   TRIG 67.0 09/11/2020   Lab Results  Component Value Date   CHOLHDL 3 09/11/2020   Lab Results  Component Value Date   HGBA1C 6.9 (H) 09/11/2020      Assessment & Plan:   Problem List Items Addressed This Visit       Cardiovascular and Mediastinum   Type 2 diabetes mellitus with other circulatory complications ([place])   Relevant Orders   Urine Microalbumin w/creat. ratio   Other Visit Diagnoses     Acute neck pain    -  Primary   Relevant Medications   cyclobenzaprine (FLEXERIL) 5 MG tablet   Other Relevant Orders   DG Cervical Spine Complete   Weight loss, unintentional           Meds ordered this encounter  Medications   cyclobenzaprine (FLEXERIL) 5 MG tablet    Sig: Take 1 tablet (5 mg total) by mouth 3 (three) times daily as  needed for muscle spasms.    Dispense:  30 tablet    Refill:  1    Order Specific Question:   Supervising Provider    Answer:   Carlota Raspberry, JEFFREY R [[id number]]    Follow-up: Return if symptoms worsen or fail to improve, for as scheduled with PCP.   PLAN Dg neck ordered. Will follow up as warranted Given flexeril as above. Advised on risks, benefits, AE. Urine micro collected today. Will follow up No red flags with weight loss. Likely secondary to chronic health concerns. Up to date on colonoscopy (2022) and lung CA screening (2022). Discussed calorie dense foods that are ok for t2dm and red flags/reasons to return Patient encouraged to call clinic with any questions, comments, or concerns.   Maximiano Coss, NP

## 2020-10-31 ENCOUNTER — Other Ambulatory Visit: Payer: Self-pay | Admitting: Nurse Practitioner

## 2020-10-31 DIAGNOSIS — E1159 Type 2 diabetes mellitus with other circulatory complications: Secondary | ICD-10-CM

## 2020-11-25 ENCOUNTER — Ambulatory Visit (HOSPITAL_COMMUNITY): Admit: 2020-11-25 | Discharge status: Against Medical Advice | Payer: Medicare Other

## 2020-11-28 ENCOUNTER — Ambulatory Visit: Payer: Medicare Other

## 2020-11-28 ENCOUNTER — Ambulatory Visit (HOSPITAL_COMMUNITY)
Admission: RE | Admit: 2020-11-28 | Discharge: 2020-11-28 | Disposition: A | Discharge status: Home / Self Care | Payer: Medicare Other | Source: Ambulatory Visit | Attending: Urgent Care | Admitting: Urgent Care

## 2020-11-28 ENCOUNTER — Ambulatory Visit (INDEPENDENT_AMBULATORY_CARE_PROVIDER_SITE_OTHER): Payer: Medicare Other

## 2020-11-28 ENCOUNTER — Other Ambulatory Visit: Payer: Self-pay

## 2020-11-28 ENCOUNTER — Encounter (HOSPITAL_COMMUNITY): Payer: Self-pay

## 2020-11-28 VITALS — BP 169/75 | HR 66 | Temp 98.1°F | Resp 18

## 2020-11-28 DIAGNOSIS — S93402A Sprain of unspecified ligament of left ankle, initial encounter: Secondary | ICD-10-CM

## 2020-11-28 DIAGNOSIS — M25572 Pain in left ankle and joints of left foot: Secondary | ICD-10-CM

## 2020-11-28 NOTE — ED Provider Notes (Signed)
[place]   MRN: [id number] DOB: 09/17/1945  Subjective:   Alexander [name] is a 75 y.o. male presenting for suffering a left ankle injury since last Thursday.  Patient accidentally twisted his left ankle from stepping in an awkward way over gravel.  He has since had left ankle pain, swelling, some difficulty with walking.  No current facility-administered medications for this encounter.  Current Outpatient Medications:    Accu-Chek Softclix Lancets lancets, Use once daily if symptoms of low blood sugar, Disp: 100 each, Rfl: 0   amLODipine (NORVASC) 10 MG tablet, Take 1 tablet (10 mg total) by mouth daily., Disp: 90 tablet, Rfl: 1   aspirin EC 81 MG tablet, Take 81 mg by mouth daily as needed for mild pain. , Disp: , Rfl:    atorvastatin (LIPITOR) 40 MG tablet, Take 40 mg by mouth daily., Disp: , Rfl:    blood glucose meter kit and supplies, Dispense based on patient and insurance preference. Once per day if symptoms of low blood sugar. Meter of choice per insurance coverage., Disp: 1 each, Rfl: 0   Blood Glucose Monitoring Suppl (ACCU-CHEK GUIDE) w/Device KIT, Use once daily if symptoms of low blood sugar, Disp: 1 kit, Rfl: 0   Cholecalciferol (VITAMIN D3) 50 MCG (2000 UT) TABS, Take by mouth., Disp: , Rfl:    cyclobenzaprine (FLEXERIL) 5 MG tablet, Take 1 tablet (5 mg total) by mouth 3 (three) times daily as needed for muscle spasms., Disp: 30 tablet, Rfl: 1   diphenhydrAMINE (BENADRYL) 25 MG tablet, Take 25 mg by mouth every 6 (six) hours as needed., Disp: , Rfl:    EPINEPHrine (EPIPEN 2-PAK) 0.3 mg/0.3 mL IJ SOAJ injection, Inject 0.3 mLs (0.3 mg total) into the muscle as needed for anaphylaxis., Disp: 2 each, Rfl: 2   fexofenadine (ALLEGRA) 180 MG tablet, Take 1 tablet (180 mg total) by mouth daily., Disp: 30 tablet, Rfl: 5   fluticasone (FLONASE) 50 MCG/ACT nasal spray, Place 2 sprays into both nostrils daily., Disp: 16 g, Rfl: 6   gabapentin (NEURONTIN) 300 MG  capsule, Take 3 capsules (900 mg total) by mouth 2 (two) times daily., Disp: 540 capsule, Rfl: 3   glipiZIDE (GLUCOTROL) 5 MG tablet, TAKE 1 TABLET BY MOUTH  TWICE DAILY BEFORE A MEAL, Disp: 180 tablet, Rfl: 1   glucose blood test strip, Use once daily if symptoms of low blood sugar, Disp: 100 each, Rfl: 0   HYDROcodone-acetaminophen (NORCO/VICODIN) 5-325 MG tablet, Take 1 tablet by mouth every 6 (six) hours as needed for moderate pain., Disp: 20 tablet, Rfl: 0   metFORMIN (GLUCOPHAGE) 1000 MG tablet, Take 1 tablet (1,000 mg total) by mouth 2 (two) times daily with a meal., Disp: 180 tablet, Rfl: 1   montelukast (SINGULAIR) 10 MG tablet, Take 1 tablet (10 mg total) by mouth daily., Disp: 90 tablet, Rfl: 3   moxifloxacin (VIGAMOX) 0.5 % ophthalmic solution, Place 1 drop into the right eye (operative eye) 4 (four) times daily. Start after surgery, Disp: 3 mL, Rfl: 0   sildenafil (VIAGRA) 100 MG tablet, Take 100 mg by mouth as needed for erectile dysfunction. , Disp: , Rfl:    triamcinolone cream (KENALOG) 0.1 %, APPLY TOPICALLY TWICE DAILY, Disp: 45 g, Rfl: 0   Allergies  Allergen Reactions   Lisinopril Swelling    angioedema    Past Medical History:  Diagnosis Date   Angio-edema    CAD (coronary artery disease)    Per [facility]  New Patient Packet    Carpal tunnel syndrome    Per [facility] New Patient Packet    Diabetes mellitus without complication ([place])    Hypertension    Neuromuscular disorder ([place])    Peripheral arterial disease ([place]) 11/14/2016   non-occlusive, asymptomatic, seen by vascular surgery Dr. Oneida Alar who rec repeated ABIs annually   Prostate cancer [facility])      Past Surgical History:  Procedure Laterality Date   CATARACT EXTRACTION Right 08/23/2020   COLON SURGERY     "locked bowel" fixed   COLONOSCOPY  2019   HERNIA REPAIR     LEFT HEART CATHETERIZATION WITH CORONARY ANGIOGRAM N/A 09/15/2013   Procedure: LEFT HEART CATHETERIZATION WITH CORONARY ANGIOGRAM;  Surgeon: Peter M  Martinique, MD;  Location: [facility] CATH LAB;  Service: Cardiovascular;  Laterality: N/A;   PROSTATE SURGERY  2005    Family History  Problem Relation Age of Onset   Diabetes Mother    Depression Father    Hypertension Father    Diabetes Father    Thyroid disease Father    Hypertension Sister    Diabetes Sister    Congestive Heart Failure Sister    Lung cancer Brother    Kidney failure Sister    Diabetes Sister    Breast cancer Neg Hx    Prostate cancer Neg Hx    Colon cancer Neg Hx     Social History   Tobacco Use   Smoking status: Every Day    Packs/day: 1.00    Years: 55.00    Pack years: 55.00    Types: Cigarettes   Smokeless tobacco: Never  Vaping Use   Vaping Use: Never used  Substance Use Topics   Alcohol use: No   Drug use: No    ROS   Objective:   Vitals: BP (!) 169/75 (BP Location: Left Arm)   Pulse 66   Temp 98.1 F (36.7 C) (Oral)   Resp 18   SpO2 97%   Physical Exam Constitutional:      General: He is not in acute distress.    Appearance: Normal appearance. He is well-developed and normal weight. He is not ill-appearing, toxic-appearing or diaphoretic.  HENT:     Head: Normocephalic and atraumatic.     Right Ear: External ear normal.     Left Ear: External ear normal.     Nose: Nose normal.     Mouth/Throat:     Pharynx: Oropharynx is clear.  Eyes:     General: No scleral icterus.       Right eye: No discharge.        Left eye: No discharge.     Extraocular Movements: Extraocular movements intact.     Pupils: Pupils are equal, round, and reactive to light.  Cardiovascular:     Rate and Rhythm: Normal rate.  Pulmonary:     Effort: Pulmonary effort is normal.  Musculoskeletal:     Cervical back: Normal range of motion.     Left ankle: Swelling present. No deformity, ecchymosis or lacerations. Tenderness present over the lateral malleolus, ATF ligament and AITF ligament. No medial malleolus, CF ligament, posterior TF ligament, base of 5th  metatarsal or proximal fibula tenderness. Normal range of motion.     Left Achilles Tendon: No tenderness or defects. Thompson's test negative.  Neurological:     Mental Status: He is alert and oriented to person, place, and time.  Psychiatric:        Mood and Affect: Mood  normal.        Behavior: Behavior normal.        Thought Content: Thought content normal.        Judgment: Judgment normal.    DG Ankle Complete Left  Result Date: 11/28/2020 CLINICAL DATA:  LEFT ankle pain and swelling since last Thursday in a 75 year old male. EXAM: LEFT ANKLE COMPLETE - 3+ VIEW COMPARISON:  None FINDINGS: Soft tissue swelling about the LEFT ankle. No sign of fracture or dislocation. Ankle mortise appears unremarkable. Degenerative changes about the ankle are mild and there is mild enthesopathy upon the calcaneus. IMPRESSION: Soft tissue swelling without acute fracture or dislocation. Electronically Signed   By: Zetta Bills M.D.   On: 11/28/2020 10:52    Left ankle wrapped using 4" Ace wrap in figure-8 method.   Assessment and Plan :   PDMP not reviewed this encounter.  1. Acute left ankle pain   2. Sprain of left ankle, unspecified ligament, initial encounter    Will manage for ankle sprain with rice method, APAP. Counseled patient on potential for adverse effects with medications prescribed/recommended today, ER and return-to-clinic precautions discussed, patient verbalized understanding.    Jaynee Eagles, PA-C 11/28/20 1103

## 2020-11-28 NOTE — ED Triage Notes (Signed)
Pt states he twisted his lt ankle last Thursday and now having swelling to lt foot. Denies pain.

## 2020-12-21 ENCOUNTER — Ambulatory Visit (INDEPENDENT_AMBULATORY_CARE_PROVIDER_SITE_OTHER): Payer: Medicare Other | Admitting: Family Medicine

## 2020-12-21 ENCOUNTER — Encounter: Payer: Self-pay | Admitting: Family Medicine

## 2020-12-21 VITALS — BP 132/70 | HR 76 | Temp 97.8°F | Resp 16 | Ht 65.0 in | Wt 131.8 lb

## 2020-12-21 DIAGNOSIS — E1159 Type 2 diabetes mellitus with other circulatory complications: Secondary | ICD-10-CM | POA: Diagnosis not present

## 2020-12-21 LAB — POCT GLYCOSYLATED HEMOGLOBIN (HGB A1C): Hemoglobin A1C: 6.6 % — AB (ref 4.0–5.6)

## 2020-12-21 LAB — GLUCOSE, POCT (MANUAL RESULT ENTRY): POC Glucose: 173 mg/dl — AB (ref 70–99)

## 2020-12-21 NOTE — Patient Instructions (Addendum)
Check your home readings. If any low readings, stop glipizide. Continue metformin same dose for now.   See precautions for blood sugar below. Please let me know if there are questions. Fasting labs and cholesterol at next visit in 3 months.   Preventing Hypoglycemia Hypoglycemia occurs when the level of sugar (glucose) in the blood is too low. Hypoglycemia can happen in people who do or do not have diabetes (diabetes mellitus). It can develop quickly, and it can be a medical emergency. For most people with diabetes, a blood glucose level below 70 mg/dL (3.9 mmol/L) is considered hypoglycemia. Glucose is a type of sugar that provides the body's main source of energy. Certain hormones (insulin and glucagon) control the level of glucose in the blood. Insulin lowers blood glucose, and glucagon increases blood glucose. Hypoglycemia can result from having too much insulin in the bloodstream, or from not eating enough food that contains glucose. Your risk for hypoglycemia is higher: If you take insulin or diabetes medicines to help lower your blood glucose or to help your body make more insulin. If you skip or delay a meal or snack. If you are ill. During and after exercise. You can prevent hypoglycemia by working with your health care provider to adjust your meal plan as needed and by taking other precautions. How can hypoglycemia affect me? Mild symptoms Mild hypoglycemia may not cause any symptoms. If you do have symptoms, they may include: Hunger. Sweating and feeling clammy. Dizziness or feeling light-headed. Sleepiness or restless sleep. Nausea. Increased heart rate. Headache. Blurry vision. Mood changes, including irritability or anxiety. Tingling or numbness around the mouth, lips, or tongue. If mild hypoglycemia is not recognized and treated, it can quickly become moderate or severe hypoglycemia. Moderate symptoms Moderate hypoglycemia can cause: Confusion and poor judgment. Behavior  changes. Weakness. Irregular heartbeat. A change in coordination. Severe symptoms Severe hypoglycemia is a medical emergency. It can cause: Fainting. Seizures. Loss of consciousness (coma). Death. What nutrition changes can be made? Work with your health care provider or dietitian to make a healthy meal plan that is right for you. Follow your meal plan carefully. Eat meals at regular times. If recommended by your health care provider, have snacks between meals. Donot skip or delay meals or snacks. You can be at risk for hypoglycemia if you are not getting enough carbohydrates. What lifestyle changes can be made?  Work closely with your health care provider to manage your blood glucose. Make sure you know: Your goal blood glucose levels. How and when to check your blood glucose. The symptoms of hypoglycemia. It is important to treat hypoglycemia right away to keep it from becoming severe. Do not drink alcohol on an empty stomach. When you are ill, check your blood glucose more often than usual. Make a sick day plan in advance with your health care provider. Follow this plan whenever you cannot eat or drink normally. Always check your blood glucose before, during, and after exercise. How is this treated? This condition can often be treated by immediately eating or drinking something that contains sugar with 15 grams of fast-acting carbohydrate, such as: 4 oz (120 mL) of fruit juice. 4 oz (120 mL) of regular soda (not diet soda). Several pieces of hard candy. Check food labels to find out how many pieces to eat for 15 grams. 1 Tbsp (15 mL) of sugar or honey. 4 glucose tablets. 1 tube of glucose gel. Treating hypoglycemia if you have diabetes If you are alert and able  to swallow safely, follow the 15:15 rule: Take 15 grams of a fast-acting carbohydrate. Talk with your health care provider about how much you should take. Fast-acting options include: Glucose tablets (take 4  tablets). Several pieces of hard candy. Check food labels to find out how many pieces to eat for 15 grams. 4 oz (120 mL) of fruit juice. 4 oz (120 mL) of regular soda (not diet soda). 1 Tbsp (15 mL) of sugar or honey. 1 tube of glucose gel. Check your blood glucose 15 minutes after you take the carbohydrate. If the repeat blood glucose level is still at or below 70 mg/dL (3.9 mmol/L), take 15 grams of a carbohydrate again. If your blood glucose level does not increase above 70 mg/dL (3.9 mmol/L) after 3 tries, seek emergency medical care. After your blood glucose level returns to normal, eat a meal or a snack within 1 hour. Treating severe hypoglycemia Severe hypoglycemia is when your blood glucose level is below 54 mg/dL (3 mmol/L). Severe hypoglycemia is a medical emergency. Get medical help right away. If you have severe hypoglycemia and you cannot eat or drink, you may need glucagon. A family member or close friend should learn how to check your blood glucose and how to give you glucagon. Ask your health care provider if you need to have an emergency glucagon kit available. Severe hypoglycemia may need to be treated in a hospital. The treatment may include getting glucose through an IV. You may also need treatment for the cause of your hypoglycemia. Where to find more information American Diabetes Association: www.diabetes.[employer] of Diabetes and Digestive and Kidney Diseases: [employer] Association of Diabetes Care & Education Specialists: www.diabeteseducator.org Contact a health care provider if: You have problems keeping your blood glucose in your target range. You have frequent episodes of hypoglycemia. Get help right away if: You continue to have hypoglycemia symptoms after eating or drinking something containing glucose. Your blood glucose level is below 54 mg/dL (3 mmol/L). You faint. You have a seizure. These symptoms may represent a serious problem that  is an emergency. Do not wait to see if the symptoms will go away. Get medical help right away. Call your local emergency services (911 in the [place] Do not drive yourself to the hospital. Summary Know the symptoms of hypoglycemia and when you are at risk for it, such as during exercise or when you are sick. Check your blood glucose often when you are at risk for hypoglycemia. Hypoglycemia can develop quickly, and it can be dangerous if it is not treated right away. If you have a history of severe hypoglycemia, make sure your family or a close friend knows how to use your glucagon kit. Make sure you know how to treat hypoglycemia. Keep a fast-acting carbohydrate option available when you may be at risk for hypoglycemia. This information is not intended to replace advice given to you by your health care provider. Make sure you discuss any questions you have with your health care provider. Document Revised: 12/09/2019 Document Reviewed: 12/09/2019 Elsevier Patient Education  2022 [employer].

## 2020-12-21 NOTE — Progress Notes (Signed)
Subjective:  Patient ID: Alexander [name], male    DOB: [birth year]  Age: 75 y.o. MRN: [id number]  CC:  Chief Complaint  Patient presents with   Diabetes    Pt reports not using monitor for his BG, offered lessons if needed to better compliance    HPI Alexander [name] presents for   Diabetes: Complicated by hypoglycemia.  Did have a glucose of 48 previously on labs.  Had been noticed to be sluggish at times.  His glipizide was decreased to 2.5 mg twice daily.  Was improving at his last visit September 29 without hypoglycemia symptoms.  He has not been checking his home readings. Denies recent low blood sugar readings. Still on 1/2 glipizide BID. Metformin 1058m BID.  Ate prior to office visit.   Lab Results  Component Value Date   HGBA1C 6.6 (A) 12/21/2020   HGBA1C 6.9 (H) 09/11/2020   HGBA1C 6.6 (H) 05/10/2020   Lab Results  Component Value Date   MICROALBUR 2.1 (H) 10/30/2020   LDLCALC 41 09/11/2020   CREATININE 1.11 09/11/2020   Results for orders placed or performed in visit on 12/21/20  POCT glucose (manual entry)  Result Value Ref Range   POC Glucose 173 (A) 70 - 99 mg/dl  POCT glycosylated hemoglobin (Hb A1C)  Result Value Ref Range   Hemoglobin A1C 6.6 (A) 4.0 - 5.6 %   HbA1c POC (<> result, manual entry)     HbA1c, POC (prediabetic range)     HbA1c, POC (controlled diabetic range)       History Patient Active Problem List   Diagnosis Date Noted   Right carpal tunnel syndrome 06/08/2019   Malignant neoplasm of prostate ([facility] 10/27/2018   Allergic reaction 08/10/2018   Angioedema 08/10/2018   Peripheral arterial occlusive disease ([facility] 10/28/2016   Hyperlipidemia LDL goal <70 05/01/2015   Tobacco use disorder 12/09/2013   Coronary artery disease involving native coronary artery of native heart without angina pectoris 12/09/2013   Erectile dysfunction due to diseases classified elsewhere 12/09/2013   Type 2 diabetes mellitus with other circulatory  complications ([place] 126/71/2458  Left lumbar radiculopathy 09/30/2012   Chronic rhinitis 04/08/2011   Hypertension    HTN (hypertension), benign 03/09/2011   History of prostate cancer 03/09/2011   Past Medical History:  Diagnosis Date   Angio-edema    CAD (coronary artery disease)    Per [facility]    Carpal tunnel syndrome    [place]    Diabetes mellitus without complication ([facility]    Hypertension    Neuromuscular disorder ([facility]    Peripheral arterial disease ([place] 11/14/2016   non-occlusive, asymptomatic, seen by vascular surgery Dr. FOneida Alarwho rec repeated ABIs annually   Prostate cancer ([facility]    Past Surgical History:  Procedure Laterality Date   CATARACT EXTRACTION Right 08/23/2020   COLON SURGERY     "locked bowel" fixed   COLONOSCOPY  2019   HERNIA REPAIR     LEFT HEART CATHETERIZATION WITH CORONARY ANGIOGRAM N/A 09/15/2013   Procedure: LEFT HEART CATHETERIZATION WITH CORONARY ANGIOGRAM;  Surgeon: Peter M JMartinique MD;  [facility];  Service: Cardiovascular;  Laterality: N/A;   PROSTATE SURGERY  2005   Allergies  Allergen Reactions   Lisinopril Swelling    angioedema   Prior to Admission medications   Medication Sig Start Date End Date Taking? Authorizing Provider  Accu-Chek Softclix Lancets lancets Use once daily if symptoms of low  blood sugar 10/19/20  Yes Wendie Agreste, MD  amLODipine (NORVASC) 10 MG tablet Take 1 tablet (10 mg total) by mouth daily. 09/11/20  Yes Wendie Agreste, MD  aspirin EC 81 MG tablet Take 81 mg by mouth daily as needed for mild pain.    Yes [provider]  atorvastatin (LIPITOR) 40 MG tablet Take 40 mg by mouth daily.   Yes [provider]  blood glucose meter kit and supplies Dispense based on patient and insurance preference. Once per day if symptoms of low blood sugar. Meter of choice per insurance coverage. 10/19/20  Yes Wendie Agreste, MD  Blood Glucose Monitoring Suppl  (ACCU-CHEK GUIDE) w/Device KIT Use once daily if symptoms of low blood sugar 10/19/20  Yes Wendie Agreste, MD  Cholecalciferol (VITAMIN D3) 50 MCG (2000 UT) TABS Take by mouth.   Yes [provider]  cyclobenzaprine (FLEXERIL) 5 MG tablet Take 1 tablet (5 mg total) by mouth 3 (three) times daily as needed for muscle spasms. 10/30/20  Yes Maximiano Coss, NP  diphenhydrAMINE (BENADRYL) 25 MG tablet Take 25 mg by mouth every 6 (six) hours as needed.   Yes [provider]  EPINEPHrine (EPIPEN 2-PAK) 0.3 mg/0.3 mL IJ SOAJ injection Inject 0.3 mLs (0.3 mg total) into the muscle as needed for anaphylaxis. 08/10/18  Yes Bobbitt, [place] Muta, MD  fexofenadine (ALLEGRA) 180 MG tablet Take 1 tablet (180 mg total) by mouth daily. 08/10/18  Yes Bobbitt, [place] Muta, MD  fluticasone [facility]) 50 MCG/ACT nasal spray Place 2 sprays into both nostrils daily. 03/18/19  Yes Jacelyn Pi, Lilia Argue, MD  gabapentin (NEURONTIN) 300 MG capsule Take 3 capsules (900 mg total) by mouth 2 (two) times daily. 09/11/20  Yes Wendie Agreste, MD  glipiZIDE (GLUCOTROL) 5 MG tablet TAKE 1 TABLET BY MOUTH  TWICE DAILY BEFORE A MEAL 09/11/20  Yes Wendie Agreste, MD  glucose blood test strip Use once daily if symptoms of low blood sugar 10/19/20  Yes   HYDROcodone-acetaminophen (NORCO/VICODIN) 5-325 MG tablet Take 1 tablet by mouth every 6 (six) hours as needed for moderate pain. 09/11/20  Yes Wendie Agreste, MD  metFORMIN (GLUCOPHAGE) 1000 MG tablet Take 1 tablet (1,000 mg total) by mouth 2 (two) times daily with a meal. 06/08/20  Yes Eubanks, Carlos American, NP  montelukast (SINGULAIR) 10 MG tablet Take 1 tablet (10 mg total) by mouth daily. 09/11/20  Yes Wendie Agreste, MD  moxifloxacin (VIGAMOX) 0.5 % ophthalmic solution Place 1 drop into the right eye (operative eye) 4 (four) times daily. Start after surgery 08/08/20  Yes   sildenafil (VIAGRA) 100 MG tablet Take 100 mg by mouth as needed for erectile dysfunction.     Yes [provider]  triamcinolone cream (KENALOG) 0.1 % APPLY TOPICALLY TWICE DAILY 09/11/20  Yes Wendie Agreste, MD   Social History   Socioeconomic History   Marital status: Widowed    Spouse name: Not on file   Number of children: 2   Years of education: 9   Highest education level: Not on file  Occupational History   Occupation: retired  Tobacco Use   Smoking status: Every Day    Packs/day: 1.00    Years: 55.00    Pack years: 55.00    Types: Cigarettes   Smokeless tobacco: Never  Vaping Use   Vaping Use: Never used  Substance and Sexual Activity   Alcohol use: No   Drug use: No  Sexual activity: Yes  Other Topics Concern   Not on file  Social History Narrative   Patient is single and his daughter lives with him.   Patient has two children.   Patient has a 9th grade education.   Patient works in Architect (part-time).   Patient drinks one to two cups of coffee daily.   Patient is right-handed.         Per [facility] New Patient Packet:   Diet:Left blank      Caffeine:Yes      Married, if yes what year: Widowed      Do you live in a house, apartment, assisted living, condo, trailer, ect: House, 3 persons      Is it one or more stories: One      Pets: No      Current/Past profession: Interior and spatial designer      Highest level or education completed:       Exercise:         No         Type and how often:          Living Will: No   DNR: No   POA/HPOA: No      Functional Status:   Do you have difficulty bathing or dressing yourself? No   Do you have difficulty preparing food or eating? No   Do you have difficulty managing your medications? No   Do you have difficulty managing your finances? No   Do you have difficulty affording your medications? No   Social Determinants of Radio broadcast assistant Strain: Not on file  Food Insecurity: Not on file  Transportation Needs: Not on file  Physical Activity: Not on file  Stress: Not on file  Social  Connections: Not on file  Intimate Partner Violence: Not on file    Review of Systems  Constitutional:  Negative for fatigue and unexpected weight change.  Eyes:  Negative for visual disturbance.  Respiratory:  Negative for cough, chest tightness and shortness of breath.   Cardiovascular:  Negative for chest pain, palpitations and leg swelling.  Gastrointestinal:  Negative for abdominal pain and blood in stool.  Neurological:  Negative for dizziness, light-headedness and headaches.    Objective:   Vitals:   12/21/20 1131  BP: 132/70  Pulse: 76  Resp: 16  Temp: 97.8 F (36.6 C)  TempSrc: Temporal  SpO2: 96%  Weight: 131 lb 12.8 oz (59.8 kg)  Height: _0  (1.651 m)     Physical Exam Vitals reviewed.  Constitutional:      Appearance: He is well-developed.  HENT:     Head: Normocephalic and atraumatic.  Neck:     Vascular: No carotid bruit or JVD.  Cardiovascular:     Rate and Rhythm: Normal rate and regular rhythm.     Heart sounds: Normal heart sounds. No murmur heard. Pulmonary:     Effort: Pulmonary effort is normal.     Breath sounds: Normal breath sounds. No rales.  Musculoskeletal:     Right lower leg: No edema.     Left lower leg: No edema.  Skin:    General: Skin is warm and dry.  Neurological:     Mental Status: He is alert and oriented to person, place, and time.  Psychiatric:        Mood and Affect: Mood normal.       Assessment & Plan:  Alexander [name] is a 75 y.o. male . Type 2 diabetes mellitus  with other circulatory complications ([place]) - Plan: POCT glucose (manual entry), POCT glycosylated hemoglobin (Hb A1C) Still well controlled.  Denies hypoglycemic events.  However he has not been monitoring home readings.  Does have monitor, plans on checking home readings.  Hypoglycemia prevention discussed and handout given.  If any further low readings would stop glipizide given his current control and age.,  And remain on just metformin.   Recheck 3  months with fasting labs.   No orders of the defined types were placed in this encounter.  Patient Instructions  Check your home readings. If any low readings, stop glipizide. Continue metformin same dose for now.   See precautions for blood sugar below. Please let me know if there are questions. Fasting labs and cholesterol at next visit in 3 months.   Preventing Hypoglycemia Hypoglycemia occurs when the level of sugar (glucose) in the blood is too low. Hypoglycemia can happen in people who do or do not have diabetes (diabetes mellitus). It can develop quickly, and it can be a medical emergency. For most people with diabetes, a blood glucose level below 70 mg/dL (3.9 mmol/L) is considered hypoglycemia. Glucose is a type of sugar that provides the body's main source of energy. Certain hormones (insulin and glucagon) control the level of glucose in the blood. Insulin lowers blood glucose, and glucagon increases blood glucose. Hypoglycemia can result from having too much insulin in the bloodstream, or from not eating enough food that contains glucose. Your risk for hypoglycemia is higher: If you take insulin or diabetes medicines to help lower your blood glucose or to help your body make more insulin. If you skip or delay a meal or snack. If you are ill. During and after exercise. You can prevent hypoglycemia by working with your health care provider to adjust your meal plan as needed and by taking other precautions. How can hypoglycemia affect me? Mild symptoms Mild hypoglycemia may not cause any symptoms. If you do have symptoms, they may include: Hunger. Sweating and feeling clammy. Dizziness or feeling light-headed. Sleepiness or restless sleep. Nausea. Increased heart rate. Headache. Blurry vision. Mood changes, including irritability or anxiety. Tingling or numbness around the mouth, lips, or tongue. If mild hypoglycemia is not recognized and treated, it can quickly become moderate  or severe hypoglycemia. Moderate symptoms Moderate hypoglycemia can cause: Confusion and poor judgment. Behavior changes. Weakness. Irregular heartbeat. A change in coordination. Severe symptoms Severe hypoglycemia is a medical emergency. It can cause: Fainting. Seizures. Loss of consciousness (coma). Death. What nutrition changes can be made? Work with your health care provider or dietitian to make a healthy meal plan that is right for you. Follow your meal plan carefully. Eat meals at regular times. If recommended by your health care provider, have snacks between meals. Donot skip or delay meals or snacks. You can be at risk for hypoglycemia if you are not getting enough carbohydrates. What lifestyle changes can be made?  Work closely with your health care provider to manage your blood glucose. Make sure you know: Your goal blood glucose levels. How and when to check your blood glucose. The symptoms of hypoglycemia. It is important to treat hypoglycemia right away to keep it from becoming severe. Do not drink alcohol on an empty stomach. When you are ill, check your blood glucose more often than usual. Make a sick day plan in advance with your health care provider. Follow this plan whenever you cannot eat or drink normally. Always check your blood glucose before,  during, and after exercise. How is this treated? This condition can often be treated by immediately eating or drinking something that contains sugar with 15 grams of fast-acting carbohydrate, such as: 4 oz (120 mL) of fruit juice. 4 oz (120 mL) of regular soda (not diet soda). Several pieces of hard candy. Check food labels to find out how many pieces to eat for 15 grams. 1 Tbsp (15 mL) of sugar or honey. 4 glucose tablets. 1 tube of glucose gel. Treating hypoglycemia if you have diabetes If you are alert and able to swallow safely, follow the 15:15 rule: Take 15 grams of a fast-acting carbohydrate. Talk with your  health care provider about how much you should take. Fast-acting options include: Glucose tablets (take 4 tablets). Several pieces of hard candy. Check food labels to find out how many pieces to eat for 15 grams. 4 oz (120 mL) of fruit juice. 4 oz (120 mL) of regular soda (not diet soda). 1 Tbsp (15 mL) of sugar or honey. 1 tube of glucose gel. Check your blood glucose 15 minutes after you take the carbohydrate. If the repeat blood glucose level is still at or below 70 mg/dL (3.9 mmol/L), take 15 grams of a carbohydrate again. If your blood glucose level does not increase above 70 mg/dL (3.9 mmol/L) after 3 tries, seek emergency medical care. After your blood glucose level returns to normal, eat a meal or a snack within 1 hour. Treating severe hypoglycemia Severe hypoglycemia is when your blood glucose level is below 54 mg/dL (3 mmol/L). Severe hypoglycemia is a medical emergency. Get medical help right away. If you have severe hypoglycemia and you cannot eat or drink, you may need glucagon. A family member or close friend should learn how to check your blood glucose and how to give you glucagon. Ask your health care provider if you need to have an emergency glucagon kit available. Severe hypoglycemia may need to be treated in a hospital. The treatment may include getting glucose through an IV. You may also need treatment for the cause of your hypoglycemia. Where to find more information American Diabetes Association: www.diabetes.[employer] of Diabetes and Digestive and Kidney Diseases: [employer] Association of Diabetes Care & Education Specialists: www.diabeteseducator.org Contact a health care provider if: You have problems keeping your blood glucose in your target range. You have frequent episodes of hypoglycemia. Get help right away if: You continue to have hypoglycemia symptoms after eating or drinking something containing glucose. Your blood glucose level is below  54 mg/dL (3 mmol/L). You faint. You have a seizure. These symptoms may represent a serious problem that is an emergency. Do not wait to see if the symptoms will go away. Get medical help right away. Call your local emergency services (911 in the [place] Do not drive yourself to the hospital. Summary Know the symptoms of hypoglycemia and when you are at risk for it, such as during exercise or when you are sick. Check your blood glucose often when you are at risk for hypoglycemia. Hypoglycemia can develop quickly, and it can be dangerous if it is not treated right away. If you have a history of severe hypoglycemia, make sure your family or a close friend knows how to use your glucagon kit. Make sure you know how to treat hypoglycemia. Keep a fast-acting carbohydrate option available when you may be at risk for hypoglycemia. This information is not intended to replace advice given to you by your health care provider. Make sure you  discuss any questions you have with your health care provider. Document Revised: 12/09/2019 Document Reviewed: 12/09/2019 Elsevier Patient Education  2022 [place],   Merri Ray, MD [place] 12/21/20 1:02 PM

## 2021-01-10 ENCOUNTER — Other Ambulatory Visit (HOSPITAL_COMMUNITY): Payer: Self-pay

## 2021-01-25 ENCOUNTER — Other Ambulatory Visit: Payer: Self-pay | Admitting: Family Medicine

## 2021-01-25 DIAGNOSIS — I10 Essential (primary) hypertension: Secondary | ICD-10-CM

## 2021-01-25 DIAGNOSIS — E1159 Type 2 diabetes mellitus with other circulatory complications: Secondary | ICD-10-CM

## 2021-02-01 ENCOUNTER — Telehealth: Payer: Self-pay | Admitting: Family Medicine

## 2021-02-01 MED ORDER — ATORVASTATIN CALCIUM 40 MG PO TABS: 40.0000 mg | ORAL_TABLET | Freq: Every day | ORAL | 1 refills | Status: DC

## 2021-02-01 NOTE — Telephone Encounter (Signed)
Pt  daughter called in asking for the Atorvastating to be sent [facility] in a 90 day supply.  Please advise

## 2021-02-01 NOTE — Telephone Encounter (Signed)
Labs ordered in August, Lipitor refilled.  Has appointment with me in the next few months.

## 2021-02-07 ENCOUNTER — Other Ambulatory Visit (HOSPITAL_COMMUNITY): Payer: Self-pay

## 2021-02-07 MED ORDER — SODIUM FLUORIDE 5000 SENSITIVE 1.1-5 % DT GEL
DENTAL | 2 refills | Status: DC
  Filled 2021-02-07: qty 100, 30d supply, fill #0

## 2021-03-23 ENCOUNTER — Ambulatory Visit (INDEPENDENT_AMBULATORY_CARE_PROVIDER_SITE_OTHER): Payer: Medicare Other | Admitting: Family Medicine

## 2021-03-23 ENCOUNTER — Other Ambulatory Visit (HOSPITAL_COMMUNITY): Payer: Self-pay

## 2021-03-23 VITALS — BP 138/80 | HR 71 | Temp 98.0°F | Resp 15 | Ht 65.0 in | Wt 134.8 lb

## 2021-03-23 DIAGNOSIS — I10 Essential (primary) hypertension: Secondary | ICD-10-CM

## 2021-03-23 DIAGNOSIS — I999 Unspecified disorder of circulatory system: Secondary | ICD-10-CM

## 2021-03-23 DIAGNOSIS — E1159 Type 2 diabetes mellitus with other circulatory complications: Secondary | ICD-10-CM

## 2021-03-23 DIAGNOSIS — M5416 Radiculopathy, lumbar region: Secondary | ICD-10-CM

## 2021-03-23 DIAGNOSIS — E785 Hyperlipidemia, unspecified: Secondary | ICD-10-CM

## 2021-03-23 LAB — COMPREHENSIVE METABOLIC PANEL
ALT: 9 U/L (ref 0–53)
AST: 15 U/L (ref 0–37)
Albumin: 4.3 g/dL (ref 3.5–5.2)
Alkaline Phosphatase: 126 U/L — ABNORMAL HIGH (ref 39–117)
BUN: 13 mg/dL (ref 6–23)
CO2: 27 mEq/L (ref 19–32)
Calcium: 9.7 mg/dL (ref 8.4–10.5)
Chloride: 103 mEq/L (ref 96–112)
Creatinine, Ser: 0.97 mg/dL (ref 0.40–1.50)
GFR: 76.63 mL/min (ref >60.00)
Glucose, Bld: 87 mg/dL (ref 70–99)
Potassium: 4.4 mEq/L (ref 3.5–5.1)
Sodium: 137 mEq/L (ref 135–145)
Total Bilirubin: 0.5 mg/dL (ref 0.2–1.2)
Total Protein: 7.6 g/dL (ref 6.0–8.3)

## 2021-03-23 LAB — LIPID PANEL
Cholesterol: 96 mg/dL (ref 0–200)
HDL: 38.4 mg/dL — ABNORMAL LOW (ref >39.00)
LDL Cholesterol: 47 mg/dL (ref 0–99)
NonHDL: 58.03
Total CHOL/HDL Ratio: 3
Triglycerides: 57 mg/dL (ref 0.0–149.0)
VLDL: 11.4 mg/dL (ref 0.0–40.0)

## 2021-03-23 LAB — HEMOGLOBIN A1C: Hgb A1c MFr Bld: 7.6 % — ABNORMAL HIGH (ref 4.6–6.5)

## 2021-03-23 MED ORDER — HYDROCODONE-ACETAMINOPHEN 5-325 MG PO TABS
1.0000 | ORAL_TABLET | Freq: Four times a day (QID) | ORAL | 0 refills | Status: DC | PRN
  Filled 2021-03-23: qty 20, 5d supply, fill #0

## 2021-03-23 MED ORDER — METFORMIN HCL 1000 MG PO TABS
1000.0000 mg | ORAL_TABLET | Freq: Two times a day (BID) | ORAL | 1 refills | Status: DC
  Filled 2021-03-23: qty 180, 90d supply, fill #0

## 2021-03-23 MED ORDER — GLIPIZIDE 5 MG PO TABS
2.5000 mg | ORAL_TABLET | Freq: Two times a day (BID) | ORAL | 3 refills | Status: DC
  Filled 2021-03-23: qty 90, 90d supply, fill #0

## 2021-03-23 NOTE — Progress Notes (Signed)
Subjective:  Patient ID: Alexander [name], male    DOB: 10-Oct-1945  Age: 76 y.o. MRN: [id number]  CC:  Chief Complaint  Patient presents with   Hyperlipidemia    No concerns due for recheck, no refills needed today recently sent for several months worth    Diabetes    Pt reports no concerns does not check glucose often home nurse did A1c 2/28 7.2 at that time    Hypertension    Pt here for recheck on BP no concerns, denies physical sxs, no refills needed today recently sent for several months worth     Back Pain    Pt is requesting refill of hyrocodone for chronic back pain last given 09/11/2020   HPI Alexander [name] presents for   Diabetes: Complicated by prior hypoglycemia, microalbuminuria.  Last discussed in December.  Glipizide was decreased previously to 2.5 mg twice daily.  Continue metformin 1000 mg twice daily. Home readings:none.  No recent symptomatic lows, reports A1c of 7.8 on home nurse visit few weeks ago(03/20/21).   Microalbumin: 2.1 on 10/30/2020.  ACE inhibitor allergy with angioedema. Optho, foot exam, pneumovax:  Pneumonia vaccine recommended. Declined.  COVID-vaccine: booster recommended Flu vaccine recommended - declined.  Discussed shingles at his pharmacy.  Immunization History  Administered Date(s) Administered   PFIZER(Purple Top)SARS-COV-2 Vaccination 03/03/2019, 03/24/2019, 10/26/2019   Td 07/17/2014     Lab Results  Component Value Date   HGBA1C 6.6 (A) 12/21/2020   HGBA1C 6.9 (H) 09/11/2020   HGBA1C 6.6 (H) 05/10/2020   Lab Results  Component Value Date   MICROALBUR 2.1 (H) 10/30/2020   LDLCALC 41 09/11/2020   CREATININE 1.11 09/11/2020   Hyperlipidemia: Lipitor 40 mg daily, no new myalgias.  Lab Results  Component Value Date   CHOL 87 09/11/2020   HDL 32.90 (L) 09/11/2020   LDLCALC 41 09/11/2020   TRIG 67.0 09/11/2020   CHOLHDL 3 09/11/2020   Lab Results  Component Value Date   ALT 8 09/11/2020   AST 12 09/11/2020    ALKPHOS 104 09/11/2020   BILITOT 0.5 09/11/2020    Hypertension: Amlodipine 10 mg daily, no recent new side effects. Home readings:  130/70 on nurse visit, other home readings normal.  Quant[place]lo circulation screen on 2/28 - 0.40 on R, 0.58 on left. Denies claudication symptoms.  Some urinary leakage - followed by urology for prostate CA with rising PSA. Due for appt (3 month repeat PSA mentioned on 11/08/20 note).  BP Readings from Last 3 Encounters:  03/23/21 138/80  12/21/20 132/70  11/28/20 (!) 169/75   Lab Results  Component Value Date   CREATININE 1.11 09/11/2020   Chronic low back pain History of chronic low back pain with radiculopathy, Has received epidural injections previously - Dr. Brien Few , prior physical therapy, treatment with Cymbalta.  Cymbalta caused too much sedation and only took for a few days.  Gabapentin 900 mg BID working well. treated with hydrocodone -  Episodic use of hydrocodone for flares. 1/2 pill every few weeks or so. Last discussed in August 2022. Controlled substance database (PDMP) reviewed. No concerns appreciated. Last filled for #20 on 09/11/2020. No new side effects.       History Patient Active Problem List   Diagnosis Date Noted   Right carpal tunnel syndrome 06/08/2019   Malignant neoplasm of prostate ([place]) 10/27/2018   Allergic reaction 08/10/2018   Angioedema 08/10/2018   Peripheral arterial occlusive disease ([place]) 10/28/2016   Hyperlipidemia LDL goal <70 05/01/2015  Tobacco use disorder 12/09/2013   Coronary artery disease involving native coronary artery of native heart without angina pectoris 12/09/2013   Erectile dysfunction due to diseases classified elsewhere 12/09/2013   Type 2 diabetes mellitus with other circulatory complications ([place]) 29/52/8413   Left lumbar radiculopathy 09/30/2012   Chronic rhinitis 04/08/2011   Hypertension    HTN (hypertension), benign 03/09/2011   History of prostate cancer 03/09/2011   Past  Medical History:  Diagnosis Date   Angio-edema    CAD (coronary artery disease)    Per [place] New Patient Packet    Carpal tunnel syndrome    Per [place] New Patient Packet    Diabetes mellitus without complication ([place])    Hypertension    Neuromuscular disorder ([place])    Peripheral arterial disease ([place]) 11/14/2016   non-occlusive, asymptomatic, seen by vascular surgery Dr. Oneida Alar who rec repeated ABIs annually   Prostate cancer [facility])    Past Surgical History:  Procedure Laterality Date   CATARACT EXTRACTION Right 08/23/2020   COLON SURGERY     "locked bowel" fixed   COLONOSCOPY  2019   HERNIA REPAIR     LEFT HEART CATHETERIZATION WITH CORONARY ANGIOGRAM N/A 09/15/2013   Procedure: LEFT HEART CATHETERIZATION WITH CORONARY ANGIOGRAM;  Surgeon: Peter M Martinique, MD;  Location: [facility] CATH LAB;  Service: Cardiovascular;  Laterality: N/A;   PROSTATE SURGERY  2005   Allergies  Allergen Reactions   Lisinopril Swelling    angioedema   Prior to Admission medications   Medication Sig Start Date End Date Taking? Authorizing Provider  Accu-Chek Softclix Lancets lancets Use once daily if symptoms of low blood sugar 10/19/20  Yes Wendie Agreste, MD  amLODipine (NORVASC) 10 MG tablet TAKE 1 TABLET BY MOUTH  DAILY 01/25/21  Yes Wendie Agreste, MD  aspirin EC 81 MG tablet Take 81 mg by mouth daily as needed for mild pain.    Yes [provider]  atorvastatin (LIPITOR) 40 MG tablet Take 1 tablet (40 mg total) by mouth daily. 02/01/21  Yes Wendie Agreste, MD  blood glucose meter kit and supplies Dispense based on patient and insurance preference. Once per day if symptoms of low blood sugar. Meter of choice per insurance coverage. 10/19/20  Yes Wendie Agreste, MD  Blood Glucose Monitoring Suppl (ACCU-CHEK GUIDE) w/Device KIT Use once daily if symptoms of low blood sugar 10/19/20  Yes Wendie Agreste, MD  Cholecalciferol (VITAMIN D3) 50 MCG (2000 UT) TABS Take by mouth.   Yes [provider]  cyclobenzaprine (FLEXERIL) 5 MG tablet Take 1 tablet (5 mg total) by mouth 3 (three) times daily as needed for muscle spasms. 10/30/20  Yes Maximiano Coss, NP  diphenhydrAMINE (BENADRYL) 25 MG tablet Take 25 mg by mouth every 6 (six) hours as needed.   Yes [provider]  EPINEPHrine (EPIPEN 2-PAK) 0.3 mg/0.3 mL IJ SOAJ injection Inject 0.3 mLs (0.3 mg total) into the muscle as needed for anaphylaxis. 08/10/18  Yes Bobbitt, [place] Muta, MD  fexofenadine (ALLEGRA) 180 MG tablet Take 1 tablet (180 mg total) by mouth daily. 08/10/18  Yes Bobbitt, [place] Muta, MD  fluticasone [facility]) 50 MCG/ACT nasal spray Place 2 sprays into both nostrils daily. 03/18/19  Yes Jacelyn Pi, Lilia Argue, MD  gabapentin (NEURONTIN) 300 MG capsule Take 3 capsules (900 mg total) by mouth 2 (two) times daily. 09/11/20  Yes Wendie Agreste, MD  glipiZIDE (GLUCOTROL) 5 MG tablet TAKE 1 TABLET BY MOUTH  TWICE DAILY  BEFORE A MEAL 01/25/21  Yes Wendie Agreste, MD  glucose blood test strip Use once daily if symptoms of low blood sugar 10/19/20  Yes   HYDROcodone-acetaminophen (NORCO/VICODIN) 5-325 MG tablet Take 1 tablet by mouth every 6 (six) hours as needed for moderate pain. 09/11/20  Yes Wendie Agreste, MD  metFORMIN (GLUCOPHAGE) 1000 MG tablet Take 1 tablet (1,000 mg total) by mouth 2 (two) times daily with a meal. 06/08/20  Yes Eubanks, Carlos American, NP  montelukast (SINGULAIR) 10 MG tablet Take 1 tablet (10 mg total) by mouth daily. 09/11/20  Yes Wendie Agreste, MD  moxifloxacin (VIGAMOX) 0.5 % ophthalmic solution Place 1 drop into the right eye (operative eye) 4 (four) times daily. Start after surgery 08/08/20  Yes   sildenafil (VIAGRA) 100 MG tablet Take 100 mg by mouth as needed for erectile dysfunction.    Yes [provider]  Sod Fluoride-Potassium Nitrate (SODIUM FLUORIDE 5000 SENSITIVE) 1.1-5 % GEL Brush twice daily with toothpaste and spit out. Do not rinse, eat, or drink after using  02/01/21  Yes   triamcinolone cream (KENALOG) 0.1 % APPLY TOPICALLY TWICE DAILY 09/11/20  Yes Wendie Agreste, MD   Social History   Socioeconomic History   Marital status: Widowed    Spouse name: Not on file   Number of children: 2   Years of education: 9   Highest education level: Not on file  Occupational History   Occupation: retired  Tobacco Use   Smoking status: Every Day    Packs/day: 1.00    Years: 55.00    Pack years: 55.00    Types: Cigarettes   Smokeless tobacco: Never  Vaping Use   Vaping Use: Never used  Substance and Sexual Activity   Alcohol use: No   Drug use: No   Sexual activity: Yes  Other Topics Concern   Not on file  Social History Narrative   Patient is single and his daughter lives with him.   Patient has two children.   Patient has a 9th grade education.   Patient works in Architect (part-time).   Patient drinks one to two cups of coffee daily.   Patient is right-handed.         Per [facility] New Patient Packet:   Diet:Left blank      Caffeine:Yes      Married, if yes what year: Widowed      Do you live in a house, apartment, assisted living, condo, trailer, ect: House, 3 persons      Is it one or more stories: One      Pets: No      Current/Past profession: Interior and spatial designer      Highest level or education completed:       Exercise:         No         Type and how often:          Living Will: No   DNR: No   POA/HPOA: No      Functional Status:   Do you have difficulty bathing or dressing yourself? No   Do you have difficulty preparing food or eating? No   Do you have difficulty managing your medications? No   Do you have difficulty managing your finances? No   Do you have difficulty affording your medications? No   Social Determinants of Health   Financial Resource Strain: Not on file  Food Insecurity: Not on file  Transportation Needs: Not on  file  Physical Activity: Not on file  Stress: Not on file  Social Connections: Not on  file  Intimate Partner Violence: Not on file    Review of Systems  Constitutional:  Negative for fatigue and unexpected weight change.  Eyes:  Negative for visual disturbance.  Respiratory:  Negative for cough, chest tightness and shortness of breath.   Cardiovascular:  Negative for chest pain, palpitations and leg swelling.  Gastrointestinal:  Negative for abdominal pain and blood in stool.  Neurological:  Negative for dizziness, light-headedness and headaches.    Objective:   Vitals:   03/23/21 1038  BP: 138/80  Pulse: 71  Resp: 15  Temp: 98 F (36.7 C)  TempSrc: Temporal  SpO2: 96%  Weight: 134 lb 12.8 oz (61.1 kg)  Height: _0  (1.651 m)     Physical Exam Vitals reviewed.  Constitutional:      Appearance: He is well-developed.  HENT:     Head: Normocephalic and atraumatic.  Neck:     Vascular: No carotid bruit or JVD.  Cardiovascular:     Rate and Rhythm: Normal rate and regular rhythm.     Heart sounds: Normal heart sounds. No murmur heard. Pulmonary:     Effort: Pulmonary effort is normal.     Breath sounds: Normal breath sounds. No rales.  Musculoskeletal:     Right lower leg: No edema.     Left lower leg: No edema.  Skin:    General: Skin is warm and dry.  Neurological:     Mental Status: He is alert and oriented to person, place, and time.  Psychiatric:        Mood and Affect: Mood normal.    Assessment & Plan:  Alexander [name] is a 76 y.o. male . Type 2 diabetes mellitus with other circulatory complications (HCC) - Plan: Comprehensive metabolic panel, Hemoglobin A1c, glipiZIDE (GLUCOTROL) 5 MG tablet, metFORMIN (GLUCOPHAGE) 1000 MG tablet  -Check labs, continue same regimen to minimize risk of hypoglycemia with glipizide 2.5 mg twice daily.   RTC precautions.  Lumbar radiculopathy, chronic - Plan: HYDROcodone-acetaminophen (NORCO/VICODIN) 5-325 MG tablet  -Overall stable with intermittent need for hydrocodone, refilled for as needed use with RTC  precautions if increased or more frequent need, and would recommend follow-up with his back specialist if that were to occur.  Essential hypertension - Plan: Comprehensive metabolic panel  -Stable.  Continue same regimen.  Check labs.  Hyperlipidemia LDL goal <70 - Plan: Comprehensive metabolic panel, Lipid panel Tolerating current dose Lipitor, continue same, check labs.  Circulation problem - Plan: Ambulatory referral to Vascular Surgery Screening for peripheral arterial disease as above with abnormal testing, will refer to vascular surgery to decide on further testing.  Asymptomatic at this time without symptoms of claudication.  Meds ordered this encounter  Medications   HYDROcodone-acetaminophen (NORCO/VICODIN) 5-325 MG tablet    Sig: Take 1 tablet by mouth every 6 (six) hours as needed for moderate pain.    Dispense:  20 tablet    Refill:  0   glipiZIDE (GLUCOTROL) 5 MG tablet    Sig: Take 0.5 tablets (2.5 mg total) by mouth 2 (two) times daily before a meal.    Dispense:  90 tablet    Refill:  3    Requesting 1 year supply   metFORMIN (GLUCOPHAGE) 1000 MG tablet    Sig: Take 1 tablet (1,000 mg total) by mouth 2 (two) times daily with a meal.    Dispense:  180 tablet  Refill:  1   Patient Instructions  I recommend covid booster and shingles vaccine at your pharmacy. Let me know if you change your mind about flu or pneumonia vaccines.  No med changes today. I will let you know if any concerns on labs, including repeat diabetes test.  Follow up with urology for the urinary symptoms as it appears you are due for repeat PSA.  Hydrocodone refilled, but if you require that more often, please follow up with back specialist. I will refer you to vascular specialist with recent abnormal circulation screening.  Take care.     Signed,   Merri Ray, MD [place], [facility] 03/23/21 11:23 AM

## 2021-03-23 NOTE — Patient Instructions (Addendum)
I recommend covid booster and shingles vaccine at your pharmacy. Let me know if you change your mind about flu or pneumonia vaccines.  ?No med changes today. I will let you know if any concerns on labs, including repeat diabetes test.  ?Follow up with urology for the urinary symptoms as it appears you are due for repeat PSA.  ?Hydrocodone refilled, but if you require that more often, please follow up with back specialist. ?I will refer you to vascular specialist with recent abnormal circulation screening.  ?Take care.  ?

## 2021-03-25 ENCOUNTER — Encounter: Payer: Self-pay | Admitting: Family Medicine

## 2021-04-09 ENCOUNTER — Encounter: Payer: Self-pay | Admitting: Family Medicine

## 2021-04-09 NOTE — Telephone Encounter (Signed)
Unfortunately we did not discuss the urinary incontinence at his last visit. On chart review I see a note from Dr. Nevada Crane in October 2021 with post prostatectomy urinary incontinence, stable since completing radiation and status post pelvic floor physical therapy options.  Has he discussed medications with his most recent urologist, Dr. Jeffie Pollock?  That may be a good place to start.   ?

## 2021-04-09 NOTE — Telephone Encounter (Signed)
03/23/21 visit does he need to return? ? ?

## 2021-04-29 ENCOUNTER — Other Ambulatory Visit: Payer: Self-pay | Admitting: Family Medicine

## 2021-04-29 DIAGNOSIS — L309 Dermatitis, unspecified: Secondary | ICD-10-CM

## 2021-05-17 LAB — HM DIABETES EYE EXAM

## 2021-05-23 ENCOUNTER — Encounter: Payer: Self-pay | Admitting: Family Medicine

## 2021-05-28 NOTE — Telephone Encounter (Signed)
NA

## 2021-05-30 ENCOUNTER — Other Ambulatory Visit: Payer: Self-pay | Admitting: Registered Nurse

## 2021-05-30 DIAGNOSIS — M542 Cervicalgia: Secondary | ICD-10-CM

## 2021-05-30 NOTE — Telephone Encounter (Signed)
Patient is requesting a refill of the following medications: ?Requested Prescriptions  ? ?Pending Prescriptions Disp Refills  ? cyclobenzaprine (FLEXERIL) 5 MG tablet 30 tablet 1  ?  Sig: Take 1 tablet (5 mg total) by mouth 3 (three) times daily as needed for muscle spasms.  ? ? ?Date of patient request: 05/30/2021 ?Last office visit: 03/23/2021 ?Date of last refill: 10/30/2020 ?Last refill amount: 30 tablets  ?Follow up time period per chart: 09/27/2021  ?

## 2021-05-30 NOTE — Telephone Encounter (Signed)
More info please.  Last time we talked he was using gabapentin and  hydrocodone for back.  What is he taking Flexeril for?  And how often? Thanks.  ?

## 2021-06-01 ENCOUNTER — Other Ambulatory Visit (HOSPITAL_COMMUNITY): Payer: Self-pay

## 2021-06-01 NOTE — Telephone Encounter (Signed)
Called LM asked pt to call back to discuss if he is taking this and what for as this was intended to be a short term medication for acute pain apx September last year ?

## 2021-06-01 NOTE — Telephone Encounter (Signed)
Waiting on call back regarding more information on the use of this medicine as initially prescribed for acute issues.  Will hold on refilling until further information obtained. ?

## 2021-06-01 NOTE — Telephone Encounter (Signed)
Please advise 

## 2021-06-05 ENCOUNTER — Other Ambulatory Visit: Payer: Self-pay | Admitting: Nurse Practitioner

## 2021-06-05 ENCOUNTER — Other Ambulatory Visit: Payer: Self-pay | Admitting: Family Medicine

## 2021-06-05 DIAGNOSIS — L309 Dermatitis, unspecified: Secondary | ICD-10-CM

## 2021-06-05 DIAGNOSIS — E1159 Type 2 diabetes mellitus with other circulatory complications: Secondary | ICD-10-CM

## 2021-06-08 ENCOUNTER — Telehealth: Payer: Self-pay

## 2021-06-08 NOTE — Telephone Encounter (Signed)
Patient states he requested a refill for flexeril because he is having some cramping in both legs and feet for a couple of weeks. I told patient that it was originally given from [facility] due to neck pain and he states he was on it before then. Please advise

## 2021-06-11 NOTE — Telephone Encounter (Signed)
Please schedule visit to review the symptoms further and discuss if Flexeril would be appropriate.  Thanks

## 2021-06-11 NOTE — Telephone Encounter (Signed)
Pt has been scheduled.  °

## 2021-06-13 ENCOUNTER — Telehealth: Payer: Self-pay

## 2021-06-13 NOTE — Telephone Encounter (Signed)
Sent office visit report to scan.

## 2021-06-14 ENCOUNTER — Ambulatory Visit (INDEPENDENT_AMBULATORY_CARE_PROVIDER_SITE_OTHER): Payer: Medicare Other | Admitting: Family Medicine

## 2021-06-14 ENCOUNTER — Encounter: Payer: Self-pay | Admitting: Family Medicine

## 2021-06-14 VITALS — BP 132/78 | HR 76 | Temp 98.1°F | Resp 16 | Ht 65.0 in | Wt 131.2 lb

## 2021-06-14 DIAGNOSIS — G252 Other specified forms of tremor: Secondary | ICD-10-CM | POA: Diagnosis not present

## 2021-06-14 DIAGNOSIS — M5416 Radiculopathy, lumbar region: Secondary | ICD-10-CM

## 2021-06-14 DIAGNOSIS — M62838 Other muscle spasm: Secondary | ICD-10-CM

## 2021-06-14 MED ORDER — TIZANIDINE HCL 2 MG PO TABS: 2.0000 mg | ORAL_TABLET | Freq: Four times a day (QID) | ORAL | 0 refills | Status: DC | PRN

## 2021-06-14 NOTE — Progress Notes (Signed)
Subjective:  Patient ID: Alexander [name], male    DOB: [birth year]  Age: 76 y.o. MRN: [id number]  CC:  Chief Complaint  Patient presents with   leg cramps    Pt notes has had some leg and foot cramping on both legs intermittently from hip down, notes this happens frequently at night and will have to get up from bed     HPI [name] presents for   Leg cramps Bilateral leg cramping, foot cramping from hip down. Both thighs, then radiates to feet - muscles tighten up.  Notes at bedtime. Not every night. 2 times in past few weeks.  Has to get out of bed, walk, or massage muscles - improves after about an hour, but then recurs.  He does have a history of peripheral arterial occlusive disease, diabetes, lumbar radiculopathy with spinal stenosis.  Takes gabapentin 951m bid.   Hand tremors Past few years - both sides, comes and goes. Not at rest - notes with activity at times. No changes.   History Patient Active Problem List   Diagnosis Date Noted   Right carpal tunnel syndrome 06/08/2019   Malignant [place] of prostate ([place] 10/27/2018   Allergic reaction 08/10/2018   Angioedema 08/10/2018   Peripheral arterial occlusive disease ([facility] 10/28/2016   Hyperlipidemia LDL goal <70 05/01/2015   Tobacco use disorder 12/09/2013   Coronary artery disease involving native coronary artery of native heart without angina pectoris 12/09/2013   Erectile dysfunction due to diseases classified elsewhere 12/09/2013   Type 2 diabetes mellitus with other circulatory complications ([place] 176/16/0737  Left lumbar radiculopathy 09/30/2012   Chronic rhinitis 04/08/2011   Hypertension    HTN (hypertension), benign 03/09/2011   History of prostate cancer 03/09/2011   Past Medical History:  Diagnosis Date   Angio-edema    CAD (coronary artery disease)    [facility]    Carpal tunnel syndrome    [facility]    Diabetes mellitus without complication ([facility]     Hypertension    Neuromuscular disorder ([facility]    Peripheral arterial disease ([facility] 11/14/2016   non-occlusive, asymptomatic, seen by vascular surgery Dr. FOneida Alarwho rec repeated ABIs annually   Prostate cancer ([facility]    Past Surgical History:  Procedure Laterality Date   CATARACT EXTRACTION Right 08/23/2020   COLON SURGERY     "locked bowel" fixed   COLONOSCOPY  2019   HERNIA REPAIR     LEFT HEART CATHETERIZATION WITH CORONARY ANGIOGRAM N/A 09/15/2013   Procedure: LEFT HEART CATHETERIZATION WITH CORONARY ANGIOGRAM;  Surgeon: Peter M JMartinique MD;  [facility];  Service: Cardiovascular;  Laterality: N/A;   PROSTATE SURGERY  2005   Allergies  Allergen Reactions   Lisinopril Swelling    angioedema   Prior to Admission medications   Medication Sig Start Date End Date Taking? Authorizing Provider  Accu-Chek Softclix Lancets lancets Use once daily if symptoms of low blood sugar 10/19/20  Yes GWendie Agreste MD  amLODipine (NORVASC) 10 MG tablet TAKE 1 TABLET BY MOUTH  DAILY 01/25/21  Yes GWendie Agreste MD  aspirin EC 81 MG tablet Take 81 mg by mouth daily as needed for mild pain.    Yes [provider]  atorvastatin (LIPITOR) 40 MG tablet TAKE 1 TABLET BY MOUTH DAILY 06/06/21  Yes GWendie Agreste MD  blood glucose meter kit and supplies Dispense based on patient and insurance preference. Once per day if  symptoms of low blood sugar. Meter of choice per insurance coverage. 10/19/20  Yes Wendie Agreste, MD  Blood Glucose Monitoring Suppl (ACCU-CHEK GUIDE) w/Device KIT Use once daily if symptoms of low blood sugar 10/19/20  Yes Wendie Agreste, MD  Cholecalciferol (VITAMIN D3) 50 MCG (2000 UT) TABS Take by mouth.   Yes [provider]  cyclobenzaprine (FLEXERIL) 5 MG tablet Take 1 tablet (5 mg total) by mouth 3 (three) times daily as needed for muscle spasms. 10/30/20  Yes Maximiano Coss, NP  diphenhydrAMINE (BENADRYL) 25 MG tablet Take 25 mg by mouth every 6  (six) hours as needed.   Yes [provider]  EPINEPHrine (EPIPEN 2-PAK) 0.3 mg/0.3 mL IJ SOAJ injection Inject 0.3 mLs (0.3 mg total) into the muscle as needed for anaphylaxis. 08/10/18  Yes Bobbitt, [place] Muta, MD  fexofenadine (ALLEGRA) 180 MG tablet Take 1 tablet (180 mg total) by mouth daily. 08/10/18  Yes Bobbitt, [place] Muta, MD  fluticasone [facility]) 50 MCG/ACT nasal spray Place 2 sprays into both nostrils daily. 03/18/19  Yes Jacelyn Pi, Lilia Argue, MD  gabapentin (NEURONTIN) 300 MG capsule Take 3 capsules (900 mg total) by mouth 2 (two) times daily. 09/11/20  Yes Wendie Agreste, MD  glipiZIDE (GLUCOTROL) 5 MG tablet Take 0.5 tablets (2.5 mg total) by mouth 2 (two) times daily before a meal. 03/23/21  Yes Wendie Agreste, MD  glucose blood test strip Use once daily if symptoms of low blood sugar 10/19/20  Yes   HYDROcodone-acetaminophen (NORCO/VICODIN) 5-325 MG tablet Take 1 tablet by mouth every 6 (six) hours as needed for moderate pain. 03/23/21  Yes Wendie Agreste, MD  metFORMIN (GLUCOPHAGE) 1000 MG tablet Take 1 tablet (1,000 mg total) by mouth 2 (two) times daily with a meal. 03/23/21  Yes Wendie Agreste, MD  montelukast (SINGULAIR) 10 MG tablet Take 1 tablet (10 mg total) by mouth daily. 09/11/20  Yes Wendie Agreste, MD  moxifloxacin (VIGAMOX) 0.5 % ophthalmic solution Place 1 drop into the right eye (operative eye) 4 (four) times daily. Start after surgery 08/08/20  Yes   sildenafil (VIAGRA) 100 MG tablet Take 100 mg by mouth as needed for erectile dysfunction.    Yes [provider]  Sod Fluoride-Potassium Nitrate (SODIUM FLUORIDE 5000 SENSITIVE) 1.1-5 % GEL Brush twice daily with toothpaste and spit out. Do not rinse, eat, or drink after using 02/01/21  Yes   triamcinolone cream (KENALOG) 0.1 % APPLY TOPICALLY TWICE DAILY 06/06/21  Yes Wendie Agreste, MD   Social History   Socioeconomic History   Marital status: Widowed    Spouse name: Not on file   Number  of children: 2   Years of education: 9   Highest education level: Not on file  Occupational History   Occupation: retired  Tobacco Use   Smoking status: Every Day    Packs/day: 1.00    Years: 55.00    Pack years: 55.00    Types: Cigarettes   Smokeless tobacco: Never  Vaping Use   Vaping Use: Never used  Substance and Sexual Activity   Alcohol use: No   Drug use: No   Sexual activity: Yes  Other Topics Concern   Not on file  Social History Narrative   Patient is single and his daughter lives with him.   Patient has two children.   Patient has a 9th grade education.   Patient works in Architect (part-time).   Patient drinks one to two cups  of coffee daily.   Patient is right-handed.         Per [facility] New Patient Packet:   Diet:Left blank      Caffeine:Yes      Married, if yes what year: Widowed      Do you live in a house, apartment, assisted living, condo, trailer, ect: House, 3 persons      Is it one or more stories: One      Pets: No      Current/Past profession: Interior and spatial designer      Highest level or education completed:       Exercise:         No         Type and how often:          Living Will: No   DNR: No   POA/HPOA: No      Functional Status:   Do you have difficulty bathing or dressing yourself? No   Do you have difficulty preparing food or eating? No   Do you have difficulty managing your medications? No   Do you have difficulty managing your finances? No   Do you have difficulty affording your medications? No   Social Determinants of Radio broadcast assistant Strain: Not on file  Food Insecurity: Not on file  Transportation Needs: Not on file  Physical Activity: Not on file  Stress: Not on file  Social Connections: Not on file  Intimate Partner Violence: Not on file    Review of Systems Per HPI.   Objective:   Vitals:   06/14/21 1019  BP: 132/78  Pulse: 76  Resp: 16  Temp: 98.1 F (36.7 C)  TempSrc: Temporal  SpO2: 97%  Weight:  131 lb 3.2 oz (59.5 kg)  Height: 5' 5"  (1.651 m)     Physical Exam Constitutional:      General: He is not in acute distress.    Appearance: Normal appearance. He is well-developed.  HENT:     Head: Normocephalic and atraumatic.  Cardiovascular:     Rate and Rhythm: Normal rate.  Pulmonary:     Effort: Pulmonary effort is normal.  Musculoskeletal:     Comments: Thigh muscles, calf muscles soft, nontender.  No swelling.  Negative seated straight leg raise.  Negative Homans.  No edema.  Neurological:     Mental Status: He is alert and oriented to person, place, and time.     Comments: No visible tremor at rest or with intention on exam, not noted with finger-to-nose.    Psychiatric:        Mood and Affect: Mood normal.       Assessment & Plan:  WOLFGANG FINIGAN is a 76 y.o. male . Muscle spasms of both lower extremities - Plan: tiZANidine (ZANAFLEX) 2 MG tablet Lumbar radiculopathy, chronic - Plan: tiZANidine (ZANAFLEX) 2 MG tablet  -Likely related to his lumbar radiculopathy versus combination of PVD and radiculopathy.  He is still active, may have some spasm from fatigue of muscles from daytime work.  Recommended to try stretches of thighs and calves at bedtime to lessen leaking with cramps.  If not improving short-term tizanidine with potential risks and side effects provided but recommended he discuss with his neurosurgeon.  RTC precautions.  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  History consistent with intention tremor.  Will refer to neurology to decide on further work-up/eval.  RTC precautions  Meds ordered  this encounter  Medications   tiZANidine (ZANAFLEX) 2 MG tablet    Sig: Take 1 tablet (2 mg total) by mouth every 6 (six) hours as needed for muscle spasms.    Dispense:  15 tablet    Refill:  0   Patient Instructions  Try stretching legs - thigh and calves - at bedtime. Discuss leg cramps with  neurosurgery if they continue.  Short term muscle relaxant only if needed, but there are risks with combining with other meds.  Try not to use the tizanidine unless absolutely necessary.  I will refer you to neurology for the hand tremor but if that worsens please be seen.  If there are other concerns we were unable to address today, please schedule separate visit.  Thank you for coming in today and let me know if there are questions      Signed,   Merri Ray, MD [facility] 06/14/21 11:16 AM

## 2021-06-14 NOTE — Patient Instructions (Addendum)
Try stretching legs - thigh and calves - at bedtime. Discuss leg cramps with neurosurgery if they continue.  Short term muscle relaxant only if needed, but there are risks with combining with other meds.  Try not to use the tizanidine unless absolutely necessary.  I will refer you to neurology for the hand tremor but if that worsens please be seen.  If there are other concerns we were unable to address today, please schedule separate visit.  Thank you for coming in today and let me know if there are questions

## 2021-06-17 ENCOUNTER — Other Ambulatory Visit: Payer: Self-pay | Admitting: Family Medicine

## 2021-06-17 DIAGNOSIS — M5416 Radiculopathy, lumbar region: Secondary | ICD-10-CM

## 2021-06-20 NOTE — Telephone Encounter (Signed)
Patient is requesting a refill of the following medications: Requested Prescriptions   Pending Prescriptions Disp Refills   gabapentin (NEURONTIN) 300 MG capsule [Pharmacy Med Name: Gabapentin 300 MG Oral Capsule] 600 capsule 2    Sig: TAKE 3 CAPSULES BY MOUTH  TWICE DAILY    Date of patient request: 06/20/21 Last office visit: 06/14/21 Date of last refill: 09/11/20 Last refill amount: 600

## 2021-06-20 NOTE — Telephone Encounter (Signed)
Medication discussed at 06/14/2021 visit.  Refill ordered.

## 2021-06-25 ENCOUNTER — Encounter: Payer: Self-pay | Admitting: Neurology

## 2021-06-25 ENCOUNTER — Encounter: Payer: Self-pay | Admitting: Nurse Practitioner

## 2021-06-26 ENCOUNTER — Encounter: Payer: Medicare Other | Admitting: Nurse Practitioner

## 2021-06-26 ENCOUNTER — Telehealth: Payer: Self-pay

## 2021-06-26 NOTE — Progress Notes (Signed)
Err

## 2021-06-26 NOTE — Progress Notes (Deleted)
   This service is provided via telemedicine  No vital signs collected/recorded due to the encounter was a telemedicine visit.   Location of patient (ex: home, work):  Home  Patient consents to a telephone visit: Yes, see telephone visit dated 06/26/21  Location of the provider (ex: office, home):  [facility], Office   Name of any referring provider:  N/A  Names of all persons participating in the telemedicine service and their role in the encounter:  S.Chrae B/CMA, Sherrie Mustache, NP, and Patient   Time spent on call:  9 min with medical assistant

## 2021-06-26 NOTE — Telephone Encounter (Signed)
I called patient to begin his AWV scheduled for today at 8:20 am, he informed me that he is no longer our patient and is under the care of [facility]

## 2021-06-28 NOTE — Progress Notes (Unsigned)
Assessment/Plan:   Tremor, by history  -Did not see any tremor today.  Reassurance provided.  No evidence of Parkinsons disease.  Tremor not that bothersome to the patient, and he admits that it is intermittent.  If tremor becomes bothersome or more consistent in the future, I am happy to reevaluate him.   severe hyperreflexia of the lower extremities with severe lumbar spinal stenosis in 2014  -he didn't have sx but he is clear he doesn't want surgery.  He follows at [facility] and now has injections with Dr. Brien Few.    -No need to update that lumbar MRI if he does not want surgery and is happy with injections.  I personally showed him the 2014 MRI of the lumbar spine films.  Severe R CTS  -has significant wasting of the R APB and has lost significant grip strength  -doesn't want sx.  He does understand that he may continue to lose grip strength and dexterity of the hand if he does not proceed with surgery.  Subjective:   Alexander [name] was seen today in the movement disorders clinic for neurologic consultation at the request of Wendie Agreste, MD.  The consultation is for the evaluation of tremor.  Medical records made available to me have been reviewed.  Patient was at primary care on May 25 and complaining about hand tremors for the last few years.  He was also complaining about leg cramping at bedtime, felt likely due to peripheral arterial disease, diabetes and lumbar spinal stenosis.  Patient on gabapentin, 900 mg twice per day.  Patient sent here for the evaluation of tremor.  Tremor: Yes.     How long has it been going on? Few years (he thinks but is not sure)  At rest or with activation?  activation  When is it noted the most?  Holding cup of coffee  Fam hx of tremor?  Yes.   , paternal GF only  Located where?  Bilateral upper extremities (unsure if started in one and then in another or started in both at same time)  Affected by caffeine:  No., 2 cups coffee per  day  Affected by alcohol:  doesn't drink since 2003  Affected by stress:  No.  Affected by fatigue:  No.  Spills soup if on spoon:  No.  Spills glass of liquid if full:  No.  Affects ADL's (tying shoes, brushing teeth, etc):  No.  Tremor inducing meds:  singulair  Other Specific Symptoms:  Voice: no change Sleep: sleeps well Wet Pillows: No. Postural symptoms:  No.  Falls?  No. Bradykinesia symptoms: difficulty getting out of a chair Loss of smell:  No. Loss of taste:  No. Urinary Incontinence:  Yes.  , "I leak after my prostate operation" N/V:  No. Lightheaded:  No.  Syncope: No. Diplopia:  No.   Neuroimaging of the brain has not previously been performed.    ALLERGIES:   Allergies  Allergen Reactions   Lisinopril Swelling    angioedema    CURRENT MEDICATIONS:  Current Outpatient Medications  Medication Instructions   Accu-Chek Softclix Lancets lancets Use once daily if symptoms of low blood sugar   amLODipine (NORVASC) 10 MG tablet TAKE 1 TABLET BY MOUTH  DAILY   aspirin EC 81 mg, Oral, Daily PRN   atorvastatin (LIPITOR) 40 MG tablet TAKE 1 TABLET BY MOUTH DAILY   blood glucose meter kit and supplies Dispense based on patient and insurance preference. Once per day if symptoms  of low blood sugar.<BR>Meter of choice per insurance coverage.   Blood Glucose Monitoring Suppl (ACCU-CHEK GUIDE) w/Device KIT Use once daily if symptoms of low blood sugar   Cholecalciferol (VITAMIN D3) 50 MCG (2000 UT) TABS Oral   diphenhydrAMINE (BENADRYL) 25 mg, Oral, Every 6 hours PRN   EPINEPHrine (EPIPEN 2-PAK) 0.3 mg, Intramuscular, As needed   fexofenadine (ALLEGRA) 180 mg, Oral, Daily   fluticasone (FLONASE) 50 MCG/ACT nasal spray 2 sprays, Each Nare, Daily   gabapentin (NEURONTIN) 300 MG capsule TAKE 3 CAPSULES BY MOUTH  TWICE DAILY   glipiZIDE (GLUCOTROL) 2.5 mg, Oral, 2 times daily before meals   glucose blood test strip Use once daily if symptoms of low blood sugar    HYDROcodone-acetaminophen (NORCO/VICODIN) 5-325 MG tablet 1 tablet, Oral, Every 6 hours PRN   metFORMIN (GLUCOPHAGE) 1,000 mg, Oral, 2 times daily with meals   montelukast (SINGULAIR) 10 mg, Oral, Daily   moxifloxacin (VIGAMOX) 0.5 % ophthalmic solution Place 1 drop into the right eye (operative eye) 4 (four) times daily. Start after surgery   sildenafil (VIAGRA) 100 mg, Oral, As needed   Sod Fluoride-Potassium Nitrate (SODIUM FLUORIDE 5000 SENSITIVE) 1.1-5 % GEL Brush twice daily with toothpaste and spit out. Do not rinse, eat, or drink after using   tiZANidine (ZANAFLEX) 2 mg, Oral, Every 6 hours PRN   triamcinolone cream (KENALOG) 0.1 % APPLY TOPICALLY TWICE DAILY    Objective:   PHYSICAL EXAMINATION:    VITALS:   Vitals:   07/02/21 0909  BP: 136/72  Pulse: 84  SpO2: 92%  Weight: 127 lb 12.8 oz (58 kg)  Height: _0  (1.651 m)    GEN:  The patient appears stated age and is in NAD. HEENT:  Normocephalic, atraumatic.  The mucous membranes are moist. The superficial temporal arteries are without ropiness or tenderness. CV:  RRR Lungs:  CTAB Neck/HEME:  There are no carotid bruits bilaterally.  Neurological examination:  Orientation: The patient is alert and oriented x3.  Cranial nerves: There is good facial symmetry.  Extraocular muscles are intact. The visual fields are full to confrontational testing. The speech is fluent and clear. Soft palate rises symmetrically and there is no tongue deviation. Hearing is intact to conversational tone. Sensation: Sensation is intact to light touch throughout (facial, trunk, extremities). Vibration is intact at the bilateral big toe. There is no extinction with double simultaneous stimulation.  Motor: Strength is 5/5 in the bilateral upper and lower extremities.   There is decreased grip strength on the R.  Shoulder shrug is equal and symmetric.  There is no pronator drift. Deep tendon reflexes: Deep tendon reflexes are 2/4 at the bilateral  biceps, triceps, brachioradialis, 3/4 at the bilateral patella with crossed adductor reflexes bilaterally. Plantar responses are downgoing bilaterally.  Movement examination: Tone: There is nl tone in the bilateral upper extremities.  The tone in the lower extremities is nl.  Abnormal movements: no rest tremor.  No postural tremor.  No intention tremor.  he has no difficulty with archimedes spirals.  No tremor when he pours water from 1 glass to another. Coordination:  There is no decremation with RAM's, Gait and Station: The patient has no difficulty arising out of a deep-seated chair without the use of the hands. The patient's stride length is good.   I have reviewed and interpreted the following labs independently   Chemistry      Component Value Date/Time   NA 137 03/23/2021 1121   NA 137 10/26/2019  0828   K 4.4 03/23/2021 1121   CL 103 03/23/2021 1121   CO2 27 03/23/2021 1121   BUN 13 03/23/2021 1121   BUN 14 10/26/2019 0828   CREATININE 0.97 03/23/2021 1121   CREATININE 0.97 05/10/2020 1043      Component Value Date/Time   CALCIUM 9.7 03/23/2021 1121   ALKPHOS 126 (H) 03/23/2021 1121   AST 15 03/23/2021 1121   ALT 9 03/23/2021 1121   BILITOT 0.5 03/23/2021 1121   BILITOT <0.2 10/26/2019 0828      Lab Results  Component Value Date   TSH 1.750 05/06/2018   Lab Results  Component Value Date   WBC 5.8 05/10/2020   HGB 13.9 05/10/2020   HCT 42.3 05/10/2020   MCV 92.4 05/10/2020   PLT 265 05/10/2020      Total time spent on today's visit was 65mnutes, including both face-to-face time and nonface-to-face time.  Time included that spent on review of records (prior notes available to me/labs/imaging if pertinent), discussing treatment and goals, answering patient's questions and coordinating care.  Cc:  GWendie Agreste MD

## 2021-07-02 ENCOUNTER — Ambulatory Visit (INDEPENDENT_AMBULATORY_CARE_PROVIDER_SITE_OTHER): Payer: Medicare Other | Admitting: Neurology

## 2021-07-02 ENCOUNTER — Encounter: Payer: Self-pay | Admitting: Neurology

## 2021-07-02 VITALS — BP 136/72 | HR 84 | Ht 65.0 in | Wt 127.8 lb

## 2021-07-02 DIAGNOSIS — R292 Abnormal reflex: Secondary | ICD-10-CM

## 2021-07-02 DIAGNOSIS — M48062 Spinal stenosis, lumbar region with neurogenic claudication: Secondary | ICD-10-CM

## 2021-07-02 DIAGNOSIS — R251 Tremor, unspecified: Secondary | ICD-10-CM

## 2021-07-02 DIAGNOSIS — G5601 Carpal tunnel syndrome, right upper limb: Secondary | ICD-10-CM | POA: Diagnosis not present

## 2021-07-02 NOTE — Patient Instructions (Signed)
It was my pleasure to see you today!  I don't see any evidence of Parkinsons Disease.  If your tremor gets worse, please let me know and we can consider medication in the future.  The physicians and staff at [facility] are committed to providing excellent care. You may receive a survey requesting feedback about your experience at our office. We strive to receive "very good" responses to the survey questions. If you feel that your experience would prevent you from giving the office a "very good " response, please contact our office to try to remedy the situation. We may be reached at [phone]. Thank you for taking the time out of your busy day to complete the survey.

## 2021-07-20 LAB — HM DIABETES EYE EXAM

## 2021-07-26 ENCOUNTER — Encounter: Payer: Self-pay | Admitting: Family Medicine

## 2021-08-01 ENCOUNTER — Ambulatory Visit (INDEPENDENT_AMBULATORY_CARE_PROVIDER_SITE_OTHER): Payer: Medicare Other

## 2021-08-01 DIAGNOSIS — Z0000 Encounter for general adult medical examination without abnormal findings: Secondary | ICD-10-CM

## 2021-08-01 NOTE — Patient Instructions (Signed)
Alexander [name] , Thank you for taking time to come for your Medicare Wellness Visit. I appreciate your ongoing commitment to your health goals. Please review the following plan we discussed and let me know if I can assist you in the future.   Screening recommendations/referrals: Colonoscopy: no longer required  Recommended yearly ophthalmology/optometry visit for glaucoma screening and checkup Recommended yearly dental visit for hygiene and checkup  Vaccinations: Influenza vaccine: declined  Pneumococcal vaccine: due  Tdap vaccine: 07/17/2014 Shingles vaccine: will consider     Advanced directives: yes   Conditions/risks identified: none   Next appointment: none  Preventive Care 75 Years and Older, Male Preventive care refers to lifestyle choices and visits with your health care provider that can promote health and wellness. What does preventive care include? A yearly physical exam. This is also called an annual well check. Dental exams once or twice a year. Routine eye exams. Ask your health care provider how often you should have your eyes checked. Personal lifestyle choices, including: Daily care of your teeth and gums. Regular physical activity. Eating a healthy diet. Avoiding tobacco and drug use. Limiting alcohol use. Practicing safe sex. Taking low doses of aspirin every day. Taking vitamin and mineral supplements as recommended by your health care provider. What happens during an annual well check? The services and screenings done by your health care provider during your annual well check will depend on your age, overall health, lifestyle risk factors, and family history of disease. Counseling  Your health care provider may ask you questions about your: Alcohol use. Tobacco use. Drug use. Emotional well-being. Home and relationship well-being. Sexual activity. Eating habits. History of falls. Memory and ability to understand (cognition). Work and work  Statistician. Screening  You may have the following tests or measurements: Height, weight, and BMI. Blood pressure. Lipid and cholesterol levels. These may be checked every 5 years, or [name] frequently if you are over 42 years old. Skin check. Lung cancer screening. You may have this screening every year starting at age 34 if you have a 30-pack-year history of smoking and currently smoke or have quit within the past 15 years. Fecal occult blood test (FOBT) of the stool. You may have this test every year starting at age 36. Flexible sigmoidoscopy or colonoscopy. You may have a sigmoidoscopy every 5 years or a colonoscopy every 10 years starting at age 61. Prostate cancer screening. Recommendations will vary depending on your family history and other risks. Hepatitis C blood test. Hepatitis B blood test. Sexually transmitted disease (STD) testing. Diabetes screening. This is done by checking your blood sugar (glucose) after you have not eaten for a while (fasting). You may have this done every 1-3 years. Abdominal aortic aneurysm (AAA) screening. You may need this if you are a current or former smoker. Osteoporosis. You may be screened starting at age 20 if you are at high risk. Talk with your health care provider about your test results, treatment options, and if necessary, the need for [name] tests. Vaccines  Your health care provider may recommend certain vaccines, such as: Influenza vaccine. This is recommended every year. Tetanus, diphtheria, and acellular pertussis (Tdap, Td) vaccine. You may need a Td booster every 10 years. Zoster vaccine. You may need this after age 13. Pneumococcal 13-valent conjugate (PCV13) vaccine. One dose is recommended after age 24. Pneumococcal polysaccharide (PPSV23) vaccine. One dose is recommended after age 73. Talk to your health care provider about which screenings and vaccines you need and how  often you need them. This information is not intended to replace  advice given to you by your health care provider. Make sure you discuss any questions you have with your health care provider. Document Released: 02/03/2015 Document Revised: 09/27/2015 Document Reviewed: 11/08/2014 Elsevier Interactive Patient Education  2017 [place] Prevention in the Home Falls can cause injuries. They can happen to people of all ages. There are many things you can do to make your home safe and to help prevent falls. What can I do on the outside of my home? Regularly fix the edges of walkways and driveways and fix any cracks. Remove anything that might make you trip as you walk through a door, such as a raised step or threshold. Trim any bushes or trees on the path to your home. Use bright outdoor lighting. Clear any walking paths of anything that might make someone trip, such as rocks or tools. Regularly check to see if handrails are loose or broken. Make sure that both sides of any steps have handrails. Any raised decks and porches should have guardrails on the edges. Have any leaves, snow, or ice cleared regularly. Use sand or salt on walking paths during winter. Clean up any spills in your garage right away. This includes oil or grease spills. What can I do in the bathroom? Use night lights. Install grab bars by the toilet and in the tub and shower. Do not use towel bars as grab bars. Use non-skid mats or decals in the tub or shower. If you need to sit down in the shower, use a plastic, non-slip stool. Keep the floor dry. Clean up any water that spills on the floor as soon as it happens. Remove soap buildup in the tub or shower regularly. Attach bath mats securely with double-sided non-slip rug tape. Do not have throw rugs and other things on the floor that can make you trip. What can I do in the bedroom? Use night lights. Make sure that you have a light by your bed that is easy to reach. Do not use any sheets or blankets that are too big for your bed.  They should not hang down onto the floor. Have a firm chair that has side arms. You can use this for support while you get dressed. Do not have throw rugs and other things on the floor that can make you trip. What can I do in the kitchen? Clean up any spills right away. Avoid walking on wet floors. Keep items that you use a lot in easy-to-reach places. If you need to reach something above you, use a strong step stool that has a grab bar. Keep electrical cords out of the way. Do not use floor polish or wax that makes floors slippery. If you must use wax, use non-skid floor wax. Do not have throw rugs and other things on the floor that can make you trip. What can I do with my stairs? Do not leave any items on the stairs. Make sure that there are handrails on both sides of the stairs and use them. Fix handrails that are broken or loose. Make sure that handrails are as long as the stairways. Check any carpeting to make sure that it is firmly attached to the stairs. Fix any carpet that is loose or worn. Avoid having throw rugs at the top or bottom of the stairs. If you do have throw rugs, attach them to the floor with carpet tape. Make sure that you have a  light switch at the top of the stairs and the bottom of the stairs. If you do not have them, ask someone to add them for you. What else can I do to help prevent falls? Wear shoes that: Do not have high heels. Have rubber bottoms. Are comfortable and fit you well. Are closed at the toe. Do not wear sandals. If you use a stepladder: Make sure that it is fully opened. Do not climb a closed stepladder. Make sure that both sides of the stepladder are locked into place. Ask someone to hold it for you, if possible. Clearly mark and make sure that you can see: Any grab bars or handrails. First and last steps. Where the edge of each step is. Use tools that help you move around (mobility aids) if they are needed. These  include: Canes. Walkers. Scooters. Crutches. Turn on the lights when you go into a dark area. Replace any light bulbs as soon as they burn out. Set up your furniture so you have a clear path. Avoid moving your furniture around. If any of your floors are uneven, fix them. If there are any pets around you, be aware of where they are. Review your medicines with your doctor. Some medicines can make you feel dizzy. This can increase your chance of falling. Ask your doctor what other things that you can do to help prevent falls. This information is not intended to replace advice given to you by your health care provider. Make sure you discuss any questions you have with your health care provider. Document Released: 11/03/2008 Document Revised: 06/15/2015 Document Reviewed: 02/11/2014 Elsevier Interactive Patient Education  2017 [employer].

## 2021-08-01 NOTE — Progress Notes (Signed)
Subjective:   Alexander [name] is a 76 y.o. male who presents for an Initial Medicare Annual Wellness Visit.   I connected with Alexander [name]  today by telephone and verified that I am speaking with the correct person using two identifiers. Location patient: home Location provider: work Persons participating in the virtual visit: patient, provider.   I discussed the limitations, risks, security and privacy concerns of performing an evaluation and management service by telephone and the availability of in person appointments. I also discussed with the patient that there may be a patient responsible charge related to this service. The patient expressed understanding and verbally consented to this telephonic visit.    Interactive audio and video telecommunications were attempted between this provider and patient, however failed, due to patient having technical difficulties OR patient did not have access to video capability.  We continued and completed visit with audio only.    Review of Systems     Cardiac Risk Factors include: advanced age (>18mn, >>104women);male gender     Objective:    Today's Vitals   There is no height or weight on file to calculate BMI.     08/01/2021    9:18 AM 07/02/2021    9:10 AM 06/22/2020    8:40 AM 06/06/2020    7:57 AM 05/10/2020    9:38 AM 08/07/2019    8:37 PM 11/11/2018    8:04 AM  Advanced Directives  Does Patient Have a Medical Advance Directive? Yes No No Yes No No   [place] of [place]   [place]    Does patient want to make changes to medical advance directive?   No - Patient declined No - Patient declined     Copy of [place] Chart? No - copy requested        Would patient like information on creating a medical advance directive?     Yes (MAU/Ambulatory/Procedural Areas - Information given)  No - Patient declined;Yes (MAU/Ambulatory/Procedural Areas -  Information given)    Current Medications (verified) Outpatient Encounter Medications as of 08/01/2021  Medication Sig   Accu-Chek Softclix Lancets lancets Use once daily if symptoms of low blood sugar   amLODipine (NORVASC) 10 MG tablet TAKE 1 TABLET BY MOUTH  DAILY   aspirin EC 81 MG tablet Take 81 mg by mouth daily as needed for mild pain.    atorvastatin (LIPITOR) 40 MG tablet TAKE 1 TABLET BY MOUTH DAILY   blood glucose meter kit and supplies Dispense based on patient and insurance preference. Once per day if symptoms of low blood sugar. Meter of choice per insurance coverage.   Blood Glucose Monitoring Suppl (ACCU-CHEK GUIDE) w/Device KIT Use once daily if symptoms of low blood sugar   Cholecalciferol (VITAMIN D3) 50 MCG (2000 UT) TABS Take by mouth.   diphenhydrAMINE (BENADRYL) 25 MG tablet Take 25 mg by mouth every 6 (six) hours as needed.   EPINEPHrine (EPIPEN 2-PAK) 0.3 mg/0.3 mL IJ SOAJ injection Inject 0.3 mLs (0.3 mg total) into the muscle as needed for anaphylaxis.   fexofenadine (ALLEGRA) 180 MG tablet Take 1 tablet (180 mg total) by mouth daily.   fluticasone (FLONASE) 50 MCG/ACT nasal spray Place 2 sprays into both nostrils daily.   gabapentin (NEURONTIN) 300 MG capsule TAKE 3 CAPSULES BY MOUTH  TWICE DAILY   glipiZIDE (GLUCOTROL) 5 MG tablet Take 0.5 tablets (2.5 mg total) by mouth 2 (two)  times daily before a meal.   glucose blood test strip Use once daily if symptoms of low blood sugar   HYDROcodone-acetaminophen (NORCO/VICODIN) 5-325 MG tablet Take 1 tablet by mouth every 6 (six) hours as needed for moderate pain.   metFORMIN (GLUCOPHAGE) 1000 MG tablet Take 1 tablet (1,000 mg total) by mouth 2 (two) times daily with a meal.   montelukast (SINGULAIR) 10 MG tablet Take 1 tablet (10 mg total) by mouth daily.   moxifloxacin (VIGAMOX) 0.5 % ophthalmic solution Place 1 drop into the right eye (operative eye) 4 (four) times daily. Start after surgery   sildenafil (VIAGRA) 100  MG tablet Take 100 mg by mouth as needed for erectile dysfunction.    Sod Fluoride-Potassium Nitrate (SODIUM FLUORIDE 5000 SENSITIVE) 1.1-5 % GEL Brush twice daily with toothpaste and spit out. Do not rinse, eat, or drink after using   tiZANidine (ZANAFLEX) 2 MG tablet Take 1 tablet (2 mg total) by mouth every 6 (six) hours as needed for muscle spasms.   triamcinolone cream (KENALOG) 0.1 % APPLY TOPICALLY TWICE DAILY   No facility-administered encounter medications on file as of 08/01/2021.    Allergies (verified) Lisinopril   History: Past Medical History:  Diagnosis Date   Angio-edema    CAD (coronary artery disease)    Per [facility] New Patient Packet    Carpal tunnel syndrome    Per [facility] New Patient Packet    Diabetes mellitus without complication ([place])    Hypertension    Neuromuscular disorder ([place])    Peripheral arterial disease ([place]) 11/14/2016   non-occlusive, asymptomatic, seen by vascular surgery Dr. Oneida Alar who rec repeated ABIs annually   Prostate cancer [facility])    Past Surgical History:  Procedure Laterality Date   CATARACT EXTRACTION Right 08/23/2020   COLON SURGERY     "locked bowel" fixed   COLONOSCOPY  2019   HERNIA REPAIR     LEFT HEART CATHETERIZATION WITH CORONARY ANGIOGRAM N/A 09/15/2013   Procedure: LEFT HEART CATHETERIZATION WITH CORONARY ANGIOGRAM;  Surgeon: Peter M Martinique, MD;  Location: [facility] CATH LAB;  Service: Cardiovascular;  Laterality: N/A;   PROSTATE SURGERY  2005   Family History  Problem Relation Age of Onset   Diabetes Mother    Depression Father    Hypertension Father    Diabetes Father    Thyroid disease Father    Hypertension Sister    Diabetes Sister    Congestive Heart Failure Sister    Kidney failure Sister    Diabetes Sister    Lung cancer Brother    Drug abuse Brother    Breast cancer Neg Hx    Prostate cancer Neg Hx    Colon cancer Neg Hx    Social History   Socioeconomic History   Marital status: Widowed    Spouse name: Not on  file   Number of children: 2   Years of education: 9   Highest education level: Not on file  Occupational History   Occupation: retired    Comment: Diplomatic Services operational officer  Tobacco Use   Smoking status: Every Day    Packs/day: 1.00    Years: 55.00    Total pack years: 55.00    Types: Cigarettes   Smokeless tobacco: Never  Vaping Use   Vaping Use: Never used  Substance and Sexual Activity   Alcohol use: No   Drug use: No   Sexual activity: Yes  Other Topics Concern   Not on file  Social History Narrative  Patient is single and his daughter lives with him.   Patient has two children.   Patient has a 9th grade education.   Patient works in Architect (part-time).   Patient drinks one to two cups of coffee daily.   Patient is right-handed.         Per [facility] New Patient Packet:   Diet:Left blank      Caffeine:Yes      Married, if yes what year: Widowed      Do you live in a house, apartment, assisted living, condo, trailer, ect: House, 3 persons      Is it one or more stories: One      Pets: No      Current/Past profession: Interior and spatial designer      Highest level or education completed:       Exercise:         No         Type and how often:          Living Will: No   DNR: No   POA/HPOA: No      Functional Status:   Do you have difficulty bathing or dressing yourself? No   Do you have difficulty preparing food or eating? No   Do you have difficulty managing your medications? No   Do you have difficulty managing your finances? No   Do you have difficulty affording your medications? No   Social Determinants of Health   Financial Resource Strain: Low Risk  (08/01/2021)   Overall Financial Resource Strain (CARDIA)    Difficulty of Paying Living Expenses: Not hard at all  Food Insecurity: No Food Insecurity (08/01/2021)   Hunger Vital Sign    Worried About Running Out of Food in the Last Year: Never true    Ran Out of Food in the Last Year: Never true  Transportation Needs: No  Transportation Needs (08/01/2021)   PRAPARE - Hydrologist (Medical): No    Lack of Transportation (Non-Medical): No  Physical Activity: Sufficiently Active (08/01/2021)   Exercise Vital Sign    Days of Exercise per Week: 5 days    Minutes of Exercise per Session: 30 min  Stress: No Stress Concern Present (08/01/2021)   [place]    Feeling of Stress : Not at all  Social Connections: Moderately Isolated (08/01/2021)   Social Connection and Isolation Panel [NHANES]    Frequency of Communication with Friends and Family: Twice a week    Frequency of Social Gatherings with Friends and Family: Twice a week    Attends Religious Services: More than 4 times per year    Active Member of [employer] or Organizations: No    Attends Archivist Meetings: Never    Marital Status: Widowed    Tobacco Counseling Ready to quit: Not Answered Counseling given: Not Answered   Clinical Intake:  Pre-visit preparation completed: Yes  Pain : No/denies pain     Nutritional Risks: None Diabetes: Yes CBG done?: No Did pt. bring in CBG monitor from home?: No  How often do you need to have someone help you when you read instructions, pamphlets, or other written materials from your doctor or pharmacy?: 2 - Rarely What is the last grade level you completed in school?: 9 th grade  Diabetic?yes Nutrition Risk Assessment:  Has the patient had any N/V/D within the last 2 months?  No  Does the patient have any non-healing  wounds?  No  Has the patient had any unintentional weight loss or weight gain?  No   Diabetes:  Is the patient diabetic?  Yes  If diabetic, was a CBG obtained today?  No  Did the patient bring in their glucometer from home?  No  How often do you monitor your CBG's? Never   Financial Strains and Diabetes Management:  Are you having any financial strains with the device, your supplies  or your medication? No .  Does the patient want to be seen by Chronic Care Management for management of their diabetes?  No  Would the patient like to be referred to a Nutritionist or for Diabetic Management?  No   Diabetic Exams:  Diabetic Eye Exam: Completed 06/2021 Diabetic Foot Exam: Overdue, Pt has been advised about the importance in completing this exam. Pt is scheduled for diabetic foot exam on next office visit .   Interpreter Needed?: No  Information entered by :: [email or web address]   Activities of Daily Living    08/01/2021    9:21 AM  In your present state of health, do you have any difficulty performing the following activities:  Hearing? 0  Vision? 0  Difficulty concentrating or making decisions? 0  Walking or climbing stairs? 0  Dressing or bathing? 0  Doing errands, shopping? 0  Preparing Food and eating ? N  Using the Toilet? N  In the past six months, have you accidently leaked urine? N  Do you have problems with loss of bowel control? N  Managing your Medications? N  Managing your Finances? N  Housekeeping or managing your Housekeeping? N    Patient Care Team: Wendie Agreste, MD as PCP - General (Family Medicine) Carol Ada, MD as Consulting Physician (Gastroenterology) Warden Fillers, MD as Consulting Physician (Ophthalmology) Pamala Hurry, MD as Referring Physician (Urology) Cira Rue, RN Nurse Navigator as Registered Nurse (Medical Oncology) Jola Schmidt, MD as Consulting Physician (Ophthalmology) Marlaine Hind, MD as Consulting Physician (Physical Medicine and Rehabilitation) Laurin Coder, MD as Consulting Physician (Pulmonary Disease) Tat, Eustace Quail, DO as Consulting Physician (Neurology)  Indicate any recent Medical Services you may have received from other than [facility] providers in the past year (date may be approximate).     Assessment:   This is a routine wellness examination for [place].  Hearing/Vision screen Vision  Screening - Comments:: Annual eye exams  wears glasses   Dietary issues and exercise activities discussed: Current Exercise Habits: Home exercise routine, Type of exercise: walking, Time (Minutes): 30, Frequency (Times/Week): 5, Weekly Exercise (Minutes/Week): 150, Intensity: Mild, Exercise limited by: None identified   Goals Addressed   None    Depression Screen    08/01/2021    9:18 AM 08/01/2021    9:17 AM 06/14/2021   10:22 AM 03/23/2021   10:42 AM 12/21/2020   11:33 AM 10/30/2020    1:09 PM 10/19/2020    9:01 AM  PHQ 2/9 Scores  PHQ - 2 Score 0 0 0 0 0 0 0  PHQ- 9 Score      0 0    Fall Risk    08/01/2021    9:18 AM 07/02/2021    9:10 AM 06/14/2021   10:21 AM 03/23/2021   10:42 AM 12/21/2020   11:32 AM  Fall Risk   Falls in the past year? 0 0 0 0 0  Number falls in past yr: 0 0 0 0 0  Injury with Fall? 0 0 0 0 0  Risk for fall due to :   No Fall Risks No Fall Risks No Fall Risks  Follow up Falls evaluation completed;Education provided  Falls evaluation completed Falls evaluation completed Falls evaluation completed    [place]:  Any stairs in or around the home? No  If so, are there any without handrails? No  Home free of loose throw rugs in walkways, pet beds, electrical cords, etc? Yes  Adequate lighting in your home to reduce risk of falls? Yes   ASSISTIVE DEVICES UTILIZED TO PREVENT FALLS:  Life alert? No  Use of a cane, walker or w/c? No  Grab bars in the bathroom? No  Shower chair or bench in shower? No  Elevated toilet seat or a handicapped toilet? No    Cognitive Function:  Normal cognitive status assessed by telephone conversation  by this Nurse Health Advisor. No abnormalities found.        06/22/2020    8:40 AM 11/11/2018    8:05 AM 10/28/2016    9:28 AM  6CIT Screen  What Year? 0 points 0 points 0 points  What month? 0 points 0 points 0 points  What time? 0 points 0 points 0 points  Count back from 20 0 points 0  points 0 points  Months in reverse 4 points 0 points 4 points  Repeat phrase 2 points 0 points 2 points  Total Score 6 points 0 points 6 points    Immunizations Immunization History  Administered Date(s) Administered   PFIZER(Purple Top)SARS-COV-2 Vaccination 03/03/2019, 03/24/2019, 10/26/2019   Td 07/17/2014    TDAP status: Up to date  Flu Vaccine status: Declined, Education has been provided regarding the importance of this vaccine but patient still declined. Advised may receive this vaccine at local pharmacy or Health Dept. Aware to provide a copy of the vaccination record if obtained from local pharmacy or Health Dept. Verbalized acceptance and understanding.  Pneumococcal vaccine status: Due, Education has been provided regarding the importance of this vaccine. Advised may receive this vaccine at local pharmacy or Health Dept. Aware to provide a copy of the vaccination record if obtained from local pharmacy or Health Dept. Verbalized acceptance and understanding.  Covid-19 vaccine status: Completed vaccines  Qualifies for Shingles Vaccine? Yes   Zostavax completed No   Shingrix Completed?: No.    Education has been provided regarding the importance of this vaccine. Patient has been advised to call insurance company to determine out of pocket expense if they have not yet received this vaccine. Advised may also receive vaccine at local pharmacy or Health Dept. Verbalized acceptance and understanding.  Screening Tests Health Maintenance  Topic Date Due   Zoster Vaccines- Shingrix (1 of 2) Never done   COVID-19 Vaccine (4 - Booster for Pfizer series) 12/21/2019   Pneumonia Vaccine 19+ Years old (1 - PCV) 12/21/2021 (Originally 03/20/1951)   INFLUENZA VACCINE  08/21/2021   FOOT EXAM  09/11/2021   HEMOGLOBIN A1C  09/23/2021   URINE MICROALBUMIN  10/30/2021   OPHTHALMOLOGY EXAM  07/21/2022   COLONOSCOPY (Pts 45-55yr Insurance coverage will need to be confirmed)  05/31/2023    TETANUS/TDAP  07/16/2024   Hepatitis C Screening  Completed   HPV VACCINES  Aged Out    Health Maintenance  Health Maintenance Due  Topic Date Due   Zoster Vaccines- Shingrix (1 of 2) Never done   COVID-19 Vaccine (4 - Booster for Pfizer series) 12/21/2019    Colorectal cancer screening: No longer  required.   Lung Cancer Screening: (Low Dose CT Chest recommended if Age 29-80 years, 30 pack-year currently smoking OR have quit w/in 15years.) does not qualify.   Lung Cancer Screening Referral: n/a  Additional Screening:  Hepatitis C Screening: does not qualify;   Vision Screening: Recommended annual ophthalmology exams for early detection of glaucoma and other disorders of the eye. Is the patient up to date with their annual eye exam?  Yes  Who is the provider or what is the name of the office in which the patient attends annual eye exams? [facility] If pt is not established with a provider, would they like to be referred to a provider to establish care? No .   Dental Screening: Recommended annual dental exams for proper oral hygiene  Community Resource Referral / Chronic Care Management: CRR required this visit?  No   CCM required this visit?  No      Plan:     I have personally reviewed and noted the following in the patient's chart:   Medical and social history Use of alcohol, tobacco or illicit drugs  Current medications and supplements including opioid prescriptions. Patient is not currently taking opioid prescriptions. Functional ability and status Nutritional status Physical activity Advanced directives List of other physicians Hospitalizations, surgeries, and ER visits in previous 12 months Vitals Screenings to include cognitive, depression, and falls Referrals and appointments  In addition, I have reviewed and discussed with patient certain preventive protocols, quality metrics, and best practice recommendations. A written personalized care plan for  preventive services as well as general preventive health recommendations were provided to patient.     Randel Pigg, LPN   1/42/3200   Nurse Notes: none

## 2021-08-02 ENCOUNTER — Other Ambulatory Visit: Payer: Self-pay

## 2021-08-02 DIAGNOSIS — E1159 Type 2 diabetes mellitus with other circulatory complications: Secondary | ICD-10-CM

## 2021-08-02 MED ORDER — METFORMIN HCL 1000 MG PO TABS: 1000.0000 mg | ORAL_TABLET | Freq: Two times a day (BID) | ORAL | 1 refills | Status: DC

## 2021-09-06 ENCOUNTER — Ambulatory Visit (HOSPITAL_BASED_OUTPATIENT_CLINIC_OR_DEPARTMENT_OTHER)
Admission: RE | Admit: 2021-09-06 | Discharge: 2021-09-06 | Disposition: A | Discharge status: Home / Self Care | Payer: Medicare Other | Source: Ambulatory Visit | Attending: Critical Care Medicine | Admitting: Critical Care Medicine

## 2021-09-06 DIAGNOSIS — Z87891 Personal history of nicotine dependence: Secondary | ICD-10-CM | POA: Diagnosis not present

## 2021-09-06 DIAGNOSIS — F17210 Nicotine dependence, cigarettes, uncomplicated: Secondary | ICD-10-CM | POA: Insufficient documentation

## 2021-09-07 ENCOUNTER — Other Ambulatory Visit: Payer: Self-pay | Admitting: Acute Care

## 2021-09-07 DIAGNOSIS — F17210 Nicotine dependence, cigarettes, uncomplicated: Secondary | ICD-10-CM

## 2021-09-07 DIAGNOSIS — Z87891 Personal history of nicotine dependence: Secondary | ICD-10-CM

## 2021-09-07 DIAGNOSIS — Z122 Encounter for screening for malignant neoplasm of respiratory organs: Secondary | ICD-10-CM

## 2021-09-27 ENCOUNTER — Encounter: Payer: Self-pay | Admitting: Family Medicine

## 2021-09-27 ENCOUNTER — Ambulatory Visit (INDEPENDENT_AMBULATORY_CARE_PROVIDER_SITE_OTHER): Payer: Medicare Other | Admitting: Family Medicine

## 2021-09-27 VITALS — BP 118/66 | HR 83 | Temp 98.1°F | Ht 65.0 in | Wt 124.6 lb

## 2021-09-27 DIAGNOSIS — R634 Abnormal weight loss: Secondary | ICD-10-CM | POA: Diagnosis not present

## 2021-09-27 DIAGNOSIS — J310 Chronic rhinitis: Secondary | ICD-10-CM

## 2021-09-27 DIAGNOSIS — E1159 Type 2 diabetes mellitus with other circulatory complications: Secondary | ICD-10-CM

## 2021-09-27 DIAGNOSIS — R159 Full incontinence of feces: Secondary | ICD-10-CM

## 2021-09-27 DIAGNOSIS — I10 Essential (primary) hypertension: Secondary | ICD-10-CM | POA: Diagnosis not present

## 2021-09-27 DIAGNOSIS — E785 Hyperlipidemia, unspecified: Secondary | ICD-10-CM

## 2021-09-27 DIAGNOSIS — M4800 Spinal stenosis, site unspecified: Secondary | ICD-10-CM

## 2021-09-27 DIAGNOSIS — M5416 Radiculopathy, lumbar region: Secondary | ICD-10-CM | POA: Diagnosis not present

## 2021-09-27 DIAGNOSIS — R32 Unspecified urinary incontinence: Secondary | ICD-10-CM

## 2021-09-27 LAB — COMPREHENSIVE METABOLIC PANEL
ALT: 8 U/L (ref 0–53)
AST: 13 U/L (ref 0–37)
Albumin: 4 g/dL (ref 3.5–5.2)
Alkaline Phosphatase: 112 U/L (ref 39–117)
BUN: 15 mg/dL (ref 6–23)
CO2: 27 mEq/L (ref 19–32)
Calcium: 9.8 mg/dL (ref 8.4–10.5)
Chloride: 103 mEq/L (ref 96–112)
Creatinine, Ser: 1.11 mg/dL (ref 0.40–1.50)
GFR: 64.95 mL/min (ref >60.00)
Glucose, Bld: 104 mg/dL — ABNORMAL HIGH (ref 70–99)
Potassium: 4.5 mEq/L (ref 3.5–5.1)
Sodium: 139 mEq/L (ref 135–145)
Total Bilirubin: 0.3 mg/dL (ref 0.2–1.2)
Total Protein: 7.7 g/dL (ref 6.0–8.3)

## 2021-09-27 LAB — LIPID PANEL
Cholesterol: 109 mg/dL (ref 0–200)
HDL: 36.3 mg/dL — ABNORMAL LOW (ref >39.00)
LDL Cholesterol: 62 mg/dL (ref 0–99)
NonHDL: 73
Total CHOL/HDL Ratio: 3
Triglycerides: 55 mg/dL (ref 0.0–149.0)
VLDL: 11 mg/dL (ref 0.0–40.0)

## 2021-09-27 LAB — CBC
HCT: 40.1 % (ref 39.0–52.0)
Hemoglobin: 13 g/dL (ref 13.0–17.0)
MCHC: 32.3 g/dL (ref 30.0–36.0)
MCV: 91.7 fl (ref 78.0–100.0)
Platelets: 245 10*3/uL (ref 150.0–400.0)
RBC: 4.38 Mil/uL (ref 4.22–5.81)
RDW: 15.3 % (ref 11.5–15.5)
WBC: 6.1 10*3/uL (ref 4.0–10.5)

## 2021-09-27 LAB — HEMOGLOBIN A1C: Hgb A1c MFr Bld: 7.3 % — ABNORMAL HIGH (ref 4.6–6.5)

## 2021-09-27 MED ORDER — AMLODIPINE BESYLATE 10 MG PO TABS: 10.0000 mg | ORAL_TABLET | Freq: Every day | ORAL | 3 refills | Status: DC

## 2021-09-27 MED ORDER — METFORMIN HCL 1000 MG PO TABS: 1000.0000 mg | ORAL_TABLET | Freq: Two times a day (BID) | ORAL | 1 refills | Status: DC

## 2021-09-27 MED ORDER — MONTELUKAST SODIUM 10 MG PO TABS: 10.0000 mg | ORAL_TABLET | Freq: Every day | ORAL | 3 refills | Status: DC

## 2021-09-27 MED ORDER — GABAPENTIN 300 MG PO CAPS: 900.0000 mg | ORAL_CAPSULE | Freq: Two times a day (BID) | ORAL | 2 refills | Status: DC

## 2021-09-27 NOTE — Patient Instructions (Addendum)
Coordinate therapy for the urinary incontinence with your urologist.  Discuss the stool loss of control with your back specialist. If that becomes more frequent you need to be seen right away.  If tremor is increasing, follow up with Dr. Carles Collet.  If any concerns on labs I will let you know. No med changes for now.  Try to have 3 meals per day, with Ensure if needed. If weight continues to decrease, will need to look into other causes. Recheck in 1 month. Return to the clinic or go to the nearest emergency room if any of your symptoms worsen or new symptoms occur.

## 2021-09-27 NOTE — Progress Notes (Unsigned)
Subjective:  Patient ID: YADRIEL KERRIGAN, male    DOB: April 09, 1945  Age: 76 y.o. MRN: [id number]  CC:  Chief Complaint  Patient presents with   Diabetes    Pt states he is having a problem with leaking urine , pt is not fasting    HPI JANSSEN ZEE presents for   Diabetes: Complicated by prior hyperglycemia, microalbuminuria.  Slight elevated A1c last visit, continued on metformin 1000 mg twice daily.  Had previously decreased his glipizide dose to minimize hypoglycemic events. Glipizide dosage adjusted at his March visit with elevated A1c to 5 mg with largest meal, 2.5 mg with other meal. Taking half in am, full pill at night  Home readings - none. No hypoglycemia symptoms.  Microalbumin: 2.1 on 10/30/2020.  ACE inhibitor allergy with angioedema. Optho, foot exam, pneumovax: Pneumovax declined. Flu vaccine declined.  He is on Lipitor 40 mg daily for hyperlipidemia.  Amlodipine 10 mg daily for hypertension.  Lab Results  Component Value Date   HGBA1C 7.6 (H) 03/23/2021   HGBA1C 6.6 (A) 12/21/2020   HGBA1C 6.9 (H) 09/11/2020   Lab Results  Component Value Date   MICROALBUR 2.1 (H) 10/30/2020   LDLCALC 47 03/23/2021   CREATININE 0.97 03/23/2021   Urinary incontinence: Urinary leakage discussed at his March visit.  History of prostate cancer and followed by urology for rising PSA.  Due for follow-up visit at his March appointment with me.  Was seen by urology in May.  Notes reviewed.  Prostate cancer was stable, minimally increased PSA and variable since radiation treatment.  Plan for repeat PSA in 3 months and then 6 months with PSA.  Plan for restaging when PSA gets to 0.5.  Stress incontinence was listed as chronic, stable.  Option of another round of PT and also the AUS device. Reports repeat labs few weeks ago.  Plans to start therapy. Not interested in implanted device.  Still some stress incontinence. Wearing protective undergarment.  Some stool incontinence in past -  episode few weeks ago - unable to get to bathroom in time. No constipation. Rare use of mineral oil if needed, but usually not constipated.   History of severe lumbar spinal stenosis with hyperreflexia of the lower extremities, evaluated in June by neurology, has back specialist, Dr. Assunta Curtis, has received injections.  Right-sided carpal tunnel syndrome also discussed but declined treatment.  Option of follow-up with neuro if persistent tremor but none seen at the June 12 visit. Still some tremor at times.gabapentin working ok for neuropathy.   Singulair, flonase, allegra working well for allergies.   Weight loss: Reported at end of visit.  Eating regular meals - 2 meals everyday, sometimes misses lunch. Used Ensure in past, not recent. May not be eating as much as in past, and working during the day.  No fevers, night sweats, or new bone pain.  No new cough or abdominal pain. Colonoscopy May 2022.  Under care of prostate cancer at urology as above. 3# change since June, 10# loss since March.  Wt Readings from Last 3 Encounters:  09/27/21 124 lb 9.6 oz (56.5 kg)  09/06/21 132 lb (59.9 kg)  07/02/21 127 lb 12.8 oz (58 kg)      History Patient Active Problem List   Diagnosis Date Noted   Right carpal tunnel syndrome 06/08/2019   Malignant neoplasm of prostate ([place]) 10/27/2018   Allergic reaction 08/10/2018   Angioedema 08/10/2018   Peripheral arterial occlusive disease ([place]) 10/28/2016   Hyperlipidemia LDL  goal <70 05/01/2015   Tobacco use disorder 12/09/2013   Coronary artery disease involving native coronary artery of native heart without angina pectoris 12/09/2013   Erectile dysfunction due to diseases classified elsewhere 12/09/2013   Type 2 diabetes mellitus with other circulatory complications ([place]) 71/06/2692   Left lumbar radiculopathy 09/30/2012   Chronic rhinitis 04/08/2011   Hypertension    HTN (hypertension), benign 03/09/2011   History of prostate cancer 03/09/2011    Past Medical History:  Diagnosis Date   Angio-edema    CAD (coronary artery disease)    Per [place] New Patient Packet    Carpal tunnel syndrome    Per [place] New Patient Packet    Diabetes mellitus without complication ([place])    Hypertension    Neuromuscular disorder ([place])    Peripheral arterial disease ([place]) 11/14/2016   non-occlusive, asymptomatic, seen by vascular surgery Dr. Oneida Alar who rec repeated ABIs annually   Prostate cancer [facility])    Past Surgical History:  Procedure Laterality Date   CATARACT EXTRACTION Right 08/23/2020   COLON SURGERY     "locked bowel" fixed   COLONOSCOPY  2019   HERNIA REPAIR     LEFT HEART CATHETERIZATION WITH CORONARY ANGIOGRAM N/A 09/15/2013   Procedure: LEFT HEART CATHETERIZATION WITH CORONARY ANGIOGRAM;  Surgeon: Peter M Martinique, MD;  Location[facility] CATH LAB;  Service: Cardiovascular;  Laterality: N/A;   PROSTATE SURGERY  2005   Allergies  Allergen Reactions   Lisinopril Swelling    angioedema   Prior to Admission medications   Medication Sig Start Date End Date Taking? Authorizing Provider  Accu-Chek Softclix Lancets lancets Use once daily if symptoms of low blood sugar 10/19/20  Yes Wendie Agreste, MD  amLODipine (NORVASC) 10 MG tablet TAKE 1 TABLET BY MOUTH  DAILY 01/25/21  Yes Wendie Agreste, MD  aspirin EC 81 MG tablet Take 81 mg by mouth daily as needed for mild pain.    Yes [provider]  blood glucose meter kit and supplies Dispense based on patient and insurance preference. Once per day if symptoms of low blood sugar. Meter of choice per insurance coverage. 10/19/20  Yes Wendie Agreste, MD  Blood Glucose Monitoring Suppl (ACCU-CHEK GUIDE) w/Device KIT Use once daily if symptoms of low blood sugar 10/19/20  Yes Wendie Agreste, MD  Cholecalciferol (VITAMIN D3) 50 MCG (2000 UT) TABS Take by mouth.   Yes [provider]  diphenhydrAMINE (BENADRYL) 25 MG tablet Take 25 mg by mouth every 6 (six) hours as needed.   Yes  [provider]  EPINEPHrine (EPIPEN 2-PAK) 0.3 mg/0.3 mL IJ SOAJ injection Inject 0.3 mLs (0.3 mg total) into the muscle as needed for anaphylaxis. 08/10/18  Yes Bobbitt, [place] Muta, MD  fexofenadine (ALLEGRA) 180 MG tablet Take 1 tablet (180 mg total) by mouth daily. 08/10/18  Yes Bobbitt, [place] Muta, MD  fluticasone [facility]) 50 MCG/ACT nasal spray Place 2 sprays into both nostrils daily. 03/18/19  Yes Jacelyn Pi, Lilia Argue, MD  gabapentin (NEURONTIN) 300 MG capsule TAKE 3 CAPSULES BY MOUTH  TWICE DAILY 06/20/21  Yes Wendie Agreste, MD  glipiZIDE (GLUCOTROL) 5 MG tablet Take 0.5 tablets (2.5 mg total) by mouth 2 (two) times daily before a meal. 03/23/21  Yes Wendie Agreste, MD  glucose blood test strip Use once daily if symptoms of low blood sugar 10/19/20  Yes   HYDROcodone-acetaminophen (NORCO/VICODIN) 5-325 MG tablet Take 1 tablet by mouth every 6 (six) hours as needed for  moderate pain. 03/23/21  Yes Wendie Agreste, MD  metFORMIN (GLUCOPHAGE) 1000 MG tablet Take 1 tablet (1,000 mg total) by mouth 2 (two) times daily with a meal. 08/02/21  Yes Wendie Agreste, MD  Sod Fluoride-Potassium Nitrate (SODIUM FLUORIDE 5000 SENSITIVE) 1.1-5 % GEL Brush twice daily with toothpaste and spit out. Do not rinse, eat, or drink after using 02/01/21  Yes   tiZANidine (ZANAFLEX) 2 MG tablet Take 1 tablet (2 mg total) by mouth every 6 (six) hours as needed for muscle spasms. 06/14/21  Yes Wendie Agreste, MD  triamcinolone cream (KENALOG) 0.1 % APPLY TOPICALLY TWICE DAILY 06/06/21  Yes Wendie Agreste, MD  atorvastatin (LIPITOR) 40 MG tablet TAKE 1 TABLET BY MOUTH DAILY Patient not taking: Reported on 09/27/2021 06/06/21   Wendie Agreste, MD  montelukast (SINGULAIR) 10 MG tablet Take 1 tablet (10 mg total) by mouth daily. Patient not taking: Reported on 09/27/2021 09/11/20   Wendie Agreste, MD  moxifloxacin (VIGAMOX) 0.5 % ophthalmic solution Place 1 drop into the right eye (operative eye) 4  (four) times daily. Start after surgery Patient not taking: Reported on 09/27/2021 08/08/20     sildenafil (VIAGRA) 100 MG tablet Take 100 mg by mouth as needed for erectile dysfunction.  Patient not taking: Reported on 09/27/2021    [provider]   Social History   Socioeconomic History   Marital status: Widowed    Spouse name: Not on file   Number of children: 2   Years of education: 9   Highest education level: Not on file  Occupational History   Occupation: retired    Comment: Diplomatic Services operational officer  Tobacco Use   Smoking status: Every Day    Packs/day: 1.00    Years: 55.00    Total pack years: 55.00    Types: Cigarettes   Smokeless tobacco: Never  Vaping Use   Vaping Use: Never used  Substance and Sexual Activity   Alcohol use: No   Drug use: No   Sexual activity: Yes  Other Topics Concern   Not on file  Social History Narrative   Patient is single and his daughter lives with him.   Patient has two children.   Patient has a 9th grade education.   Patient works in Architect (part-time).   Patient drinks one to two cups of coffee daily.   Patient is right-handed.         Per [facility] New Patient Packet:   Diet:Left blank      Caffeine:Yes      Married, if yes what year: Widowed      Do you live in a house, apartment, assisted living, condo, trailer, ect: House, 3 persons      Is it one or more stories: One      Pets: No      Current/Past profession: Interior and spatial designer      Highest level or education completed:       Exercise:         No         Type and how often:          Living Will: No   DNR: No   POA/HPOA: No      Functional Status:   Do you have difficulty bathing or dressing yourself? No   Do you have difficulty preparing food or eating? No   Do you have difficulty managing your medications? No   Do you have difficulty managing your finances? No  Do you have difficulty affording your medications? No   Social Determinants of Health   Financial Resource  Strain: Low Risk  (08/01/2021)   Overall Financial Resource Strain (CARDIA)    Difficulty of Paying Living Expenses: Not hard at all  Food Insecurity: No Food Insecurity (08/01/2021)   Hunger Vital Sign    Worried About Running Out of Food in the Last Year: Never true    Ran Out of Food in the Last Year: Never true  Transportation Needs: No Transportation Needs (08/01/2021)   PRAPARE - Hydrologist (Medical): No    Lack of Transportation (Non-Medical): No  Physical Activity: Sufficiently Active (08/01/2021)   Exercise Vital Sign    Days of Exercise per Week: 5 days    Minutes of Exercise per Session: 30 min  Stress: No Stress Concern Present (08/01/2021)   [place]    Feeling of Stress : Not at all  Social Connections: Moderately Isolated (08/01/2021)   Social Connection and Isolation Panel [NHANES]    Frequency of Communication with Friends and Family: Twice a week    Frequency of Social Gatherings with Friends and Family: Twice a week    Attends Religious Services: More than 4 times per year    Active Member of [employer] or Organizations: No    Attends Archivist Meetings: Never    Marital Status: Widowed  Intimate Partner Violence: Not At Risk (08/01/2021)   Humiliation, Afraid, Rape, and Kick questionnaire    Fear of Current or Ex-Partner: No    Emotionally Abused: No    Physically Abused: No    Sexually Abused: No    Review of Systems  Constitutional:  Negative for fatigue and unexpected weight change.  Eyes:  Negative for visual disturbance.  Respiratory:  Negative for cough, chest tightness and shortness of breath.   Cardiovascular:  Negative for chest pain, palpitations and leg swelling.  Gastrointestinal:  Negative for abdominal pain and blood in stool.  Neurological:  Negative for dizziness, light-headedness and headaches.     Objective:   Vitals:   09/27/21 1024   BP: 118/66  Pulse: 83  Temp: 98.1 F (36.7 C)  SpO2: 93%  Weight: 124 lb 9.6 oz (56.5 kg)  Height: 5' 5" (1.651 m)     Physical Exam Vitals reviewed.  Constitutional:      Appearance: He is well-developed.  HENT:     Head: Normocephalic and atraumatic.  Neck:     Vascular: No carotid bruit or JVD.  Cardiovascular:     Rate and Rhythm: Normal rate and regular rhythm.     Heart sounds: Normal heart sounds. No murmur heard. Pulmonary:     Effort: Pulmonary effort is normal.     Breath sounds: Normal breath sounds. No rales.  Abdominal:     General: Abdomen is flat.     Palpations: Abdomen is soft.     Tenderness: There is no abdominal tenderness.  Musculoskeletal:     Right lower leg: No edema.     Left lower leg: No edema.  Skin:    General: Skin is warm and dry.  Neurological:     Mental Status: He is alert and oriented to person, place, and time.  Psychiatric:        Mood and Affect: Mood normal.        Assessment & Plan:  TANNEN VANDEZANDE is a 76 y.o. male . Type  2 diabetes mellitus with other circulatory complications ([place]) - Plan: Hemoglobin A1c, metFORMIN (GLUCOPHAGE) 1000 MG tablet  -Check updated A1c with medication adjustments accordingly.  Essential hypertension - Plan: Comprehensive metabolic panel, amLODipine (NORVASC) 10 MG tablet  -  Stable, tolerating current regimen. Medications refilled. Labs pending as above.   Hyperlipidemia LDL goal <70 - Plan: Lipid panel  Lumbar radiculopathy, chronic - Plan: gabapentin (NEURONTIN) 300 MG capsule  Chronic rhinitis - Plan: montelukast (SINGULAIR) 10 MG tablet  Spinal stenosis, unspecified spinal region  Weight loss - Plan: CBC  Urinary incontinence, unspecified type  Incontinence of feces, unspecified fecal incontinence type   Meds ordered this encounter  Medications   metFORMIN (GLUCOPHAGE) 1000 MG tablet    Sig: Take 1 tablet (1,000 mg total) by mouth 2 (two) times daily with a meal.     Dispense:  180 tablet    Refill:  1   gabapentin (NEURONTIN) 300 MG capsule    Sig: Take 3 capsules (900 mg total) by mouth 2 (two) times daily.    Dispense:  540 capsule    Refill:  2    Requesting 1 year supply   amLODipine (NORVASC) 10 MG tablet    Sig: Take 1 tablet (10 mg total) by mouth daily.    Dispense:  90 tablet    Refill:  3    Requesting 1 year supply   montelukast (SINGULAIR) 10 MG tablet    Sig: Take 1 tablet (10 mg total) by mouth daily.    Dispense:  90 tablet    Refill:  3   Patient Instructions  Coordinate therapy for the urinary incontinence with your urologist.  Discuss the stool loss of control with your back specialist. If that becomes more frequent you need to be seen right away.  If tremor is increasing, follow up with Dr. Carles Collet.  If any concerns on labs I will let you know. No med changes for now.  Try to have 3 meals per day, with Ensure if needed. If weight continues to decrease, will need to look into other causes. Recheck in 1 month. Return to the clinic or go to the nearest emergency room if any of your symptoms worsen or new symptoms occur.     Signed,   Merri Ray, MD [place] 09/27/21 10:37 AM

## 2021-10-01 ENCOUNTER — Telehealth: Payer: Self-pay | Admitting: Family Medicine

## 2021-10-01 DIAGNOSIS — M5416 Radiculopathy, lumbar region: Secondary | ICD-10-CM

## 2021-10-01 DIAGNOSIS — M4800 Spinal stenosis, site unspecified: Secondary | ICD-10-CM

## 2021-10-01 NOTE — Telephone Encounter (Signed)
Dr Marlaine Hind does not seem to practice anymore can we place a referral based off short conversation about lumbar radiculopathy from last OV?

## 2021-10-01 NOTE — Telephone Encounter (Signed)
Caller name: [place] Mayabb   On DPR? :yes/no: Yes  Call back number:[phone]  Provider they see:  [facility]   Reason for call:  Pt last appt was 09/27/21. Pt want a referral to see his back Doctor. Pt states that he seen Dr. Marlaine Hind in the [place] and he would like to see him again. Pt states that he don't where the Dr. Brien Few moved to. Please advise.  Should I schedule him another appt?

## 2021-10-02 NOTE — Telephone Encounter (Signed)
Called [facility] and Dr Brien Few did in fact switch to their practice he is taking new patients he would need a referral to be seen at their location

## 2021-10-02 NOTE — Telephone Encounter (Signed)
Referral ordered

## 2021-10-02 NOTE — Telephone Encounter (Signed)
It appears that Dr. Brien Few may be with [facility] at this time, [phone]. Could follow-up with him there, but let me know if a referral is needed.  Alternatively could call [facility] where he previously practiced to see if other provider has assumed care of his patients.  Let me know if I can help further.

## 2021-10-02 NOTE — Addendum Note (Signed)
Addended by: Merri Ray R on: 10/02/2021 01:36 PM   Modules accepted: Orders

## 2021-10-10 ENCOUNTER — Other Ambulatory Visit (HOSPITAL_COMMUNITY): Payer: Self-pay

## 2021-10-10 DIAGNOSIS — R310 Gross hematuria: Secondary | ICD-10-CM | POA: Diagnosis not present

## 2021-10-10 MED ORDER — AMOXICILLIN-POT CLAVULANATE 875-125 MG PO TABS
1.0000 | ORAL_TABLET | Freq: Two times a day (BID) | ORAL | 0 refills | Status: DC
  Filled 2021-10-10: qty 14, 7d supply, fill #0

## 2021-10-23 ENCOUNTER — Other Ambulatory Visit: Payer: Self-pay | Admitting: Family Medicine

## 2021-10-23 DIAGNOSIS — E1159 Type 2 diabetes mellitus with other circulatory complications: Secondary | ICD-10-CM

## 2021-10-24 ENCOUNTER — Other Ambulatory Visit: Payer: Self-pay | Admitting: Lab

## 2021-10-24 DIAGNOSIS — E1159 Type 2 diabetes mellitus with other circulatory complications: Secondary | ICD-10-CM

## 2021-10-24 MED ORDER — GLIPIZIDE 5 MG PO TABS: 2.5000 mg | ORAL_TABLET | Freq: Two times a day (BID) | ORAL | 3 refills | Status: DC

## 2021-10-29 ENCOUNTER — Ambulatory Visit: Payer: Medicare Other | Admitting: Family Medicine

## 2021-10-30 ENCOUNTER — Telehealth: Payer: Self-pay | Admitting: Family Medicine

## 2021-10-30 NOTE — Telephone Encounter (Signed)
Received Medical Clarification Request from [place]; Placed in Dr. Rolly Salter bin.

## 2021-10-31 NOTE — Telephone Encounter (Signed)
You can disregard this message, appears Mallie Darting had already faxed back a response found this while looking for other fax. Placed this copy on your area in the back if you wish to retain copy for any repeat faxes

## 2021-10-31 NOTE — Telephone Encounter (Signed)
It patients chart there are two prescriptions for Glipizide, one for 5 mg 2 times daily before a meal the other is for 2.5 mg 2 times daily before a meal. Please advise which is correct dosing and I can fax back information request last OV advised follow up 1 month patient is scheduled for 11/14/21

## 2021-11-05 DIAGNOSIS — R310 Gross hematuria: Secondary | ICD-10-CM | POA: Diagnosis not present

## 2021-11-05 DIAGNOSIS — R319 Hematuria, unspecified: Secondary | ICD-10-CM | POA: Diagnosis not present

## 2021-11-05 DIAGNOSIS — N281 Cyst of kidney, acquired: Secondary | ICD-10-CM | POA: Diagnosis not present

## 2021-11-14 ENCOUNTER — Ambulatory Visit: Payer: Medicare Other | Admitting: Family Medicine

## 2021-11-15 ENCOUNTER — Other Ambulatory Visit: Payer: Self-pay | Admitting: *Deleted

## 2021-11-15 DIAGNOSIS — I779 Disorder of arteries and arterioles, unspecified: Secondary | ICD-10-CM

## 2021-11-16 ENCOUNTER — Encounter: Payer: Medicare Other | Admitting: Family Medicine

## 2021-11-19 ENCOUNTER — Ambulatory Visit (HOSPITAL_COMMUNITY)
Admission: RE | Admit: 2021-11-19 | Discharge: 2021-11-19 | Disposition: A | Discharge status: Home / Self Care | Payer: Medicare Other | Source: Ambulatory Visit | Attending: Surgery | Admitting: Surgery

## 2021-11-19 DIAGNOSIS — I779 Disorder of arteries and arterioles, unspecified: Secondary | ICD-10-CM | POA: Insufficient documentation

## 2021-11-19 NOTE — Progress Notes (Unsigned)
[facility] [place]  ASSESSMENT / PLAN: Alexander [name] is a 76 y.o. male with atherosclerosis of native arteries of bilateral lower extremities causing no symptoms.  Patient counseled patients with asymptomatic peripheral arterial disease or claudication have a 1-2% risk of developing chronic limb threatening ischemia, but a 15-30% risk of mortality in the next 5 years. Intervention should only be considered for medically optimized patients with disabling symptoms.   Recommend the following which can slow the progression of atherosclerosis and reduce the risk of major adverse cardiac / limb events:  Complete cessation from all tobacco products. Blood glucose control with goal A1c < 7%. Blood pressure control with goal blood pressure < 140/90 mmHg. Lipid reduction therapy with goal LDL-C <100 mg/dL (<70 if symptomatic from PAD).  Aspirin 74m PO QD.  Atorvastatin 40-80mg PO QD (or other "high intensity" statin therapy).  Follow-up yearly with ankle-brachial index for surveillance.  CHIEF COMPLAINT: Atherosclerosis identified on CT scan.  HISTORY OF PRESENT ILLNESS: Alexander SCHUBACHis a 76y.o. male for to clinic for evaluation of atherosclerosis in the lower extremities incidentally discovered on recent CT scan.  The patient is asymptomatic from a peripheral arterial disease standpoint.  He is active and continues to work.  He reports he is unrestricted in his ability to walk.  He does not describe claudication type symptoms.  He has no symptoms of ischemic rest pain.  He has no ulcers about his feet.  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 FOneida Alarwho rec repeated ABIs annually   Prostate cancer ([facility]     Past Surgical History:  Procedure Laterality Date   CATARACT EXTRACTION Right 08/23/2020   COLON SURGERY     "locked bowel" fixed   COLONOSCOPY  2019   HERNIA REPAIR     LEFT HEART CATHETERIZATION WITH CORONARY ANGIOGRAM N/A 09/15/2013   Procedure: LEFT HEART CATHETERIZATION WITH CORONARY ANGIOGRAM;  Surgeon: Peter M JMartinique MD;  [facility];  Service: Cardiovascular;  Laterality: N/A;   PROSTATE SURGERY  2005    Family History  Problem Relation Age of Onset   Diabetes Mother    Depression Father    Hypertension Father    Diabetes Father    Thyroid disease Father    Hypertension Sister    Diabetes Sister    Congestive Heart Failure Sister    Kidney failure Sister    Diabetes Sister    Lung cancer Brother    Drug abuse Brother    Breast cancer Neg Hx    Prostate cancer Neg Hx    Colon cancer Neg Hx     Social History   Socioeconomic History   Marital status: Widowed    Spouse name: Not on file   Number of children: 2   Years of education: 9   Highest education level: Not on file  Occupational History   Occupation: retired    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   Patient has two children.   Patient has a 9th grade education.   Patient works in Architect (part-time).   Patient drinks one to two cups of coffee daily.   Patient is right-handed.         Per [facility] New Patient Packet:   Diet:Left blank      Caffeine:Yes      Married, if yes what year: Widowed      Do you live in a house, apartment, assisted living, condo, trailer, ect:  House, 3 persons      Is it one or more stories: One      Pets: No      Current/Past profession: Interior and spatial designer      Highest level or education completed:       Exercise:         No         Type and how often:          Living Will: No   DNR: No   POA/HPOA: No      Functional Status:   Do you have difficulty bathing or dressing yourself? No   Do you have difficulty preparing food or eating? No   Do you have difficulty managing your medications? No   Do you have difficulty managing your finances? No   Do you have difficulty affording your medications? No   Social Determinants of Health   Financial Resource Strain: Low Risk  (08/01/2021)   Overall Financial Resource Strain (CARDIA)    Difficulty of Paying Living Expenses: Not hard at all  Food Insecurity: No Food Insecurity (08/01/2021)   Hunger Vital Sign    Worried About Running Out of Food in the Last Year: Never true    Ran Out of Food in the Last Year: Never true  Transportation Needs: No Transportation Needs (08/01/2021)   PRAPARE - Hydrologist (Medical): No    Lack of Transportation (Non-Medical): No  Physical Activity: Sufficiently Active (08/01/2021)   Exercise Vital Sign    Days of Exercise per Week: 5 days    Minutes of Exercise per Session: 30 min  Stress: No Stress Concern Present (08/01/2021)   Alexander [name]    Feeling of Stress : Not at all  Social Connections: Moderately Isolated (08/01/2021)   Social Connection and Isolation Panel [NHANES]    Frequency of Communication with Friends and Family: Twice a week    Frequency of Social Gatherings with Friends and Family: Twice a week    Attends Religious Services: More than 4 times per year    Active Member of [employer] or Organizations: No    Attends Archivist Meetings: Never    Marital Status: Widowed  Intimate Partner Violence: Not At Risk (08/01/2021)   Humiliation,  Afraid, Rape, and Kick questionnaire    Fear of Current or Ex-Partner: No    Emotionally Abused: No    Physically Abused: No    Sexually Abused: No    Allergies  Allergen Reactions   Lisinopril Swelling    angioedema    Current Outpatient Medications  Medication Sig Dispense Refill   Accu-Chek Softclix Lancets lancets Use  once daily if symptoms of low blood sugar 100 each 0   amLODipine (NORVASC) 10 MG tablet Take 1 tablet (10 mg total) by mouth daily. 90 tablet 3   aspirin EC 81 MG tablet Take 81 mg by mouth daily as needed for mild pain.      atorvastatin (LIPITOR) 40 MG tablet TAKE 1 TABLET BY MOUTH DAILY (Patient not taking: Reported on 09/27/2021) 90 tablet 3   blood glucose meter kit and supplies Dispense based on patient and insurance preference. Once per day if symptoms of low blood sugar. Meter of choice per insurance coverage. 1 each 0   Blood Glucose Monitoring Suppl (ACCU-CHEK GUIDE) w/Device KIT Use once daily if symptoms of low blood sugar 1 kit 0   Cholecalciferol (VITAMIN D3) 50 MCG (2000 UT) TABS Take by mouth.     diphenhydrAMINE (BENADRYL) 25 MG tablet Take 25 mg by mouth every 6 (six) hours as needed.     EPINEPHrine (EPIPEN 2-PAK) 0.3 mg/0.3 mL IJ SOAJ injection Inject 0.3 mLs (0.3 mg total) into the muscle as needed for anaphylaxis. 2 each 2   fexofenadine (ALLEGRA) 180 MG tablet Take 1 tablet (180 mg total) by mouth daily. 30 tablet 5   fluticasone (FLONASE) 50 MCG/ACT nasal spray Place 2 sprays into both nostrils daily. 16 g 6   gabapentin (NEURONTIN) 300 MG capsule Take 3 capsules (900 mg total) by mouth 2 (two) times daily. 540 capsule 2   glipiZIDE (GLUCOTROL) 5 MG tablet TAKE 1 TABLET BY MOUTH TWICE  DAILY BEFORE A MEAL 200 tablet 2   glipiZIDE (GLUCOTROL) 5 MG tablet Take 0.5 tablets (2.5 mg total) by mouth 2 (two) times daily before a meal. 90 tablet 3   glucose blood test strip Use once daily if symptoms of low blood sugar 100 each 0    HYDROcodone-acetaminophen (NORCO/VICODIN) 5-325 MG tablet Take 1 tablet by mouth every 6 (six) hours as needed for moderate pain. 20 tablet 0   metFORMIN (GLUCOPHAGE) 1000 MG tablet Take 1 tablet (1,000 mg total) by mouth 2 (two) times daily with a meal. 180 tablet 1   Sod Fluoride-Potassium Nitrate (SODIUM FLUORIDE 5000 SENSITIVE) 1.1-5 % GEL Brush twice daily with toothpaste and spit out. Do not rinse, eat, or drink after using 100 mL 2   tiZANidine (ZANAFLEX) 2 MG tablet Take 1 tablet (2 mg total) by mouth every 6 (six) hours as needed for muscle spasms. 15 tablet 0   triamcinolone cream (KENALOG) 0.1 % APPLY TOPICALLY TWICE DAILY 45 g 0   No current facility-administered medications for this visit.    PHYSICAL EXAM Vitals:   11/20/21 1333  BP: (!) 146/82  Pulse: 65  Resp: 20  Temp: 98.3 F (36.8 C)  SpO2: 94%  Weight: 130 lb (59 kg)  Height: 5' 5" (1.651 m)    Elderly gentleman in no acute distress Regular rate and rhythm Unlabored breathing No palpable pedal pulses Feet are warm   PERTINENT LABORATORY AND RADIOLOGIC DATA  Most recent CBC    Latest Ref Rng & Units 09/27/2021   11:19 AM 05/10/2020   10:43 AM 08/10/2018   10:07 AM  CBC  WBC 4.0 - 10.5 K/uL 6.1  5.8  8.6   Hemoglobin 13.0 - 17.0 g/dL 13.0  13.9  13.5   Hematocrit 39.0 - 52.0 % 40.1  42.3  41.2   Platelets 150.0 - 400.0 K/uL 245.0  265  230      Most recent CMP  Latest Ref Rng & Units 09/27/2021   11:19 AM 03/23/2021   11:21 AM 09/11/2020    4:20 PM  CMP  Glucose 70 - 99 mg/dL 104  87  48   BUN 6 - 23 mg/dL _0 Creatinine 0.40 - 1.50 mg/dL 1.11  0.97  1.11   Sodium 135 - 145 mEq/L 139  137  139   Potassium 3.5 - 5.1 mEq/L 4.5  4.4  4.2   Chloride 96 - 112 mEq/L 103  103  106   CO2 19 - 32 mEq/L _1 Calcium 8.4 - 10.5 mg/dL 9.8  9.7  9.7   Total Protein 6.0 - 8.3 g/dL 7.7  7.6  7.4   Total Bilirubin 0.2 - 1.2 mg/dL 0.3  0.5  0.5   Alkaline Phos 39 - 117 U/L 112  126  104   AST 0  - 37 U/L _2 ALT 0 - 53 U/L _3 Renal function CrCl cannot be calculated (Patient's most recent lab result is older than the maximum 21 days allowed.).  Hgb A1c MFr Bld (%)  Date Value  09/27/2021 7.3 (H)    LDL Cholesterol (Calc)  Date Value Ref Range Status  05/10/2020 44 mg/dL (calc) Final    Comment:    Reference range: <100 . Desirable range <100 mg/dL for primary prevention;   <70 mg/dL for patients with CHD or diabetic patients  with > or = 2 CHD risk factors. Marland Kitchen LDL-C is now calculated using the Martin-Hopkins  calculation, which is a validated novel method providing  better accuracy than the Friedewald equation in the  estimation of LDL-C.  Cresenciano Genre et al. Annamaria Helling. 0109;323(55): 2061-2068  (http://education.[facility].com/faq/FAQ164)    LDL Cholesterol  Date Value Ref Range Status  09/27/2021 62 0 - 99 mg/dL Final    +-------+-----------+-----------+------------+------------+  ABI/TBIToday's ABIToday's TBIPrevious ABIPrevious TBI  +-------+-----------+-----------+------------+------------+  Right  0.49       0.21       0.68        0.40          +-------+-----------+-----------+------------+------------+  Left   0.65       0.40       0.68        0.39          +-------+-----------+-----------+------------+------------+  Yevonne Aline. Stanford Breed, MD FACS [facility] Phone Number: [phone] 11/20/2021 3:51 PM   Total time spent on preparing this encounter including chart review, data review, collecting history, examining the patient, coordinating care for this new patient, 60 minutes.  Portions of this report may have been transcribed using voice recognition software.  Every effort has been made to ensure accuracy; however, inadvertent computerized transcription errors may still be present.

## 2021-11-20 ENCOUNTER — Encounter: Payer: Self-pay | Admitting: Vascular Surgery

## 2021-11-20 ENCOUNTER — Ambulatory Visit: Payer: Medicare Other | Admitting: Vascular Surgery

## 2021-11-20 VITALS — BP 146/82 | HR 65 | Temp 98.3°F | Resp 20 | Ht 65.0 in | Wt 130.0 lb

## 2021-11-20 DIAGNOSIS — I739 Peripheral vascular disease, unspecified: Secondary | ICD-10-CM | POA: Diagnosis not present

## 2021-11-22 ENCOUNTER — Other Ambulatory Visit: Payer: Self-pay

## 2021-11-22 DIAGNOSIS — I779 Disorder of arteries and arterioles, unspecified: Secondary | ICD-10-CM

## 2021-12-19 ENCOUNTER — Other Ambulatory Visit: Payer: Self-pay | Admitting: Family Medicine

## 2021-12-19 DIAGNOSIS — E1159 Type 2 diabetes mellitus with other circulatory complications: Secondary | ICD-10-CM

## 2021-12-21 ENCOUNTER — Other Ambulatory Visit (HOSPITAL_COMMUNITY): Payer: Self-pay

## 2021-12-21 DIAGNOSIS — R310 Gross hematuria: Secondary | ICD-10-CM | POA: Diagnosis not present

## 2021-12-21 MED ORDER — SILDENAFIL CITRATE 100 MG PO TABS
100.0000 mg | ORAL_TABLET | ORAL | 3 refills | Status: AC | PRN
  Filled 2021-12-21: qty 10, 10d supply, fill #0

## 2021-12-24 ENCOUNTER — Other Ambulatory Visit: Payer: Self-pay

## 2021-12-24 DIAGNOSIS — M47816 Spondylosis without myelopathy or radiculopathy, lumbar region: Secondary | ICD-10-CM | POA: Diagnosis not present

## 2021-12-24 DIAGNOSIS — M48062 Spinal stenosis, lumbar region with neurogenic claudication: Secondary | ICD-10-CM | POA: Diagnosis not present

## 2021-12-26 ENCOUNTER — Other Ambulatory Visit (HOSPITAL_COMMUNITY): Payer: Self-pay

## 2022-01-25 DIAGNOSIS — F17200 Nicotine dependence, unspecified, uncomplicated: Secondary | ICD-10-CM | POA: Diagnosis not present

## 2022-01-25 DIAGNOSIS — E1165 Type 2 diabetes mellitus with hyperglycemia: Secondary | ICD-10-CM | POA: Diagnosis not present

## 2022-01-25 DIAGNOSIS — I10 Essential (primary) hypertension: Secondary | ICD-10-CM | POA: Diagnosis not present

## 2022-01-31 ENCOUNTER — Encounter: Payer: Self-pay | Admitting: Family Medicine

## 2022-01-31 ENCOUNTER — Ambulatory Visit (INDEPENDENT_AMBULATORY_CARE_PROVIDER_SITE_OTHER): Payer: Medicare Other | Admitting: Family Medicine

## 2022-01-31 VITALS — BP 130/76 | HR 73 | Temp 97.9°F | Ht 64.0 in | Wt 134.0 lb

## 2022-01-31 DIAGNOSIS — E785 Hyperlipidemia, unspecified: Secondary | ICD-10-CM | POA: Diagnosis not present

## 2022-01-31 DIAGNOSIS — M4800 Spinal stenosis, site unspecified: Secondary | ICD-10-CM

## 2022-01-31 DIAGNOSIS — E1159 Type 2 diabetes mellitus with other circulatory complications: Secondary | ICD-10-CM

## 2022-01-31 DIAGNOSIS — I10 Essential (primary) hypertension: Secondary | ICD-10-CM

## 2022-01-31 DIAGNOSIS — M5416 Radiculopathy, lumbar region: Secondary | ICD-10-CM | POA: Diagnosis not present

## 2022-01-31 DIAGNOSIS — L309 Dermatitis, unspecified: Secondary | ICD-10-CM

## 2022-01-31 DIAGNOSIS — M62838 Other muscle spasm: Secondary | ICD-10-CM | POA: Diagnosis not present

## 2022-01-31 LAB — BASIC METABOLIC PANEL
BUN: 11 mg/dL (ref 6–23)
CO2: 29 mEq/L (ref 19–32)
Calcium: 9.7 mg/dL (ref 8.4–10.5)
Chloride: 103 mEq/L (ref 96–112)
Creatinine, Ser: 1.14 mg/dL (ref 0.40–1.50)
GFR: 62.75 mL/min (ref >60.00)
Glucose, Bld: 150 mg/dL — ABNORMAL HIGH (ref 70–99)
Potassium: 4.1 mEq/L (ref 3.5–5.1)
Sodium: 138 mEq/L (ref 135–145)

## 2022-01-31 LAB — MICROALBUMIN / CREATININE URINE RATIO
Creatinine,U: 56.6 mg/dL
Microalb Creat Ratio: 2.2 mg/g (ref 0.0–30.0)
Microalb, Ur: 1.3 mg/dL (ref 0.0–1.9)

## 2022-01-31 LAB — HEMOGLOBIN A1C: Hgb A1c MFr Bld: 7.9 % — ABNORMAL HIGH (ref 4.6–6.5)

## 2022-01-31 MED ORDER — ATORVASTATIN CALCIUM 40 MG PO TABS: 40.0000 mg | ORAL_TABLET | Freq: Every day | ORAL | 3 refills | Status: DC

## 2022-01-31 MED ORDER — TRIAMCINOLONE ACETONIDE 0.1 % EX CREA: TOPICAL_CREAM | CUTANEOUS | 0 refills | Status: DC

## 2022-01-31 MED ORDER — HYDROCODONE-ACETAMINOPHEN 5-325 MG PO TABS: 1.0000 | ORAL_TABLET | Freq: Four times a day (QID) | ORAL | 0 refills | Status: DC | PRN

## 2022-01-31 MED ORDER — TIZANIDINE HCL 2 MG PO TABS: 2.0000 mg | ORAL_TABLET | Freq: Four times a day (QID) | ORAL | 0 refills | Status: DC | PRN

## 2022-01-31 NOTE — Progress Notes (Signed)
Subjective:  Patient ID: Alexander [name], male    DOB: Jul 10, 1945  Age: 76 y.o. MRN: [id number]  CC:  Chief Complaint  Patient presents with   Annual Exam    HPI Alexander [name] presents for   Chronic med follow-up. Last seen in September, planned on 1 month follow-up at that time. Labs were overall stable, 44-monthfollow-up discussed.  Diabetes: Complicated by hyperglycemia, microalbuminuria.  Treated with metformin 1000 mg twice daily.  Glipizide has been decreased to minimize hypoglycemia previously.  5 mg with largest meal, 2.5 mg with other meal.  No hypoglycemia noted at his last visit in September but A1c was slightly elevated at 7.3.  Also had noted some weight loss at that time.  Weight had decreased from 132 in August to 124 at September visit, but 127 noted on June 2023.  He is followed by urology for prostate cancer.  Surveillance with restaging when PSA gets to 0.5. Weight has improved - no further weight loss.  Not checking home readings - has meter.  No symptoms of low blood sugars.  No new med side effects.  Microalbumin: 2.1 in 2022. ACE-I allergy. Repeat testing today.  Optho, foot exam, pneumovax:  Foot exam today.  Pneumonia vaccine declined.  On statin with lipitor '40mg'$  qd.   Diabetic Foot Exam - Simple   Simple Foot Form Visual Inspection See comments: Yes Sensation Testing Intact to touch and monofilament testing bilaterally: Yes Pulse Check See comments: Yes Comments Dry skin, disocolored thickened nails - declined referral to podiatry for nail care.  Plantar callouses bilat, no wounds.  Difficult palpation of DP/PT pulses bilat.      Wt Readings from Last 3 Encounters:  01/31/22 134 lb (60.8 kg)  11/20/21 130 lb (59 kg)  09/27/21 124 lb 9.6 oz (56.5 kg)   Lab Results  Component Value Date   HGBA1C 7.9 (H) 01/31/2022   HGBA1C 7.3 (H) 09/27/2021   HGBA1C 7.6 (H) 03/23/2021   Lab Results  Component Value Date   MICROALBUR 1.3  01/31/2022   LDLCALC 62 09/27/2021   CREATININE 1.14 01/31/2022    Chronic back pain History of severe lumbar spinal stenosis, with hyperreflexia.  Followed by back specialist, Dr. BBrien Few  He was referred to his provider at his new practice.  Status post injections, treatment with gabapentin, also with underlying tremor.  Recommended follow-up with his specialist at his September visit, noted some possible intermittent fecal incontinence at that time and advised he discuss that with his specialist to determine if other testing indicated.  ER evaluation was recommended if any persistent fecal incontinence or worsening urinary incontinence.  He has discussed his urinary incontinence with urologist but declined procedure previously recommended. Has seen Dr. BBrien Fewsince our last visit. Repeat injection declined for now.  Feeling ok for now.  No further stool incontinence.  No change in urinary incontinence - followed by urology.  Gabapentin helps. Rare need for pain pill - every few months, muscle relaxer for cramps in the past - out of that med for awhile. No side effects with these meds, and not combining.  Controlled substance database (PDMP) reviewed. No concerns appreciated. Last filled hydrocodone #20 in 03/23/21.   Peripheral arterial disease Evaluated by vascular specialist in October.  Atherosclerosis of native arteries of bilateral lower extremities without symptoms at that time.  Recommended smoking cessation, A1c less than 7, BP less than 140/90, LDL less than 100 or less than 70 if symptomatic from PAD, aspirin  81 mg daily and atorvastatin 40 to 80 mg daily.  Yearly ABI for surveillance.  Occasional rash, itching rash - improves with rare need of triamcinolone. Requests refill, no new rash or skin lesions.    History Patient Active Problem List   Diagnosis Date Noted   Right carpal tunnel syndrome 06/08/2019   Malignant neoplasm of prostate ([place]) 10/27/2018   Allergic reaction  08/10/2018   Angioedema 08/10/2018   Peripheral arterial occlusive disease ([place]) 10/28/2016   Hyperlipidemia LDL goal <70 05/01/2015   Tobacco use disorder 12/09/2013   Coronary artery disease involving native coronary artery of native heart without angina pectoris 12/09/2013   Erectile dysfunction due to diseases classified elsewhere 12/09/2013   Type 2 diabetes mellitus with other circulatory complications ([place]) 24/82/5003   Left lumbar radiculopathy 09/30/2012   Chronic rhinitis 04/08/2011   Hypertension    HTN (hypertension), benign 03/09/2011   History of prostate cancer 03/09/2011   Past Medical History:  Diagnosis Date   Angio-edema    CAD (coronary artery disease)    Per [place] New Patient Packet    Carpal tunnel syndrome    Per [place] New Patient Packet    Diabetes mellitus without complication ([place])    Hypertension    Neuromuscular disorder ([place])    Peripheral arterial disease ([place]) 11/14/2016   non-occlusive, asymptomatic, seen by vascular surgery Dr. Oneida Alar who rec repeated ABIs annually   Prostate cancer [facility])    Past Surgical History:  Procedure Laterality Date   CATARACT EXTRACTION Right 08/23/2020   COLON SURGERY     "locked bowel" fixed   COLONOSCOPY  2019   HERNIA REPAIR     LEFT HEART CATHETERIZATION WITH CORONARY ANGIOGRAM N/A 09/15/2013   Procedure: LEFT HEART CATHETERIZATION WITH CORONARY ANGIOGRAM;  Surgeon: Peter M Martinique, MD;  Location[facility] C[facility] LAB;  Service: Cardiovascular;  Laterality: N/A;   PROSTATE SURGERY  2005   Allergies  Allergen Reactions   Lisinopril Swelling    angioedema   Prior to Admission medications   Medication Sig Start Date End Date Taking? Authorizing Provider  amLODipine (NORVASC) 10 MG tablet Take 1 tablet (10 mg total) by mouth daily. 09/27/21  Yes Wendie Agreste, MD  aspirin EC 81 MG tablet Take 81 mg by mouth daily as needed for mild pain.    Yes [provider]  atorvastatin (LIPITOR) 40 MG tablet TAKE 1 TABLET BY  MOUTH DAILY 06/06/21  Yes Wendie Agreste, MD  Cholecalciferol (VITAMIN D3) 50 MCG (2000 UT) TABS Take by mouth.   Yes [provider]  diphenhydrAMINE (BENADRYL) 25 MG tablet Take 25 mg by mouth every 6 (six) hours as needed.   Yes [provider]  fexofenadine (ALLEGRA) 180 MG tablet Take 1 tablet (180 mg total) by mouth daily. 08/10/18  Yes Bobbitt, Sedalia Muta, MD  fluticasone [facility]) 50 MCG/ACT nasal spray Place 2 sprays into both nostrils daily. 03/18/19  Yes Jacelyn Pi, Lilia Argue, MD  gabapentin (NEURONTIN) 300 MG capsule Take 3 capsules (900 mg total) by mouth 2 (two) times daily. 09/27/21  Yes Wendie Agreste, MD  glipiZIDE (GLUCOTROL) 5 MG tablet TAKE 1 TABLET BY MOUTH TWICE  DAILY BEFORE A MEAL 10/24/21  Yes Wendie Agreste, MD  glipiZIDE (GLUCOTROL) 5 MG tablet Take 0.5 tablets (2.5 mg total) by mouth 2 (two) times daily before a meal. 10/24/21  Yes Wendie Agreste, MD  HYDROcodone-acetaminophen (NORCO/VICODIN) 5-325 MG tablet Take 1 tablet by mouth every 6 (six) hours  as needed for moderate pain. 03/23/21  Yes Wendie Agreste, MD  metFORMIN (GLUCOPHAGE) 1000 MG tablet TAKE 1 TABLET BY MOUTH TWICE  DAILY WITH A MEAL 12/19/21  Yes Wendie Agreste, MD  sildenafil (VIAGRA) 100 MG tablet Take 1 tablet (100 mg total) by mouth as needed 1hr prior to sexual activities 12/21/21  Yes   Accu-Chek Softclix Lancets lancets Use once daily if symptoms of low blood sugar Patient not taking: Reported on 01/31/2022 10/19/20   Wendie Agreste, MD  blood glucose meter kit and supplies Dispense based on patient and insurance preference. Once per day if symptoms of low blood sugar. Meter of choice per insurance coverage. Patient not taking: Reported on 01/31/2022 10/19/20   Wendie Agreste, MD  Blood Glucose Monitoring Suppl (ACCU-CHEK GUIDE) w/Device KIT Use once daily if symptoms of low blood sugar Patient not taking: Reported on 01/31/2022 10/19/20   Wendie Agreste, MD   EPINEPHrine (EPIPEN 2-PAK) 0.3 mg/0.3 mL IJ SOAJ injection Inject 0.3 mLs (0.3 mg total) into the muscle as needed for anaphylaxis. Patient not taking: Reported on 01/31/2022 08/10/18   Bobbitt, Sedalia Muta, MD  glucose blood test strip Use once daily if symptoms of low blood sugar Patient not taking: Reported on 01/31/2022 10/19/20     Sod Fluoride-Potassium Nitrate (SODIUM FLUORIDE 5000 SENSITIVE) 1.1-5 % GEL Brush twice daily with toothpaste and spit out. Do not rinse, eat, or drink after using Patient not taking: Reported on 01/31/2022 02/01/21     tiZANidine (ZANAFLEX) 2 MG tablet Take 1 tablet (2 mg total) by mouth every 6 (six) hours as needed for muscle spasms. Patient not taking: Reported on 01/31/2022 06/14/21   Wendie Agreste, MD  triamcinolone cream (KENALOG) 0.1 % APPLY TOPICALLY TWICE DAILY 06/06/21   Wendie Agreste, MD   Social History   Socioeconomic History   Marital status: Widowed    Spouse name: Not on file   Number of children: 2   Years of education: 9   Highest education level: Not on file  Occupational History   Occupation: retired    Comment: Diplomatic Services operational officer  Tobacco Use   Smoking status: Every Day    Years: 55.00    Types: Cigarettes   Smokeless tobacco: Never  Vaping Use   Vaping Use: Never used  Substance and Sexual Activity   Alcohol use: No   Drug use: No   Sexual activity: Yes  Other Topics Concern   Not on file  Social History Narrative   Patient is single and his daughter lives with him.   Patient has two children.   Patient has a 9th grade education.   Patient works in Architect (part-time).   Patient drinks one to two cups of coffee daily.   Patient is right-handed.         Per [facility] New Patient Packet:   Diet:Left blank      Caffeine:Yes      Married, if yes what year: Widowed      Do you live in a house, apartment, assisted living, condo, trailer, ect: House, 3 persons      Is it one or more stories: One      Pets: No       Current/Past profession: Interior and spatial designer      Highest level or education completed:       Exercise:         No         Type and how often:  Living Will: No   DNR: No   POA/HPOA: No      Functional Status:   Do you have difficulty bathing or dressing yourself? No   Do you have difficulty preparing food or eating? No   Do you have difficulty managing your medications? No   Do you have difficulty managing your finances? No   Do you have difficulty affording your medications? No   Social Determinants of Health   Financial Resource Strain: Low Risk  (08/01/2021)   Overall Financial Resource Strain (CARDIA)    Difficulty of Paying Living Expenses: Not hard at all  Food Insecurity: No Food Insecurity (08/01/2021)   Hunger Vital Sign    Worried About Running Out of Food in the Last Year: Never true    Ran Out of Food in the Last Year: Never true  Transportation Needs: No Transportation Needs (08/01/2021)   PRAPARE - Hydrologist (Medical): No    Lack of Transportation (Non-Medical): No  Physical Activity: Sufficiently Active (08/01/2021)   Exercise Vital Sign    Days of Exercise per Week: 5 days    Minutes of Exercise per Session: 30 min  Stress: No Stress Concern Present (08/01/2021)   [place]    Feeling of Stress : Not at all  Social Connections: Moderately Isolated (08/01/2021)   Social Connection and Isolation Panel [NHANES]    Frequency of Communication with Friends and Family: Twice a week    Frequency of Social Gatherings with Friends and Family: Twice a week    Attends Religious Services: More than 4 times per year    Active Member of [employer] or Organizations: No    Attends Archivist Meetings: Never    Marital Status: Widowed  Intimate Partner Violence: Not At Risk (08/01/2021)   Humiliation, Afraid, Rape, and Kick questionnaire    Fear of Current or Ex-Partner: No     Emotionally Abused: No    Physically Abused: No    Sexually Abused: No    Review of Systems Per HPI  Objective:   Vitals:   01/31/22 0936 01/31/22 1041  BP: (!) 140/74 130/76  Pulse: 73   Temp: 97.9 F (36.6 C)   TempSrc: Oral   SpO2: 92%   Weight: 134 lb (60.8 kg)   Height: '5\' 4"'$  (1.626 m)      Physical Exam Vitals reviewed.  Constitutional:      Appearance: He is well-developed.  HENT:     Head: Normocephalic and atraumatic.  Neck:     Vascular: No carotid bruit or JVD.  Cardiovascular:     Rate and Rhythm: Normal rate and regular rhythm.     Heart sounds: Normal heart sounds. No murmur heard. Pulmonary:     Effort: Pulmonary effort is normal.     Breath sounds: Normal breath sounds. No rales.  Musculoskeletal:     Right lower leg: No edema.     Left lower leg: No edema.  Skin:    General: Skin is warm and dry.  Neurological:     Mental Status: He is alert and oriented to person, place, and time.  Psychiatric:        Mood and Affect: Mood normal.        Assessment & Plan:  Alexander [name] is a 77 y.o. male . Type 2 diabetes mellitus with other circulatory complications (HCC) - Plan: Hemoglobin A1c, Microalbumin / creatinine urine ratio  -  Tolerating current regimen, check labs and medication adjustments accordingly.  Recommended podiatry for nail care/footcare, declined for now.  Lumbar radiculopathy, chronic - Plan: HYDROcodone-acetaminophen (NORCO/VICODIN) 5-325 MG tablet, tiZANidine (ZANAFLEX) 2 MG tablet Muscle spasms of both lower extremities - Plan: tiZANidine (ZANAFLEX) 2 MG tablet Spinal stenosis, unspecified spinal region  -Agreed to temporary refill of hydrocodone and tizanidine with potential side effects discussed, avoid combination.  Infrequent use.  Continue follow-up with his spine specialist with RTC/ER precautions if acute changes.  Essential hypertension - Plan: Basic metabolic panel  -Stable, check labs.  Continue same  regimen.  Hyperlipidemia LDL goal <70 - Plan: Basic metabolic panel  -Tolerating current dose of Lipitor.  Continue same  Dermatitis - Plan: triamcinolone cream (KENALOG) 0.1 %  -Triamcinolone cream refilled for intermittent use of area if needed but RTC precautions given if persistent or new rash.  Meds ordered this encounter  Medications   HYDROcodone-acetaminophen (NORCO/VICODIN) 5-325 MG tablet    Sig: Take 1 tablet by mouth every 6 (six) hours as needed for moderate pain.    Dispense:  20 tablet    Refill:  0   tiZANidine (ZANAFLEX) 2 MG tablet    Sig: Take 1 tablet (2 mg total) by mouth every 6 (six) hours as needed for muscle spasms.    Dispense:  15 tablet    Refill:  0   atorvastatin (LIPITOR) 40 MG tablet    Sig: Take 1 tablet (40 mg total) by mouth daily.    Dispense:  90 tablet    Refill:  3    Requesting 1 year supply   triamcinolone cream (KENALOG) 0.1 %    Sig: APPLY TOPICALLY TWICE DAILY as needed,    Dispense:  45 g    Refill:  0   Patient Instructions  I temporary refilled your pain medication and muscle laxer, but do not combine this medicines and be careful using those medicines as they can cause side effects as we discussed.  If you do require those medications more frequently, those will need to be discussed with your back specialist.  No medication changes today otherwise.  I will check your diabetes test and electrolytes.  Recheck in 3 months for physical.  Let me know if you would like to see a podiatrist for nail care.  Steroid cream was refilled if needed intermittently but if you do have a rash or new skin area that does not improve within a few doses, please schedule visit for evaluation.  Take care!    Signed,   Merri Ray, MD [place] 01/31/22 5:21 PM

## 2022-01-31 NOTE — Patient Instructions (Signed)
I temporary refilled your pain medication and muscle laxer, but do not combine this medicines and be careful using those medicines as they can cause side effects as we discussed.  If you do require those medications more frequently, those will need to be discussed with your back specialist.  No medication changes today otherwise.  I will check your diabetes test and electrolytes.  Recheck in 3 months for physical.  Let me know if you would like to see a podiatrist for nail care.  Steroid cream was refilled if needed intermittently but if you do have a rash or new skin area that does not improve within a few doses, please schedule visit for evaluation.  Take care!

## 2022-02-07 ENCOUNTER — Other Ambulatory Visit: Payer: Self-pay | Admitting: Family Medicine

## 2022-02-07 DIAGNOSIS — M5416 Radiculopathy, lumbar region: Secondary | ICD-10-CM

## 2022-02-07 DIAGNOSIS — M62838 Other muscle spasm: Secondary | ICD-10-CM

## 2022-02-07 DIAGNOSIS — L309 Dermatitis, unspecified: Secondary | ICD-10-CM

## 2022-02-21 NOTE — Progress Notes (Signed)
This encounter was created in error - please disregard.

## 2022-02-24 ENCOUNTER — Other Ambulatory Visit: Payer: Self-pay | Admitting: Family Medicine

## 2022-02-24 DIAGNOSIS — M5416 Radiculopathy, lumbar region: Secondary | ICD-10-CM

## 2022-02-25 NOTE — Telephone Encounter (Signed)
Medication discussed at his January 11 visit.  Controlled substance database reviewed.  Hydrocodone No. 20 filled on 02/01/2022.  Gabapentin refill ordered.

## 2022-02-25 NOTE — Telephone Encounter (Signed)
Gabapentin 300 mg  LOV: 01/31/22 Last Refill:09/27/21 Upcoming appt: 05/02/22

## 2022-03-26 DIAGNOSIS — H43813 Vitreous degeneration, bilateral: Secondary | ICD-10-CM | POA: Diagnosis not present

## 2022-05-02 ENCOUNTER — Ambulatory Visit (INDEPENDENT_AMBULATORY_CARE_PROVIDER_SITE_OTHER): Payer: Medicare Other | Admitting: Family Medicine

## 2022-05-02 ENCOUNTER — Other Ambulatory Visit (HOSPITAL_COMMUNITY): Payer: Self-pay

## 2022-05-02 ENCOUNTER — Encounter: Payer: Self-pay | Admitting: Family Medicine

## 2022-05-02 VITALS — BP 128/70 | HR 63 | Temp 98.7°F | Resp 16 | Ht 64.0 in | Wt 143.0 lb

## 2022-05-02 DIAGNOSIS — Z0000 Encounter for general adult medical examination without abnormal findings: Secondary | ICD-10-CM

## 2022-05-02 DIAGNOSIS — M5416 Radiculopathy, lumbar region: Secondary | ICD-10-CM | POA: Diagnosis not present

## 2022-05-02 DIAGNOSIS — I10 Essential (primary) hypertension: Secondary | ICD-10-CM

## 2022-05-02 DIAGNOSIS — F17200 Nicotine dependence, unspecified, uncomplicated: Secondary | ICD-10-CM

## 2022-05-02 DIAGNOSIS — E1159 Type 2 diabetes mellitus with other circulatory complications: Secondary | ICD-10-CM

## 2022-05-02 DIAGNOSIS — E785 Hyperlipidemia, unspecified: Secondary | ICD-10-CM

## 2022-05-02 LAB — COMPREHENSIVE METABOLIC PANEL
ALT: 15 U/L (ref 0–53)
AST: 15 U/L (ref 0–37)
Albumin: 4.3 g/dL (ref 3.5–5.2)
Alkaline Phosphatase: 144 U/L — ABNORMAL HIGH (ref 39–117)
BUN: 14 mg/dL (ref 6–23)
CO2: 29 mEq/L (ref 19–32)
Calcium: 10.3 mg/dL (ref 8.4–10.5)
Chloride: 102 mEq/L (ref 96–112)
Creatinine, Ser: 1.08 mg/dL (ref 0.40–1.50)
GFR: 66.84 mL/min (ref >60.00)
Glucose, Bld: 160 mg/dL — ABNORMAL HIGH (ref 70–99)
Potassium: 4.5 mEq/L (ref 3.5–5.1)
Sodium: 138 mEq/L (ref 135–145)
Total Bilirubin: 0.5 mg/dL (ref 0.2–1.2)
Total Protein: 8 g/dL (ref 6.0–8.3)

## 2022-05-02 LAB — LIPID PANEL
Cholesterol: 95 mg/dL (ref 0–200)
HDL: 38.5 mg/dL — ABNORMAL LOW (ref >39.00)
LDL Cholesterol: 46 mg/dL (ref 0–99)
NonHDL: 56.38
Total CHOL/HDL Ratio: 2
Triglycerides: 51 mg/dL (ref 0.0–149.0)
VLDL: 10.2 mg/dL (ref 0.0–40.0)

## 2022-05-02 LAB — HEMOGLOBIN A1C: Hgb A1c MFr Bld: 8.5 % — ABNORMAL HIGH (ref 4.6–6.5)

## 2022-05-02 MED ORDER — GABAPENTIN 300 MG PO CAPS
900.0000 mg | ORAL_CAPSULE | Freq: Two times a day (BID) | ORAL | 1 refills | Status: DC
Start: 2022-05-02 — End: 2022-08-09
  Filled 2022-05-02: qty 600, 100d supply, fill #0

## 2022-05-02 NOTE — Patient Instructions (Addendum)
Depending on diabetes test results may need to follow up to discuss changes. I will let you know.  I do recommend quitting smoking, let me know if I can help.  You appear to be due for repeat colonoscopy. Call Dr. Haywood Pao office to schedule. [phone]    Preventive Care 65 Years and Older, Male Preventive care refers to lifestyle choices and visits with your health care provider that can promote health and wellness. Preventive care visits are also called wellness exams. What can I expect for my preventive care visit? Counseling During your preventive care visit, your health care provider may ask about your: Medical history, including: Past medical problems. Family medical history. History of falls. Current health, including: Emotional well-being. Home life and relationship well-being. Sexual activity. Memory and ability to understand (cognition). Lifestyle, including: Alcohol, nicotine or tobacco, and drug use. Access to firearms. Diet, exercise, and sleep habits. Work and work Astronomer. Sunscreen use. Safety issues such as seatbelt and bike helmet use. Physical exam Your health care provider will check your: Height and weight. These may be used to calculate your BMI (body mass index). BMI is a measurement that tells if you are at a healthy weight. Waist circumference. This measures the distance around your waistline. This measurement also tells if you are at a healthy weight and may help predict your risk of certain diseases, such as type 2 diabetes and high blood pressure. Heart rate and blood pressure. Body temperature. Skin for abnormal spots. What immunizations do I need?  Vaccines are usually given at various ages, according to a schedule. Your health care provider will recommend vaccines for you based on your age, medical history, and lifestyle or other factors, such as travel or where you work. What tests do I need? Screening Your health care provider may recommend  screening tests for certain conditions. This may include: Lipid and cholesterol levels. Diabetes screening. This is done by checking your blood sugar (glucose) after you have not eaten for a while (fasting). Hepatitis C test. Hepatitis B test. HIV (human immunodeficiency virus) test. STI (sexually transmitted infection) testing, if you are at risk. Lung cancer screening. Colorectal cancer screening. Prostate cancer screening. Abdominal aortic aneurysm (AAA) screening. You may need this if you are a current or former smoker. Talk with your health care provider about your test results, treatment options, and if necessary, the need for more tests. Follow these instructions at home: Eating and drinking  Eat a diet that includes fresh fruits and vegetables, whole grains, lean protein, and low-fat dairy products. Limit your intake of foods with high amounts of sugar, saturated fats, and salt. Take vitamin and mineral supplements as recommended by your health care provider. Do not drink alcohol if your health care provider tells you not to drink. If you drink alcohol: Limit how much you have to 0-2 drinks a day. Know how much alcohol is in your drink. In the [place] one drink equals one 12 oz bottle of beer (355 mL), one 5 oz glass of wine (148 mL), or one 1 oz glass of hard liquor (44 mL). Lifestyle Brush your teeth every morning and night with fluoride toothpaste. Floss one time each day. Exercise for at least 30 minutes 5 or more days each week. Do not use any products that contain nicotine or tobacco. These products include cigarettes, chewing tobacco, and vaping devices, such as e-cigarettes. If you need help quitting, ask your health care provider. Do not use drugs. If you are sexually active, practice  safe sex. Use a condom or other form of protection to prevent STIs. Take aspirin only as told by your health care provider. Make sure that you understand how much to take and what form to  take. Work with your health care provider to find out whether it is safe and beneficial for you to take aspirin daily. Ask your health care provider if you need to take a cholesterol-lowering medicine (statin). Find healthy ways to manage stress, such as: Meditation, yoga, or listening to music. Journaling. Talking to a trusted person. Spending time with friends and family. Safety Always wear your seat belt while driving or riding in a vehicle. Do not drive: If you have been drinking alcohol. Do not ride with someone who has been drinking. When you are tired or distracted. While texting. If you have been using any mind-altering substances or drugs. Wear a helmet and other protective equipment during sports activities. If you have firearms in your house, make sure you follow all gun safety procedures. Minimize exposure to UV radiation to reduce your risk of skin cancer. What's next? Visit your health care provider once a year for an annual wellness visit. Ask your health care provider how often you should have your eyes and teeth checked. Stay up to date on all vaccines. This information is not intended to replace advice given to you by your health care provider. Make sure you discuss any questions you have with your health care provider. Document Revised: 07/05/2020 Document Reviewed: 07/05/2020 Elsevier Patient Education  2023 [employer]  Steps to Quit Smoking Smoking tobacco is the leading cause of preventable death. It can affect almost every organ in the body. Smoking puts you and those around you at risk for developing many serious chronic diseases. Quitting smoking can be very challenging. Do not get discouraged if you are not successful the first time. Some people need to make many attempts to quit before they achieve long-term success. Do your best to stick to your quit plan, and talk with your health care provider if you have any questions or concerns. How do I get ready to  quit? When you decide to quit smoking, create a plan to help you succeed. Before you quit: Pick a date to quit. Set a date within the next 2 weeks to give you time to prepare. Write down the reasons why you are quitting. Keep this list in places where you will see it often. Tell your family, friends, and co-workers that you are quitting. Support from people you are close to can make quitting easier. Talk with your health care provider about your options for quitting smoking. Find out what treatment options are covered by your health insurance. Identify people, places, things, and activities that make you want to smoke (triggers). Avoid them. What first steps can I take to quit smoking? Throw away all cigarettes at home, at work, and in your car. Throw away smoking accessories, such as Set designer. Clean your car. Make sure to empty the ashtray. Clean your home, including curtains and carpets. What strategies can I use to quit smoking? Talk with your health care provider about combining strategies, such as taking medicines while you are also receiving in-person counseling. Using these two strategies together makes you more likely to succeed in quitting than if you used either strategy on its own. If you are pregnant or breastfeeding, talk with your health care provider about finding counseling or other support strategies to quit smoking. Do not take medicine  to help you quit smoking unless your health care provider tells you to. Quit right away Quit smoking completely, instead of gradually reducing how much you smoke over a period of time. Stopping smoking right away may be more successful than gradually quitting. Attend in-person counseling to help you build problem-solving skills. You are more likely to succeed in quitting if you attend counseling sessions regularly. Even short sessions of 10 minutes can be effective. Take medicine You may take medicines to help you quit smoking. Some  medicines require a prescription. You can also purchase over-the-counter medicines. Medicines may have nicotine in them to replace the nicotine in cigarettes. Medicines may: Help to stop cravings. Help to relieve withdrawal symptoms. Your health care provider may recommend: Nicotine patches, gum, or lozenges. Nicotine inhalers or sprays. Non-nicotine medicine that you take by mouth. Find resources Find resources and support systems that can help you quit smoking and remain smoke-free after you quit. These resources are most helpful when you use them often. They include: Online chats with a Veterinary surgeoncounselor. Telephone quitlines. Printed Materials engineerself-help materials. Support groups or group counseling. Text messaging programs. Mobile phone apps or applications. Use apps that can help you stick to your quit plan by providing reminders, tips, and encouragement. Examples of free services include Quit Guide from the CDC and smokefree.gov  What can I do to make it easier to quit?  Reach out to your family and friends for support and encouragement. Call telephone quitlines, such as 1-800-QUIT-NOW, reach out to support groups, or work with a counselor for support. Ask people who smoke to avoid smoking around you. Avoid places that trigger you to smoke, such as bars, parties, or smoke-break areas at work. Spend time with people who do not smoke. Lessen the stress in your life. Stress can be a smoking trigger for some people. To lessen stress, try: Exercising regularly. Doing deep-breathing exercises. Doing yoga. Meditating. What benefits will I see if I quit smoking? Over time, you should start to see positive results, such as: Improved sense of smell and taste. Decreased coughing and sore throat. Slower heart rate. Lower blood pressure. Clearer and healthier skin. The ability to breathe more easily. Fewer sick days. Summary Quitting smoking can be very challenging. Do not get discouraged if you are not  successful the first time. Some people need to make many attempts to quit before they achieve long-term success. When you decide to quit smoking, create a plan to help you succeed. Quit smoking right away, not slowly over a period of time. Find resources and support systems that can help you quit smoking and remain smoke-free after you quit. This information is not intended to replace advice given to you by your health care provider. Make sure you discuss any questions you have with your health care provider. Document Revised: 12/29/2020 Document Reviewed: 12/29/2020 Elsevier Patient Education  2023 [employer]

## 2022-05-02 NOTE — Progress Notes (Signed)
Subjective:  Patient ID: Alexander [name], male    DOB: [birth year]  Age: 77 y.o. MRN: [id number]  CC:  Chief Complaint  Patient presents with   Annual Exam    Pt states he is doing well     HPI Alexander Carelbert J Basinski presents for Annual Exam No specific concerns today.   Diabetes: Complicated by hyperglycemia, microalbuminuria, treated with metformin, glipizide with adjustments in dosages to minimize hypoglycemia.  A1c elevated in January.  Recommended home monitoring with updated readings to decide on medication changes.  I have not heard from him since last visit. Currently taking metformin 1000 mg twice daily, glipizide 5mg  - 1/2 in the morning, whole pill at night.  He is on statin. Home readings - none. But feeling fine - no symptoms of low blood sugar.  Microalbumin: Normal ratio in January Optho, foot exam, pneumovax: Pneumonia vaccine recommended - refused.   Lab Results  Component Value Date   HGBA1C 7.9 (H) 01/31/2022   HGBA1C 7.3 (H) 09/27/2021   HGBA1C 7.6 (H) 03/23/2021   Lab Results  Component Value Date   MICROALBUR 1.3 01/31/2022   LDLCALC 62 09/27/2021   CREATININE 1.14 01/31/2022   Chronic back pain With history of severe lumbar spinal stenosis, followed by back specialist Dr. Murray HodgkinsBartko, treated with injections, gabapentin with underlying tremor as well. Stable. No further incontinence.   Peripheral arterial disease Followed by vascular.  Atherosclerosis of bilateral lower extremities when discussed in October.  Smoking cessation recommended as well as goal LDL less than 100 or 70 if symptomatic from PAD, aspirin daily and atorvastatin continued.  Plan on yearly ABIs for surveillance. Recommended he schedule - he received letter to schedule recently.  Taking ASA, no new bleeding, no cold extremity or color changes. No new foot pains.   Tobacco abuse Jut under a pack per day - less than in past. Considering cessation - thinking about it.  Chantix made feel sick -  did not tolerate. Lung cancer screening - 08/2021 - planned repeat this year  Hypertension: Amlodipine 10mg  qd. No new side effects.  Home readings: BP Readings from Last 3 Encounters:  05/02/22 128/70  01/31/22 130/76  11/20/21 (!) 146/82   Lab Results  Component Value Date   CREATININE 1.14 01/31/2022   Hyperlipidemia: Lipitor 40 mg qd - no new side effects. LDL under 70 in September.  Lab Results  Component Value Date   CHOL 109 09/27/2021   HDL 36.30 (L) 09/27/2021   LDLCALC 62 09/27/2021   TRIG 55.0 09/27/2021   CHOLHDL 3 09/27/2021   Lab Results  Component Value Date   ALT 8 09/27/2021   AST 13 09/27/2021   ALKPHOS 112 09/27/2021   BILITOT 0.3 09/27/2021          05/02/2022   10:20 AM 01/31/2022    9:43 AM 09/27/2021   10:22 AM 08/01/2021    9:18 AM 08/01/2021    9:17 AM  Depression screen PHQ 2/9  Decreased Interest 0 0 0 0 0  Down, Depressed, Hopeless 0 0 0 0 0  PHQ - 2 Score 0 0 0 0 0  Altered sleeping 0  0    Tired, decreased energy 0  0    Change in appetite 0  0    Feeling bad or failure about yourself  0  0    Trouble concentrating 0  0    Moving slowly or fidgety/restless 0  0    Suicidal thoughts 0  0    PHQ-9 Score 0  0    Difficult doing work/chores Not difficult at all        Health Maintenance  Topic Date Due   Pneumonia Vaccine 28+ Years old (1 of 2 - PCV) Never done   Zoster Vaccines- Shingrix (1 of 2) Never done   COVID-19 Vaccine (4 - 2023-24 season) 09/21/2021   OPHTHALMOLOGY EXAM  07/21/2022   HEMOGLOBIN A1C  08/01/2022   Medicare Annual Wellness (AWV)  08/02/2022   INFLUENZA VACCINE  08/22/2022   Lung Cancer Screening  09/07/2022   Diabetic kidney evaluation - eGFR measurement  02/01/2023   Diabetic kidney evaluation - Urine ACR  02/01/2023   FOOT EXAM  05/02/2023   COLONOSCOPY (Pts 45-18yrs Insurance coverage will need to be confirmed)  05/31/2023   DTaP/Tdap/Td (2 - Tdap) 07/16/2024   Hepatitis C Screening  Completed   HPV  VACCINES  Aged Out  Colonoscopy: 2019 - repeat 3-5 yrs - Dr. Elnoria Howard.  Prostate: Followed by urology for prostate cancer, surveillance with restaging when PSA gets to 0.5.   Lab Results  Component Value Date   PSA1 1.4 05/06/2018   PSA1 0.6 03/10/2017    Immunization History  Administered Date(s) Administered   PFIZER(Purple Top)SARS-COV-2 Vaccination 03/03/2019, 03/24/2019, 10/26/2019   Td 07/17/2014  Declines shingrix, rsv vaccine, pneumonia vaccination, and covid booster.   No results found. Optho visit in past year, follow up planned in June for adjustment in glasses.   Dental: appt 05/16/22  Alcohol: none  Tobacco: as above - less than pack per day.   Exercise: active with work, Scientist, water quality. Still working at times.    History Patient Active Problem List   Diagnosis Date Noted   Right carpal tunnel syndrome 06/08/2019   Malignant neoplasm of prostate 10/27/2018   Allergic reaction 08/10/2018   Angioedema 08/10/2018   Peripheral arterial occlusive disease 10/28/2016   Hyperlipidemia LDL goal <70 05/01/2015   Tobacco use disorder 12/09/2013   Coronary artery disease involving native coronary artery of native heart without angina pectoris 12/09/2013   Erectile dysfunction due to diseases classified elsewhere 12/09/2013   Type 2 diabetes mellitus with other circulatory complications 12/09/2013   Left lumbar radiculopathy 09/30/2012   Chronic rhinitis 04/08/2011   Hypertension    HTN (hypertension), benign 03/09/2011   History of prostate cancer 03/09/2011   Past Medical History:  Diagnosis Date   Angio-edema    CAD (coronary artery disease)    Per [facility] New Patient Packet    Carpal tunnel syndrome    Per [facility] New Patient Packet    Diabetes mellitus without complication    Hypertension    Neuromuscular disorder    Peripheral arterial disease 11/14/2016   non-occlusive, asymptomatic, seen by vascular surgery Dr. Darrick Penna who rec repeated ABIs annually   Prostate cancer     Past Surgical History:  Procedure Laterality Date   CATARACT EXTRACTION Right 08/23/2020   COLON SURGERY     "locked bowel" fixed   COLONOSCOPY  2019   HERNIA REPAIR     LEFT HEART CATHETERIZATION WITH CORONARY ANGIOGRAM N/A 09/15/2013   Procedure: LEFT HEART CATHETERIZATION WITH CORONARY ANGIOGRAM;  Surgeon: Peter M Swaziland, MD;  Location: [facility] CATH LAB;  Service: Cardiovascular;  Laterality: N/A;   PROSTATE SURGERY  2005   Allergies  Allergen Reactions   Lisinopril Swelling    angioedema   Prior to Admission medications   Medication Sig Start Date End Date Taking? Authorizing Provider  amLODipine (NORVASC) 10 MG tablet Take 1 tablet (10 mg total) by mouth daily. 09/27/21  Yes Shade Flood, MD  aspirin EC 81 MG tablet Take 81 mg by mouth daily as needed for mild pain.    Yes [provider]  atorvastatin (LIPITOR) 40 MG tablet Take 1 tablet (40 mg total) by mouth daily. 01/31/22  Yes Shade Flood, MD  Cholecalciferol (VITAMIN D3) 50 MCG (2000 UT) TABS Take by mouth.   Yes [provider]  diphenhydrAMINE (BENADRYL) 25 MG tablet Take 25 mg by mouth every 6 (six) hours as needed.   Yes [provider]  fexofenadine (ALLEGRA) 180 MG tablet Take 1 tablet (180 mg total) by mouth daily. 08/10/18  Yes Bobbitt, Heywood Iles, MD  fluticasone [facility]) 50 MCG/ACT nasal spray Place 2 sprays into both nostrils daily. 03/18/19  Yes Lezlie Lye, Meda Coffee, MD  gabapentin (NEURONTIN) 300 MG capsule TAKE 3 CAPSULES BY MOUTH TWICE  DAILY 02/25/22  Yes Shade Flood, MD  glipiZIDE (GLUCOTROL) 5 MG tablet TAKE 1 TABLET BY MOUTH TWICE  DAILY BEFORE A MEAL 10/24/21  Yes Shade Flood, MD  glipiZIDE (GLUCOTROL) 5 MG tablet Take 0.5 tablets (2.5 mg total) by mouth 2 (two) times daily before a meal. 10/24/21  Yes Shade Flood, MD  HYDROcodone-acetaminophen (NORCO/VICODIN) 5-325 MG tablet Take 1 tablet by mouth every 6 (six) hours as needed for moderate pain. 01/31/22  Yes  Shade Flood, MD  metFORMIN (GLUCOPHAGE) 1000 MG tablet TAKE 1 TABLET BY MOUTH TWICE  DAILY WITH A MEAL 12/19/21  Yes Shade Flood, MD  sildenafil (VIAGRA) 100 MG tablet Take 1 tablet (100 mg total) by mouth as needed 1hr prior to sexual activities 12/21/21  Yes   tiZANidine (ZANAFLEX) 2 MG tablet TAKE 1 TABLET BY MOUTH EVERY 6  HOURS AS NEEDED FOR MUSCLE  SPASM(S) 02/07/22  Yes Shade Flood, MD  triamcinolone cream (KENALOG) 0.1 % APPLY TOPICALLY TWICE DAILY AS  NEEDED 02/07/22  Yes Shade Flood, MD   Social History   Socioeconomic History   Marital status: Widowed    Spouse name: Not on file   Number of children: 2   Years of education: 9   Highest education level: Not on file  Occupational History   Occupation: retired    Comment: Ecologist  Tobacco Use   Smoking status: Every Day    Years: 55    Types: Cigarettes   Smokeless tobacco: Never  Vaping Use   Vaping Use: Never used  Substance and Sexual Activity   Alcohol use: No   Drug use: No   Sexual activity: Yes  Other Topics Concern   Not on file  Social History Narrative   Patient is single and his daughter lives with him.   Patient has two children.   Patient has a 9th grade education.   Patient works in Holiday representative (part-time).   Patient drinks one to two cups of coffee daily.   Patient is right-handed.         Per [facility] New Patient Packet:   Diet:Left blank      Caffeine:Yes      Married, if yes what year: Widowed      Do you live in a house, apartment, assisted living, condo, trailer, ect: House, 3 persons      Is it one or more stories: One      Pets: No      Current/Past profession: Recruitment consultant  Highest level or education completed:       Exercise:         No         Type and how often:          Living Will: No   DNR: No   POA/HPOA: No      Functional Status:   Do you have difficulty bathing or dressing yourself? No   Do you have difficulty preparing food or eating? No    Do you have difficulty managing your medications? No   Do you have difficulty managing your finances? No   Do you have difficulty affording your medications? No   Social Determinants of Health   Financial Resource Strain: Low Risk  (08/01/2021)   Overall Financial Resource Strain (CARDIA)    Difficulty of Paying Living Expenses: Not hard at all  Food Insecurity: No Food Insecurity (08/01/2021)   Hunger Vital Sign    Worried About Running Out of Food in the Last Year: Never true    Ran Out of Food in the Last Year: Never true  Transportation Needs: No Transportation Needs (08/01/2021)   PRAPARE - Administrator, Civil Service (Medical): No    Lack of Transportation (Non-Medical): No  Physical Activity: Sufficiently Active (08/01/2021)   Exercise Vital Sign    Days of Exercise per Week: 5 days    Minutes of Exercise per Session: 30 min  Stress: No Stress Concern Present (08/01/2021)   [employer] of Occupational Health - Occupational Stress Questionnaire    Feeling of Stress : Not at all  Social Connections: Moderately Isolated (08/01/2021)   Social Connection and Isolation Panel [NHANES]    Frequency of Communication with Friends and Family: Twice a week    Frequency of Social Gatherings with Friends and Family: Twice a week    Attends Religious Services: More than 4 times per year    Active Member of [employer] or Organizations: No    Attends Banker Meetings: Never    Marital Status: Widowed  Intimate Partner Violence: Not At Risk (08/01/2021)   Humiliation, Afraid, Rape, and Kick questionnaire    Fear of Current or Ex-Partner: No    Emotionally Abused: No    Physically Abused: No    Sexually Abused: No    Review of Systems 13 point review of systems per patient health survey noted.  Negative other than as indicated above or in HPI.    Objective:   Vitals:   05/02/22 1016  BP: 128/70  Pulse: 63  Resp: 16  Temp: 98.7 F (37.1 C)  TempSrc:  Temporal  SpO2: 96%  Weight: 143 lb (64.9 kg)  Height: 5\' 4"  (1.626 m)     Physical Exam Vitals reviewed.  Constitutional:      Appearance: He is well-developed.  HENT:     Head: Normocephalic and atraumatic.     Right Ear: External ear normal.     Left Ear: External ear normal.  Eyes:     Conjunctiva/sclera: Conjunctivae normal.     Pupils: Pupils are equal, round, and reactive to light.  Neck:     Thyroid: No thyromegaly.  Cardiovascular:     Rate and Rhythm: Normal rate and regular rhythm.     Heart sounds: Normal heart sounds.  Pulmonary:     Effort: Pulmonary effort is normal. No respiratory distress.     Breath sounds: Normal breath sounds. No wheezing.  Abdominal:     General: There  is no distension.     Palpations: Abdomen is soft.     Tenderness: There is no abdominal tenderness.  Musculoskeletal:        General: No tenderness. Normal range of motion.     Cervical back: Normal range of motion and neck supple.  Lymphadenopathy:     Cervical: No cervical adenopathy.  Skin:    General: Skin is warm and dry.  Neurological:     Mental Status: He is alert and oriented to person, place, and time.     Deep Tendon Reflexes: Reflexes are normal and symmetric.  Psychiatric:        Behavior: Behavior normal.        Assessment & Plan:  JAIVEER PANAS is a 77 y.o. male . Annual physical exam  - -anticipatory guidance as below in AVS, screening labs above. Health maintenance items as above in HPI discussed/recommended as applicable.   Primary hypertension - Plan: Comprehensive metabolic panel  -Stable, continue follow-up as planned for PAD screening.  Continue same regimen.  Check labs with medication adjustments accordingly.  Type 2 diabetes mellitus with other circulatory complications - Plan: Hemoglobin A1c  -Check updated A1c, no med changes for now, denies hypoglycemia symptoms.  Hyperlipidemia LDL goal <70 - Plan: Lipid panel  -Last LDL stable at goal  under 70, check updated labs and planning adjustment accordingly.  Lumbar radiculopathy, chronic - Plan: gabapentin (NEURONTIN) 300 MG capsule  -Continue gabapentin and follow-up with specialist. Tobacco use  -Cessation discussed.  Continue planned follow-up for lung cancer screening.  Also recommended contacting gastroenterology for colonoscopy. Meds ordered this encounter  Medications   gabapentin (NEURONTIN) 300 MG capsule    Sig: Take 3 capsules (900 mg total) by mouth 2 (two) times daily.    Dispense:  600 capsule    Refill:  1    Please send a replace/new response with 100-Day Supply if appropriate to maximize member benefit. Requesting 1 year supply.   Patient Instructions  Depending on diabetes test results may need to follow up to discuss changes. I will let you know.  I do recommend quitting smoking, let me know if I can help.  You appear to be due for repeat colonoscopy. Call Dr. Haywood Pao office to schedule. [phone]    Preventive Care 65 Years and Older, Male Preventive care refers to lifestyle choices and visits with your health care provider that can promote health and wellness. Preventive care visits are also called wellness exams. What can I expect for my preventive care visit? Counseling During your preventive care visit, your health care provider may ask about your: Medical history, including: Past medical problems. Family medical history. History of falls. Current health, including: Emotional well-being. Home life and relationship well-being. Sexual activity. Memory and ability to understand (cognition). Lifestyle, including: Alcohol, nicotine or tobacco, and drug use. Access to firearms. Diet, exercise, and sleep habits. Work and work Astronomer. Sunscreen use. Safety issues such as seatbelt and bike helmet use. Physical exam Your health care provider will check your: Height and weight. These may be used to calculate your BMI (body mass index). BMI  is a measurement that tells if you are at a healthy weight. Waist circumference. This measures the distance around your waistline. This measurement also tells if you are at a healthy weight and may help predict your risk of certain diseases, such as type 2 diabetes and high blood pressure. Heart rate and blood pressure. Body temperature. Skin for abnormal spots. What immunizations do  I need?  Vaccines are usually given at various ages, according to a schedule. Your health care provider will recommend vaccines for you based on your age, medical history, and lifestyle or other factors, such as travel or where you work. What tests do I need? Screening Your health care provider may recommend screening tests for certain conditions. This may include: Lipid and cholesterol levels. Diabetes screening. This is done by checking your blood sugar (glucose) after you have not eaten for a while (fasting). Hepatitis C test. Hepatitis B test. HIV (human immunodeficiency virus) test. STI (sexually transmitted infection) testing, if you are at risk. Lung cancer screening. Colorectal cancer screening. Prostate cancer screening. Abdominal aortic aneurysm (AAA) screening. You may need this if you are a current or former smoker. Talk with your health care provider about your test results, treatment options, and if necessary, the need for more tests. Follow these instructions at home: Eating and drinking  Eat a diet that includes fresh fruits and vegetables, whole grains, lean protein, and low-fat dairy products. Limit your intake of foods with high amounts of sugar, saturated fats, and salt. Take vitamin and mineral supplements as recommended by your health care provider. Do not drink alcohol if your health care provider tells you not to drink. If you drink alcohol: Limit how much you have to 0-2 drinks a day. Know how much alcohol is in your drink. In the [place] one drink equals one 12 oz bottle of beer (355  mL), one 5 oz glass of wine (148 mL), or one 1 oz glass of hard liquor (44 mL). Lifestyle Brush your teeth every morning and night with fluoride toothpaste. Floss one time each day. Exercise for at least 30 minutes 5 or more days each week. Do not use any products that contain nicotine or tobacco. These products include cigarettes, chewing tobacco, and vaping devices, such as e-cigarettes. If you need help quitting, ask your health care provider. Do not use drugs. If you are sexually active, practice safe sex. Use a condom or other form of protection to prevent STIs. Take aspirin only as told by your health care provider. Make sure that you understand how much to take and what form to take. Work with your health care provider to find out whether it is safe and beneficial for you to take aspirin daily. Ask your health care provider if you need to take a cholesterol-lowering medicine (statin). Find healthy ways to manage stress, such as: Meditation, yoga, or listening to music. Journaling. Talking to a trusted person. Spending time with friends and family. Safety Always wear your seat belt while driving or riding in a vehicle. Do not drive: If you have been drinking alcohol. Do not ride with someone who has been drinking. When you are tired or distracted. While texting. If you have been using any mind-altering substances or drugs. Wear a helmet and other protective equipment during sports activities. If you have firearms in your house, make sure you follow all gun safety procedures. Minimize exposure to UV radiation to reduce your risk of skin cancer. What's next? Visit your health care provider once a year for an annual wellness visit. Ask your health care provider how often you should have your eyes and teeth checked. Stay up to date on all vaccines. This information is not intended to replace advice given to you by your health care provider. Make sure you discuss any questions you have  with your health care provider. Document Revised: 07/05/2020 Document Reviewed: 07/05/2020  Elsevier Patient Education  2023 Elsevier Inc.  Steps to Quit Smoking Smoking tobacco is the leading cause of preventable death. It can affect almost every organ in the body. Smoking puts you and those around you at risk for developing many serious chronic diseases. Quitting smoking can be very challenging. Do not get discouraged if you are not successful the first time. Some people need to make many attempts to quit before they achieve long-term success. Do your best to stick to your quit plan, and talk with your health care provider if you have any questions or concerns. How do I get ready to quit? When you decide to quit smoking, create a plan to help you succeed. Before you quit: Pick a date to quit. Set a date within the next 2 weeks to give you time to prepare. Write down the reasons why you are quitting. Keep this list in places where you will see it often. Tell your family, friends, and co-workers that you are quitting. Support from people you are close to can make quitting easier. Talk with your health care provider about your options for quitting smoking. Find out what treatment options are covered by your health insurance. Identify people, places, things, and activities that make you want to smoke (triggers). Avoid them. What first steps can I take to quit smoking? Throw away all cigarettes at home, at work, and in your car. Throw away smoking accessories, such as Set designer. Clean your car. Make sure to empty the ashtray. Clean your home, including curtains and carpets. What strategies can I use to quit smoking? Talk with your health care provider about combining strategies, such as taking medicines while you are also receiving in-person counseling. Using these two strategies together makes you more likely to succeed in quitting than if you used either strategy on its own. If you  are pregnant or breastfeeding, talk with your health care provider about finding counseling or other support strategies to quit smoking. Do not take medicine to help you quit smoking unless your health care provider tells you to. Quit right away Quit smoking completely, instead of gradually reducing how much you smoke over a period of time. Stopping smoking right away may be more successful than gradually quitting. Attend in-person counseling to help you build problem-solving skills. You are more likely to succeed in quitting if you attend counseling sessions regularly. Even short sessions of 10 minutes can be effective. Take medicine You may take medicines to help you quit smoking. Some medicines require a prescription. You can also purchase over-the-counter medicines. Medicines may have nicotine in them to replace the nicotine in cigarettes. Medicines may: Help to stop cravings. Help to relieve withdrawal symptoms. Your health care provider may recommend: Nicotine patches, gum, or lozenges. Nicotine inhalers or sprays. Non-nicotine medicine that you take by mouth. Find resources Find resources and support systems that can help you quit smoking and remain smoke-free after you quit. These resources are most helpful when you use them often. They include: Online chats with a Veterinary surgeon. Telephone quitlines. Printed Materials engineer. Support groups or group counseling. Text messaging programs. Mobile phone apps or applications. Use apps that can help you stick to your quit plan by providing reminders, tips, and encouragement. Examples of free services include Quit Guide from the CDC and smokefree.gov  What can I do to make it easier to quit?  Reach out to your family and friends for support and encouragement. Call telephone quitlines, such as 1-800-QUIT-NOW, reach out to  support groups, or work with a Veterinary surgeon for support. Ask people who smoke to avoid smoking around you. Avoid places that  trigger you to smoke, such as bars, parties, or smoke-break areas at work. Spend time with people who do not smoke. Lessen the stress in your life. Stress can be a smoking trigger for some people. To lessen stress, try: Exercising regularly. Doing deep-breathing exercises. Doing yoga. Meditating. What benefits will I see if I quit smoking? Over time, you should start to see positive results, such as: Improved sense of smell and taste. Decreased coughing and sore throat. Slower heart rate. Lower blood pressure. Clearer and healthier skin. The ability to breathe more easily. Fewer sick days. Summary Quitting smoking can be very challenging. Do not get discouraged if you are not successful the first time. Some people need to make many attempts to quit before they achieve long-term success. When you decide to quit smoking, create a plan to help you succeed. Quit smoking right away, not slowly over a period of time. Find resources and support systems that can help you quit smoking and remain smoke-free after you quit. This information is not intended to replace advice given to you by your health care provider. Make sure you discuss any questions you have with your health care provider. Document Revised: 12/29/2020 Document Reviewed: 12/29/2020 Elsevier Patient Education  2023 Elsevier Inc.      Signed,   Meredith Staggers, MD [facility], [facility] 05/02/22 10:37 AM

## 2022-05-05 ENCOUNTER — Encounter: Payer: Self-pay | Admitting: Family Medicine

## 2022-05-27 DIAGNOSIS — H524 Presbyopia: Secondary | ICD-10-CM | POA: Diagnosis not present

## 2022-05-27 DIAGNOSIS — E119 Type 2 diabetes mellitus without complications: Secondary | ICD-10-CM | POA: Diagnosis not present

## 2022-05-27 LAB — HM DIABETES EYE EXAM

## 2022-05-28 DIAGNOSIS — I10 Essential (primary) hypertension: Secondary | ICD-10-CM | POA: Diagnosis not present

## 2022-05-28 DIAGNOSIS — F17200 Nicotine dependence, unspecified, uncomplicated: Secondary | ICD-10-CM | POA: Diagnosis not present

## 2022-05-28 DIAGNOSIS — E1165 Type 2 diabetes mellitus with hyperglycemia: Secondary | ICD-10-CM | POA: Diagnosis not present

## 2022-06-05 ENCOUNTER — Telehealth: Payer: Self-pay | Admitting: Vascular Surgery

## 2022-06-05 NOTE — Telephone Encounter (Signed)
LVM to schedule recall. °

## 2022-06-06 ENCOUNTER — Other Ambulatory Visit: Payer: Self-pay

## 2022-06-06 DIAGNOSIS — I739 Peripheral vascular disease, unspecified: Secondary | ICD-10-CM

## 2022-06-10 NOTE — Telephone Encounter (Signed)
Appt has been scheduled.

## 2022-06-18 DIAGNOSIS — M6281 Muscle weakness (generalized): Secondary | ICD-10-CM | POA: Diagnosis not present

## 2022-06-18 DIAGNOSIS — M6289 Other specified disorders of muscle: Secondary | ICD-10-CM | POA: Diagnosis not present

## 2022-06-22 ENCOUNTER — Other Ambulatory Visit: Payer: Self-pay | Admitting: Acute Care

## 2022-06-22 DIAGNOSIS — Z87891 Personal history of nicotine dependence: Secondary | ICD-10-CM

## 2022-06-22 DIAGNOSIS — F17210 Nicotine dependence, cigarettes, uncomplicated: Secondary | ICD-10-CM

## 2022-06-22 DIAGNOSIS — Z122 Encounter for screening for malignant neoplasm of respiratory organs: Secondary | ICD-10-CM

## 2022-07-10 DIAGNOSIS — M62838 Other muscle spasm: Secondary | ICD-10-CM | POA: Diagnosis not present

## 2022-07-10 DIAGNOSIS — M6281 Muscle weakness (generalized): Secondary | ICD-10-CM | POA: Diagnosis not present

## 2022-07-13 ENCOUNTER — Ambulatory Visit (HOSPITAL_BASED_OUTPATIENT_CLINIC_OR_DEPARTMENT_OTHER)
Admission: RE | Admit: 2022-07-13 | Discharge: 2022-07-13 | Disposition: A | Discharge status: Home / Self Care | Payer: Medicare Other | Source: Ambulatory Visit | Attending: Vascular Surgery | Admitting: Vascular Surgery

## 2022-07-13 DIAGNOSIS — I739 Peripheral vascular disease, unspecified: Secondary | ICD-10-CM | POA: Insufficient documentation

## 2022-07-13 LAB — POCT I-STAT CREATININE: Creatinine, Ser: 1.3 mg/dL — ABNORMAL HIGH (ref 0.61–1.24)

## 2022-07-13 MED ORDER — IOHEXOL 350 MG/ML SOLN
125.0000 mL | Freq: Once | INTRAVENOUS | Status: AC | PRN
  Administered 2022-07-13: 125 mL via INTRAVENOUS

## 2022-07-16 ENCOUNTER — Other Ambulatory Visit: Payer: Self-pay

## 2022-07-16 ENCOUNTER — Other Ambulatory Visit: Payer: Self-pay | Admitting: Family Medicine

## 2022-07-16 ENCOUNTER — Telehealth: Payer: Self-pay | Admitting: Family Medicine

## 2022-07-16 DIAGNOSIS — I10 Essential (primary) hypertension: Secondary | ICD-10-CM

## 2022-07-16 DIAGNOSIS — J310 Chronic rhinitis: Secondary | ICD-10-CM

## 2022-07-16 MED ORDER — AMLODIPINE BESYLATE 10 MG PO TABS
10.0000 mg | ORAL_TABLET | Freq: Every day | ORAL | Status: DC
Start: 2022-07-16 — End: 2022-09-24

## 2022-07-16 NOTE — Telephone Encounter (Signed)
Patients daughter called concerning the patients medications. Wanting more information about refills for MONTELUKAST. Please advise

## 2022-07-16 NOTE — Telephone Encounter (Signed)
Left vm to call office

## 2022-07-17 ENCOUNTER — Other Ambulatory Visit: Payer: Self-pay

## 2022-07-17 DIAGNOSIS — J310 Chronic rhinitis: Secondary | ICD-10-CM

## 2022-07-17 MED ORDER — MONTELUKAST SODIUM 10 MG PO TABS
10.0000 mg | ORAL_TABLET | Freq: Every day | ORAL | 1 refills | Status: DC
Start: 2022-07-17 — End: 2023-01-02

## 2022-07-17 NOTE — Telephone Encounter (Signed)
Patient needed refill sent to [facility], no other concerns, refill sent to [facility] per the patients sister

## 2022-07-18 ENCOUNTER — Other Ambulatory Visit: Payer: Self-pay | Admitting: Family Medicine

## 2022-07-18 DIAGNOSIS — M5416 Radiculopathy, lumbar region: Secondary | ICD-10-CM

## 2022-07-18 NOTE — Telephone Encounter (Signed)
Hydrocodone 5-325  LOV: 05/02/22 Last Refill:01/31/22 Upcoming appt: 08/01/22

## 2022-07-19 ENCOUNTER — Telehealth: Payer: Self-pay

## 2022-07-19 MED ORDER — HYDROCODONE-ACETAMINOPHEN 5-325 MG PO TABS
1.0000 | ORAL_TABLET | Freq: Four times a day (QID) | ORAL | 0 refills | Status: DC | PRN
Start: 2022-07-19 — End: 2023-04-28

## 2022-07-19 NOTE — Telephone Encounter (Signed)
Pt called requesting a return call.  Reviewed pt's chart, returned call for clarification, no answer, lf vm.  Pt returned call, lf vm stating he had a problem with his L leg yesterday with a pain shooting from his knee down.  Reviewed pt's chart, returned call for clarification, two identifiers used. Pt stated that he was outside yesterday and an electric shock caused severe pain and repeated every few minutes. He stated this continued until approx 0100 this morning and he has had no symptoms today. He denies any other symptoms. He endorses having sciatic nerve pain d/t back problems and takes 900 mg of Gabapentin BID. Instructed him to monitor and tell Dr. Lenell Antu at his upcoming appt on Tuesday, 7/2. Confirmed understanding.

## 2022-07-19 NOTE — Telephone Encounter (Signed)
Chronic back pain discussed in April.  Infrequent use of hydrocodone,.  Controlled substance database reviewed, last filled in January.  Medication discussed in January as well.  Refill granted.

## 2022-07-22 NOTE — Progress Notes (Unsigned)
[facility]  ASSESSMENT / PLAN: Alexander [name] is a 77 y.o. male with atherosclerosis of native arteries of bilateral lower extremities causing no symptoms.  Patient counseled patients with asymptomatic peripheral arterial disease or claudication have a 1-2% risk of developing chronic limb threatening ischemia, but a 15-30% risk of mortality in the next 5 years. Intervention should only be considered for medically optimized patients with disabling symptoms.   Recommend the following which can slow the progression of atherosclerosis and reduce the risk of major adverse cardiac / limb events:  Complete cessation from all tobacco products. Blood glucose control with goal A1c < 7%. Blood pressure control with goal blood pressure < 140/90 mmHg. Lipid reduction therapy with goal LDL-C <100 mg/dL (<16 if symptomatic from PAD).  Aspirin 81mg  PO QD.  Atorvastatin 40-80mg  PO QD (or other "high intensity" statin therapy).  Follow-up yearly with ankle-brachial index for surveillance.  CHIEF COMPLAINT: Atherosclerosis identified on CT scan.  HISTORY OF PRESENT ILLNESS: Alexander [name] is a 77 y.o. male for to clinic for evaluation of atherosclerosis in the lower extremities incidentally discovered on recent CT scan.  The patient is asymptomatic from a peripheral arterial disease standpoint.  He is active and continues to work.  He reports he is unrestricted in his ability to walk.  He does not describe claudication type symptoms.  He has no symptoms of ischemic rest pain.  He has no ulcers about his feet.  Past Medical History:  Diagnosis Date   Angio-edema    CAD (coronary artery disease)    Per [facility] New Patient Packet    Carpal tunnel syndrome    Per [facility] New Patient Packet    Diabetes mellitus without complication (HCC)    Hypertension    Neuromuscular disorder (HCC)    Peripheral arterial disease (HCC) 11/14/2016   non-occlusive, asymptomatic, seen by vascular  surgery Dr. Darrick Penna who rec repeated ABIs annually   Prostate cancer [facility])     Past Surgical History:  Procedure Laterality Date   CATARACT EXTRACTION Right 08/23/2020   COLON SURGERY     "locked bowel" fixed   COLONOSCOPY  2019   HERNIA REPAIR     LEFT HEART CATHETERIZATION WITH CORONARY ANGIOGRAM N/A 09/15/2013   Procedure: LEFT HEART CATHETERIZATION WITH CORONARY ANGIOGRAM;  Surgeon: Peter M Swaziland, MD;  Location: [facility] CATH LAB;  Service: Cardiovascular;  Laterality: N/A;   PROSTATE SURGERY  2005    Family History  Problem Relation Age of Onset   Diabetes Mother    Depression Father    Hypertension Father    Diabetes Father    Thyroid disease Father    Hypertension Sister    Diabetes Sister    Congestive Heart Failure Sister    Kidney failure Sister    Diabetes Sister    Lung cancer Brother    Drug abuse Brother    Breast cancer Neg Hx    Prostate cancer Neg Hx    Colon cancer Neg Hx     Social History   Socioeconomic History   Marital status: Widowed    Spouse name: Not on file   Number of children: 2   Years of education: 9   Highest education level: Not on file  Occupational History   Occupation: retired    Comment: Ecologist  Tobacco Use   Smoking status: Every Day    Years: 55    Types: Cigarettes   Smokeless tobacco: Never  Advertising account planner  Vaping Use: Never used  Substance and Sexual Activity   Alcohol use: No   Drug use: No   Sexual activity: Yes  Other Topics Concern   Not on file  Social History Narrative   Patient is single and his daughter lives with him.   Patient has two children.   Patient has a 9th grade education.   Patient works in [employer] (part-time).   Patient drinks one to two cups of coffee daily.   Patient is right-handed.         Per [facility] New Patient Packet:   Diet:Left blank      Caffeine:Yes      Married, if yes what year: Widowed      Do you live in a house, apartment, assisted living, condo, trailer, ect: House,  3 persons      Is it one or more stories: One      Pets: No      Current/Past profession: Recruitment consultant      Highest level or education completed:       Exercise:         No         Type and how often:          Living Will: No   DNR: No   POA/HPOA: No      Functional Status:   Do you have difficulty bathing or dressing yourself? No   Do you have difficulty preparing food or eating? No   Do you have difficulty managing your medications? No   Do you have difficulty managing your finances? No   Do you have difficulty affording your medications? No   Social Determinants of Health   Financial Resource Strain: Low Risk  (08/01/2021)   Overall Financial Resource Strain (CARDIA)    Difficulty of Paying Living Expenses: Not hard at all  Food Insecurity: No Food Insecurity (08/01/2021)   Hunger Vital Sign    Worried About Running Out of Food in the Last Year: Never true    Ran Out of Food in the Last Year: Never true  Transportation Needs: No Transportation Needs (08/01/2021)   PRAPARE - Administrator, Civil Service (Medical): No    Lack of Transportation (Non-Medical): No  Physical Activity: Sufficiently Active (08/01/2021)   Exercise Vital Sign    Days of Exercise per Week: 5 days    Minutes of Exercise per Session: 30 min  Stress: No Stress Concern Present (08/01/2021)   [employer] of Occupational Health - Occupational Stress Questionnaire    Feeling of Stress : Not at all  Social Connections: Moderately Isolated (08/01/2021)   Social Connection and Isolation Panel [NHANES]    Frequency of Communication with Friends and Family: Twice a week    Frequency of Social Gatherings with Friends and Family: Twice a week    Attends Religious Services: More than 4 times per year    Active Member of [employer] or Organizations: No    Attends Banker Meetings: Never    Marital Status: Widowed  Intimate Partner Violence: Not At Risk (08/01/2021)   Humiliation, Afraid,  Rape, and Kick questionnaire    Fear of Current or Ex-Partner: No    Emotionally Abused: No    Physically Abused: No    Sexually Abused: No    Allergies  Allergen Reactions   Lisinopril Swelling    angioedema    Current Outpatient Medications  Medication Sig Dispense Refill   amLODipine (NORVASC) 10 MG tablet  Take 1 tablet (10 mg total) by mouth daily. 90 tablet 01   aspirin EC 81 MG tablet Take 81 mg by mouth daily as needed for mild pain.      atorvastatin (LIPITOR) 40 MG tablet Take 1 tablet (40 mg total) by mouth daily. 90 tablet 3   Cholecalciferol (VITAMIN D3) 50 MCG (2000 UT) TABS Take by mouth.     diphenhydrAMINE (BENADRYL) 25 MG tablet Take 25 mg by mouth every 6 (six) hours as needed.     fexofenadine (ALLEGRA) 180 MG tablet Take 1 tablet (180 mg total) by mouth daily. 30 tablet 5   fluticasone (FLONASE) 50 MCG/ACT nasal spray Place 2 sprays into both nostrils daily. 16 g 6   gabapentin (NEURONTIN) 300 MG capsule Take 3 capsules (900 mg total) by mouth 2 (two) times daily. 600 capsule 1   glipiZIDE (GLUCOTROL) 5 MG tablet TAKE 1 TABLET BY MOUTH TWICE  DAILY BEFORE A MEAL 200 tablet 2   glipiZIDE (GLUCOTROL) 5 MG tablet Take 0.5 tablets (2.5 mg total) by mouth 2 (two) times daily before a meal. 90 tablet 3   HYDROcodone-acetaminophen (NORCO/VICODIN) 5-325 MG tablet Take 1 tablet by mouth every 6 (six) hours as needed for moderate pain. 20 tablet 0   metFORMIN (GLUCOPHAGE) 1000 MG tablet TAKE 1 TABLET BY MOUTH TWICE  DAILY WITH A MEAL 200 tablet 2   montelukast (SINGULAIR) 10 MG tablet Take 1 tablet (10 mg total) by mouth daily. 90 tablet 1   sildenafil (VIAGRA) 100 MG tablet Take 1 tablet (100 mg total) by mouth as needed 1hr prior to sexual activities 30 tablet 3   tiZANidine (ZANAFLEX) 2 MG tablet TAKE 1 TABLET BY MOUTH EVERY 6  HOURS AS NEEDED FOR MUSCLE  SPASM(S) 15 tablet 0   triamcinolone cream (KENALOG) 0.1 % APPLY TOPICALLY TWICE DAILY AS  NEEDED 45 g 0   No current  facility-administered medications for this visit.    PHYSICAL EXAM There were no vitals filed for this visit.   Elderly gentleman in no acute distress Regular rate and rhythm Unlabored breathing No palpable pedal pulses Feet are warm   PERTINENT LABORATORY AND RADIOLOGIC DATA  Most recent CBC    Latest Ref Rng & Units 09/27/2021   11:19 AM 05/10/2020   10:43 AM 08/10/2018   10:07 AM  CBC  WBC 4.0 - 10.5 K/uL 6.1  5.8  8.6   Hemoglobin 13.0 - 17.0 g/dL 60.4  54.0  98.1   Hematocrit 39.0 - 52.0 % 40.1  42.3  41.2   Platelets 150.0 - 400.0 K/uL 245.0  265  230      Most recent CMP    Latest Ref Rng & Units 07/13/2022    9:10 AM 05/02/2022   11:04 AM 01/31/2022   10:44 AM  CMP  Glucose 70 - 99 mg/dL  191  478   BUN 6 - 23 mg/dL  14  11   Creatinine 2.95 - 1.24 mg/dL 6.21  3.08  6.57   Sodium 135 - 145 mEq/L  138  138   Potassium 3.5 - 5.1 mEq/L  4.5  4.1   Chloride 96 - 112 mEq/L  102  103   CO2 19 - 32 mEq/L  29  29   Calcium 8.4 - 10.5 mg/dL  84.6  9.7   Total Protein 6.0 - 8.3 g/dL  8.0    Total Bilirubin 0.2 - 1.2 mg/dL  0.5    Alkaline Phos 39 -  117 U/L  144    AST 0 - 37 U/L  15    ALT 0 - 53 U/L  15      Renal function CrCl cannot be calculated (Unknown ideal weight.).  Hgb A1c MFr Bld (%)  Date Value  05/02/2022 8.5 (H)    LDL Cholesterol (Calc)  Date Value Ref Range Status  05/10/2020 44 mg/dL (calc) Final    Comment:    Reference range: <100 . Desirable range <100 mg/dL for primary prevention;   <70 mg/dL for patients with CHD or diabetic patients  with > or = 2 CHD risk factors. Marland Kitchen LDL-C is now calculated using the Martin-Hopkins  calculation, which is a validated novel method providing  better accuracy than the Friedewald equation in the  estimation of LDL-C.  Horald Pollen et al. Lenox Ahr. 6295;284(13): 2061-2068  (http://education.[facility].com/faq/FAQ164)    LDL Cholesterol  Date Value Ref Range Status  05/02/2022 46 0 - 99 mg/dL Final     +-------+-----------+-----------+------------+------------+  ABI/TBIToday's ABIToday's TBIPrevious ABIPrevious TBI  +-------+-----------+-----------+------------+------------+  Right  0.49       0.21       0.68        0.40          +-------+-----------+-----------+------------+------------+  Left   0.65       0.40       0.68        0.39          +-------+-----------+-----------+------------+------------+  Rande Brunt. Lenell Antu, MD FACS [facility] Phone Number: [phone] 07/22/2022 9:17 AM   Total time spent on preparing this encounter including chart review, data review, collecting history, examining the patient, coordinating care for this new patient, 60 minutes.  Portions of this report may have been transcribed using voice recognition software.  Every effort has been made to ensure accuracy; however, inadvertent computerized transcription errors may still be present.

## 2022-07-23 ENCOUNTER — Ambulatory Visit (HOSPITAL_COMMUNITY)
Admission: RE | Admit: 2022-07-23 | Discharge: 2022-07-23 | Disposition: A | Discharge status: Home / Self Care | Payer: Medicare Other | Source: Ambulatory Visit | Attending: Vascular Surgery | Admitting: Vascular Surgery

## 2022-07-23 ENCOUNTER — Ambulatory Visit: Payer: Medicare Other | Admitting: Vascular Surgery

## 2022-07-23 ENCOUNTER — Encounter: Payer: Self-pay | Admitting: Vascular Surgery

## 2022-07-23 VITALS — BP 128/76 | HR 66 | Temp 98.0°F | Resp 20 | Ht 64.0 in | Wt 131.0 lb

## 2022-07-23 DIAGNOSIS — I739 Peripheral vascular disease, unspecified: Secondary | ICD-10-CM

## 2022-07-23 DIAGNOSIS — I779 Disorder of arteries and arterioles, unspecified: Secondary | ICD-10-CM | POA: Diagnosis not present

## 2022-07-23 LAB — VAS US ABI WITH/WO TBI
Left ABI: 0.74
Right ABI: 0.57

## 2022-07-31 DIAGNOSIS — G894 Chronic pain syndrome: Secondary | ICD-10-CM | POA: Diagnosis not present

## 2022-07-31 DIAGNOSIS — M48062 Spinal stenosis, lumbar region with neurogenic claudication: Secondary | ICD-10-CM | POA: Diagnosis not present

## 2022-08-01 ENCOUNTER — Ambulatory Visit: Payer: Medicare Other | Admitting: Family Medicine

## 2022-08-02 DIAGNOSIS — M6281 Muscle weakness (generalized): Secondary | ICD-10-CM | POA: Diagnosis not present

## 2022-08-02 DIAGNOSIS — M62838 Other muscle spasm: Secondary | ICD-10-CM | POA: Diagnosis not present

## 2022-08-05 ENCOUNTER — Other Ambulatory Visit: Payer: Self-pay

## 2022-08-05 DIAGNOSIS — I779 Disorder of arteries and arterioles, unspecified: Secondary | ICD-10-CM

## 2022-08-08 ENCOUNTER — Ambulatory Visit (INDEPENDENT_AMBULATORY_CARE_PROVIDER_SITE_OTHER): Payer: Medicare Other

## 2022-08-08 ENCOUNTER — Other Ambulatory Visit: Payer: Self-pay | Admitting: Family Medicine

## 2022-08-08 VITALS — Ht 65.0 in | Wt 129.0 lb

## 2022-08-08 DIAGNOSIS — Z0000 Encounter for general adult medical examination without abnormal findings: Secondary | ICD-10-CM

## 2022-08-08 DIAGNOSIS — M5416 Radiculopathy, lumbar region: Secondary | ICD-10-CM

## 2022-08-08 NOTE — Patient Instructions (Signed)
Alexander [name] , Thank you for taking time to come for your Medicare Wellness Visit. I appreciate your ongoing commitment to your health goals. Please review the following plan we discussed and let me know if I can assist you in the future.   These are the goals we discussed:  Goals       Quit smoking / using tobacco (pt-stated)      Patient states that he would like to cut back on smoking  I have cut back some, now smoking 1/2 a pack daily.        This is a list of the screening recommended for you and due dates:  Health Maintenance  Topic Date Due   Pneumonia Vaccine (1 of 2 - PCV) Never done   Zoster (Shingles) Vaccine (1 of 2) Never done   COVID-19 Vaccine (4 - 2023-24 season) 09/21/2021   Screening for Lung Cancer  09/07/2022   Flu Shot  08/22/2022   Hemoglobin A1C  11/01/2022   Yearly kidney health urinalysis for diabetes  02/01/2023   Yearly kidney function blood test for diabetes  05/02/2023   Complete foot exam   05/02/2023   Eye exam for diabetics  05/27/2023   Colon Cancer Screening  05/31/2023   Medicare Annual Wellness Visit  08/08/2023   DTaP/Tdap/Td vaccine (2 - Tdap) 07/16/2024   Hepatitis C Screening  Completed   HPV Vaccine  Aged Out    Advanced directives: Please bring a copy of your health care power of attorney and living will to the office to be added to your chart at your convenience.   Conditions/risks identified: Thank you for attending this appointment with me today.  Please discuss with your PCP at your up coming office visit if you should continue with colonoscopy checks being that you aged out this year.  Keep up the good work and remember to stay well hydrated during your work days.  Next appointment: Follow up in one year for your annual wellness visit.   Preventive Care 50 Years and Older, Male  Preventive care refers to lifestyle choices and visits with your health care provider that can promote health and wellness. What does preventive care  include? A yearly physical exam. This is also called an annual well check. Dental exams once or twice a year. Routine eye exams. Ask your health care provider how often you should have your eyes checked. Personal lifestyle choices, including: Daily care of your teeth and gums. Regular physical activity. Eating a healthy diet. Avoiding tobacco and drug use. Limiting alcohol use. Practicing safe sex. Taking low doses of aspirin every day. Taking vitamin and mineral supplements as recommended by your health care provider. What happens during an annual well check? The services and screenings done by your health care provider during your annual well check will depend on your age, overall health, lifestyle risk factors, and family history of disease. Counseling  Your health care provider may ask you questions about your: Alcohol use. Tobacco use. Drug use. Emotional well-being. Home and relationship well-being. Sexual activity. Eating habits. History of falls. Memory and ability to understand (cognition). Work and work Astronomer. Screening  You may have the following tests or measurements: Height, weight, and BMI. Blood pressure. Lipid and cholesterol levels. These may be checked every 5 years, or more frequently if you are over 27 years old. Skin check. Lung cancer screening. You may have this screening every year starting at age 75 if you have a 30-pack-year history of smoking  and currently smoke or have quit within the past 15 years. Fecal occult blood test (FOBT) of the stool. You may have this test every year starting at age 28. Flexible sigmoidoscopy or colonoscopy. You may have a sigmoidoscopy every 5 years or a colonoscopy every 10 years starting at age 46. Prostate cancer screening. Recommendations will vary depending on your family history and other risks. Hepatitis C blood test. Hepatitis B blood test. Sexually transmitted disease (STD) testing. Diabetes screening. This  is done by checking your blood sugar (glucose) after you have not eaten for a while (fasting). You may have this done every 1-3 years. Abdominal aortic aneurysm (AAA) screening. You may need this if you are a current or former smoker. Osteoporosis. You may be screened starting at age 91 if you are at high risk. Talk with your health care provider about your test results, treatment options, and if necessary, the need for more tests. Vaccines  Your health care provider may recommend certain vaccines, such as: Influenza vaccine. This is recommended every year. Tetanus, diphtheria, and acellular pertussis (Tdap, Td) vaccine. You may need a Td booster every 10 years. Zoster vaccine. You may need this after age 66. Pneumococcal 13-valent conjugate (PCV13) vaccine. One dose is recommended after age 46. Pneumococcal polysaccharide (PPSV23) vaccine. One dose is recommended after age 17. Talk to your health care provider about which screenings and vaccines you need and how often you need them. This information is not intended to replace advice given to you by your health care provider. Make sure you discuss any questions you have with your health care provider. Document Released: 02/03/2015 Document Revised: 09/27/2015 Document Reviewed: 11/08/2014 Elsevier Interactive Patient Education  2017 [employer].  Fall Prevention in the Home Falls can cause injuries. They can happen to people of all ages. There are many things you can do to make your home safe and to help prevent falls. What can I do on the outside of my home? Regularly fix the edges of walkways and driveways and fix any cracks. Remove anything that might make you trip as you walk through a door, such as a raised step or threshold. Trim any bushes or trees on the path to your home. Use bright outdoor lighting. Clear any walking paths of anything that might make someone trip, such as rocks or tools. Regularly check to see if handrails are  loose or broken. Make sure that both sides of any steps have handrails. Any raised decks and porches should have guardrails on the edges. Have any leaves, snow, or ice cleared regularly. Use sand or salt on walking paths during winter. Clean up any spills in your garage right away. This includes oil or grease spills. What can I do in the bathroom? Use night lights. Install grab bars by the toilet and in the tub and shower. Do not use towel bars as grab bars. Use non-skid mats or decals in the tub or shower. If you need to sit down in the shower, use a plastic, non-slip stool. Keep the floor dry. Clean up any water that spills on the floor as soon as it happens. Remove soap buildup in the tub or shower regularly. Attach bath mats securely with double-sided non-slip rug tape. Do not have throw rugs and other things on the floor that can make you trip. What can I do in the bedroom? Use night lights. Make sure that you have a light by your bed that is easy to reach. Do not use any  sheets or blankets that are too big for your bed. They should not hang down onto the floor. Have a firm chair that has side arms. You can use this for support while you get dressed. Do not have throw rugs and other things on the floor that can make you trip. What can I do in the kitchen? Clean up any spills right away. Avoid walking on wet floors. Keep items that you use a lot in easy-to-reach places. If you need to reach something above you, use a strong step stool that has a grab bar. Keep electrical cords out of the way. Do not use floor polish or wax that makes floors slippery. If you must use wax, use non-skid floor wax. Do not have throw rugs and other things on the floor that can make you trip. What can I do with my stairs? Do not leave any items on the stairs. Make sure that there are handrails on both sides of the stairs and use them. Fix handrails that are broken or loose. Make sure that handrails are as  long as the stairways. Check any carpeting to make sure that it is firmly attached to the stairs. Fix any carpet that is loose or worn. Avoid having throw rugs at the top or bottom of the stairs. If you do have throw rugs, attach them to the floor with carpet tape. Make sure that you have a light switch at the top of the stairs and the bottom of the stairs. If you do not have them, ask someone to add them for you. What else can I do to help prevent falls? Wear shoes that: Do not have high heels. Have rubber bottoms. Are comfortable and fit you well. Are closed at the toe. Do not wear sandals. If you use a stepladder: Make sure that it is fully opened. Do not climb a closed stepladder. Make sure that both sides of the stepladder are locked into place. Ask someone to hold it for you, if possible. Clearly mark and make sure that you can see: Any grab bars or handrails. First and last steps. Where the edge of each step is. Use tools that help you move around (mobility aids) if they are needed. These include: Canes. Walkers. Scooters. Crutches. Turn on the lights when you go into a dark area. Replace any light bulbs as soon as they burn out. Set up your furniture so you have a clear path. Avoid moving your furniture around. If any of your floors are uneven, fix them. If there are any pets around you, be aware of where they are. Review your medicines with your doctor. Some medicines can make you feel dizzy. This can increase your chance of falling. Ask your doctor what other things that you can do to help prevent falls. This information is not intended to replace advice given to you by your health care provider. Make sure you discuss any questions you have with your health care provider. Document Released: 11/03/2008 Document Revised: 06/15/2015 Document Reviewed: 02/11/2014 Elsevier Interactive Patient Education  2017 [employer] Managing Pain Without Opioids Opioids are strong  medicines used to treat moderate to severe pain. For some people, especially those who have long-term (chronic) pain, opioids may not be the best choice for pain management due to: Side effects like nausea, constipation, and sleepiness. The risk of addiction (opioid use disorder). The longer you take opioids, the greater your risk of addiction. Pain that lasts for more than 3 months is called chronic  pain. Managing chronic pain usually requires more than one approach and is often provided by a team of health care providers working together (multidisciplinary approach). Pain management may be done at a pain management center or pain clinic. How to manage pain without the use of opioids Use non-opioid medicines Non-opioid medicines for pain may include: Over-the-counter or prescription non-steroidal anti-inflammatory drugs (NSAIDs). These may be the first medicines used for pain. They work well for muscle and bone pain, and they reduce swelling. Acetaminophen. This over-the-counter medicine may work well for milder pain but not swelling. Antidepressants. These may be used to treat chronic pain. A certain type of antidepressant (tricyclics) is often used. These medicines are given in lower doses for pain than when used for depression. Anticonvulsants. These are usually used to treat seizures but may also reduce nerve (neuropathic) pain. Muscle relaxants. These relieve pain caused by sudden muscle tightening (spasms). You may also use a pain medicine that is applied to the skin as a patch, cream, or gel (topical analgesic), such as a numbing medicine. These may cause fewer side effects than medicines taken by mouth. Do certain therapies as directed Some therapies can help with pain management. They include: Physical therapy. You will do exercises to gain strength and flexibility. A physical therapist may teach you exercises to move and stretch parts of your body that are weak, stiff, or painful. You can  learn these exercises at physical therapy visits and practice them at home. Physical therapy may also involve: Massage. Heat wraps or applying heat or cold to affected areas. Electrical signals that interrupt pain signals (transcutaneous electrical nerve stimulation, TENS). Weak lasers that reduce pain and swelling (low-level laser therapy). Signals from your body that help you learn to regulate pain (biofeedback). Occupational therapy. This helps you to learn ways to function at home and work with less pain. Recreational therapy. This involves trying new activities or hobbies, such as a physical activity or drawing. Mental health therapy, including: Cognitive behavioral therapy (CBT). This helps you learn coping skills for dealing with pain. Acceptance and commitment therapy (ACT) to change the way you think and react to pain. Relaxation therapies, including muscle relaxation exercises and mindfulness-based stress reduction. Pain management counseling. This may be individual, family, or group counseling.  Receive medical treatments Medical treatments for pain management include: Nerve block injections. These may include a pain blocker and anti-inflammatory medicines. You may have injections: Near the spine to relieve chronic back or neck pain. Into joints to relieve back or joint pain. Into nerve areas that supply a painful area to relieve body pain. Into muscles (trigger point injections) to relieve some painful muscle conditions. A medical device placed near your spine to help block pain signals and relieve nerve pain or chronic back pain (spinal cord stimulation device). Acupuncture. Follow these instructions at home Medicines Take over-the-counter and prescription medicines only as told by your health care provider. If you are taking pain medicine, ask your health care providers about possible side effects to watch out for. Do not drive or use heavy machinery while taking prescription  opioid pain medicine. Lifestyle  Do not use drugs or alcohol to reduce pain. If you drink alcohol, limit how much you have to: 0-1 drink a day for women who are not pregnant. 0-2 drinks a day for men. Know how much alcohol is in a drink. In the [place] one drink equals one 12 oz bottle of beer (355 mL), one 5 oz glass of wine (148 mL),  or one 1 oz glass of hard liquor (44 mL). Do not use any products that contain nicotine or tobacco. These products include cigarettes, chewing tobacco, and vaping devices, such as e-cigarettes. If you need help quitting, ask your health care provider. Eat a healthy diet and maintain a healthy weight. Poor diet and excess weight may make pain worse. Eat foods that are high in fiber. These include fresh fruits and vegetables, whole grains, and beans. Limit foods that are high in fat and processed sugars, such as fried and sweet foods. Exercise regularly. Exercise lowers stress and may help relieve pain. Ask your health care provider what activities and exercises are safe for you. If your health care provider approves, join an exercise class that combines movement and stress reduction. Examples include yoga and tai chi. Get enough sleep. Lack of sleep may make pain worse. Lower stress as much as possible. Practice stress reduction techniques as told by your therapist. General instructions Work with all your pain management providers to find the treatments that work best for you. You are an important member of your pain management team. There are many things you can do to reduce pain on your own. Consider joining an online or in-person support group for people who have chronic pain. Keep all follow-up visits. This is important. Where to find more information You can find more information about managing pain without opioids from: American Academy of Pain Medicine: painmed.org Institute for Chronic Pain: instituteforchronicpain.org American Chronic Pain Association:  theacpa.org Contact a health care provider if: You have side effects from pain medicine. Your pain gets worse or does not get better with treatments or home therapy. You are struggling with anxiety or depression. Summary Many types of pain can be managed without opioids. Chronic pain may respond better to pain management without opioids. Pain is best managed when you and a team of health care providers work together. Pain management without opioids may include non-opioid medicines, medical treatments, physical therapy, mental health therapy, and lifestyle changes. Tell your health care providers if your pain gets worse or is not being managed well enough. This information is not intended to replace advice given to you by your health care provider. Make sure you discuss any questions you have with your health care provider. Document Revised: 04/19/2020 Document Reviewed: 04/19/2020 Elsevier Patient Education  2024 [employer].

## 2022-08-08 NOTE — Progress Notes (Signed)
Subjective:   Alexander [name] is a 77 y.o. male who presents for Medicare Annual/Subsequent preventive examination.  Visit Complete: Virtual  I connected with  Alexander [name] on 08/08/22 by a audio enabled telemedicine application and verified that I am speaking with the correct person using two identifiers.  Patient Location: Home  Provider Location: Office/Clinic  I discussed the limitations of evaluation and management by telemedicine. The patient expressed understanding and agreed to proceed.  Review of Systems     Cardiac Risk Factors include: advanced age (>61men, >52 women);male gender;hypertension;diabetes mellitus;smoking/ tobacco exposure     Objective:    Today's Vitals   08/08/22 0827  Weight: 129 lb (58.5 kg)  Height: 5\' 5"  (1.651 m)   Body mass index is 21.47 kg/m.     08/08/2022    8:32 AM 08/01/2021    9:18 AM 07/02/2021    9:10 AM 06/22/2020    8:40 AM 06/06/2020    7:57 AM 05/10/2020    9:38 AM 08/07/2019    8:37 PM  Advanced Directives  Does Patient Have a Medical Advance Directive? Yes Yes No No Yes No No  Type of Estate agent of [place];Living will Healthcare Power of [place];Living will   Healthcare Power of Attorney    Does patient want to make changes to medical advance directive?    No - Patient declined No - Patient declined    Copy of Healthcare Power of Attorney in Chart? No - copy requested No - copy requested       Would patient like information on creating a medical advance directive?      Yes (MAU/Ambulatory/Procedural Areas - Information given)     Current Medications (verified) Outpatient Encounter Medications as of 08/08/2022  Medication Sig   amLODipine (NORVASC) 10 MG tablet Take 1 tablet (10 mg total) by mouth daily.   aspirin EC 81 MG tablet Take 81 mg by mouth daily as needed for mild pain.    atorvastatin (LIPITOR) 40 MG tablet Take 1 tablet (40 mg total) by mouth daily.   Cholecalciferol (VITAMIN D3) 50  MCG (2000 UT) TABS Take by mouth.   diphenhydrAMINE (BENADRYL) 25 MG tablet Take 25 mg by mouth every 6 (six) hours as needed.   fexofenadine (ALLEGRA) 180 MG tablet Take 1 tablet (180 mg total) by mouth daily.   fluticasone (FLONASE) 50 MCG/ACT nasal spray Place 2 sprays into both nostrils daily.   gabapentin (NEURONTIN) 300 MG capsule Take 3 capsules (900 mg total) by mouth 2 (two) times daily.   glipiZIDE (GLUCOTROL) 5 MG tablet TAKE 1 TABLET BY MOUTH TWICE  DAILY BEFORE A MEAL   glipiZIDE (GLUCOTROL) 5 MG tablet Take 0.5 tablets (2.5 mg total) by mouth 2 (two) times daily before a meal.   HYDROcodone-acetaminophen (NORCO/VICODIN) 5-325 MG tablet Take 1 tablet by mouth every 6 (six) hours as needed for moderate pain.   metFORMIN (GLUCOPHAGE) 1000 MG tablet TAKE 1 TABLET BY MOUTH TWICE  DAILY WITH A MEAL   montelukast (SINGULAIR) 10 MG tablet Take 1 tablet (10 mg total) by mouth daily.   sildenafil (VIAGRA) 100 MG tablet Take 1 tablet (100 mg total) by mouth as needed 1hr prior to sexual activities   tiZANidine (ZANAFLEX) 2 MG tablet TAKE 1 TABLET BY MOUTH EVERY 6  HOURS AS NEEDED FOR MUSCLE  SPASM(S)   triamcinolone cream (KENALOG) 0.1 % APPLY TOPICALLY TWICE DAILY AS  NEEDED   No facility-administered encounter medications on file as  of 08/08/2022.    Allergies (verified) Lisinopril   History: Past Medical History:  Diagnosis Date   Angio-edema    CAD (coronary artery disease)    Per [facility] New Patient Packet    Carpal tunnel syndrome    Per [facility] New Patient Packet    Diabetes mellitus without complication (HCC)    Hypertension    Neuromuscular disorder (HCC)    Peripheral arterial disease (HCC) 11/14/2016   non-occlusive, asymptomatic, seen by vascular surgery Dr. Darrick Penna who rec repeated ABIs annually   Prostate cancer [facility])    Past Surgical History:  Procedure Laterality Date   CATARACT EXTRACTION Right 08/23/2020   COLON SURGERY     "locked bowel" fixed   COLONOSCOPY  2019    HERNIA REPAIR     LEFT HEART CATHETERIZATION WITH CORONARY ANGIOGRAM N/A 09/15/2013   Procedure: LEFT HEART CATHETERIZATION WITH CORONARY ANGIOGRAM;  Surgeon: Peter M Swaziland, MD;  Location: [facility] CATH LAB;  Service: Cardiovascular;  Laterality: N/A;   PROSTATE SURGERY  2005   Family History  Problem Relation Age of Onset   Diabetes Mother    Depression Father    Hypertension Father    Diabetes Father    Thyroid disease Father    Hypertension Sister    Diabetes Sister    Congestive Heart Failure Sister    Kidney failure Sister    Diabetes Sister    Lung cancer Brother    Drug abuse Brother    Breast cancer Neg Hx    Prostate cancer Neg Hx    Colon cancer Neg Hx    Social History   Socioeconomic History   Marital status: Widowed    Spouse name: Not on file   Number of children: 2   Years of education: 9   Highest education level: Not on file  Occupational History   Occupation: retired    Comment: Ecologist  Tobacco Use   Smoking status: Every Day    Types: Cigarettes   Smokeless tobacco: Never  Vaping Use   Vaping status: Never Used  Substance and Sexual Activity   Alcohol use: No   Drug use: No   Sexual activity: Yes  Other Topics Concern   Not on file  Social History Narrative   Patient is single and his daughter lives with him.   Patient has two children.   Patient has a 9th grade education.   Patient works in [employer] (part-time).   Patient drinks one to two cups of coffee daily.   Patient is right-handed.         Per [facility] New Patient Packet:   Diet:Left blank      Caffeine:Yes      Married, if yes what year: Widowed      Do you live in a house, apartment, assisted living, condo, trailer, ect: House, 3 persons      Is it one or more stories: One      Pets: No      Current/Past profession: Recruitment consultant      Highest level or education completed:       Exercise:         No         Type and how often:          Living Will: No   DNR: No    POA/HPOA: No      Functional Status:   Do you have difficulty bathing or dressing yourself? No   Do you have difficulty  preparing food or eating? No   Do you have difficulty managing your medications? No   Do you have difficulty managing your finances? No   Do you have difficulty affording your medications? No   Social Determinants of Health   Financial Resource Strain: Low Risk  (08/08/2022)   Overall Financial Resource Strain (CARDIA)    Difficulty of Paying Living Expenses: Not very hard  Food Insecurity: No Food Insecurity (08/08/2022)   Hunger Vital Sign    Worried About Running Out of Food in the Last Year: Never true    Ran Out of Food in the Last Year: Never true  Transportation Needs: Unknown (08/08/2022)   PRAPARE - Administrator, Civil Service (Medical): No    Lack of Transportation (Non-Medical): Not on file  Physical Activity: Sufficiently Active (08/01/2021)   Exercise Vital Sign    Days of Exercise per Week: 5 days    Minutes of Exercise per Session: 30 min  Stress: No Stress Concern Present (08/08/2022)   [employer] of Occupational Health - Occupational Stress Questionnaire    Feeling of Stress : Not at all  Social Connections: Moderately Isolated (08/08/2022)   Social Connection and Isolation Panel [NHANES]    Frequency of Communication with Friends and Family: More than three times a week    Frequency of Social Gatherings with Friends and Family: Three times a week    Attends Religious Services: 1 to 4 times per year    Active Member of Clubs or Organizations: No    Attends Banker Meetings: Never    Marital Status: Widowed    Tobacco Counseling Ready to quit: Not Answered Counseling given: Not Answered   Clinical Intake:     Pain : No/denies pain     BMI - recorded: 21.47 Nutritional Status: BMI of 19-24  Normal Nutritional Risks: None Diabetes: Yes CBG done?: No Did pt. bring in CBG monitor from home?: No  How  often do you need to have someone help you when you read instructions, pamphlets, or other written materials from your doctor or pharmacy?: 1 - Never  Interpreter Needed?: No  Information entered by :: Ashanti Littles, RMA   Activities of Daily Living    08/08/2022    8:29 AM  In your present state of health, do you have any difficulty performing the following activities:  Hearing? 0  Vision? 0  Difficulty concentrating or making decisions? 0  Walking or climbing stairs? 0  Dressing or bathing? 0  Doing errands, shopping? 0  Preparing Food and eating ? N  Using the Toilet? N  In the past six months, have you accidently leaked urine? N  Do you have problems with loss of bowel control? N  Managing your Medications? N  Managing your Finances? N  Housekeeping or managing your Housekeeping? N    Patient Care Team: Shade Flood, MD as PCP - General (Family Medicine) Jeani Hawking, MD as Consulting Physician (Gastroenterology) Sallye Lat, MD as Consulting Physician (Ophthalmology) Joline Maxcy, MD as Referring Physician (Urology) Felicita Gage, RN Nurse Navigator as Registered Nurse (Medical Oncology) Sinda Du, MD as Consulting Physician (Ophthalmology) Callie Fielding, MD as Consulting Physician (Physical Medicine and Rehabilitation) Tomma Lightning, MD as Consulting Physician (Pulmonary Disease) Tat, Octaviano Batty, DO as Consulting Physician (Neurology)  Indicate any recent Medical Services you may have received from other than [facility] providers in the past year (date may be approximate).     Assessment:  This is a routine wellness examination for Britney.  Hearing/Vision screen Hearing Screening - Comments:: Denies hearing difficulties   Vision Screening - Comments:: Wears eyeglasses  Dietary issues and exercise activities discussed:     Goals Addressed               This Visit's Progress     Quit smoking / using tobacco (pt-stated)   On track      Patient states that he would like to cut back on smoking  I have cut back some, now smoking 1/2 a pack daily.      Depression Screen    08/08/2022    8:39 AM 05/02/2022   10:20 AM 01/31/2022    9:43 AM 09/27/2021   10:22 AM 08/01/2021    9:18 AM 08/01/2021    9:17 AM 06/14/2021   10:22 AM  PHQ 2/9 Scores  PHQ - 2 Score 0 0 0 0 0 0 0  PHQ- 9 Score 0 0  0       Fall Risk    08/08/2022    8:32 AM 05/02/2022   10:20 AM 01/31/2022    9:43 AM 09/27/2021   10:22 AM 08/01/2021    9:18 AM  Fall Risk   Falls in the past year? 0 0 0 0 0  Number falls in past yr: 0 0  0 0  Injury with Fall? 0 0 0 0 0  Risk for fall due to : No Fall Risks;Medication side effect No Fall Risks No Fall Risks;Impaired vision No Fall Risks   Follow up Falls prevention discussed  Falls evaluation completed Falls evaluation completed Falls evaluation completed;Education provided    MEDICARE RISK AT HOME:  Medicare Risk at Home - 08/08/22 0833     Any stairs in or around the home? Yes    If so, are there any without handrails? Yes    Home free of loose throw rugs in walkways, pet beds, electrical cords, etc? Yes    Adequate lighting in your home to reduce risk of falls? Yes    Life alert? No    Use of a cane, walker or w/c? Yes   per patien, cane sometimes   Grab bars in the bathroom? No    Shower chair or bench in shower? No    Elevated toilet seat or a handicapped toilet? No             TIMED UP AND GO:  Was the test performed?  No    Cognitive Function:        08/08/2022    8:34 AM 06/22/2020    8:40 AM 11/11/2018    8:05 AM 10/28/2016    9:28 AM  6CIT Screen  What Year? 0 points 0 points 0 points 0 points  What month? 0 points 0 points 0 points 0 points  What time? 0 points 0 points 0 points 0 points  Count back from 20 0 points 0 points 0 points 0 points  Months in reverse 0 points 4 points 0 points 4 points  Repeat phrase 6 points 2 points 0 points 2 points  Total Score 6 points 6 points 0  points 6 points    Immunizations Immunization History  Administered Date(s) Administered   PFIZER(Purple Top)SARS-COV-2 Vaccination 03/03/2019, 03/24/2019, 10/26/2019   Td 07/17/2014    TDAP status: Up to date  Flu Vaccine status: Declined, Education has been provided regarding the importance of this vaccine but patient still declined. Advised may  receive this vaccine at local pharmacy or Health Dept. Aware to provide a copy of the vaccination record if obtained from local pharmacy or Health Dept. Verbalized acceptance and understanding.  Pneumococcal vaccine status: Declined,  Education has been provided regarding the importance of this vaccine but patient still declined. Advised may receive this vaccine at local pharmacy or Health Dept. Aware to provide a copy of the vaccination record if obtained from local pharmacy or Health Dept. Verbalized acceptance and understanding.   Covid-19 vaccine status: Completed vaccines  Qualifies for Shingles Vaccine? Yes   Zostavax completed No   Shingrix Completed?: No.    Education has been provided regarding the importance of this vaccine. Patient has been advised to call insurance company to determine out of pocket expense if they have not yet received this vaccine. Advised may also receive vaccine at local pharmacy or Health Dept. Verbalized acceptance and understanding.  Screening Tests Health Maintenance  Topic Date Due   Pneumonia Vaccine 65+ Years old (1 of 2 - PCV) Never done   Zoster Vaccines- Shingrix (1 of 2) Never done   COVID-19 Vaccine (4 - 2023-24 season) 09/21/2021   Lung Cancer Screening  09/07/2022   INFLUENZA VACCINE  08/22/2022   HEMOGLOBIN A1C  11/01/2022   Diabetic kidney evaluation - Urine ACR  02/01/2023   Diabetic kidney evaluation - eGFR measurement  05/02/2023   FOOT EXAM  05/02/2023   OPHTHALMOLOGY EXAM  05/27/2023   Colonoscopy  05/31/2023   Medicare Annual Wellness (AWV)  08/08/2023   DTaP/Tdap/Td (2 - Tdap)  07/16/2024   Hepatitis C Screening  Completed   HPV VACCINES  Aged Out    Health Maintenance  Health Maintenance Due  Topic Date Due   Pneumonia Vaccine 17+ Years old (1 of 2 - PCV) Never done   Zoster Vaccines- Shingrix (1 of 2) Never done   COVID-19 Vaccine (4 - 2023-24 season) 09/21/2021   Lung Cancer Screening  09/07/2022    Colorectal cancer screening: No longer required.   Lung Cancer Screening: (Low Dose CT Chest recommended if Age 60-80 years, 20 pack-year currently smoking OR have quit w/in 15years.) does qualify.   Lung Cancer Screening Referral: Patient has an appointment for CT chest on 09/09/2022  Additional Screening:  Hepatitis C Screening: does qualify; Completed 01/12/2015  Vision Screening: Recommended annual ophthalmology exams for early detection of glaucoma and other disorders of the eye. Is the patient up to date with their annual eye exam?  Yes  Who is the provider or what is the name of the office in which the patient attends annual eye exams? [place] If pt is not established with a provider, would they like to be referred to a provider to establish care? No .   Dental Screening: Recommended annual dental exams for proper oral hygiene  Diabetic Foot Exam: Diabetic Foot Exam: Completed 05/02/2022  Community Resource Referral / Chronic Care Management: CRR required this visit?  No   CCM required this visit?  No     Plan:     I have personally reviewed and noted the following in the patient's chart:   Medical and social history Use of alcohol, tobacco or illicit drugs  Current medications and supplements including opioid prescriptions. Patient is currently taking opioid prescriptions. Information provided to patient regarding non-opioid alternatives. Patient advised to discuss non-opioid treatment plan with their provider. Functional ability and status Nutritional status Physical activity Advanced directives List of other  physicians Hospitalizations, surgeries, and ER  visits in previous 12 months Vitals Screenings to include cognitive, depression, and falls Referrals and appointments  In addition, I have reviewed and discussed with patient certain preventive protocols, quality metrics, and best practice recommendations. A written personalized care plan for preventive services as well as general preventive health recommendations were provided to patient.     Ilyssa Grennan L Soniyah Mcglory, CMA   08/08/2022   After Visit Summary: (MyChart) Due to this being a telephonic visit, the after visit summary with patients personalized plan was offered to patient via MyChart   Nurse Notes: Patient has a Lung Cancer Screening appointment on 09/09/22.  His last colonoscopy was in 2019 recommending to repeat in 3-5 years.  This year is year #5 but patient is age 74. Please discuss during your up coming office visit with PCP.

## 2022-08-15 ENCOUNTER — Encounter: Payer: Self-pay | Admitting: Family Medicine

## 2022-08-15 ENCOUNTER — Ambulatory Visit (INDEPENDENT_AMBULATORY_CARE_PROVIDER_SITE_OTHER): Payer: Medicare Other | Admitting: Family Medicine

## 2022-08-15 VITALS — BP 124/60 | HR 71 | Temp 97.8°F | Ht 65.0 in | Wt 127.2 lb

## 2022-08-15 DIAGNOSIS — R748 Abnormal levels of other serum enzymes: Secondary | ICD-10-CM

## 2022-08-15 DIAGNOSIS — E1159 Type 2 diabetes mellitus with other circulatory complications: Secondary | ICD-10-CM

## 2022-08-15 DIAGNOSIS — Z7984 Long term (current) use of oral hypoglycemic drugs: Secondary | ICD-10-CM

## 2022-08-15 LAB — COMPREHENSIVE METABOLIC PANEL
ALT: 12 U/L (ref 0–53)
AST: 17 U/L (ref 0–37)
Albumin: 4.3 g/dL (ref 3.5–5.2)
Alkaline Phosphatase: 105 U/L (ref 39–117)
BUN: 16 mg/dL (ref 6–23)
CO2: 25 mEq/L (ref 19–32)
Calcium: 10.2 mg/dL (ref 8.4–10.5)
Chloride: 108 mEq/L (ref 96–112)
Creatinine, Ser: 1.1 mg/dL (ref 0.40–1.50)
GFR: 65.26 mL/min (ref >60.00)
Glucose, Bld: 87 mg/dL (ref 70–99)
Potassium: 4.6 mEq/L (ref 3.5–5.1)
Sodium: 141 mEq/L (ref 135–145)
Total Bilirubin: 0.6 mg/dL (ref 0.2–1.2)
Total Protein: 7.8 g/dL (ref 6.0–8.3)

## 2022-08-15 LAB — HEMOGLOBIN A1C: Hgb A1c MFr Bld: 7.7 % — ABNORMAL HIGH (ref 4.6–6.5)

## 2022-08-15 NOTE — Progress Notes (Signed)
Subjective:  Patient ID: Alexander [name], male    DOB: [birth year]  Age: 77 y.o. MRN: [id number]  CC:  Chief Complaint  Patient presents with   Medical Management of Chronic Issues    Pt doing well    HPI [name] presents for   Diabetes: Complicated by glycemia, microalbuminuria.  Treated with metformin, glipizide with prior dosage adjustments to minimize risk of hypoglycemia.  A1c has been increasing from 7.3-7.9 to most recently 8.5 in April.  Was on glipizide 5 mg half in the morning, whole pill at night at that visit along with metformin 1000 mg twice daily.  He is on statin.  Denied any hypoglycemic symptoms at that time.  Recommended increasing full pill of glipizide in the morning with nighttime dosing.  6-week follow-up recommended in April. Feel fine. Taking glipizide full pill BID, no symptoms of low blood sugar, not checking home readings.  Microalbumin: Normal ratio in January. Optho, foot exam, pneumovax: Has refused pneumonia vaccine.  Lab Results  Component Value Date   HGBA1C 8.5 (H) 05/02/2022   HGBA1C 7.9 (H) 01/31/2022   HGBA1C 7.3 (H) 09/27/2021   Lab Results  Component Value Date   MICROALBUR 1.3 01/31/2022   LDLCALC 46 05/02/2022   CREATININE 1.30 (H) 07/13/2022   Elevated alkaline phosphatase Also noted to have slight elevated alkaline phosphatase at last visit, plan for recheck with next lab work.no abd pain, n/v, or scleral icterus.  No alcohol. Quit in 2003, heavier use prior.  Lab Results  Component Value Date   ALT 15 05/02/2022   AST 15 05/02/2022   ALKPHOS 144 (H) 05/02/2022   BILITOT 0.5 05/02/2022    History Patient Active Problem List   Diagnosis Date Noted   Right carpal tunnel syndrome 06/08/2019   Malignant neoplasm of prostate (HCC) 10/27/2018   Allergic reaction 08/10/2018   Angioedema 08/10/2018   Peripheral arterial occlusive disease (HCC) 10/28/2016   Hyperlipidemia LDL goal <70 05/01/2015   Tobacco use disorder  12/09/2013   Coronary artery disease involving native coronary artery of native heart without angina pectoris 12/09/2013   Erectile dysfunction due to diseases classified elsewhere 12/09/2013   Type 2 diabetes mellitus with other circulatory complications (HCC) 12/09/2013   Left lumbar radiculopathy 09/30/2012   Chronic rhinitis 04/08/2011   Hypertension    HTN (hypertension), benign 03/09/2011   History of prostate cancer 03/09/2011   Past Medical History:  Diagnosis Date   Angio-edema    CAD (coronary artery disease)    Per [facility] New Patient Packet    Carpal tunnel syndrome    Per [facility] New Patient Packet    Diabetes mellitus without complication (HCC)    Hypertension    Neuromuscular disorder (HCC)    Peripheral arterial disease (HCC) 11/14/2016   non-occlusive, asymptomatic, seen by vascular surgery Dr. Darrick Penna who rec repeated ABIs annually   Prostate cancer [facility])    Past Surgical History:  Procedure Laterality Date   CATARACT EXTRACTION Right 08/23/2020   COLON SURGERY     "locked bowel" fixed   COLONOSCOPY  2019   HERNIA REPAIR     LEFT HEART CATHETERIZATION WITH CORONARY ANGIOGRAM N/A 09/15/2013   Procedure: LEFT HEART CATHETERIZATION WITH CORONARY ANGIOGRAM;  Surgeon: Peter M Swaziland, MD;  Location: [facility] CATH LAB;  Service: Cardiovascular;  Laterality: N/A;   PROSTATE SURGERY  2005   Allergies  Allergen Reactions   Lisinopril Swelling    angioedema   Prior to Admission medications  Medication Sig Start Date End Date Taking? Authorizing Provider  amLODipine (NORVASC) 10 MG tablet Take 1 tablet (10 mg total) by mouth daily. 07/16/22  Yes Shade Flood, MD  aspirin EC 81 MG tablet Take 81 mg by mouth daily as needed for mild pain.    Yes [provider]  atorvastatin (LIPITOR) 40 MG tablet Take 1 tablet (40 mg total) by mouth daily. 01/31/22  Yes Shade Flood, MD  Cholecalciferol (VITAMIN D3) 50 MCG (2000 UT) TABS Take by mouth.   Yes [provider]  diphenhydrAMINE (BENADRYL) 25 MG tablet Take 25 mg by mouth every 6 (six) hours as needed.   Yes [provider]  fexofenadine (ALLEGRA) 180 MG tablet Take 1 tablet (180 mg total) by mouth daily. 08/10/18  Yes Bobbitt, Heywood Iles, MD  fluticasone [facility]) 50 MCG/ACT nasal spray Place 2 sprays into both nostrils daily. 03/18/19  Yes Lezlie Lye, Meda Coffee, MD  gabapentin (NEURONTIN) 300 MG capsule TAKE 3 CAPSULES BY MOUTH TWICE  DAILY 08/09/22  Yes Shade Flood, MD  glipiZIDE (GLUCOTROL) 5 MG tablet TAKE 1 TABLET BY MOUTH TWICE  DAILY BEFORE A MEAL 10/24/21  Yes Shade Flood, MD  glipiZIDE (GLUCOTROL) 5 MG tablet Take 0.5 tablets (2.5 mg total) by mouth 2 (two) times daily before a meal. 10/24/21  Yes Shade Flood, MD  HYDROcodone-acetaminophen (NORCO/VICODIN) 5-325 MG tablet Take 1 tablet by mouth every 6 (six) hours as needed for moderate pain. 07/19/22  Yes Shade Flood, MD  metFORMIN (GLUCOPHAGE) 1000 MG tablet TAKE 1 TABLET BY MOUTH TWICE  DAILY WITH A MEAL 12/19/21  Yes Shade Flood, MD  montelukast (SINGULAIR) 10 MG tablet Take 1 tablet (10 mg total) by mouth daily. 07/17/22  Yes Shade Flood, MD  sildenafil (VIAGRA) 100 MG tablet Take 1 tablet (100 mg total) by mouth as needed 1hr prior to sexual activities 12/21/21  Yes   tiZANidine (ZANAFLEX) 2 MG tablet TAKE 1 TABLET BY MOUTH EVERY 6  HOURS AS NEEDED FOR MUSCLE  SPASM(S) 02/07/22  Yes Shade Flood, MD  triamcinolone cream (KENALOG) 0.1 % APPLY TOPICALLY TWICE DAILY AS  NEEDED 02/07/22  Yes Shade Flood, MD   Social History   Socioeconomic History   Marital status: Widowed    Spouse name: Not on file   Number of children: 2   Years of education: 9   Highest education level: 12th grade  Occupational History   Occupation: retired    Comment: Ecologist  Tobacco Use   Smoking status: Every Day    Types: Cigarettes   Smokeless tobacco: Never  Vaping Use   Vaping status: Never Used   Substance and Sexual Activity   Alcohol use: No   Drug use: No   Sexual activity: Yes  Other Topics Concern   Not on file  Social History Narrative   Patient is single and his daughter lives with him.   Patient has two children.   Patient has a 9th grade education.   Patient works in [employer] (part-time).   Patient drinks one to two cups of coffee daily.   Patient is right-handed.         Per [facility] New Patient Packet:   Diet:Left blank      Caffeine:Yes      Married, if yes what year: Widowed      Do you live in a house, apartment, assisted living, [place], trailer, ect: House, 3 persons  Is it one or more stories: One      Pets: No      Current/Past profession: Masoner      Highest level or education completed:       Exercise:         No         Type and how often:          Living Will: No   DNR: No   POA/HPOA: No      Functional Status:   Do you have difficulty bathing or dressing yourself? No   Do you have difficulty preparing food or eating? No   Do you have difficulty managing your medications? No   Do you have difficulty managing your finances? No   Do you have difficulty affording your medications? No   Social Determinants of Health   Financial Resource Strain: Low Risk  (08/13/2022)   Overall Financial Resource Strain (CARDIA)    Difficulty of Paying Living Expenses: Not hard at all  Food Insecurity: No Food Insecurity (08/13/2022)   Hunger Vital Sign    Worried About Running Out of Food in the Last Year: Never true    Ran Out of Food in the Last Year: Never true  Transportation Needs: No Transportation Needs (08/13/2022)   PRAPARE - Administrator, Civil Service (Medical): No    Lack of Transportation (Non-Medical): No  Physical Activity: Unknown (08/13/2022)   Exercise Vital Sign    Days of Exercise per Week: Patient declined    Minutes of Exercise per Session: Not on file  Stress: No Stress Concern Present (08/13/2022)   [employer] of Occupational Health - Occupational Stress Questionnaire    Feeling of Stress : Not at all  Social Connections: Moderately Isolated (08/13/2022)   Social Connection and Isolation Panel [NHANES]    Frequency of Communication with Friends and Family: More than three times a week    Frequency of Social Gatherings with Friends and Family: More than three times a week    Attends Religious Services: 1 to 4 times per year    Active Member of [employer] or Organizations: No    Attends Banker Meetings: Never    Marital Status: Widowed  Intimate Partner Violence: Not At Risk (08/08/2022)   Humiliation, Afraid, Rape, and Kick questionnaire    Fear of Current or Ex-Partner: No    Emotionally Abused: No    Physically Abused: No    Sexually Abused: No    Review of Systems  Constitutional:  Negative for fatigue and unexpected weight change.  Eyes:  Negative for visual disturbance.  Respiratory:  Negative for cough, chest tightness and shortness of breath.   Cardiovascular:  Negative for chest pain, palpitations and leg swelling.  Gastrointestinal:  Negative for abdominal pain and blood in stool.  Neurological:  Negative for dizziness, light-headedness and headaches.     Objective:   Vitals:   08/15/22 0846  BP: 124/60  Pulse: 71  Temp: 97.8 F (36.6 C)  TempSrc: Temporal  SpO2: 96%  Weight: 127 lb 3.2 oz (57.7 kg)  Height: 5\' 5"  (1.651 m)     Physical Exam Vitals reviewed.  Constitutional:      Appearance: He is well-developed.  HENT:     Head: Normocephalic and atraumatic.  Neck:     Vascular: No carotid bruit or JVD.  Cardiovascular:     Rate and Rhythm: Normal rate and regular rhythm.     Heart sounds:  Normal heart sounds. No murmur heard. Pulmonary:     Effort: Pulmonary effort is normal.     Breath sounds: Normal breath sounds. No rales.  Musculoskeletal:     Right lower leg: No edema.     Left lower leg: No edema.  Skin:    General: Skin is warm  and dry.  Neurological:     Mental Status: He is alert and oriented to person, place, and time.  Psychiatric:        Mood and Affect: Mood normal.        Assessment & Plan:  NALIN MAZZOCCO is a 77 y.o. male . Type 2 diabetes mellitus with other circulatory complications (HCC) - Plan: Comprehensive metabolic panel, Hemoglobin A1c  -Tolerating twice daily dosing of 5 mg glipizide, continues on metformin 1000 mg.  Check updated A1c and adjust plan further.  Denies hypoglycemia symptoms, but cautioned if those occur with RTC precautions.  Elevated alkaline phosphatase level - Plan: Comprehensive metabolic panel  -Asymptomatic, slight elevation previously, has been elevated in the past and then improved.  Repeat testing.  No orders of the defined types were placed in this encounter.  Patient Instructions  Continue same dose of glipizide and metformin for now.  I will let you know if there are any concerns on labs but no changes at this time.  Thank you for coming into the office today.  Follow-up in 3 months but sooner if any new symptoms and watch for low blood sugar symptoms.  Follow-up sooner if those occur.    Signed,   Meredith Staggers, MD [facility], [facility] 08/15/22 9:19 AM

## 2022-08-15 NOTE — Patient Instructions (Signed)
Continue same dose of glipizide and metformin for now.  I will let you know if there are any concerns on labs but no changes at this time.  Thank you for coming into the office today.  Follow-up in 3 months but sooner if any new symptoms and watch for low blood sugar symptoms.  Follow-up sooner if those occur.

## 2022-08-30 DIAGNOSIS — I10 Essential (primary) hypertension: Secondary | ICD-10-CM | POA: Diagnosis not present

## 2022-08-30 DIAGNOSIS — E1165 Type 2 diabetes mellitus with hyperglycemia: Secondary | ICD-10-CM | POA: Diagnosis not present

## 2022-08-30 DIAGNOSIS — F17200 Nicotine dependence, unspecified, uncomplicated: Secondary | ICD-10-CM | POA: Diagnosis not present

## 2022-09-09 ENCOUNTER — Ambulatory Visit (HOSPITAL_BASED_OUTPATIENT_CLINIC_OR_DEPARTMENT_OTHER)
Admission: RE | Admit: 2022-09-09 | Discharge: 2022-09-09 | Disposition: A | Discharge status: Home / Self Care | Payer: Medicare Other | Source: Ambulatory Visit | Attending: Critical Care Medicine | Admitting: Critical Care Medicine

## 2022-09-09 DIAGNOSIS — Z87891 Personal history of nicotine dependence: Secondary | ICD-10-CM | POA: Insufficient documentation

## 2022-09-09 DIAGNOSIS — F17210 Nicotine dependence, cigarettes, uncomplicated: Secondary | ICD-10-CM | POA: Diagnosis not present

## 2022-09-09 DIAGNOSIS — Z122 Encounter for screening for malignant neoplasm of respiratory organs: Secondary | ICD-10-CM | POA: Insufficient documentation

## 2022-09-16 ENCOUNTER — Other Ambulatory Visit: Payer: Self-pay | Admitting: Acute Care

## 2022-09-16 DIAGNOSIS — Z122 Encounter for screening for malignant neoplasm of respiratory organs: Secondary | ICD-10-CM

## 2022-09-16 DIAGNOSIS — F17210 Nicotine dependence, cigarettes, uncomplicated: Secondary | ICD-10-CM

## 2022-09-16 DIAGNOSIS — Z87891 Personal history of nicotine dependence: Secondary | ICD-10-CM

## 2022-09-23 ENCOUNTER — Other Ambulatory Visit: Payer: Self-pay | Admitting: Family Medicine

## 2022-09-23 DIAGNOSIS — I10 Essential (primary) hypertension: Secondary | ICD-10-CM

## 2022-10-07 DIAGNOSIS — M62838 Other muscle spasm: Secondary | ICD-10-CM | POA: Diagnosis not present

## 2022-10-07 DIAGNOSIS — M6281 Muscle weakness (generalized): Secondary | ICD-10-CM | POA: Diagnosis not present

## 2022-10-16 ENCOUNTER — Other Ambulatory Visit (HOSPITAL_COMMUNITY): Payer: Self-pay | Admitting: Urology

## 2022-10-16 DIAGNOSIS — C61 Malignant neoplasm of prostate: Secondary | ICD-10-CM

## 2022-10-18 ENCOUNTER — Other Ambulatory Visit: Payer: Self-pay | Admitting: Family Medicine

## 2022-10-18 DIAGNOSIS — J310 Chronic rhinitis: Secondary | ICD-10-CM

## 2022-10-23 ENCOUNTER — Encounter (HOSPITAL_COMMUNITY)
Admission: RE | Admit: 2022-10-23 | Discharge: 2022-10-23 | Disposition: A | Discharge status: Home / Self Care | Payer: Medicare Other | Source: Ambulatory Visit | Attending: Urology | Admitting: Urology

## 2022-10-23 DIAGNOSIS — C61 Malignant neoplasm of prostate: Secondary | ICD-10-CM | POA: Diagnosis present

## 2022-10-23 MED ORDER — FLOTUFOLASTAT F 18 GALLIUM 296-5846 MBQ/ML IV SOLN
7.8000 | Freq: Once | INTRAVENOUS | Status: AC
  Administered 2022-10-23: 7.8 via INTRAVENOUS

## 2022-11-02 ENCOUNTER — Other Ambulatory Visit: Payer: Self-pay | Admitting: Family Medicine

## 2022-11-02 DIAGNOSIS — J310 Chronic rhinitis: Secondary | ICD-10-CM

## 2022-11-15 ENCOUNTER — Encounter: Payer: Self-pay | Admitting: Family Medicine

## 2022-11-15 ENCOUNTER — Ambulatory Visit: Payer: Medicare Other | Admitting: Family Medicine

## 2022-11-15 VITALS — BP 124/68 | HR 64 | Temp 97.8°F | Ht 65.0 in | Wt 130.0 lb

## 2022-11-15 DIAGNOSIS — M5416 Radiculopathy, lumbar region: Secondary | ICD-10-CM | POA: Diagnosis not present

## 2022-11-15 DIAGNOSIS — E785 Hyperlipidemia, unspecified: Secondary | ICD-10-CM

## 2022-11-15 DIAGNOSIS — I10 Essential (primary) hypertension: Secondary | ICD-10-CM

## 2022-11-15 DIAGNOSIS — Z7984 Long term (current) use of oral hypoglycemic drugs: Secondary | ICD-10-CM | POA: Diagnosis not present

## 2022-11-15 DIAGNOSIS — E1159 Type 2 diabetes mellitus with other circulatory complications: Secondary | ICD-10-CM | POA: Diagnosis not present

## 2022-11-15 LAB — LIPID PANEL
Cholesterol: 93 mg/dL (ref 0–200)
HDL: 39.3 mg/dL (ref >39.00)
LDL Cholesterol: 45 mg/dL (ref 0–99)
NonHDL: 54.01
Total CHOL/HDL Ratio: 2
Triglycerides: 47 mg/dL (ref 0.0–149.0)
VLDL: 9.4 mg/dL (ref 0.0–40.0)

## 2022-11-15 LAB — COMPREHENSIVE METABOLIC PANEL
ALT: 13 U/L (ref 0–53)
AST: 18 U/L (ref 0–37)
Albumin: 4.5 g/dL (ref 3.5–5.2)
Alkaline Phosphatase: 107 U/L (ref 39–117)
BUN: 14 mg/dL (ref 6–23)
CO2: 27 meq/L (ref 19–32)
Calcium: 10.3 mg/dL (ref 8.4–10.5)
Chloride: 105 meq/L (ref 96–112)
Creatinine, Ser: 0.98 mg/dL (ref 0.40–1.50)
GFR: 74.83 mL/min (ref >60.00)
Glucose, Bld: 59 mg/dL — ABNORMAL LOW (ref 70–99)
Potassium: 4.5 meq/L (ref 3.5–5.1)
Sodium: 140 meq/L (ref 135–145)
Total Bilirubin: 0.5 mg/dL (ref 0.2–1.2)
Total Protein: 8.3 g/dL (ref 6.0–8.3)

## 2022-11-15 LAB — HEMOGLOBIN A1C: Hgb A1c MFr Bld: 6.9 % — ABNORMAL HIGH (ref 4.6–6.5)

## 2022-11-15 MED ORDER — METFORMIN HCL 1000 MG PO TABS: 1000.0000 mg | ORAL_TABLET | Freq: Two times a day (BID) | ORAL | 2 refills | Status: DC

## 2022-11-15 MED ORDER — ATORVASTATIN CALCIUM 40 MG PO TABS: 40.0000 mg | ORAL_TABLET | Freq: Every day | ORAL | 3 refills | Status: DC

## 2022-11-15 MED ORDER — GLIPIZIDE 5 MG PO TABS: 5.0000 mg | ORAL_TABLET | Freq: Two times a day (BID) | ORAL | 2 refills | Status: DC

## 2022-11-15 NOTE — Progress Notes (Signed)
Subjective:  Patient ID: Alexander [name], male    DOB: Apr 27, 1945  Age: 77 y.o. MRN: [id number]  CC:  Chief Complaint  Patient presents with   Medical Management of Chronic Issues    Pt is doing well, no questions at this time     HPI [name] presents for follow up - no health changes.   Diabetes: Complicated by hyperglycemia, microalbuminuria.  Treated with metformin, glipizide, prior hypoglycemia, with medication adjustments needed previously.  Improved A1c in July.  5 mg glipizide twice daily at that time without hypoglycemic symptoms.  Metformin 1000 mg twice daily at that time. He is on statin with Lipitor 40 mg daily, no new myalgias, arthralgias or side effects. No home readings. No symptoms of low blood sugar. Microalbumin: Normal ratio 01/31/2022 Optho, foot exam, pneumovax:  Pneumonia vaccine recommended as well as COVID-vaccine and shingles. - declines all 3 at this time.    Lab Results  Component Value Date   HGBA1C 7.7 (H) 08/15/2022   HGBA1C 8.5 (H) 05/02/2022   HGBA1C 7.9 (H) 01/31/2022   Lab Results  Component Value Date   MICROALBUR 1.3 01/31/2022   LDLCALC 46 05/02/2022   CREATININE 1.10 08/15/2022   Hypertension: Amlodipine 10 mg daily. No med side effects. Home readings:none.  BP Readings from Last 3 Encounters:  11/15/22 124/68  08/15/22 124/60  07/23/22 128/76   Lab Results  Component Value Date   CREATININE 1.10 08/15/2022   Hyperlipidemia: On Lipitor 40 mg as above.  Prior elevated alkaline phosphatase, normalized in July.no myalgias/side effects.  Lab Results  Component Value Date   CHOL 95 05/02/2022   HDL 38.50 (L) 05/02/2022   LDLCALC 46 05/02/2022   TRIG 51.0 05/02/2022   CHOLHDL 2 05/02/2022   Lab Results  Component Value Date   ALT 12 08/15/2022   AST 17 08/15/2022   ALKPHOS 105 08/15/2022   BILITOT 0.6 08/15/2022   Chronic back pain, treated with gabapentin  - has new back specialist. Stable control of pain, no  dizziness or other se's with gabapentin.   Stable CT chest on August. Repeat in 1 yr.   History Patient Active Problem List   Diagnosis Date Noted   Right carpal tunnel syndrome 06/08/2019   Malignant neoplasm of prostate (HCC) 10/27/2018   Allergic reaction 08/10/2018   Angioedema 08/10/2018   Peripheral arterial occlusive disease (HCC) 10/28/2016   Hyperlipidemia LDL goal <70 05/01/2015   Tobacco use disorder 12/09/2013   Coronary artery disease involving native coronary artery of native heart without angina pectoris 12/09/2013   Erectile dysfunction due to diseases classified elsewhere 12/09/2013   Type 2 diabetes mellitus with other circulatory complications (HCC) 12/09/2013   Left lumbar radiculopathy 09/30/2012   Chronic rhinitis 04/08/2011   Hypertension    HTN (hypertension), benign 03/09/2011   History of prostate cancer 03/09/2011   Past Medical History:  Diagnosis Date   Angio-edema    CAD (coronary artery disease)    Per [facility] New Patient Packet    Carpal tunnel syndrome    Per [facility] New Patient Packet    Diabetes mellitus without complication (HCC)    Hypertension    Neuromuscular disorder (HCC)    Peripheral arterial disease (HCC) 11/14/2016   non-occlusive, asymptomatic, seen by vascular surgery Dr. Darrick Penna who rec repeated ABIs annually   Prostate cancer [facility])    Past Surgical History:  Procedure Laterality Date   CATARACT EXTRACTION Right 08/23/2020   COLON SURGERY     "  locked bowel" fixed   COLONOSCOPY  2019   HERNIA REPAIR     LEFT HEART CATHETERIZATION WITH CORONARY ANGIOGRAM N/A 09/15/2013   Procedure: LEFT HEART CATHETERIZATION WITH CORONARY ANGIOGRAM;  Surgeon: Peter M Swaziland, MD;  Location: [facility] CATH LAB;  Service: Cardiovascular;  Laterality: N/A;   PROSTATE SURGERY  2005   Allergies  Allergen Reactions   Lisinopril Swelling    angioedema   Prior to Admission medications   Medication Sig Start Date End Date Taking? Authorizing Provider   amLODipine (NORVASC) 10 MG tablet TAKE 1 TABLET BY MOUTH DAILY 09/24/22  Yes Shade Flood, MD  aspirin EC 81 MG tablet Take 81 mg by mouth daily as needed for mild pain.    Yes [provider]  atorvastatin (LIPITOR) 40 MG tablet Take 1 tablet (40 mg total) by mouth daily. 01/31/22  Yes Shade Flood, MD  Cholecalciferol (VITAMIN D3) 50 MCG (2000 UT) TABS Take by mouth.   Yes [provider]  diphenhydrAMINE (BENADRYL) 25 MG tablet Take 25 mg by mouth every 6 (six) hours as needed.   Yes [provider]  fexofenadine (ALLEGRA) 180 MG tablet Take 1 tablet (180 mg total) by mouth daily. 08/10/18  Yes Bobbitt, Heywood Iles, MD  fluticasone [facility]) 50 MCG/ACT nasal spray Place 2 sprays into both nostrils daily. 03/18/19  Yes Lezlie Lye, Meda Coffee, MD  gabapentin (NEURONTIN) 300 MG capsule TAKE 3 CAPSULES BY MOUTH TWICE  DAILY 08/09/22  Yes Shade Flood, MD  glipiZIDE (GLUCOTROL) 5 MG tablet TAKE 1 TABLET BY MOUTH TWICE  DAILY BEFORE A MEAL 10/24/21  Yes Shade Flood, MD  glipiZIDE (GLUCOTROL) 5 MG tablet Take 0.5 tablets (2.5 mg total) by mouth 2 (two) times daily before a meal. 10/24/21  Yes Shade Flood, MD  HYDROcodone-acetaminophen (NORCO/VICODIN) 5-325 MG tablet Take 1 tablet by mouth every 6 (six) hours as needed for moderate pain. 07/19/22  Yes Shade Flood, MD  metFORMIN (GLUCOPHAGE) 1000 MG tablet TAKE 1 TABLET BY MOUTH TWICE  DAILY WITH A MEAL 12/19/21  Yes Shade Flood, MD  montelukast (SINGULAIR) 10 MG tablet Take 1 tablet (10 mg total) by mouth daily. 07/17/22  Yes Shade Flood, MD  sildenafil (VIAGRA) 100 MG tablet Take 1 tablet (100 mg total) by mouth as needed 1hr prior to sexual activities 12/21/21  Yes   tiZANidine (ZANAFLEX) 2 MG tablet TAKE 1 TABLET BY MOUTH EVERY 6  HOURS AS NEEDED FOR MUSCLE  SPASM(S) 02/07/22  Yes Shade Flood, MD  triamcinolone cream (KENALOG) 0.1 % APPLY TOPICALLY TWICE DAILY AS  NEEDED 02/07/22  Yes  Shade Flood, MD   Social History   Socioeconomic History   Marital status: Widowed    Spouse name: Not on file   Number of children: 2   Years of education: 9   Highest education level: 12th grade  Occupational History   Occupation: retired    Comment: Ecologist  Tobacco Use   Smoking status: Every Day    Types: Cigarettes   Smokeless tobacco: Never  Vaping Use   Vaping status: Never Used  Substance and Sexual Activity   Alcohol use: No   Drug use: No   Sexual activity: Yes  Other Topics Concern   Not on file  Social History Narrative   Patient is single and his daughter lives with him.   Patient has two children.   Patient has a 9th grade education.   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   Patient drinks one to two cups of coffee daily.   Patient is right-handed.         Per [facility] New Patient Packet:   Diet:Left blank      Caffeine:Yes      Married, if yes what year: Widowed      Do you live in a house, apartment, assisted living, condo, trailer, ect: House, 3 persons      Is it one or more stories: One      Pets: No      Current/Past profession: Recruitment consultant      Highest level or education completed:       Exercise:         No         Type and how often:          Living Will: No   DNR: No   POA/HPOA: No      Functional Status:   Do you have difficulty bathing or dressing yourself? No   Do you have difficulty preparing food or eating? No   Do you have difficulty managing your medications? No   Do you have difficulty managing your finances? No   Do you have difficulty affording your medications? No   Social Determinants of Health   Financial Resource Strain: Low Risk  (11/14/2022)   Overall Financial Resource Strain (CARDIA)    Difficulty of Paying Living Expenses: Not hard at all  Food Insecurity: No Food Insecurity (11/14/2022)   Hunger Vital Sign    Worried About Running Out of Food in the Last Year: Never true    Ran Out of Food in  the Last Year: Never true  Transportation Needs: No Transportation Needs (11/14/2022)   PRAPARE - Administrator, Civil Service (Medical): No    Lack of Transportation (Non-Medical): No  Physical Activity: Unknown (11/14/2022)   Exercise Vital Sign    Days of Exercise per Week: 0 days    Minutes of Exercise per Session: Not on file  Stress: No Stress Concern Present (11/14/2022)   [employer] of Occupational Health - Occupational Stress Questionnaire    Feeling of Stress : Not at all  Social Connections: Moderately Isolated (11/14/2022)   Social Connection and Isolation Panel [NHANES]    Frequency of Communication with Friends and Family: More than three times a week    Frequency of Social Gatherings with Friends and Family: More than three times a week    Attends Religious Services: 1 to 4 times per year    Active Member of [employer] or Organizations: No    Attends Banker Meetings: Never    Marital Status: Widowed  Intimate Partner Violence: Not At Risk (08/08/2022)   Humiliation, Afraid, Rape, and Kick questionnaire    Fear of Current or Ex-Partner: No    Emotionally Abused: No    Physically Abused: No    Sexually Abused: No    Review of Systems  Constitutional:  Negative for fatigue and unexpected weight change.  Eyes:  Negative for visual disturbance.  Respiratory:  Negative for cough, chest tightness and shortness of breath.   Cardiovascular:  Negative for chest pain, palpitations and leg swelling.  Gastrointestinal:  Negative for abdominal pain and blood in stool.  Neurological:  Negative for dizziness, light-headedness and headaches.     Objective:   Vitals:   11/15/22 0948  BP: 124/68  Pulse: 64  Temp: 97.8 F (36.6 C)  TempSrc:  Temporal  SpO2: 98%  Weight: 130 lb (59 kg)  Height: 5\' 5"  (1.651 m)     Physical Exam Vitals reviewed.  Constitutional:      Appearance: He is well-developed.  HENT:     Head: Normocephalic and  atraumatic.  Neck:     Vascular: No carotid bruit or JVD.  Cardiovascular:     Rate and Rhythm: Normal rate and regular rhythm.     Heart sounds: Normal heart sounds. No murmur heard. Pulmonary:     Effort: Pulmonary effort is normal.     Breath sounds: Normal breath sounds. No rales.  Musculoskeletal:     Right lower leg: No edema.     Left lower leg: No edema.  Skin:    General: Skin is warm and dry.  Neurological:     Mental Status: He is alert and oriented to person, place, and time.  Psychiatric:        Mood and Affect: Mood normal.        Assessment & Plan:  Alexander [name] is a 77 y.o. male . Essential hypertension - Plan: Comprehensive metabolic panel  -Stable, atherosclerosis noted on prior CT, medical management, asymptomatic at this time.  No med changes, has refills.  Adjust plan accordingly based on labs.  Lumbar radiculopathy, chronic  -Followed by back specialist including use of gabapentin, okay to refill when needed.  Reports stable control without new side effects.  Type 2 diabetes mellitus with other circulatory complications (HCC) - Plan: glipiZIDE (GLUCOTROL) 5 MG tablet, metFORMIN (GLUCOPHAGE) 1000 MG tablet, Hemoglobin A1c  -Borderline control by history of hypoglycemia.  Cautiously adjusting regimen.  Tolerating twice daily dosing of glipizide full pill for now, metformin.  Check labs and adjust plan accordingly.  RTC precautions given, hypoglycemia symptoms and precautions discussed.  Hyperlipidemia LDL goal <70 - Plan: atorvastatin (LIPITOR) 40 MG tablet, Comprehensive metabolic panel, Lipid panel  -As above, coronary atherosclerosis noted on previous scans.  Asymptomatic.  Last LDL looked good.  Check updated labs and adjust plan accordingly, continue Lipitor 40 mg daily for now.  Meds ordered this encounter  Medications   atorvastatin (LIPITOR) 40 MG tablet    Sig: Take 1 tablet (40 mg total) by mouth daily.    Dispense:  90 tablet    Refill:  3     Requesting 1 year supply   glipiZIDE (GLUCOTROL) 5 MG tablet    Sig: Take 1 tablet (5 mg total) by mouth 2 (two) times daily before a meal.    Dispense:  200 tablet    Refill:  2    Please send a replace/new response with 100-Day Supply if appropriate to maximize member benefit. Requesting 1 year supply.   metFORMIN (GLUCOPHAGE) 1000 MG tablet    Sig: Take 1 tablet (1,000 mg total) by mouth 2 (two) times daily with a meal.    Dispense:  200 tablet    Refill:  2    Please send a replace/new response with 100-Day Supply if appropriate to maximize member benefit. Requesting 1 year supply.   There are no Patient Instructions on file for this visit.    Signed,   Meredith Staggers, MD [facility], [facility] 11/15/22 10:18 AM

## 2022-11-20 ENCOUNTER — Telehealth: Payer: Self-pay

## 2022-11-20 NOTE — Telephone Encounter (Signed)
-----   Message from Shade Flood sent at 11/19/2022  9:16 PM EDT ----- Results sent by MyChart, but please make sure he received these instructions.  Let me know if he is having any hypoglycemia symptoms at home.  Denied at his last visit.

## 2022-12-04 DIAGNOSIS — E1169 Type 2 diabetes mellitus with other specified complication: Secondary | ICD-10-CM | POA: Diagnosis not present

## 2022-12-04 DIAGNOSIS — I10 Essential (primary) hypertension: Secondary | ICD-10-CM | POA: Diagnosis not present

## 2022-12-04 DIAGNOSIS — F17200 Nicotine dependence, unspecified, uncomplicated: Secondary | ICD-10-CM | POA: Diagnosis not present

## 2022-12-04 DIAGNOSIS — E118 Type 2 diabetes mellitus with unspecified complications: Secondary | ICD-10-CM | POA: Diagnosis not present

## 2023-01-01 ENCOUNTER — Other Ambulatory Visit: Payer: Self-pay | Admitting: Family Medicine

## 2023-01-01 DIAGNOSIS — J310 Chronic rhinitis: Secondary | ICD-10-CM

## 2023-02-14 ENCOUNTER — Telehealth: Payer: Self-pay | Admitting: Family Medicine

## 2023-02-14 NOTE — Telephone Encounter (Signed)
Type of form received:[employer]   Additional comments: Chronic Condition Release of Information Form   Received ZO:[id number]- Front Desk   Form should be Faxed: [phone]  Is patient requesting call for pickup:N/A  Form placed: [facility] charge sheet.  Provider will determine charge. N/A  Individual made aware of 3-5 business day turn around No?  Just FYI no charge sheet attached

## 2023-02-14 NOTE — Telephone Encounter (Signed)
Form received from [place] [facility] for chronic condition notation.  Completed and placed in fax bin at back nurse station.

## 2023-02-14 NOTE — Telephone Encounter (Signed)
Faxed to the requested number, placed in the faxed folder at the front an closed

## 2023-02-17 DIAGNOSIS — M48062 Spinal stenosis, lumbar region with neurogenic claudication: Secondary | ICD-10-CM | POA: Diagnosis not present

## 2023-02-27 ENCOUNTER — Other Ambulatory Visit (HOSPITAL_COMMUNITY): Payer: Self-pay

## 2023-02-27 ENCOUNTER — Ambulatory Visit (INDEPENDENT_AMBULATORY_CARE_PROVIDER_SITE_OTHER)
Admission: RE | Admit: 2023-02-27 | Discharge: 2023-02-27 | Disposition: A | Discharge status: Home / Self Care | Payer: Medicare Other | Source: Ambulatory Visit | Attending: Family Medicine | Admitting: Family Medicine

## 2023-02-27 ENCOUNTER — Ambulatory Visit: Payer: Medicare Other | Admitting: Family Medicine

## 2023-02-27 VITALS — BP 126/68 | HR 76 | Temp 99.2°F | Resp 19 | Ht 65.0 in | Wt 132.0 lb

## 2023-02-27 DIAGNOSIS — R5383 Other fatigue: Secondary | ICD-10-CM

## 2023-02-27 DIAGNOSIS — I700 Atherosclerosis of aorta: Secondary | ICD-10-CM | POA: Diagnosis not present

## 2023-02-27 DIAGNOSIS — J439 Emphysema, unspecified: Secondary | ICD-10-CM

## 2023-02-27 DIAGNOSIS — R052 Subacute cough: Secondary | ICD-10-CM

## 2023-02-27 DIAGNOSIS — R059 Cough, unspecified: Secondary | ICD-10-CM | POA: Diagnosis not present

## 2023-02-27 MED ORDER — PREDNISONE 20 MG PO TABS
40.0000 mg | ORAL_TABLET | Freq: Every day | ORAL | 0 refills | Status: DC
Start: 2023-02-27 — End: 2023-05-07
  Filled 2023-02-27: qty 10, 5d supply, fill #0

## 2023-02-27 MED ORDER — BENZONATATE 100 MG PO CAPS
100.0000 mg | ORAL_CAPSULE | Freq: Three times a day (TID) | ORAL | 0 refills | Status: DC | PRN
Start: 2023-02-27 — End: 2023-05-07
  Filled 2023-02-27: qty 20, 7d supply, fill #0

## 2023-02-27 NOTE — Progress Notes (Signed)
 Subjective:  Patient ID: Alexander [name], male    DOB: 1945-04-03  Age: 78 y.o. MRN: [id number]  CC:  Chief Complaint  Patient presents with   Cough    C/o cough. Denies any other sxs. Cough started 2-3 wks ago.     HPI Alexander [name] presents for   Cough Past 2 to 3 weeks. Cut some concrete with break saw. Wore a mask at that time.  Exposure to influenza about 2 weeks ago - nephew.  Slight runny nose. HA and stomach ache - homeopathic flu treatment. Fever during that time - treated with tylenol . No recent fever Here with granddaughter providing additional hx.  Persistent cough during this time.  Possible slight wheeze. No inhaler. Has used flonase  a few times.  Some fatigue with initial illness - some residual sx's.  Some clear phlegm at times.  No discolored phlegm. Drinking fluids.  No chest pains.  Emphysema on prior CT. No Current treatments.  Office visit with pulmonary in September 2020, COPD with extensive eczematous changes.  No prior PFT.  Asymptomatic at that time.   Separate concern at end of visit, rash or abnormality on one of his fingers of his R hand.  No acute changes, plans to discuss at follow-up visit next week.   History Patient Active Problem List   Diagnosis Date Noted   Right carpal tunnel syndrome 06/08/2019   Malignant neoplasm of prostate (HCC) 10/27/2018   Allergic reaction 08/10/2018   Angioedema 08/10/2018   Peripheral arterial occlusive disease (HCC) 10/28/2016   Hyperlipidemia LDL goal <70 05/01/2015   Tobacco use disorder 12/09/2013   Coronary artery disease involving native coronary artery of native heart without angina pectoris 12/09/2013   Erectile dysfunction due to diseases classified elsewhere 12/09/2013   Type 2 diabetes mellitus with other circulatory complications (HCC) 12/09/2013   Left lumbar radiculopathy 09/30/2012   Chronic rhinitis 04/08/2011   Hypertension    HTN (hypertension), benign 03/09/2011   History of  prostate cancer 03/09/2011   Past Medical History:  Diagnosis Date   Angio-edema    CAD (coronary artery disease)    Per [facility] New Patient Packet    Carpal tunnel syndrome    Per [facility] New Patient Packet    Diabetes mellitus without complication (HCC)    Hypertension    Neuromuscular disorder (HCC)    Peripheral arterial disease (HCC) 11/14/2016   non-occlusive, asymptomatic, seen by vascular surgery Dr. Harvey who rec repeated ABIs annually   Prostate cancer [facility])    Past Surgical History:  Procedure Laterality Date   CATARACT EXTRACTION Right 08/23/2020   COLON SURGERY     locked bowel fixed   COLONOSCOPY  2019   HERNIA REPAIR     LEFT HEART CATHETERIZATION WITH CORONARY ANGIOGRAM N/A 09/15/2013   Procedure: LEFT HEART CATHETERIZATION WITH CORONARY ANGIOGRAM;  Surgeon: Peter M Jordan, MD;  Location: [facility] CATH LAB;  Service: Cardiovascular;  Laterality: N/A;   PROSTATE SURGERY  2005   Allergies  Allergen Reactions   Lisinopril  Swelling    angioedema   Prior to Admission medications   Medication Sig Start Date End Date Taking? Authorizing Provider  amLODipine  (NORVASC ) 10 MG tablet TAKE 1 TABLET BY MOUTH DAILY 09/24/22  Yes Levora Reyes SAUNDERS, MD  aspirin  EC 81 MG tablet Take 81 mg by mouth daily as needed for mild pain.    Yes [provider]  atorvastatin  (LIPITOR) 40 MG tablet Take 1 tablet (40 mg total) by mouth daily.  11/15/22  Yes Levora Reyes SAUNDERS, MD  Cholecalciferol (VITAMIN D3) 50 MCG (2000 UT) TABS Take by mouth.   Yes [provider]  diphenhydrAMINE  (BENADRYL ) 25 MG tablet Take 25 mg by mouth every 6 (six) hours as needed.   Yes [provider]  fexofenadine  (ALLEGRA ) 180 MG tablet Take 1 tablet (180 mg total) by mouth daily. 08/10/18  Yes Bobbitt, Elgin Pepper, MD  fluticasone  (FLONASE ) 50 MCG/ACT nasal spray Place 2 sprays into both nostrils daily. 03/18/19  Yes Melonie Colonel, Mikel HERO, MD  gabapentin  (NEURONTIN ) 300 MG capsule TAKE 3 CAPSULES  BY MOUTH TWICE  DAILY 08/09/22  Yes Levora Reyes SAUNDERS, MD  glipiZIDE  (GLUCOTROL ) 5 MG tablet Take 1 tablet (5 mg total) by mouth 2 (two) times daily before a meal. 11/15/22  Yes Levora Reyes SAUNDERS, MD  HYDROcodone -acetaminophen  (NORCO/VICODIN) 5-325 MG tablet Take 1 tablet by mouth every 6 (six) hours as needed for moderate pain. 07/19/22  Yes Levora Reyes SAUNDERS, MD  metFORMIN  (GLUCOPHAGE ) 1000 MG tablet Take 1 tablet (1,000 mg total) by mouth 2 (two) times daily with a meal. 11/15/22  Yes Levora Reyes SAUNDERS, MD  sildenafil  (VIAGRA ) 100 MG tablet Take 1 tablet (100 mg total) by mouth as needed 1hr prior to sexual activities 12/21/21  Yes   tiZANidine  (ZANAFLEX ) 2 MG tablet TAKE 1 TABLET BY MOUTH EVERY 6  HOURS AS NEEDED FOR MUSCLE  SPASM(S) 02/07/22  Yes Levora Reyes SAUNDERS, MD  triamcinolone  cream (KENALOG ) 0.1 % APPLY TOPICALLY TWICE DAILY AS  NEEDED 02/07/22  Yes Levora Reyes SAUNDERS, MD   Social History   Socioeconomic History   Marital status: Widowed    Spouse name: Not on file   Number of children: 2   Years of education: 9   Highest education level: 12th grade  Occupational History   Occupation: retired    Comment: ecologist  Tobacco Use   Smoking status: Every Day    Types: Cigarettes   Smokeless tobacco: Never  Vaping Use   Vaping status: Never Used  Substance and Sexual Activity   Alcohol use: No   Drug use: No   Sexual activity: Yes  Other Topics Concern   Not on file  Social History Narrative   Patient is single and his daughter lives with him.   Patient has two children.   Patient has a 9th grade education.   Patient works in holiday representative (part-time).   Patient drinks one to two cups of coffee daily.   Patient is right-handed.         Per [facility] New Patient Packet:   Diet:Left blank      Caffeine:Yes      Married, if yes what year: Widowed      Do you live in a house, apartment, assisted living, condo, trailer, ect: House, 3 persons      Is it one or more stories:  One      Pets: No      Current/Past profession: Recruitment Consultant      Highest level or education completed:       Exercise:         No         Type and how often:          Living Will: No   DNR: No   POA/HPOA: No      Functional Status:   Do you have difficulty bathing or dressing yourself? No   Do you have difficulty preparing food or eating?  No   Do you have difficulty managing your medications? No   Do you have difficulty managing your finances? No   Do you have difficulty affording your medications? No   Social Drivers of Corporate Investment Banker Strain: Low Risk  (02/27/2023)   Overall Financial Resource Strain (CARDIA)    Difficulty of Paying Living Expenses: Not hard at all  Food Insecurity: No Food Insecurity (02/27/2023)   Hunger Vital Sign    Worried About Running Out of Food in the Last Year: Never true    Ran Out of Food in the Last Year: Never true  Transportation Needs: No Transportation Needs (02/27/2023)   PRAPARE - Administrator, Civil Service (Medical): No    Lack of Transportation (Non-Medical): No  Physical Activity: Unknown (02/27/2023)   Exercise Vital Sign    Days of Exercise per Week: Patient declined    Minutes of Exercise per Session: Not on file  Stress: No Stress Concern Present (02/27/2023)   [employer] of Occupational Health - Occupational Stress Questionnaire    Feeling of Stress : Not at all  Social Connections: Moderately Isolated (02/27/2023)   Social Connection and Isolation Panel [NHANES]    Frequency of Communication with Friends and Family: More than three times a week    Frequency of Social Gatherings with Friends and Family: More than three times a week    Attends Religious Services: 1 to 4 times per year    Active Member of [employer] or Organizations: No    Attends Banker Meetings: Never    Marital Status: Widowed  Intimate Partner Violence: Not At Risk (08/08/2022)   Humiliation, Afraid, Rape, and Kick  questionnaire    Fear of Current or Ex-Partner: No    Emotionally Abused: No    Physically Abused: No    Sexually Abused: No    Review of Systems   Objective:   Vitals:   02/27/23 1355  BP: 126/68  Pulse: 76  Resp: 19  Temp: 99.2 F (37.3 C)  TempSrc: Temporal  SpO2: 92%  Weight: 132 lb (59.9 kg)  Height: 5' 5 (1.651 m)     Physical Exam Vitals reviewed.  Constitutional:      Appearance: He is well-developed.  HENT:     Head: Normocephalic and atraumatic.     Right Ear: Tympanic membrane, ear canal and external ear normal.     Left Ear: Tympanic membrane, ear canal and external ear normal.     Nose: No rhinorrhea.     Mouth/Throat:     Pharynx: No oropharyngeal exudate or posterior oropharyngeal erythema.  Eyes:     Conjunctiva/sclera: Conjunctivae normal.     Pupils: Pupils are equal, round, and reactive to light.  Neck:     Vascular: No carotid bruit or JVD.  Cardiovascular:     Rate and Rhythm: Normal rate and regular rhythm.     Heart sounds: Normal heart sounds. No murmur heard. Pulmonary:     Effort: Pulmonary effort is normal.     Breath sounds: Rhonchi (Few scattered coarse breath sounds right and left side, primarily at bases.  No active wheeze, speaking full sentences.  No stridor.  No distress.) and rales present.  Abdominal:     Palpations: Abdomen is soft.     Tenderness: There is no abdominal tenderness.  Musculoskeletal:     Cervical back: Neck supple.     Right lower leg: No edema.     Left lower  leg: No edema.  Lymphadenopathy:     Cervical: No cervical adenopathy.  Skin:    General: Skin is warm and dry.     Findings: No rash.  Neurological:     Mental Status: He is alert and oriented to person, place, and time.  Psychiatric:        Mood and Affect: Mood normal.        Behavior: Behavior normal.        Assessment & Plan:  Alexander [name] is a 78 y.o. male . Pulmonary emphysema, unspecified emphysema type (HCC) - Plan: CBC,  Basic metabolic panel, DG Chest 2 View, predniSONE  (DELTASONE ) 20 MG tablet  Subacute cough - Plan: CBC, Basic metabolic panel, DG Chest 2 View, predniSONE  (DELTASONE ) 20 MG tablet, benzonatate  (TESSALON ) 100 MG capsule  Fatigue, unspecified type - Plan: CBC, Basic metabolic panel, DG Chest 2 View  Persistent cough, could be combination of pneumonitis/irritation from cement dust, postviral cough, or COPD flare.  No wheeze on exam, few coarse breath sounds.  Will check chest x-ray.  CBC, BMP for fatigue.  O2 sat 92%, not dyspneic on exam.  Repeat next week with ambulatory pulse ox possibly at that time.  Considering flying on the 18th of this month, would like to see that level higher prior to clearing to fly.  Check chest x-ray today with labs as above, RTC precautions.  Tessalon  Perles for now, prednisone  with potential side effects discussed.  Meds ordered this encounter  Medications   predniSONE  (DELTASONE ) 20 MG tablet    Sig: Take 2 tablets (40 mg total) by mouth daily with breakfast.    Dispense:  10 tablet    Refill:  0   benzonatate  (TESSALON ) 100 MG capsule    Sig: Take 1 capsule (100 mg total) by mouth 3 (three) times daily as needed for cough.    Dispense:  20 capsule    Refill:  0   Patient Instructions  Cough could be a residual cough from the irritation from cement dust, or residual from a possible viral infection a few weeks ago.  You have been seen by pulmonary for COPD in the past but I do not see where a pulmonary function test was performed.  I do want you to have a chest x-ray and if any concerns on that x-ray, I can call in additional medication.  Tessalon  Perles are okay for now for cough.  Short course of prednisone  may help cough if this is from COPD.  Recheck in 1 week.  I am also checking some blood work for fatigue but that may also be due to your breathing.   [facility] or xray: Walk in 8:30-4:30 during weekdays, no appointment needed [address]   [place], [place] [address]     Signed,   Reyes Pines, MD [facility], [facility] 02/27/23 2:50 PM

## 2023-02-27 NOTE — Patient Instructions (Addendum)
 Cough could be a residual cough from the irritation from cement dust, or residual from a possible viral infection a few weeks ago.  You have been seen by pulmonary for COPD in the past but I do not see where a pulmonary function test was performed.  I do want you to have a chest x-ray and if any concerns on that x-ray, I can call in additional medication.  Tessalon  Perles are okay for now for cough.  Short course of prednisone  may help cough if this is from COPD.  Recheck in 1 week.  I am also checking some blood work for fatigue but that may also be due to your breathing.   [facility] or xray: Walk in 8:30-4:30 during weekdays, no appointment needed [address]  [place], [place] [address]

## 2023-02-28 ENCOUNTER — Telehealth: Payer: Self-pay

## 2023-02-28 LAB — BASIC METABOLIC PANEL
BUN: 16 mg/dL (ref 6–23)
CO2: 25 meq/L (ref 19–32)
Calcium: 9.3 mg/dL (ref 8.4–10.5)
Chloride: 104 meq/L (ref 96–112)
Creatinine, Ser: 1.15 mg/dL (ref 0.40–1.50)
GFR: 61.63 mL/min (ref >60.00)
Glucose, Bld: 101 mg/dL — ABNORMAL HIGH (ref 70–99)
Potassium: 4.6 meq/L (ref 3.5–5.1)
Sodium: 140 meq/L (ref 135–145)

## 2023-02-28 LAB — CBC
HCT: 40.7 % (ref 39.0–52.0)
Hemoglobin: 13.4 g/dL (ref 13.0–17.0)
MCHC: 32.8 g/dL (ref 30.0–36.0)
MCV: 93.3 fL (ref 78.0–100.0)
Platelets: 306 10*3/uL (ref 150.0–400.0)
RBC: 4.36 Mil/uL (ref 4.22–5.81)
RDW: 14 % (ref 11.5–15.5)
WBC: 6.4 10*3/uL (ref 4.0–10.5)

## 2023-02-28 NOTE — Telephone Encounter (Signed)
 If he is having low oxygen below 90% he should be seen in the ER to assess that level and make sure it is accurate.  If he does need to be on oxygen, they can then order it or determine if he needs to be hospitalized.

## 2023-02-28 NOTE — Telephone Encounter (Signed)
 Copied from CRM [id number]. Topic: Clinical - Medical Advice >> Feb 28, 2023 11:13 AM Drema MATSU wrote: Reason for CRM: Patient wants to let the doctor know that patient pulse today has been hoovering around 88-90 and she wants to know if she can purchase supplemental oxygen  if need be instead of going to the ER. Patient doesn't have any new symptoms. He has a pulmonary appointment coming up on Wednesday.

## 2023-03-02 ENCOUNTER — Encounter: Payer: Self-pay | Admitting: Family Medicine

## 2023-03-03 ENCOUNTER — Ambulatory Visit: Payer: Medicare Other | Admitting: Family Medicine

## 2023-03-03 NOTE — Telephone Encounter (Signed)
 Patient daughter who called in answered, received verbal okay to speak to her from the patient, relayed the message about ER assessment and advised discussing this recent trend with Pulm on Wednesday she voiced understanding and will follow up with them

## 2023-03-05 ENCOUNTER — Other Ambulatory Visit (HOSPITAL_COMMUNITY): Payer: Self-pay

## 2023-03-05 ENCOUNTER — Encounter: Payer: Self-pay | Admitting: Student in an Organized Health Care Education/Training Program

## 2023-03-05 ENCOUNTER — Ambulatory Visit: Payer: Medicare Other | Admitting: Student in an Organized Health Care Education/Training Program

## 2023-03-05 VITALS — BP 128/80 | HR 74 | Temp 97.1°F | Ht 65.0 in | Wt 133.6 lb

## 2023-03-05 DIAGNOSIS — J432 Centrilobular emphysema: Secondary | ICD-10-CM

## 2023-03-05 DIAGNOSIS — J9611 Chronic respiratory failure with hypoxia: Secondary | ICD-10-CM

## 2023-03-05 DIAGNOSIS — F17200 Nicotine dependence, unspecified, uncomplicated: Secondary | ICD-10-CM

## 2023-03-05 MED ORDER — ANORO ELLIPTA 62.5-25 MCG/ACT IN AEPB
1.0000 | INHALATION_SPRAY | Freq: Every day | RESPIRATORY_TRACT | 11 refills | Status: DC
Start: 2023-03-05 — End: 2023-06-06
  Filled 2023-03-05: qty 60, 30d supply, fill #0

## 2023-03-05 MED ORDER — NICOTINE POLACRILEX 4 MG MT LOZG
4.0000 mg | LOZENGE | OROMUCOSAL | 6 refills | Status: AC | PRN
Start: 2023-03-05 — End: 2023-06-03
  Filled 2023-03-05: qty 72, 6d supply, fill #0

## 2023-03-05 NOTE — Progress Notes (Unsigned)
Synopsis: Referred in *** by Shade Flood, MD  Assessment & Plan:   1. Centrilobular emphysema (HCC) (Primary)  History of significant emphysema noted on low-dose CT for lung cancer screening.  While he is denying exertional dyspnea, it is possible that he is limiting his activities secondary to symptoms.  He does have a cough that is productive of whitish sputum.  Discussed that the diagnosis of COPD requires spirometry and we will order PFTs.  Also discussed the role of LAMA/LABA therapy to decrease the risk of exacerbations as well as prevent further decline in lung function.  - Pulmonary Function Test [facility] Only; Future - umeclidinium-vilanterol (ANORO ELLIPTA) 62.5-25 MCG/ACT AEPB; Inhale 1 puff into the lungs daily.  Dispense: 60 each; Refill: 11 - AMB REFERRAL FOR DME  2. Tobacco use disorder Counseled extensively about the importance of smoking cessation.  He is not willing to try Chantix or patches but is willing to use nicotine lozenges. - nicotine polacrilex (NICOTINE MINI) 4 MG lozenge; Dissolve 1 lozenge (4 mg total) by mouth every 2 (two) hours as needed for smoking cessation.  Dispense: 72 lozenge; Refill: 6  3. Chronic hypoxic respiratory failure (HCC)  While he was saturating well on room air, his oxygen saturation did drop to 82% after walking in the hallway.  Patient was somewhat symptomatic at that time.  I am concerned that he has chronic hypoxia secondary to his emphysema but this could have also been exacerbated by his recent bouts with an upper respiratory tract infection.  He does have a trip coming up and I explained that he is at an increased risk for hypoxia secondary to the cabin being pressurized at 10,000 feet.  We will prescribe him oxygen to be used with exertion and nocturnally.  Also counseled them to contact the airline he will be flying with to provide him oxygen during his flight.   Return in about 3 months (around 06/02/2023).  I spent *** minutes  caring for this patient today, including {EM billing:28027}  Raechel Chute, MD [name] 03/05/2023 4:47 PM    End of visit medications:  Meds ordered this encounter  Medications   umeclidinium-vilanterol (ANORO ELLIPTA) 62.5-25 MCG/ACT AEPB    Sig: Inhale 1 puff into the lungs daily.    Dispense:  60 each    Refill:  11   nicotine polacrilex (NICOTINE MINI) 4 MG lozenge    Sig: Dissolve 1 lozenge (4 mg total) by mouth every 2 (two) hours as needed for smoking cessation.    Dispense:  72 lozenge    Refill:  6     Current Outpatient Medications:    amLODipine (NORVASC) 10 MG tablet, TAKE 1 TABLET BY MOUTH DAILY, Disp: 100 tablet, Rfl: 2   aspirin EC 81 MG tablet, Take 81 mg by mouth daily as needed for mild pain. , Disp: , Rfl:    atorvastatin (LIPITOR) 40 MG tablet, Take 1 tablet (40 mg total) by mouth daily., Disp: 90 tablet, Rfl: 3   benzonatate (TESSALON) 100 MG capsule, Take 1 capsule (100 mg total) by mouth 3 (three) times daily as needed for cough., Disp: 20 capsule, Rfl: 0   Cholecalciferol (VITAMIN D3) 50 MCG (2000 UT) TABS, Take by mouth., Disp: , Rfl:    diphenhydrAMINE (BENADRYL) 25 MG tablet, Take 25 mg by mouth every 6 (six) hours as needed., Disp: , Rfl:    fexofenadine (ALLEGRA) 180 MG tablet, Take 1 tablet (180 mg total) by mouth daily.,  Disp: 30 tablet, Rfl: 5   fluticasone (FLONASE) 50 MCG/ACT nasal spray, Place 2 sprays into both nostrils daily., Disp: 16 g, Rfl: 6   gabapentin (NEURONTIN) 300 MG capsule, TAKE 3 CAPSULES BY MOUTH TWICE  DAILY, Disp: 600 capsule, Rfl: 2   glipiZIDE (GLUCOTROL) 5 MG tablet, Take 1 tablet (5 mg total) by mouth 2 (two) times daily before a meal., Disp: 200 tablet, Rfl: 2   HYDROcodone-acetaminophen (NORCO/VICODIN) 5-325 MG tablet, Take 1 tablet by mouth every 6 (six) hours as needed for moderate pain., Disp: 20 tablet, Rfl: 0   metFORMIN (GLUCOPHAGE) 1000 MG tablet, Take 1 tablet (1,000 mg total) by mouth 2 (two)  times daily with a meal., Disp: 200 tablet, Rfl: 2   nicotine polacrilex (NICOTINE MINI) 4 MG lozenge, Dissolve 1 lozenge (4 mg total) by mouth every 2 (two) hours as needed for smoking cessation., Disp: 72 lozenge, Rfl: 6   predniSONE (DELTASONE) 20 MG tablet, Take 2 tablets (40 mg total) by mouth daily with breakfast., Disp: 10 tablet, Rfl: 0   sildenafil (VIAGRA) 100 MG tablet, Take 1 tablet (100 mg total) by mouth as needed 1hr prior to sexual activities, Disp: 30 tablet, Rfl: 3   tiZANidine (ZANAFLEX) 2 MG tablet, TAKE 1 TABLET BY MOUTH EVERY 6  HOURS AS NEEDED FOR MUSCLE  SPASM(S), Disp: 15 tablet, Rfl: 0   triamcinolone cream (KENALOG) 0.1 %, APPLY TOPICALLY TWICE DAILY AS  NEEDED, Disp: 45 g, Rfl: 0   umeclidinium-vilanterol (ANORO ELLIPTA) 62.5-25 MCG/ACT AEPB, Inhale 1 puff into the lungs daily., Disp: 60 each, Rfl: 11   Subjective:   PATIENT ID: Alexander [name] GENDER: male DOB: 01/03/1946, MRN: [id number]  Chief Complaint  Patient presents with   Consult    No SOB or wheezing. Cough with clear sputum.     HPI  Patient is a pleasant 78 year old male with a past medical history of emphysema and cigarette smoking presenting to clinic for the evaluation of shortness of breath and cough.  Patient reports minimal symptoms and feels he is in his usual state of health.  He is accompanied by his daughter who reports symptoms that include cough, shortness of breath, and wheezing.  Patient feels that his symptoms of cough started around 3 weeks ago.  He is coughing up whitish colored sputum occasionally.  He does not report any exertional dyspnea or shortness of breath at rest.  He denies any chest pain, chest tightness, wheezing, hemoptysis, night sweats, fevers, chills, or weight loss.  Daughter reports that around 3 weeks ago he was exposed to his great grandchild who was sick.  Patient developed symptoms of an upper respiratory tract infection and daughter reports he had a fever of up to  19 F.  Patient was seen by his primary [name] physician earlier this month secondary to his symptoms.  During said visit, he was noted to have rhonchi on exam with a persistent cough.  He was prescribed a short course of prednisone and an x-ray was ordered.  His oxygen saturation was 92% at rest.  Patient and daughter report plans for flight to [place], [place] coming up later this month.  He was previously seen by my colleague Dr. Brunetta Genera in 2020 where PFTs were ordered.  Chantix and nicotine lozenges were attempted, patient felt very ill with the Chantix and discontinued it.  He is enrolled in our lung cancer screening program with the most recent CT being in August 2024.  He has had a history of  prostate cancer status post prostatectomy.  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  He did have exposure to dust on his job recently while he was attempting to cut the cement.  He feels that this somewhat contributed to his symptoms.  He smokes cigarettes, having started at the age of 67.  He smoked 1 pack a day for most of his life.  He has around 60 pack years of smoking history.  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  No Financial planner.  He is originally from [place] and has lived here most of his life.  Ancillary information including prior medications, full medical/surgical/family/social histories, and PFTs (when available) are listed below and have been reviewed.   ROS   Objective:   Vitals:   03/05/23 1538  BP: 128/80  Pulse: 74  Temp: (!) 97.1 F (36.2 C)  SpO2: 93%  Weight: 133 lb 9.6 oz (60.6 kg)  Height: 5\' 5"  (1.651 m)   93% on *** LPM *** RA BMI Readings from Last 3 Encounters:  03/05/23 22.23 kg/m  02/27/23 21.97 kg/m  11/15/22 21.63 kg/m   Wt Readings from Last 3 Encounters:  03/05/23 133 lb 9.6 oz (60.6 kg)  02/27/23 132 lb (59.9 kg)  11/15/22 130 lb (59 kg)    Physical Exam    Ancillary Information    Past Medical History:   Diagnosis Date   Angio-edema    CAD (coronary artery disease)    Per [facility] New Patient Packet    Carpal tunnel syndrome    Per [facility] New Patient Packet    Diabetes mellitus without complication (HCC)    Hypertension    Neuromuscular disorder (HCC)    Peripheral arterial disease (HCC) 11/14/2016   non-occlusive, asymptomatic, seen by vascular surgery Dr. Darrick Penna who rec repeated ABIs annually   Prostate cancer [facility])      Family History  Problem Relation Age of Onset   Diabetes Mother    Depression Father    Hypertension Father    Diabetes Father    Thyroid disease Father    Hypertension Sister    Diabetes Sister    Congestive Heart Failure Sister    Kidney failure Sister    Diabetes Sister    Lung cancer Brother    Drug abuse Brother    Breast cancer Neg Hx    Prostate cancer Neg Hx    Colon cancer Neg Hx      Past Surgical History:  Procedure Laterality Date   CATARACT EXTRACTION Right 08/23/2020   COLON SURGERY     "locked bowel" fixed   COLONOSCOPY  2019   HERNIA REPAIR     LEFT HEART CATHETERIZATION WITH CORONARY ANGIOGRAM N/A 09/15/2013   Procedure: LEFT HEART CATHETERIZATION WITH CORONARY ANGIOGRAM;  Surgeon: Peter M Swaziland, MD;  Location: [facility] CATH LAB;  Service: Cardiovascular;  Laterality: N/A;   PROSTATE SURGERY  2005    Social History   Socioeconomic History   Marital status: Widowed    Spouse name: Not on file   Number of children: 2   Years of education: 9   Highest education level: 12th grade  Occupational History   Occupation: retired    Comment: Ecologist  Tobacco Use   Smoking status: Every Day    Types: Cigarettes   Smokeless tobacco: Never   Tobacco comments:    Smokes 0.75 PPD- khj 03/05/2023    Started smoking at 79 years old    Smoked 1 PDD  at his heaviest.  Vaping Use   Vaping status: Never Used  Substance and Sexual Activity   Alcohol use: No   Drug use: No   Sexual activity: Yes  Other Topics Concern   Not on file  Social  History Narrative   Patient is single and his daughter lives with him.   Patient has two children.   Patient has a 9th grade education.   Patient works in [employer] representative (part-time).   Patient drinks one to two cups of coffee daily.   Patient is right-handed.         Per [facility] New Patient Packet:   Diet:Left blank      Caffeine:Yes      Married, if yes what year: Widowed      Do you live in a house, apartment, assisted living, condo, trailer, ect: House, 3 persons      Is it one or more stories: One      Pets: No      Current/Past profession: Recruitment consultant      Highest level or education completed:       Exercise:         No         Type and how often:          Living Will: No   DNR: No   POA/HPOA: No      Functional Status:   Do you have difficulty bathing or dressing yourself? No   Do you have difficulty preparing food or eating? No   Do you have difficulty managing your medications? No   Do you have difficulty managing your finances? No   Do you have difficulty affording your medications? No   Social Drivers of Corporate investment banker Strain: Low Risk  (02/27/2023)   Overall Financial Resource Strain (CARDIA)    Difficulty of Paying Living Expenses: Not hard at all  Food Insecurity: No Food Insecurity (02/27/2023)   Hunger Vital Sign    Worried About Running Out of Food in the Last Year: Never true    Ran Out of Food in the Last Year: Never true  Transportation Needs: No Transportation Needs (02/27/2023)   PRAPARE - Administrator, Civil Service (Medical): No    Lack of Transportation (Non-Medical): No  Physical Activity: Unknown (02/27/2023)   Exercise Vital Sign    Days of Exercise per Week: Patient declined    Minutes of Exercise per Session: Not on file  Stress: No Stress Concern Present (02/27/2023)   [employer] of Occupational Health - Occupational Stress Questionnaire    Feeling of Stress : Not at all  Social Connections: Moderately Isolated  (02/27/2023)   Social Connection and Isolation Panel [NHANES]    Frequency of Communication with Friends and Family: More than three times a week    Frequency of Social Gatherings with Friends and Family: More than three times a week    Attends Religious Services: 1 to 4 times per year    Active Member of [employer] or Organizations: No    Attends Banker Meetings: Never    Marital Status: Widowed  Intimate Partner Violence: Not At Risk (08/08/2022)   Humiliation, Afraid, Rape, and Kick questionnaire    Fear of Current or Ex-Partner: No    Emotionally Abused: No    Physically Abused: No    Sexually Abused: No     Allergies  Allergen Reactions   Lisinopril Swelling    angioedema  CBC    Component Value Date/Time   WBC 6.4 02/27/2023 1447   RBC 4.36 02/27/2023 1447   HGB 13.4 02/27/2023 1447   HGB 13.5 08/10/2018 1007   HCT 40.7 02/27/2023 1447   HCT 41.2 08/10/2018 1007   PLT 306.0 02/27/2023 1447   PLT 230 08/10/2018 1007   MCV 93.3 02/27/2023 1447   MCV 90 08/10/2018 1007   MCH 30.3 05/10/2020 1043   MCHC 32.8 02/27/2023 1447   RDW 14.0 02/27/2023 1447   RDW 14.4 08/10/2018 1007   LYMPHSABS 1,061 05/10/2020 1043   LYMPHSABS 2.6 08/10/2018 1007   MONOABS 0.5 06/16/2009 1945   EOSABS 191 05/10/2020 1043   EOSABS 0.2 08/10/2018 1007   BASOSABS 58 05/10/2020 1043   BASOSABS 0.1 08/10/2018 1007    Pulmonary Functions Testing Results:     No data to display          Outpatient Medications Prior to Visit  Medication Sig Dispense Refill   amLODipine (NORVASC) 10 MG tablet TAKE 1 TABLET BY MOUTH DAILY 100 tablet 2   aspirin EC 81 MG tablet Take 81 mg by mouth daily as needed for mild pain.      atorvastatin (LIPITOR) 40 MG tablet Take 1 tablet (40 mg total) by mouth daily. 90 tablet 3   benzonatate (TESSALON) 100 MG capsule Take 1 capsule (100 mg total) by mouth 3 (three) times daily as needed for cough. 20 capsule 0   Cholecalciferol (VITAMIN D3) 50 MCG  (2000 UT) TABS Take by mouth.     diphenhydrAMINE (BENADRYL) 25 MG tablet Take 25 mg by mouth every 6 (six) hours as needed.     fexofenadine (ALLEGRA) 180 MG tablet Take 1 tablet (180 mg total) by mouth daily. 30 tablet 5   fluticasone (FLONASE) 50 MCG/ACT nasal spray Place 2 sprays into both nostrils daily. 16 g 6   gabapentin (NEURONTIN) 300 MG capsule TAKE 3 CAPSULES BY MOUTH TWICE  DAILY 600 capsule 2   glipiZIDE (GLUCOTROL) 5 MG tablet Take 1 tablet (5 mg total) by mouth 2 (two) times daily before a meal. 200 tablet 2   HYDROcodone-acetaminophen (NORCO/VICODIN) 5-325 MG tablet Take 1 tablet by mouth every 6 (six) hours as needed for moderate pain. 20 tablet 0   metFORMIN (GLUCOPHAGE) 1000 MG tablet Take 1 tablet (1,000 mg total) by mouth 2 (two) times daily with a meal. 200 tablet 2   predniSONE (DELTASONE) 20 MG tablet Take 2 tablets (40 mg total) by mouth daily with breakfast. 10 tablet 0   sildenafil (VIAGRA) 100 MG tablet Take 1 tablet (100 mg total) by mouth as needed 1hr prior to sexual activities 30 tablet 3   tiZANidine (ZANAFLEX) 2 MG tablet TAKE 1 TABLET BY MOUTH EVERY 6  HOURS AS NEEDED FOR MUSCLE  SPASM(S) 15 tablet 0   triamcinolone cream (KENALOG) 0.1 % APPLY TOPICALLY TWICE DAILY AS  NEEDED 45 g 0   No facility-administered medications prior to visit.

## 2023-03-06 ENCOUNTER — Encounter: Payer: Self-pay | Admitting: Family Medicine

## 2023-03-06 ENCOUNTER — Encounter: Payer: Self-pay | Admitting: Student in an Organized Health Care Education/Training Program

## 2023-03-06 ENCOUNTER — Other Ambulatory Visit (HOSPITAL_COMMUNITY): Payer: Self-pay

## 2023-03-06 ENCOUNTER — Ambulatory Visit: Payer: Medicare Other | Admitting: Family Medicine

## 2023-03-06 ENCOUNTER — Other Ambulatory Visit: Payer: Self-pay

## 2023-03-06 VITALS — BP 128/66 | HR 79 | Temp 98.6°F | Ht 65.0 in | Wt 132.2 lb

## 2023-03-06 DIAGNOSIS — L989 Disorder of the skin and subcutaneous tissue, unspecified: Secondary | ICD-10-CM | POA: Diagnosis not present

## 2023-03-06 DIAGNOSIS — J439 Emphysema, unspecified: Secondary | ICD-10-CM

## 2023-03-06 DIAGNOSIS — G894 Chronic pain syndrome: Secondary | ICD-10-CM | POA: Insufficient documentation

## 2023-03-06 DIAGNOSIS — Z860100 Personal history of colon polyps, unspecified: Secondary | ICD-10-CM | POA: Insufficient documentation

## 2023-03-06 NOTE — Patient Instructions (Addendum)
Start the new inhaler that was prescribed by pulmonary yesterday.  You should also be getting a call from the medical supplier for oxygen use at home.  In regards to the upcoming flight, I would recommend contacting the airline to determine what they need for you to use oxygen on the plane including if they are able to provide that oxygen and if any specific order or documentation needed from your pulmonologist.  Let me know if I can help further.  I will refer you to dermatology to evaluate the area on the finger.  Try using some lotion such as Eucerin or Aveeno for dry skin but I think this may be something more than just plain dry skin.  If you do not hear from them dermatologist in the next few weeks, let me know.  Recheck in 2 months and we can check labs at that time.  No other changes for now.  Take care!

## 2023-03-06 NOTE — Progress Notes (Signed)
Subjective:  Patient ID: Alexander [name], male    DOB: 1945-07-03  Age: 78 y.o. MRN: [id number]  CC:  Chief Complaint  Patient presents with   Cough    Pt notes doing a bit better, still has cough, went to pulm yesterday and they stated it is from COPD and tobacco use, provided with inhaler and O2 for PRN and daily at nighttime use    Hand Injury    Pt notes has a spot on his hand may have been a mole or may be an old cut, Rt hand little finger on the knuckle, notes no pain but concerned with the way it looks, no open wound     HPI [name] presents for   Cough, COPD Follow-up from February 6 visit.  COPD flare versus pneumonitis/irritation from cement dust at that time versus viral infection.  Chest x-ray without acute findings, chronic scarring at right middle lobe.  Started on [employer] and prednisone for possible COPD flare. Seen by pulmonary yesterday.Centrilobular emphysema.  Started on Anora Ellipta nicotine lozenges, and oxygen ordered for as needed use, nighttime use.  PFTs also ordered. Follow up in May.  Has not picked up inhaler yet.  Oxygen not yet delivered.   Cough has improved - still a little cough. No fever.  Flight pending on the 19th. They will be discussing use of oxygen on fight with airline.   Right hand lesion: Lesion on R 4th finger for a long time. Increasing in size past few years.  Has used mortar mix in past, using gloves now - not always prior.  No known injury.  No treatment. No derm.    History Patient Active Problem List   Diagnosis Date Noted   Chronic pain syndrome 03/06/2023   History of colonic polyps 03/06/2023   Right carpal tunnel syndrome 06/08/2019   Malignant neoplasm of prostate (HCC) 10/27/2018   Allergic reaction 08/10/2018   Angioedema 08/10/2018   Peripheral arterial occlusive disease (HCC) 10/28/2016   Hyperlipidemia LDL goal <70 05/01/2015   Tobacco use disorder 12/09/2013   Coronary artery disease involving  native coronary artery of native heart without angina pectoris 12/09/2013   Erectile dysfunction due to diseases classified elsewhere 12/09/2013   Type 2 diabetes mellitus with other circulatory complications (HCC) 12/09/2013   Elevated prostate specific antigen (PSA) 09/17/2013   Spinal stenosis of lumbar region 01/27/2013   Left lumbar radiculopathy 09/30/2012   Chronic rhinitis 04/08/2011   Hypertension    HTN (hypertension), benign 03/09/2011   History of prostate cancer 03/09/2011   Past Medical History:  Diagnosis Date   Angio-edema    CAD (coronary artery disease)    Per [facility] New Patient Packet    Carpal tunnel syndrome    Per [facility] New Patient Packet    Diabetes mellitus without complication (HCC)    Hypertension    Neuromuscular disorder (HCC)    Peripheral arterial disease (HCC) 11/14/2016   non-occlusive, asymptomatic, seen by vascular surgery Dr. Darrick Penna who rec repeated ABIs annually   Prostate cancer [facility])    Past Surgical History:  Procedure Laterality Date   CATARACT EXTRACTION Right 08/23/2020   COLON SURGERY     "locked bowel" fixed   COLONOSCOPY  2019   HERNIA REPAIR     LEFT HEART CATHETERIZATION WITH CORONARY ANGIOGRAM N/A 09/15/2013   Procedure: LEFT HEART CATHETERIZATION WITH CORONARY ANGIOGRAM;  Surgeon: Peter M Swaziland, MD;  Location: [facility] CATH LAB;  Service: Cardiovascular;  Laterality: N/A;  PROSTATE SURGERY  2005   Allergies  Allergen Reactions   Lisinopril Swelling    angioedema   Prior to Admission medications   Medication Sig Start Date End Date Taking? Authorizing Provider  amLODipine (NORVASC) 10 MG tablet TAKE 1 TABLET BY MOUTH DAILY 09/24/22  Yes Shade Flood, MD  aspirin EC 81 MG tablet Take 81 mg by mouth daily as needed for mild pain.    Yes [provider]  atorvastatin (LIPITOR) 40 MG tablet Take 1 tablet (40 mg total) by mouth daily. 11/15/22  Yes Shade Flood, MD  benzonatate (TESSALON) 100 MG capsule Take 1 capsule  (100 mg total) by mouth 3 (three) times daily as needed for cough. 02/27/23  Yes Shade Flood, MD  Cholecalciferol (VITAMIN D3) 50 MCG (2000 UT) TABS Take by mouth.   Yes [provider]  diphenhydrAMINE (BENADRYL) 25 MG tablet Take 25 mg by mouth every 6 (six) hours as needed.   Yes [provider]  fexofenadine (ALLEGRA) 180 MG tablet Take 1 tablet (180 mg total) by mouth daily. 08/10/18  Yes Bobbitt, Heywood Iles, MD  fluticasone [facility]) 50 MCG/ACT nasal spray Place 2 sprays into both nostrils daily. 03/18/19  Yes Lezlie Lye, Meda Coffee, MD  gabapentin (NEURONTIN) 300 MG capsule TAKE 3 CAPSULES BY MOUTH TWICE  DAILY 08/09/22  Yes Shade Flood, MD  glipiZIDE (GLUCOTROL) 5 MG tablet Take 1 tablet (5 mg total) by mouth 2 (two) times daily before a meal. 11/15/22  Yes Shade Flood, MD  HYDROcodone-acetaminophen (NORCO/VICODIN) 5-325 MG tablet Take 1 tablet by mouth every 6 (six) hours as needed for moderate pain. 07/19/22  Yes Shade Flood, MD  metFORMIN (GLUCOPHAGE) 1000 MG tablet Take 1 tablet (1,000 mg total) by mouth 2 (two) times daily with a meal. 11/15/22  Yes Shade Flood, MD  nicotine polacrilex (NICOTINE MINI) 4 MG lozenge Dissolve 1 lozenge (4 mg total) by mouth every 2 (two) hours as needed for smoking cessation. 03/05/23 06/03/23 Yes Dgayli, Lianne Bushy, MD  predniSONE (DELTASONE) 20 MG tablet Take 2 tablets (40 mg total) by mouth daily with breakfast. 02/27/23  Yes Shade Flood, MD  sildenafil (VIAGRA) 100 MG tablet Take 1 tablet (100 mg total) by mouth as needed 1hr prior to sexual activities 12/21/21  Yes   tiZANidine (ZANAFLEX) 2 MG tablet TAKE 1 TABLET BY MOUTH EVERY 6  HOURS AS NEEDED FOR MUSCLE  SPASM(S) 02/07/22  Yes Shade Flood, MD  triamcinolone cream (KENALOG) 0.1 % APPLY TOPICALLY TWICE DAILY AS  NEEDED 02/07/22  Yes Shade Flood, MD  umeclidinium-vilanterol (ANORO ELLIPTA) 62.5-25 MCG/ACT AEPB Inhale 1 puff into the lungs daily. 03/05/23   Yes Raechel Chute, MD   Social History   Socioeconomic History   Marital status: Widowed    Spouse name: Not on file   Number of children: 2   Years of education: 9   Highest education level: 12th grade  Occupational History   Occupation: retired    Comment: Ecologist  Tobacco Use   Smoking status: Every Day    Types: Cigarettes   Smokeless tobacco: Never   Tobacco comments:    Smokes 0.75 PPD- khj 03/05/2023    Started smoking at 78 years old    Smoked 1 PDD at his heaviest.  Vaping Use   Vaping status: Never Used  Substance and Sexual Activity   Alcohol use: No   Drug use: No   Sexual activity: Yes  Other Topics Concern   Not on file  Social History Narrative   Patient is single and his daughter lives with him.   Patient has two children.   Patient has a 9th grade education.   Patient works in [employer] (part-time).   Patient drinks one to two cups of coffee daily.   Patient is right-handed.         Per [facility] New Patient Packet:   Diet:Left blank      Caffeine:Yes      Married, if yes what year: Widowed      Do you live in a house, apartment, assisted living, condo, trailer, ect: House, 3 persons      Is it one or more stories: One      Pets: No      Current/Past profession: Recruitment consultant      Highest level or education completed:       Exercise:         No         Type and how often:          Living Will: No   DNR: No   POA/HPOA: No      Functional Status:   Do you have difficulty bathing or dressing yourself? No   Do you have difficulty preparing food or eating? No   Do you have difficulty managing your medications? No   Do you have difficulty managing your finances? No   Do you have difficulty affording your medications? No   Social Drivers of Corporate investment banker Strain: Low Risk  (02/27/2023)   Overall Financial Resource Strain (CARDIA)    Difficulty of Paying Living Expenses: Not hard at all  Food Insecurity: No Food Insecurity  (02/27/2023)   Hunger Vital Sign    Worried About Running Out of Food in the Last Year: Never true    Ran Out of Food in the Last Year: Never true  Transportation Needs: No Transportation Needs (02/27/2023)   PRAPARE - Administrator, Civil Service (Medical): No    Lack of Transportation (Non-Medical): No  Physical Activity: Unknown (02/27/2023)   Exercise Vital Sign    Days of Exercise per Week: Patient declined    Minutes of Exercise per Session: Not on file  Stress: No Stress Concern Present (02/27/2023)   [employer] of Occupational Health - Occupational Stress Questionnaire    Feeling of Stress : Not at all  Social Connections: Moderately Isolated (02/27/2023)   Social Connection and Isolation Panel [NHANES]    Frequency of Communication with Friends and Family: More than three times a week    Frequency of Social Gatherings with Friends and Family: More than three times a week    Attends Religious Services: 1 to 4 times per year    Active Member of [employer] or Organizations: No    Attends Banker Meetings: Never    Marital Status: Widowed  Intimate Partner Violence: Not At Risk (08/08/2022)   Humiliation, Afraid, Rape, and Kick questionnaire    Fear of Current or Ex-Partner: No    Emotionally Abused: No    Physically Abused: No    Sexually Abused: No    Review of Systems Per HPI  Objective:   Vitals:   03/06/23 0914  BP: 128/66  Pulse: 79  Temp: 98.6 F (37 C)  TempSrc: Temporal  SpO2: 93%  Weight: 132 lb 3.2 oz (60 kg)  Height: 5\' 5"  (1.651 m)  Physical Exam Vitals reviewed.  Constitutional:      Appearance: He is well-developed.  HENT:     Head: Normocephalic and atraumatic.  Neck:     Vascular: No carotid bruit or JVD.  Cardiovascular:     Rate and Rhythm: Normal rate and regular rhythm.     Heart sounds: Normal heart sounds. No murmur heard. Pulmonary:     Effort: Pulmonary effort is normal.     Breath sounds: No rales.      Comments: Scattered coarse breath sounds without respiratory distress or apparent wheeze.  Speaking full sentences.  O2 sat 93%. Musculoskeletal:     Right lower leg: No edema.     Left lower leg: No edema.  Skin:    General: Skin is warm and dry.     Comments: Dry patch with central darkening, peripheral hypopigmentation on dorsum of right fourth finger.  See photo.  Neurological:     Mental Status: He is alert and oriented to person, place, and time.  Psychiatric:        Mood and Affect: Mood normal.         Assessment & Plan:  KAMDEN STANISLAW is a 78 y.o. male . Pulmonary emphysema, unspecified emphysema type (HCC)  -Recommended starting Anoro Ellipta as prescribed yesterday, and should hear from DME company about oxygen soon.  Planned airline travel, will need to discuss oxygen use on plane with airline first, and see what they require.  If letter or specific orders needed - may need to come form pulmonary.   Finger lesion - Plan: Ambulatory referral to Dermatology  -Hyperpigmented, nevus centrally with surrounding hypopigmentation.  Question contact dermatitis from cement but less likely.  With changes, will refer to dermatology for evaluation.  Dry skin noted, Eucerin, Aveeno over-the-counter recommended in the interim.  Recheck 2 months for chronic medications and labs at that time.  No orders of the defined types were placed in this encounter.  Patient Instructions  Start the new inhaler that was prescribed by pulmonary yesterday.  You should also be getting a call from the medical supplier for oxygen use at home.  In regards to the upcoming flight, I would recommend contacting the airline to determine what they need for you to use oxygen on the plane including if they are able to provide that oxygen and if any specific order or documentation needed from your pulmonologist.  Let me know if I can help further.  I will refer you to dermatology to evaluate the area on the  finger.  Try using some lotion such as Eucerin or Aveeno for dry skin but I think this may be something more than just plain dry skin.  If you do not hear from them dermatologist in the next few weeks, let me know.  Recheck in 2 months and we can check labs at that time.  No other changes for now.  Take care!      Signed,   Meredith Staggers, MD [facility], [facility] 03/06/23 10:00 AM

## 2023-03-07 NOTE — Telephone Encounter (Signed)
I spoke with Rodney Booze (DPR), she will reach out to the patient's insurance and see if they can through [facility] and pay out of pocket for the first month, then switch back to [employer]. She will let us know how they would like to proceed.  Nothing further needed.

## 2023-03-10 ENCOUNTER — Other Ambulatory Visit (HOSPITAL_COMMUNITY): Payer: Self-pay

## 2023-03-11 ENCOUNTER — Other Ambulatory Visit (HOSPITAL_COMMUNITY): Payer: Self-pay

## 2023-03-11 ENCOUNTER — Encounter (HOSPITAL_COMMUNITY): Payer: Self-pay

## 2023-03-31 ENCOUNTER — Encounter: Payer: Self-pay | Admitting: Student in an Organized Health Care Education/Training Program

## 2023-04-01 NOTE — Telephone Encounter (Signed)
 I only see where the 02 order was sent to [employer]. I have now sent the 02 order to [facility]

## 2023-04-01 NOTE — Telephone Encounter (Signed)
 O2 order was sent to [facility]. Can you check on this? Thank you!

## 2023-04-01 NOTE — Telephone Encounter (Signed)
 Another response from [place] with  [employer] [facility]   Just called again and didn't get an answer but I did leave a 3rd VM. She states they are calling the number that we have

## 2023-04-01 NOTE — Telephone Encounter (Signed)
 I have received a note from [facility] they CXL the order. They don't accept his insurance plan. I have sent urgent message to [employer] to see what happened

## 2023-04-01 NOTE — Telephone Encounter (Signed)
 I received a message from [place] with [employer] [facility]   I left a VM on 03/06/2023 and again on 03/21/2023. I will call him again right now

## 2023-04-02 ENCOUNTER — Institutional Professional Consult (permissible substitution): Payer: Medicare Other | Admitting: Student in an Organized Health Care Education/Training Program

## 2023-04-11 ENCOUNTER — Telehealth: Payer: Self-pay | Admitting: Student in an Organized Health Care Education/Training Program

## 2023-04-11 NOTE — Telephone Encounter (Signed)
 I called and spoke with Alexander [name] and gave her [employer]'s phone number to call [phone]

## 2023-04-11 NOTE — Telephone Encounter (Signed)
 Please see last "Patient Message" about [employer] not being able to get a hold of this PT regarding his 02. Dillinger, Aston (Daughter) [phone]   (DPR) called and said to call her. She is his caregiver. TY.

## 2023-04-14 NOTE — Telephone Encounter (Signed)
 Per Elease Hashimoto with [employer] the patient will need new 02 testing. Last test was 03/05/23 and are more then 44 days old

## 2023-04-14 NOTE — Telephone Encounter (Signed)
 I spoke with Alexander [name] and told her we would need to get Alexander [name] back in the office to walk him again. She asked him if he wanted to schedule a sooner appt then May. He said he did not want to worry about the oxygen. I did tell her that his O2 dropped down to 83% while in the office and that he does need the O2. She said she will stay on him about getting the O2 and call back if he wants to move his appt up.  Nothing further needed.

## 2023-04-15 DIAGNOSIS — D0461 Carcinoma in situ of skin of right upper limb, including shoulder: Secondary | ICD-10-CM | POA: Diagnosis not present

## 2023-04-21 ENCOUNTER — Telehealth: Payer: Self-pay

## 2023-04-21 ENCOUNTER — Ambulatory Visit

## 2023-04-21 ENCOUNTER — Encounter: Payer: Self-pay | Admitting: Dermatology

## 2023-04-21 DIAGNOSIS — J9611 Chronic respiratory failure with hypoxia: Secondary | ICD-10-CM

## 2023-04-21 DIAGNOSIS — R0609 Other forms of dyspnea: Secondary | ICD-10-CM

## 2023-04-21 NOTE — Progress Notes (Signed)
 Patient was in the office today for a 6 min walk. The patient's oxygen dropped down to 85% at the 4 min and 20 second mark.I placed the patient on 3L of O2. His oxygen came back up to 95%. We continued to walk and his O2 maintained on the 3L of O2. The 6 min walk was stopped and it was then a qualifying walk.

## 2023-04-21 NOTE — Telephone Encounter (Signed)
 Patient was seen in the office today for a 6 min walk. His O2 did drop down to 85%. I placed him on 3L of O2 and his O2 came back up to 95%. I have placed an order for O2 to [employer].

## 2023-04-21 NOTE — Addendum Note (Signed)
 Addended by: Bonney Leitz on: 04/21/2023 04:37 PM   Modules accepted: Orders

## 2023-04-22 ENCOUNTER — Encounter: Payer: Self-pay | Admitting: Family Medicine

## 2023-04-22 DIAGNOSIS — D0461 Carcinoma in situ of skin of right upper limb, including shoulder: Secondary | ICD-10-CM | POA: Insufficient documentation

## 2023-04-22 DIAGNOSIS — J432 Centrilobular emphysema: Secondary | ICD-10-CM | POA: Diagnosis not present

## 2023-04-25 ENCOUNTER — Other Ambulatory Visit: Payer: Self-pay | Admitting: Family Medicine

## 2023-04-25 DIAGNOSIS — M5416 Radiculopathy, lumbar region: Secondary | ICD-10-CM

## 2023-04-28 ENCOUNTER — Other Ambulatory Visit: Payer: Self-pay | Admitting: Family Medicine

## 2023-04-28 DIAGNOSIS — M5416 Radiculopathy, lumbar region: Secondary | ICD-10-CM

## 2023-04-28 MED ORDER — HYDROCODONE-ACETAMINOPHEN 5-325 MG PO TABS
1.0000 | ORAL_TABLET | Freq: Four times a day (QID) | ORAL | 0 refills | Status: AC | PRN
Start: 2023-04-28 — End: ?

## 2023-04-28 NOTE — Telephone Encounter (Signed)
 History of chronic back pain, infrequent use of hydrocodone discussed previously.  Controlled substance database reviewed.  Last filled #20 in July 2024.  Refill ordered.

## 2023-04-28 NOTE — Telephone Encounter (Signed)
 Requested Prescriptions   Pending Prescriptions Disp Refills   gabapentin (NEURONTIN) 300 MG capsule [Pharmacy Med Name: GABAPENTIN CAP 300MG  (NEUR)] 600 capsule 2    Sig: TAKE 3 CAPSULES BY MOUTH TWICE  DAILY     Date of patient request: 04/28/2023 Last office visit: 03/06/2023 Upcoming visit: 05/07/2023 Date of last refill: 08/09/2022 Last refill amount: 600x2

## 2023-04-28 NOTE — Telephone Encounter (Signed)
 Requested Prescriptions   Pending Prescriptions Disp Refills   HYDROcodone-acetaminophen (NORCO/VICODIN) 5-325 MG tablet 20 tablet 0    Sig: Take 1 tablet by mouth every 6 (six) hours as needed for moderate pain (pain score 4-6).     Date of patient request: 04/28/2023 Last office visit: 03/06/2023 Upcoming visit: 05/07/2023 Date of last refill: 07/19/2022 Last refill amount: 20

## 2023-05-07 ENCOUNTER — Ambulatory Visit: Payer: Medicare Other | Admitting: Family Medicine

## 2023-05-07 ENCOUNTER — Encounter: Payer: Self-pay | Admitting: Family Medicine

## 2023-05-07 VITALS — BP 118/66 | HR 72 | Temp 98.1°F | Ht 65.0 in | Wt 132.0 lb

## 2023-05-07 DIAGNOSIS — M5416 Radiculopathy, lumbar region: Secondary | ICD-10-CM

## 2023-05-07 DIAGNOSIS — E785 Hyperlipidemia, unspecified: Secondary | ICD-10-CM | POA: Diagnosis not present

## 2023-05-07 DIAGNOSIS — E1159 Type 2 diabetes mellitus with other circulatory complications: Secondary | ICD-10-CM

## 2023-05-07 DIAGNOSIS — I10 Essential (primary) hypertension: Secondary | ICD-10-CM | POA: Diagnosis not present

## 2023-05-07 DIAGNOSIS — Z1211 Encounter for screening for malignant neoplasm of colon: Secondary | ICD-10-CM | POA: Diagnosis not present

## 2023-05-07 DIAGNOSIS — J439 Emphysema, unspecified: Secondary | ICD-10-CM | POA: Diagnosis not present

## 2023-05-07 DIAGNOSIS — Z860100 Personal history of colon polyps, unspecified: Secondary | ICD-10-CM | POA: Diagnosis not present

## 2023-05-07 DIAGNOSIS — Z7984 Long term (current) use of oral hypoglycemic drugs: Secondary | ICD-10-CM | POA: Diagnosis not present

## 2023-05-07 LAB — LIPID PANEL
Cholesterol: 87 mg/dL (ref 0–200)
HDL: 35.4 mg/dL — ABNORMAL LOW (ref >39.00)
LDL Cholesterol: 43 mg/dL (ref 0–99)
NonHDL: 51.27
Total CHOL/HDL Ratio: 2
Triglycerides: 42 mg/dL (ref 0.0–149.0)
VLDL: 8.4 mg/dL (ref 0.0–40.0)

## 2023-05-07 LAB — COMPREHENSIVE METABOLIC PANEL WITH GFR
ALT: 11 U/L (ref 0–53)
AST: 17 U/L (ref 0–37)
Albumin: 4.5 g/dL (ref 3.5–5.2)
Alkaline Phosphatase: 102 U/L (ref 39–117)
BUN: 15 mg/dL (ref 6–23)
CO2: 26 meq/L (ref 19–32)
Calcium: 9.9 mg/dL (ref 8.4–10.5)
Chloride: 103 meq/L (ref 96–112)
Creatinine, Ser: 1.11 mg/dL (ref 0.40–1.50)
GFR: 64.22 mL/min (ref >60.00)
Glucose, Bld: 120 mg/dL — ABNORMAL HIGH (ref 70–99)
Potassium: 4.3 meq/L (ref 3.5–5.1)
Sodium: 137 meq/L (ref 135–145)
Total Bilirubin: 0.4 mg/dL (ref 0.2–1.2)
Total Protein: 7.6 g/dL (ref 6.0–8.3)

## 2023-05-07 LAB — MICROALBUMIN / CREATININE URINE RATIO
Creatinine,U: 114 mg/dL
Microalb Creat Ratio: 34.3 mg/g — ABNORMAL HIGH (ref 0.0–30.0)
Microalb, Ur: 3.9 mg/dL — ABNORMAL HIGH (ref 0.0–1.9)

## 2023-05-07 LAB — HEMOGLOBIN A1C: Hgb A1c MFr Bld: 7.5 % — ABNORMAL HIGH (ref 4.6–6.5)

## 2023-05-07 MED ORDER — AMLODIPINE BESYLATE 10 MG PO TABS: 10.0000 mg | ORAL_TABLET | Freq: Every day | ORAL | 2 refills | Status: DC

## 2023-05-07 NOTE — Patient Instructions (Signed)
 Thanks for coming in today.  No medication changes at this time.  Keep follow-up with your diabetes doctor, lung doctor and back specialist as planned.  If any concerns on labs I will let you know.  Take care!

## 2023-05-07 NOTE — Progress Notes (Signed)
 Subjective:  Patient ID: Alexander [name], male    DOB: January 03, 1946  Age: 78 y.o. MRN: [id number]  CC:  Chief Complaint  Patient presents with   Medical Management of Chronic Issues    Pt is well today no questions or concerns, pt is fasting    Health Maintenance    Patient has consult appt today for colon cancer screening, declined foot exam and both Pneumonia and shingles vaccines.  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  Recent 6-minute walk on 04/21/2023, O2 sat dropped to 85%, placed on 3 L O2 with return to 95%, order was placed for oxygen to [employer].  Followed by pulmonary, Dr. Aundria Rud, appointment in February, started on Anoro Ellipta.  Pneumonia and shingles vaccines declined. Breathing has been stable. Anoro has been helpful. O2 at night only. Denies new DOE. Minimal walking.   Diabetes: Complicated by hyperglycemia, microalbuminuria.  Previously treated with metformin, glipizide and prior hypoglycemia with medication adjustments.  He is on statin with Lipitor 40 mg daily.  No hypoglycemia with 5 mg glipizide twice daily, metformin 1000 mg twice daily. Seen by endocrinology, Ocie Cornfield in November - due for appt - plans to schedule.  Home readings - none. Microalbumin: Normal ratio 01/31/2022.repeat today.  Lab Results  Component Value Date   HGBA1C 6.9 (H) 11/15/2022   HGBA1C 7.7 (H) 08/15/2022   HGBA1C 8.5 (H) 05/02/2022   Lab Results  Component Value Date   MICROALBUR 1.3 01/31/2022   LDLCALC 45 11/15/2022   CREATININE 1.15 02/27/2023   Hypertension: Amlodipine 10 mg daily.  Denies any side effects. No pedal edema.  Home readings: None BP Readings from Last 3 Encounters:  05/07/23 118/66  03/06/23 128/66  03/05/23 128/80   Lab Results  Component Value Date   CREATININE 1.15 02/27/2023   Hyperlipidemia: Lipitor 40  mg daily.  Denies any myalgias or side effects, prior elevated often phosphatase that then returned to normal repeat testing. Lab Results  Component Value Date   CHOL 93 11/15/2022   HDL 39.30 11/15/2022   LDLCALC 45 11/15/2022   TRIG 47.0 11/15/2022   CHOLHDL 2 11/15/2022   Lab Results  Component Value Date   ALT 13 11/15/2022   AST 18 11/15/2022   ALKPHOS 107 11/15/2022   BILITOT 0.5 11/15/2022    History of chronic back pain treated with back specialist, treated with gabapentin.  Denies side effects with gabapentin including dizziness or sedation. Some pain in hip at times off and on, no fall//injury.  Gabapentin 300mg  3 BID. No SE's.   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 04/22/2023   Chronic pain syndrome 03/06/2023   History of colonic polyps 03/06/2023   Right carpal tunnel syndrome 06/08/2019   Malignant neoplasm of prostate (HCC) 10/27/2018   Allergic reaction 08/10/2018   Angioedema 08/10/2018   Peripheral arterial occlusive disease (HCC) 10/28/2016   Hyperlipidemia LDL goal <70 05/01/2015   Tobacco use disorder 12/09/2013   Coronary artery disease involving native coronary artery of native heart without angina pectoris 12/09/2013   Erectile dysfunction due to diseases classified elsewhere 12/09/2013   Type 2 diabetes mellitus with other circulatory complications (HCC) 12/09/2013  Elevated prostate specific antigen (PSA) 09/17/2013   Spinal stenosis of lumbar region 01/27/2013   Left lumbar radiculopathy 09/30/2012   Chronic rhinitis 04/08/2011   Hypertension    HTN (hypertension), benign 03/09/2011   History of prostate cancer 03/09/2011   Past Medical History:  Diagnosis Date   Angio-edema    CAD (coronary artery disease)    Per [facility] New Patient Packet    Carpal tunnel syndrome    Per [facility] New Patient  Packet    Diabetes mellitus without complication (HCC)    Hypertension    Neuromuscular disorder (HCC)    Peripheral arterial disease (HCC) 11/14/2016   non-occlusive, asymptomatic, seen by vascular surgery Dr. Nolene Baumgarten who rec repeated ABIs annually   Prostate cancer [facility])    Past Surgical History:  Procedure Laterality Date   CATARACT EXTRACTION Right 08/23/2020   COLON SURGERY     "locked bowel" fixed   COLONOSCOPY  2019   HERNIA REPAIR     LEFT HEART CATHETERIZATION WITH CORONARY ANGIOGRAM N/A 09/15/2013   Procedure: LEFT HEART CATHETERIZATION WITH CORONARY ANGIOGRAM;  Surgeon: Peter M Swaziland, MD;  Location: [facility] CATH LAB;  Service: Cardiovascular;  Laterality: N/A;   PROSTATE SURGERY  2005   Allergies  Allergen Reactions   Lisinopril Swelling    angioedema   Prior to Admission medications   Medication Sig Start Date End Date Taking? Authorizing Provider  amLODipine (NORVASC) 10 MG tablet TAKE 1 TABLET BY MOUTH DAILY 09/24/22  Yes Benjiman Bras, MD  aspirin EC 81 MG tablet Take 81 mg by mouth daily as needed for mild pain.    Yes [provider]  atorvastatin (LIPITOR) 40 MG tablet Take 1 tablet (40 mg total) by mouth daily. 11/15/22  Yes Benjiman Bras, MD  Cholecalciferol (VITAMIN D3) 50 MCG (2000 UT) TABS Take by mouth.   Yes [provider]  diphenhydrAMINE (BENADRYL) 25 MG tablet Take 25 mg by mouth every 6 (six) hours as needed.   Yes [provider]  fexofenadine (ALLEGRA) 180 MG tablet Take 1 tablet (180 mg total) by mouth daily. 08/10/18  Yes Bobbitt, Colen Daunt, MD  fluticasone [facility]) 50 MCG/ACT nasal spray Place 2 sprays into both nostrils daily. 03/18/19  Yes Elyce Hams, Marguerita Shih, MD  gabapentin (NEURONTIN) 300 MG capsule TAKE 3 CAPSULES BY MOUTH TWICE  DAILY 04/28/23  Yes Benjiman Bras, MD  glipiZIDE (GLUCOTROL) 5 MG tablet Take 1 tablet (5 mg total) by mouth 2 (two) times daily before a meal. 11/15/22  Yes Benjiman Bras, MD   HYDROcodone-acetaminophen (NORCO/VICODIN) 5-325 MG tablet Take 1 tablet by mouth every 6 (six) hours as needed for moderate pain (pain score 4-6). 04/28/23  Yes Benjiman Bras, MD  metFORMIN (GLUCOPHAGE) 1000 MG tablet Take 1 tablet (1,000 mg total) by mouth 2 (two) times daily with a meal. 11/15/22  Yes Benjiman Bras, MD  nicotine polacrilex (NICOTINE MINI) 4 MG lozenge Dissolve 1 lozenge (4 mg total) by mouth every 2 (two) hours as needed for smoking cessation. 03/05/23 06/03/23 Yes Dgayli, Berneta Brightly, MD  sildenafil (VIAGRA) 100 MG tablet Take 1 tablet (100 mg total) by mouth as needed 1hr prior to sexual activities 12/21/21  Yes   tiZANidine (ZANAFLEX) 2 MG tablet TAKE 1 TABLET BY MOUTH EVERY 6  HOURS AS NEEDED FOR MUSCLE  SPASM(S) 02/07/22  Yes Benjiman Bras, MD  triamcinolone cream (KENALOG) 0.1 % APPLY TOPICALLY TWICE DAILY AS  NEEDED 02/07/22  Yes Caro Christmas  R, MD  umeclidinium-vilanterol (ANORO ELLIPTA) 62.5-25 MCG/ACT AEPB Inhale 1 puff into the lungs daily. 03/05/23  Yes Vergia Glasgow, MD   Social History   Socioeconomic History   Marital status: Widowed    Spouse name: Not on file   Number of children: 2   Years of education: 9   Highest education level: 12th grade  Occupational History   Occupation: retired    Comment: Ecologist  Tobacco Use   Smoking status: Every Day    Types: Cigarettes   Smokeless tobacco: Never   Tobacco comments:    Smokes 0.75 PPD- khj 03/05/2023    Started smoking at 78 years old    Smoked 1 PDD at his heaviest.  Vaping Use   Vaping status: Never Used  Substance and Sexual Activity   Alcohol use: No   Drug use: No   Sexual activity: Yes  Other Topics Concern   Not on file  Social History Narrative   Patient is single and his daughter lives with him.   Patient has two children.   Patient has a 9th grade education.   Patient works in [employer] (part-time).   Patient drinks one to two cups of coffee daily.   Patient is  right-handed.         Per [facility] New Patient Packet:   Diet:Left blank      Caffeine:Yes      Married, if yes what year: Widowed      Do you live in a house, apartment, assisted living, condo, trailer, ect: House, 3 persons      Is it one or more stories: One      Pets: No      Current/Past profession: Recruitment consultant      Highest level or education completed:       Exercise:         No         Type and how often:          Living Will: No   DNR: No   POA/HPOA: No      Functional Status:   Do you have difficulty bathing or dressing yourself? No   Do you have difficulty preparing food or eating? No   Do you have difficulty managing your medications? No   Do you have difficulty managing your finances? No   Do you have difficulty affording your medications? No   Social Drivers of Corporate investment banker Strain: Low Risk  (02/27/2023)   Overall Financial Resource Strain (CARDIA)    Difficulty of Paying Living Expenses: Not hard at all  Food Insecurity: No Food Insecurity (02/27/2023)   Hunger Vital Sign    Worried About Running Out of Food in the Last Year: Never true    Ran Out of Food in the Last Year: Never true  Transportation Needs: No Transportation Needs (02/27/2023)   PRAPARE - Administrator, Civil Service (Medical): No    Lack of Transportation (Non-Medical): No  Physical Activity: Unknown (02/27/2023)   Exercise Vital Sign    Days of Exercise per Week: Patient declined    Minutes of Exercise per Session: Not on file  Stress: No Stress Concern Present (02/27/2023)   [employer] of Occupational Health - Occupational Stress Questionnaire    Feeling of Stress : Not at all  Social Connections: Moderately Isolated (02/27/2023)   Social Connection and Isolation Panel [NHANES]    Frequency of Communication with Friends and Family: More than  three times a week    Frequency of Social Gatherings with Friends and Family: More than three times a week    Attends  Religious Services: 1 to 4 times per year    Active Member of [employer] or Organizations: No    Attends Banker Meetings: Never    Marital Status: Widowed  Intimate Partner Violence: Not At Risk (08/08/2022)   Humiliation, Afraid, Rape, and Kick questionnaire    Fear of Current or Ex-Partner: No    Emotionally Abused: No    Physically Abused: No    Sexually Abused: No    Review of Systems  Constitutional:  Negative for fatigue and unexpected weight change.  Eyes:  Negative for visual disturbance.  Respiratory:  Negative for cough, chest tightness and shortness of breath.        Denies new dyspnea.   Cardiovascular:  Negative for chest pain, palpitations and leg swelling.  Gastrointestinal:  Negative for abdominal pain and blood in stool.  Neurological:  Negative for dizziness, light-headedness and headaches.     Objective:   Vitals:   05/07/23 0841  BP: 118/66  Pulse: 72  Temp: 98.1 F (36.7 C)  TempSrc: Temporal  SpO2: 95%  Weight: 132 lb (59.9 kg)  Height: 5\' 5"  (1.651 m)     Physical Exam Vitals reviewed.  Constitutional:      Appearance: He is well-developed.  HENT:     Head: Normocephalic and atraumatic.  Neck:     Vascular: No carotid bruit or JVD.  Cardiovascular:     Rate and Rhythm: Normal rate and regular rhythm.     Heart sounds: Normal heart sounds. No murmur heard. Pulmonary:     Effort: Pulmonary effort is normal.     Breath sounds: Normal breath sounds. No rales.  Musculoskeletal:     Right lower leg: No edema.     Left lower leg: No edema.  Skin:    General: Skin is warm and dry.  Neurological:     Mental Status: He is alert and oriented to person, place, and time.  Psychiatric:        Mood and Affect: Mood normal.        Assessment & Plan:  Alexander [name] is a 78 y.o. male . Pulmonary emphysema, unspecified emphysema type (HCC)  Tolerated Anoro, reports improved symptoms.  Lungs clear in exam.  On oxygen at home.  95% on  room air in office.  Essential hypertension - Plan: Comprehensive metabolic panel with GFR, amLODipine (NORVASC) 10 MG tablet  - Stable, tolerating current med regimen, continue same.  Check labs and adjust plan accordingly  Type 2 diabetes mellitus with other circulatory complications (HCC) - Plan: Hemoglobin A1c, Microalbumin / creatinine urine ratio  - Tolerating glipizide, metformin, follow-up with endocrinology, I will check labs as above including A1c, urine microalbumin in the interim.  Lumbar radiculopathy, chronic  - With episodic pain in the hip.  Tolerating gabapentin, denies new side effects but side effects were discussed.  Follow-up with back specialist, RTC precautions.  Hyperlipidemia LDL goal <70 - Plan: Comprehensive metabolic panel with GFR, Lipid panel  - Tolerating statin, check labs and adjust plan accordingly.  Meds ordered this encounter  Medications   amLODipine (NORVASC) 10 MG tablet    Sig: Take 1 tablet (10 mg total) by mouth daily.    Dispense:  100 tablet    Refill:  2    Please send a replace/new response with 100-Day Supply if  appropriate to maximize member benefit. Requesting 1 year supply.   Patient Instructions  Thanks for coming in today.  No medication changes at this time.  Keep follow-up with your diabetes doctor, lung doctor and back specialist as planned.  If any concerns on labs I will let you know.  Take care!    Signed,   Caro Christmas, MD [facility], [facility] 05/07/23 9:12 AM

## 2023-05-13 ENCOUNTER — Encounter: Payer: Self-pay | Admitting: Family Medicine

## 2023-05-13 NOTE — Telephone Encounter (Signed)
 Pt daughter is asking for muscle relaxer for toe pain has upcoming appt.  Please advise

## 2023-05-13 NOTE — Telephone Encounter (Signed)
 Typically would not use a muscle relaxant for toe pain.  Additionally muscle relaxers can be somewhat risky at his age.  Would need to discuss that further prior to starting.  He is on gabapentin  for radiculopathy, has he been taking that?  He does have peripheral arterial occlusive disease, would want make sure he is not having any critical toe ischemia or lack of blood flow.  If any cold natured to the toe, skin or color changes, should be seen right away.

## 2023-05-19 ENCOUNTER — Encounter: Payer: Self-pay | Admitting: Family Medicine

## 2023-05-19 ENCOUNTER — Ambulatory Visit (INDEPENDENT_AMBULATORY_CARE_PROVIDER_SITE_OTHER): Payer: Medicare Other | Admitting: Family Medicine

## 2023-05-19 VITALS — BP 144/68 | HR 81 | Temp 98.0°F | Ht 64.0 in | Wt 132.0 lb

## 2023-05-19 DIAGNOSIS — M5416 Radiculopathy, lumbar region: Secondary | ICD-10-CM

## 2023-05-19 DIAGNOSIS — M62838 Other muscle spasm: Secondary | ICD-10-CM

## 2023-05-19 DIAGNOSIS — R0602 Shortness of breath: Secondary | ICD-10-CM | POA: Insufficient documentation

## 2023-05-19 DIAGNOSIS — R600 Localized edema: Secondary | ICD-10-CM | POA: Diagnosis not present

## 2023-05-19 MED ORDER — TIZANIDINE HCL 2 MG PO TABS: 2.0000 mg | ORAL_TABLET | Freq: Three times a day (TID) | ORAL | 0 refills | Status: DC | PRN

## 2023-05-19 NOTE — Patient Instructions (Addendum)
 Thanks for coming in today.  I did refill the tizanidine  if needed for breakthrough muscle spasms or back issues but be careful combining that with any other sedating medicines like gabapentin  or any pain medications.  Continue follow-up with your back specialist if needed including if needed for repeat injection but it sounds like symptoms are controlled with gabapentin  for now.  See information below on pedal edema or swelling in the legs.  Because that only occurs when sitting in the glider and not during the day, not with lying flat, I am not concerned about the symptoms at this time.  Continue to monitor and if you notice more persistent swelling or any worsening symptoms including any calf pains swelling in just one side, or other new symptoms be seen.  Otherwise follow-up with me in October as planned and take care!  Edema  Edema is when you have too much fluid in your body or under your skin. Edema may make your legs, feet, and ankles swell. Swelling often happens in looser tissues, such as around your eyes. This is a common condition. It gets more common as you get older. There are many possible causes of edema. These include: Eating too much salt (sodium). Being on your feet or sitting for a long time. Certain medical conditions, such as: Pregnancy. Heart failure. Liver disease. Kidney disease. Cancer. Hot weather may make edema worse. Edema is usually painless. Your skin may look swollen or shiny. Follow these instructions at home: Medicines Take over-the-counter and prescription medicines only as told by your doctor. Your doctor may prescribe a medicine to help your body get rid of extra water (diuretic). Take this medicine if you are told to take it. Eating and drinking Eat a low-salt (low-sodium) diet as told by your doctor. Sometimes, eating less salt may reduce swelling. Depending on the cause of your swelling, you may need to limit how much fluid you drink (fluid  restriction). General instructions Raise the injured area above the level of your heart while you are sitting or lying down. Do not sit still or stand for a long time. Do not wear tight clothes. Do not wear garters on your upper legs. Exercise your legs. This can help the swelling go down. Wear compression stockings as told by your doctor. It is important that these are the right size. These should be prescribed by your doctor to prevent possible injuries. If elastic bandages or wraps are recommended, use them as told by your doctor. Contact a doctor if: Treatment is not working. You have heart, liver, or kidney disease and have symptoms of edema. You have sudden and unexplained weight gain. Get help right away if: You have shortness of breath or chest pain. You cannot breathe when you lie down. You have pain, redness, or warmth in the swollen areas. You have heart, liver, or kidney disease and get edema all of a sudden. You have a fever and your symptoms get worse all of a sudden. These symptoms may be an emergency. Get help right away. Call 911. Do not wait to see if the symptoms will go away. Do not drive yourself to the hospital. Summary Edema is when you have too much fluid in your body or under your skin. Edema may make your legs, feet, and ankles swell. Swelling often happens in looser tissues, such as around your eyes. Raise the injured area above the level of your heart while you are sitting or lying down. Follow your doctor's instructions about diet  and how much fluid you can drink. This information is not intended to replace advice given to you by your health care provider. Make sure you discuss any questions you have with your health care provider. Document Revised: 09/11/2020 Document Reviewed: 09/11/2020 Elsevier Patient Education  2024 [employer].

## 2023-05-19 NOTE — Progress Notes (Signed)
 Subjective:  Patient ID: Alexander [name], male    DOB: 1945/10/06  Age: 78 y.o. MRN: [id number]  CC:  Chief Complaint  Patient presents with   Foot Problem    Pt notes some numbness and swelling in his feet, mostly Lt foot but now Rt foot is involved, notes a throbbing pain, pt first noticed this about 3-4 months ago.  Pt requesting tizanadine refill he has run out     HPI SKYLON MEANS presents for   Lumbar radiculopathy, chronic Followed by back specialist -[facility], treated with gabapentin  for chronic back pain.  300 mg, 3 twice daily at his last visit earlier this month.  Denied any side effects, dizziness or sedation with that dose. Lumbar spinal stenosis with neurogenic claudication, lumbar facet arthropathy and chronic pain syndrome based on 08/01/2022 neurosurgery notes.  Seen by Dr. Crecencio Dodge at that time, option of repeat transforaminal epidural steroid injections.  Has taken tizanidine  for back pain, leg symptoms previously, out of meds. Reports last injection few months ago. Returned to work after injection, pain returned.   Both feet swell if falling asleep in recliner. Falls asleep in glider rocker after gabapentin  some times and then feet swell improves with activity during the day or lying down at night. No calf pain, CP, or new dyspnea.  Gapapentin working well for back, feet numbness, tingling, and pain in feet.   Rare need for tizanidine  in past with leg cramps. No sedation/dizziness/lightheadedness when taking. #15 on 02/07/22.   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 04/22/2023   Chronic pain syndrome 03/06/2023   History of colonic polyps 03/06/2023   Right carpal tunnel syndrome 06/08/2019   Malignant neoplasm of prostate (HCC) 10/27/2018   Allergic reaction 08/10/2018   Angioedema 08/10/2018   Peripheral arterial  occlusive disease (HCC) 10/28/2016   Hyperlipidemia LDL goal <70 05/01/2015   Tobacco use disorder 12/09/2013   Coronary artery disease involving native coronary artery of native heart without angina pectoris 12/09/2013   Erectile dysfunction due to diseases classified elsewhere 12/09/2013   Type 2 diabetes mellitus with other circulatory complications (HCC) 12/09/2013   Elevated prostate specific antigen (PSA) 09/17/2013   Spinal stenosis of lumbar region 01/27/2013   Left lumbar radiculopathy 09/30/2012   Chronic rhinitis 04/08/2011   Hypertension    HTN (hypertension), benign 03/09/2011   History of prostate cancer 03/09/2011   Past Medical History:  Diagnosis Date   Angio-edema    CAD (coronary artery disease)    Per [facility] New Patient Packet    Carpal tunnel syndrome    Per [facility] New Patient Packet    Diabetes mellitus without complication (HCC)    Hypertension    Neuromuscular disorder (HCC)    Peripheral arterial disease (HCC) 11/14/2016   non-occlusive, asymptomatic, seen by vascular surgery Dr. Nolene Baumgarten who rec repeated ABIs annually   Prostate cancer [facility])    Past Surgical History:  Procedure Laterality Date   CATARACT EXTRACTION Right 08/23/2020   COLON SURGERY     "locked bowel" fixed   COLONOSCOPY  2019   HERNIA REPAIR     LEFT HEART CATHETERIZATION WITH CORONARY ANGIOGRAM N/A 09/15/2013   Procedure: LEFT HEART CATHETERIZATION WITH CORONARY ANGIOGRAM;  Surgeon: Peter M Swaziland, MD;  Location: [facility] CATH LAB;  Service: Cardiovascular;  Laterality: N/A;   PROSTATE SURGERY  2005  Allergies  Allergen Reactions   Lisinopril  Swelling    angioedema   Prior to Admission medications   Medication Sig Start Date End Date Taking? Authorizing Provider  amLODipine  (NORVASC ) 10 MG tablet Take 1 tablet (10 mg total) by mouth daily. 05/07/23  Yes Benjiman Bras, MD  aspirin  EC 81 MG tablet Take 81 mg by mouth daily as needed for mild pain.    Yes [provider]   atorvastatin  (LIPITOR) 40 MG tablet Take 1 tablet (40 mg total) by mouth daily. 11/15/22  Yes Benjiman Bras, MD  Cholecalciferol (VITAMIN D3) 50 MCG (2000 UT) TABS Take by mouth.   Yes [provider]  diphenhydrAMINE  (BENADRYL ) 25 MG tablet Take 25 mg by mouth every 6 (six) hours as needed.   Yes [provider]  fexofenadine  (ALLEGRA ) 180 MG tablet Take 1 tablet (180 mg total) by mouth daily. 08/10/18  Yes Bobbitt, Colen Daunt, MD  fluticasone  (FLONASE ) 50 MCG/ACT nasal spray Place 2 sprays into both nostrils daily. 03/18/19  Yes Elyce Hams, Marguerita Shih, MD  gabapentin  (NEURONTIN ) 300 MG capsule TAKE 3 CAPSULES BY MOUTH TWICE  DAILY 04/28/23  Yes Benjiman Bras, MD  glipiZIDE  (GLUCOTROL ) 5 MG tablet Take 1 tablet (5 mg total) by mouth 2 (two) times daily before a meal. 11/15/22  Yes Benjiman Bras, MD  HYDROcodone -acetaminophen  (NORCO/VICODIN) 5-325 MG tablet Take 1 tablet by mouth every 6 (six) hours as needed for moderate pain (pain score 4-6). 04/28/23  Yes Benjiman Bras, MD  metFORMIN  (GLUCOPHAGE ) 1000 MG tablet Take 1 tablet (1,000 mg total) by mouth 2 (two) times daily with a meal. 11/15/22  Yes Benjiman Bras, MD  nicotine  polacrilex (NICOTINE  MINI) 4 MG lozenge Dissolve 1 lozenge (4 mg total) by mouth every 2 (two) hours as needed for smoking cessation. 03/05/23 06/03/23 Yes Dgayli, Berneta Brightly, MD  sildenafil  (VIAGRA ) 100 MG tablet Take 1 tablet (100 mg total) by mouth as needed 1hr prior to sexual activities 12/21/21  Yes   triamcinolone  cream (KENALOG ) 0.1 % APPLY TOPICALLY TWICE DAILY AS  NEEDED 02/07/22  Yes Benjiman Bras, MD  umeclidinium-vilanterol (ANORO ELLIPTA ) 62.5-25 MCG/ACT AEPB Inhale 1 puff into the lungs daily. 03/05/23  Yes Dgayli, Berneta Brightly, MD  tiZANidine  (ZANAFLEX ) 2 MG tablet TAKE 1 TABLET BY MOUTH EVERY 6  HOURS AS NEEDED FOR MUSCLE  SPASM(S) Patient not taking: Reported on 05/19/2023 02/07/22   Benjiman Bras, MD   Social History    Socioeconomic History   Marital status: Widowed    Spouse name: Not on file   Number of children: 2   Years of education: 9   Highest education level: 12th grade  Occupational History   Occupation: retired    Comment: Ecologist  Tobacco Use   Smoking status: Every Day    Types: Cigarettes   Smokeless tobacco: Never   Tobacco comments:    Smokes 0.75 PPD- khj 03/05/2023    Started smoking at 78 years old    Smoked 1 PDD at his heaviest.  Vaping Use   Vaping status: Never Used  Substance and Sexual Activity   Alcohol use: No   Drug use: No   Sexual activity: Yes  Other Topics Concern   Not on file  Social History Narrative   Patient is single and his daughter lives with him.   Patient has two children.   Patient has a 9th grade education.   Patient works in [employer] (part-time).   Patient  drinks one to two cups of coffee daily.   Patient is right-handed.         Per [facility] New Patient Packet:   Diet:Left blank      Caffeine:Yes      Married, if yes what year: Widowed      Do you live in a house, apartment, assisted living, condo, trailer, ect: House, 3 persons      Is it one or more stories: One      Pets: No      Current/Past profession: Recruitment consultant      Highest level or education completed:       Exercise:         No         Type and how often:          Living Will: No   DNR: No   POA/HPOA: No      Functional Status:   Do you have difficulty bathing or dressing yourself? No   Do you have difficulty preparing food or eating? No   Do you have difficulty managing your medications? No   Do you have difficulty managing your finances? No   Do you have difficulty affording your medications? No   Social Drivers of Corporate investment banker Strain: Low Risk  (02/27/2023)   Overall Financial Resource Strain (CARDIA)    Difficulty of Paying Living Expenses: Not hard at all  Food Insecurity: No Food Insecurity (02/27/2023)   Hunger Vital Sign    Worried  About Running Out of Food in the Last Year: Never true    Ran Out of Food in the Last Year: Never true  Transportation Needs: No Transportation Needs (02/27/2023)   PRAPARE - Administrator, Civil Service (Medical): No    Lack of Transportation (Non-Medical): No  Physical Activity: Unknown (02/27/2023)   Exercise Vital Sign    Days of Exercise per Week: Patient declined    Minutes of Exercise per Session: Not on file  Stress: No Stress Concern Present (02/27/2023)   [employer] of Occupational Health - Occupational Stress Questionnaire    Feeling of Stress : Not at all  Social Connections: Moderately Isolated (02/27/2023)   Social Connection and Isolation Panel [NHANES]    Frequency of Communication with Friends and Family: More than three times a week    Frequency of Social Gatherings with Friends and Family: More than three times a week    Attends Religious Services: 1 to 4 times per year    Active Member of [employer] or Organizations: No    Attends Banker Meetings: Never    Marital Status: Widowed  Intimate Partner Violence: Not At Risk (08/08/2022)   Humiliation, Afraid, Rape, and Kick questionnaire    Fear of Current or Ex-Partner: No    Emotionally Abused: No    Physically Abused: No    Sexually Abused: No    Review of Systems Per hPI.   Objective:   Vitals:   05/19/23 0937 05/19/23 1017  BP: (!) 160/82 (!) 144/68  Pulse: 81   Temp: 98 F (36.7 C)   TempSrc: Temporal   SpO2: 98%   Weight: 132 lb (59.9 kg)   Height: 5\' 4"  (1.626 m)     Physical Exam Vitals reviewed.  Constitutional:      Appearance: He is well-developed.  HENT:     Head: Normocephalic and atraumatic.  Neck:     Vascular: No carotid bruit or JVD.  Cardiovascular:     Rate and Rhythm: Normal rate and regular rhythm.     Heart sounds: Normal heart sounds. No murmur heard. Pulmonary:     Effort: Pulmonary effort is normal.     Breath sounds: Normal breath sounds. No rales.   Musculoskeletal:     Right lower leg: No edema.     Left lower leg: No edema.     Comments: No current edema on exam, calves nontender with negative Homans.  Skin:    General: Skin is warm and dry.  Neurological:     Mental Status: He is alert and oriented to person, place, and time.  Psychiatric:        Mood and Affect: Mood normal.     Assessment & Plan:  KENTLEY BOGARDUS is a 78 y.o. male . Pedal edema  - Infrequent symptoms, usually dependent edema overnight that resolves.  Calves nontender, negative Homans, no sign of DVT or symptoms of DVT at this time.  He does not have any edema on exam at this time.  Dependent/pedal edema discussed, elevation of legs overnight, RTC precautions if persistent or worsening.  Hold on ultrasound/evaluation at this time given asymptomatic in office.  Lumbar radiculopathy, chronic - Plan: tiZANidine  (ZANAFLEX ) 2 MG tablet Muscle spasms of both lower extremities - Plan: tiZANidine  (ZANAFLEX ) 2 MG tablet  - Chronic lumbar radiculopathy treated by neurosurgery, reports stable control of symptoms with current dosing of gabapentin  900 mg twice daily, denies new side effects with that medication.  Intermittent flare of pain into the legs that has been treated with tizanidine  in the past and denies side effects with this medication.  Infrequent use, refilled with precautions, side effects discussed and cautioned against combining with other sedating medications.    Meds ordered this encounter  Medications   tiZANidine  (ZANAFLEX ) 2 MG tablet    Sig: Take 1 tablet (2 mg total) by mouth every 8 (eight) hours as needed for muscle spasms.    Dispense:  15 tablet    Refill:  0   Patient Instructions   Thanks for coming in today.  I did refill the tizanidine  if needed for breakthrough muscle spasms or back issues but be careful combining that with any other sedating medicines like gabapentin  or any pain medications.  Continue follow-up with your back  specialist if needed including if needed for repeat injection but it sounds like symptoms are controlled with gabapentin  for now.  See information below on pedal edema or swelling in the legs.  Because that only occurs when sitting in the glider and not during the day, not with lying flat, I am not concerned about the symptoms at this time.  Continue to monitor and if you notice more persistent swelling or any worsening symptoms including any calf pains swelling in just one side, or other new symptoms be seen.  Otherwise follow-up with me in October as planned and take care!  Edema  Edema is when you have too much fluid in your body or under your skin. Edema may make your legs, feet, and ankles swell. Swelling often happens in looser tissues, such as around your eyes. This is a common condition. It gets more common as you get older. There are many possible causes of edema. These include: Eating too much salt (sodium). Being on your feet or sitting for a long time. Certain medical conditions, such as: Pregnancy. Heart failure. Liver disease. Kidney disease. Cancer. Hot weather may make edema worse. Edema is usually  painless. Your skin may look swollen or shiny. Follow these instructions at home: Medicines Take over-the-counter and prescription medicines only as told by your doctor. Your doctor may prescribe a medicine to help your body get rid of extra water (diuretic). Take this medicine if you are told to take it. Eating and drinking Eat a low-salt (low-sodium) diet as told by your doctor. Sometimes, eating less salt may reduce swelling. Depending on the cause of your swelling, you may need to limit how much fluid you drink (fluid restriction). General instructions Raise the injured area above the level of your heart while you are sitting or lying down. Do not sit still or stand for a long time. Do not wear tight clothes. Do not wear garters on your upper legs. Exercise your legs. This  can help the swelling go down. Wear compression stockings as told by your doctor. It is important that these are the right size. These should be prescribed by your doctor to prevent possible injuries. If elastic bandages or wraps are recommended, use them as told by your doctor. Contact a doctor if: Treatment is not working. You have heart, liver, or kidney disease and have symptoms of edema. You have sudden and unexplained weight gain. Get help right away if: You have shortness of breath or chest pain. You cannot breathe when you lie down. You have pain, redness, or warmth in the swollen areas. You have heart, liver, or kidney disease and get edema all of a sudden. You have a fever and your symptoms get worse all of a sudden. These symptoms may be an emergency. Get help right away. Call 911. Do not wait to see if the symptoms will go away. Do not drive yourself to the hospital. Summary Edema is when you have too much fluid in your body or under your skin. Edema may make your legs, feet, and ankles swell. Swelling often happens in looser tissues, such as around your eyes. Raise the injured area above the level of your heart while you are sitting or lying down. Follow your doctor's instructions about diet and how much fluid you can drink. This information is not intended to replace advice given to you by your health care provider. Make sure you discuss any questions you have with your health care provider. Document Revised: 09/11/2020 Document Reviewed: 09/11/2020 Elsevier Patient Education  2024 Elsevier Inc.    Signed,   Caro Christmas, MD [facility], [facility] 05/19/23 10:18 AM

## 2023-05-22 DIAGNOSIS — J432 Centrilobular emphysema: Secondary | ICD-10-CM | POA: Diagnosis not present

## 2023-05-27 DIAGNOSIS — Z961 Presence of intraocular lens: Secondary | ICD-10-CM | POA: Diagnosis not present

## 2023-05-27 DIAGNOSIS — E119 Type 2 diabetes mellitus without complications: Secondary | ICD-10-CM | POA: Diagnosis not present

## 2023-05-27 DIAGNOSIS — H524 Presbyopia: Secondary | ICD-10-CM | POA: Diagnosis not present

## 2023-05-27 LAB — HM DIABETES EYE EXAM

## 2023-06-04 ENCOUNTER — Other Ambulatory Visit: Payer: Self-pay | Admitting: Family Medicine

## 2023-06-04 DIAGNOSIS — M62838 Other muscle spasm: Secondary | ICD-10-CM

## 2023-06-04 DIAGNOSIS — E1159 Type 2 diabetes mellitus with other circulatory complications: Secondary | ICD-10-CM

## 2023-06-04 DIAGNOSIS — M5416 Radiculopathy, lumbar region: Secondary | ICD-10-CM

## 2023-06-04 DIAGNOSIS — L309 Dermatitis, unspecified: Secondary | ICD-10-CM

## 2023-06-05 ENCOUNTER — Ambulatory Visit: Payer: Medicare Other | Admitting: Student in an Organized Health Care Education/Training Program

## 2023-06-06 ENCOUNTER — Encounter: Payer: Self-pay | Admitting: Student in an Organized Health Care Education/Training Program

## 2023-06-06 ENCOUNTER — Ambulatory Visit: Payer: Medicare Other | Admitting: Student in an Organized Health Care Education/Training Program

## 2023-06-06 ENCOUNTER — Other Ambulatory Visit (HOSPITAL_COMMUNITY): Payer: Self-pay

## 2023-06-06 VITALS — BP 126/82 | HR 77 | Temp 98.1°F | Ht 64.0 in | Wt 131.2 lb

## 2023-06-06 DIAGNOSIS — F17200 Nicotine dependence, unspecified, uncomplicated: Secondary | ICD-10-CM

## 2023-06-06 DIAGNOSIS — J432 Centrilobular emphysema: Secondary | ICD-10-CM | POA: Diagnosis not present

## 2023-06-06 DIAGNOSIS — J9611 Chronic respiratory failure with hypoxia: Secondary | ICD-10-CM | POA: Diagnosis not present

## 2023-06-06 MED ORDER — STIOLTO RESPIMAT 2.5-2.5 MCG/ACT IN AERS
2.0000 | INHALATION_SPRAY | Freq: Every day | RESPIRATORY_TRACT | 12 refills | Status: DC
  Filled 2023-06-06: qty 4, 30d supply, fill #0

## 2023-06-06 NOTE — Progress Notes (Signed)
 Assessment & Plan:   #Centrilobular emphysema #Chronic Hypoxic Respiratory Failure  His chest CT shows emphysema (low-dose CT for lung cancer screening) which I have personally reviewed.  He is short of breath with exertion and is hypoxic with oxygen saturations dropping to the mid 80s with any exertion.  We had initiated him on oxygen therapy but he is currently electing not to use it with exertion or at rest.  I have counseled him on the importance of using the oxygen with exertion especially given how hypoxic he has been.  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  He was unable to get his Anoro Ellipta  and today we will attempt to represcribe him a different inhaler (Stiolto). I will re-order PFT's today and we will attempt to have them scheduled before his next visit.  - Tiotropium Bromide-Olodaterol (STIOLTO RESPIMAT) 2.5-2.5 MCG/ACT AERS; Inhale 2 puffs into the lungs daily.  Dispense: 6 each; Refill: 12 - Pulmonary Function Test; Future  #Tobacco Use Disorder  Again counseled the patient on the importance of smoking cessation given his underlying lung disease as well as hypoxia.  He was previously provided with nicotine  cessation products but continues to smoke.  Return in about 3 months (around 09/06/2023).  I spent 32 minutes caring for this patient today, including preparing to see the patient, obtaining a medical history , reviewing a separately obtained history, performing a medically appropriate examination and/or evaluation, counseling and educating the patient/family/caregiver, ordering medications, tests, or procedures, and documenting clinical information in the electronic health record  Vergia Glasgow, MD [facility]   End of visit medications:  Meds ordered this encounter  Medications   Tiotropium Bromide-Olodaterol (STIOLTO RESPIMAT) 2.5-2.5  MCG/ACT AERS    Sig: Inhale 2 puffs into the lungs daily.    Dispense:  6 each    Refill:  12     Current Outpatient Medications:    amLODipine  (NORVASC ) 10 MG tablet, Take 1 tablet (10 mg total) by mouth daily., Disp: 100 tablet, Rfl: 2   aspirin  EC 81 MG tablet, Take 81 mg by mouth daily as needed for mild pain. , Disp: , Rfl:    atorvastatin  (LIPITOR) 40 MG tablet, Take 1 tablet (40 mg total) by mouth daily., Disp: 90 tablet, Rfl: 3   Cholecalciferol (VITAMIN D3) 50 MCG (2000 UT) TABS, Take by mouth., Disp: , Rfl:    diphenhydrAMINE  (BENADRYL ) 25 MG tablet, Take 25 mg by mouth every 6 (six) hours as needed., Disp: , Rfl:    fexofenadine  (ALLEGRA ) 180 MG tablet, Take 1 tablet (180 mg total) by mouth daily., Disp: 30 tablet, Rfl: 5   fluticasone  (FLONASE ) 50 MCG/ACT nasal spray, Place 2 sprays into both nostrils daily., Disp: 16 g, Rfl: 6   gabapentin  (NEURONTIN ) 300 MG capsule, TAKE 3 CAPSULES BY MOUTH TWICE  DAILY, Disp: 600 capsule, Rfl: 2   glipiZIDE  (GLUCOTROL ) 5 MG tablet, TAKE 1 TABLET BY MOUTH TWICE  DAILY BEFORE A MEAL, Disp: 200 tablet, Rfl: 2   HYDROcodone -acetaminophen  (NORCO/VICODIN) 5-325 MG tablet, Take 1 tablet by mouth every 6 (six) hours as needed for moderate pain (pain score 4-6)., Disp: 20 tablet, Rfl: 0   metFORMIN  (GLUCOPHAGE ) 1000 MG tablet, Take 1 tablet (1,000 mg total) by mouth 2 (two) times daily with a meal., Disp: 200 tablet, Rfl: 2   sildenafil  (VIAGRA ) 100 MG tablet, Take 1 tablet (100 mg total) by  mouth as needed 1hr prior to sexual activities, Disp: 30 tablet, Rfl: 3   Tiotropium Bromide-Olodaterol (STIOLTO RESPIMAT) 2.5-2.5 MCG/ACT AERS, Inhale 2 puffs into the lungs daily., Disp: 6 each, Rfl: 12   tiZANidine  (ZANAFLEX ) 2 MG tablet, TAKE 1 TABLET BY MOUTH EVERY 8  HOURS AS NEEDED FOR MUSCLE  SPASM(S), Disp: 15 tablet, Rfl: 0   triamcinolone  cream (KENALOG ) 0.1 %, APPLY TOPICALLY TWICE DAILY AS  NEEDED, Disp: 45 g, Rfl: 0   Subjective:   PATIENT ID:  Alexander [name] GENDER: male DOB: July 17, 1945, MRN: [id number]  Chief Complaint  Patient presents with   Follow-up    No SOB, wheezing or cough.    HPI  Patient is a pleasant 78 year old male with a past medical history of emphysema and cigarette use presenting to clinic for follow-up.  He is at baseline and does not feel any worsening in his symptoms.  He is not short of breath nor is he experiencing any wheeze or cough.  He has had some issues using his oxygen concentrator.  Specifically, it has been turning off secondary to being plugged into a surge protector.  He has not been using his oxygen with exertion but does report using it nocturnally.  He has not had any fevers or chills, no chest pain, no tightness, no recent exacerbations.  He did present to our clinic for a 6-minute walk test where he was noted to be hypoxic down to 85%.  He was not able to get the inhaler we prescribed him (Anoro Ellipta ) secondary to cost.  He was unable to get his PFTs scheduled.  He continues to smoke cigarettes.  During our initial visit in February 2025, he reported minimal symptoms.  They at that time reported cough, shortness of breath, and a wheeze of a few weeks in duration prior to presentation.  He had also experienced exertional dyspnea.  He was seen by his primary care physician where he was noted to be rhonchorous with desaturation.  He was prescribed prednisone  and an x-ray.   He was previously seen by my colleague Dr. Dionicia Frater in 2020 where PFTs were ordered.  Chantix  and nicotine  lozenges were attempted, but he felt very ill with the Chantix  and discontinued it.  He is enrolled in our lung cancer screening program with the most recent CT being in August 2024.  He has had a history of prostate cancer status post prostatectomy.   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  He did have exposure to dust on his job recently while he was attempting to cut cement.  He feels that this somewhat  contributed to his symptoms.  He smokes cigarettes, having started at the age of 56.  He smoked 1 pack a day for most of his life.  He has around 60 pack years of smoking history.  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  No Financial planner.  He is originally from [place]  and has lived here most of his life.  Ancillary information including prior medications, full medical/surgical/family/social histories, and PFTs (when available) are listed below and have been reviewed.   Review of Systems  Constitutional:  Negative for chills, fever, malaise/fatigue and weight loss.  Respiratory:  Positive for cough, sputum production and shortness of breath. Negative for hemoptysis and wheezing.   Cardiovascular:  Negative for chest pain.     Objective:   Vitals:   06/06/23 0911  BP: 126/82  Pulse: 77  Temp: 98.1 F (36.7 C)  SpO2: (!) 89%  Weight: 131 lb 3.2 oz (59.5 kg)  Height: 5\' 4"  (1.626 m)   (!) 89% on RA  BMI Readings from Last 3 Encounters:  06/06/23 22.52 kg/m  05/19/23 22.66 kg/m  05/07/23 21.97 kg/m   Wt Readings from Last 3 Encounters:  06/06/23 131 lb 3.2 oz (59.5 kg)  05/19/23 132 lb (59.9 kg)  05/07/23 132 lb (59.9 kg)    Physical Exam Constitutional:      Appearance: Normal appearance. He is not ill-appearing.  Cardiovascular:     Rate and Rhythm: Normal rate and regular rhythm.     Pulses: Normal pulses.     Heart sounds: Normal heart sounds.  Pulmonary:     Effort: Pulmonary effort is normal.     Breath sounds: Normal breath sounds. No wheezing, rhonchi or rales.  Abdominal:     Palpations: Abdomen is soft.  Neurological:     General: No focal deficit present.     Mental Status: He is alert. Mental status is at baseline.       Ancillary Information    Past Medical History:  Diagnosis Date   Angio-edema    CAD (coronary artery disease)    Per [facility] New Patient Packet    Carpal tunnel syndrome    Per [facility] New Patient Packet     Diabetes mellitus without complication (HCC)    Hypertension    Neuromuscular disorder (HCC)    Peripheral arterial disease (HCC) 11/14/2016   non-occlusive, asymptomatic, seen by vascular surgery Dr. Nolene Baumgarten who rec repeated ABIs annually   Prostate cancer [facility])      Family History  Problem Relation Age of Onset   Diabetes Mother    Depression Father    Hypertension Father    Diabetes Father    Thyroid  disease Father    Hypertension Sister    Diabetes Sister    Congestive Heart Failure Sister    Kidney failure Sister    Diabetes Sister    Lung cancer Brother    Drug abuse Brother    Breast cancer Neg Hx    Prostate cancer Neg Hx    Colon cancer Neg Hx      Past Surgical History:  Procedure Laterality Date   CATARACT EXTRACTION Right 08/23/2020   COLON SURGERY     "locked bowel" fixed   COLONOSCOPY  2019   HERNIA REPAIR     LEFT HEART CATHETERIZATION WITH CORONARY ANGIOGRAM N/A 09/15/2013   Procedure: LEFT HEART CATHETERIZATION WITH CORONARY ANGIOGRAM;  Surgeon: Peter M Swaziland, MD;  Location: [facility] CATH LAB;  Service: Cardiovascular;  Laterality: N/A;   PROSTATE SURGERY  2005    Social History   Socioeconomic History   Marital status: Widowed    Spouse name: Not on file   Number of children: 2   Years of education: 9   Highest education level: 12th grade  Occupational History   Occupation: retired    Comment: Ecologist  Tobacco Use   Smoking status: Every Day    Types: Cigarettes   Smokeless tobacco: Never   Tobacco comments:    Smokes 0.75 PPD- khj 06/06/2023    Started smoking at 78 years old    Smoked 1 PDD at his heaviest.  Vaping Use   Vaping status: Never Used  Substance and Sexual Activity   Alcohol use: No   Drug use: No   Sexual activity: Yes  Other Topics Concern   Not on file  Social History Narrative  Patient is single and his daughter lives with him.   Patient has two children.   Patient has a 9th grade education.   Patient works in  Holiday representative (part-time).   Patient drinks one to two cups of coffee daily.   Patient is right-handed.         Per [facility] New Patient Packet:   Diet:Left blank      Caffeine:Yes      Married, if yes what year: Widowed      Do you live in a house, apartment, assisted living, condo, trailer, ect: House, 3 persons      Is it one or more stories: One      Pets: No      Current/Past profession: Recruitment consultant      Highest level or education completed:       Exercise:         No         Type and how often:          Living Will: No   DNR: No   POA/HPOA: No      Functional Status:   Do you have difficulty bathing or dressing yourself? No   Do you have difficulty preparing food or eating? No   Do you have difficulty managing your medications? No   Do you have difficulty managing your finances? No   Do you have difficulty affording your medications? No   Social Drivers of Corporate investment banker Strain: Low Risk  (02/27/2023)   Overall Financial Resource Strain (CARDIA)    Difficulty of Paying Living Expenses: Not hard at all  Food Insecurity: No Food Insecurity (02/27/2023)   Hunger Vital Sign    Worried About Running Out of Food in the Last Year: Never true    Ran Out of Food in the Last Year: Never true  Transportation Needs: No Transportation Needs (02/27/2023)   PRAPARE - Administrator, Civil Service (Medical): No    Lack of Transportation (Non-Medical): No  Physical Activity: Unknown (02/27/2023)   Exercise Vital Sign    Days of Exercise per Week: Patient declined    Minutes of Exercise per Session: Not on file  Stress: No Stress Concern Present (02/27/2023)   [employer] of Occupational Health - Occupational Stress Questionnaire    Feeling of Stress : Not at all  Social Connections: Moderately Isolated (02/27/2023)   Social Connection and Isolation Panel [NHANES]    Frequency of Communication with Friends and Family: More than three times a week    Frequency of  Social Gatherings with Friends and Family: More than three times a week    Attends Religious Services: 1 to 4 times per year    Active Member of [employer] or Organizations: No    Attends Banker Meetings: Never    Marital Status: Widowed  Intimate Partner Violence: Not At Risk (08/08/2022)   Humiliation, Afraid, Rape, and Kick questionnaire    Fear of Current or Ex-Partner: No    Emotionally Abused: No    Physically Abused: No    Sexually Abused: No     Allergies  Allergen Reactions   Lisinopril  Swelling    angioedema     CBC    Component Value Date/Time   WBC 6.4 02/27/2023 1447   RBC 4.36 02/27/2023 1447   HGB 13.4 02/27/2023 1447   HGB 13.5 08/10/2018 1007   HCT 40.7 02/27/2023 1447   HCT 41.2 08/10/2018 1007   PLT  306.0 02/27/2023 1447   PLT 230 08/10/2018 1007   MCV 93.3 02/27/2023 1447   MCV 90 08/10/2018 1007   MCH 30.3 05/10/2020 1043   MCHC 32.8 02/27/2023 1447   RDW 14.0 02/27/2023 1447   RDW 14.4 08/10/2018 1007   LYMPHSABS 1,061 05/10/2020 1043   LYMPHSABS 2.6 08/10/2018 1007   MONOABS 0.5 06/16/2009 1945   EOSABS 191 05/10/2020 1043   EOSABS 0.2 08/10/2018 1007   BASOSABS 58 05/10/2020 1043   BASOSABS 0.1 08/10/2018 1007    Pulmonary Functions Testing Results:     No data to display          Outpatient Medications Prior to Visit  Medication Sig Dispense Refill   amLODipine  (NORVASC ) 10 MG tablet Take 1 tablet (10 mg total) by mouth daily. 100 tablet 2   aspirin  EC 81 MG tablet Take 81 mg by mouth daily as needed for mild pain.      atorvastatin  (LIPITOR) 40 MG tablet Take 1 tablet (40 mg total) by mouth daily. 90 tablet 3   Cholecalciferol (VITAMIN D3) 50 MCG (2000 UT) TABS Take by mouth.     diphenhydrAMINE  (BENADRYL ) 25 MG tablet Take 25 mg by mouth every 6 (six) hours as needed.     fexofenadine  (ALLEGRA ) 180 MG tablet Take 1 tablet (180 mg total) by mouth daily. 30 tablet 5   fluticasone  (FLONASE ) 50 MCG/ACT nasal spray Place 2  sprays into both nostrils daily. 16 g 6   gabapentin  (NEURONTIN ) 300 MG capsule TAKE 3 CAPSULES BY MOUTH TWICE  DAILY 600 capsule 2   glipiZIDE  (GLUCOTROL ) 5 MG tablet TAKE 1 TABLET BY MOUTH TWICE  DAILY BEFORE A MEAL 200 tablet 2   HYDROcodone -acetaminophen  (NORCO/VICODIN) 5-325 MG tablet Take 1 tablet by mouth every 6 (six) hours as needed for moderate pain (pain score 4-6). 20 tablet 0   metFORMIN  (GLUCOPHAGE ) 1000 MG tablet Take 1 tablet (1,000 mg total) by mouth 2 (two) times daily with a meal. 200 tablet 2   sildenafil  (VIAGRA ) 100 MG tablet Take 1 tablet (100 mg total) by mouth as needed 1hr prior to sexual activities 30 tablet 3   tiZANidine  (ZANAFLEX ) 2 MG tablet TAKE 1 TABLET BY MOUTH EVERY 8  HOURS AS NEEDED FOR MUSCLE  SPASM(S) 15 tablet 0   triamcinolone  cream (KENALOG ) 0.1 % APPLY TOPICALLY TWICE DAILY AS  NEEDED 45 g 0   umeclidinium-vilanterol (ANORO ELLIPTA ) 62.5-25 MCG/ACT AEPB Inhale 1 puff into the lungs daily. (Patient not taking: Reported on 06/06/2023) 60 each 11   No facility-administered medications prior to visit.

## 2023-06-12 DIAGNOSIS — R809 Proteinuria, unspecified: Secondary | ICD-10-CM | POA: Diagnosis not present

## 2023-06-12 DIAGNOSIS — F17200 Nicotine dependence, unspecified, uncomplicated: Secondary | ICD-10-CM | POA: Diagnosis not present

## 2023-06-12 DIAGNOSIS — E1165 Type 2 diabetes mellitus with hyperglycemia: Secondary | ICD-10-CM | POA: Diagnosis not present

## 2023-06-12 DIAGNOSIS — M48062 Spinal stenosis, lumbar region with neurogenic claudication: Secondary | ICD-10-CM | POA: Diagnosis not present

## 2023-06-12 DIAGNOSIS — R0902 Hypoxemia: Secondary | ICD-10-CM | POA: Diagnosis not present

## 2023-06-12 DIAGNOSIS — I10 Essential (primary) hypertension: Secondary | ICD-10-CM | POA: Diagnosis not present

## 2023-06-12 DIAGNOSIS — J439 Emphysema, unspecified: Secondary | ICD-10-CM | POA: Diagnosis not present

## 2023-06-18 ENCOUNTER — Other Ambulatory Visit (HOSPITAL_COMMUNITY): Payer: Self-pay

## 2023-06-22 DIAGNOSIS — J432 Centrilobular emphysema: Secondary | ICD-10-CM | POA: Diagnosis not present

## 2023-07-09 ENCOUNTER — Ambulatory Visit: Admitting: Family Medicine

## 2023-07-10 ENCOUNTER — Other Ambulatory Visit: Payer: Self-pay | Admitting: Vascular Surgery

## 2023-07-10 ENCOUNTER — Ambulatory Visit: Admitting: Family Medicine

## 2023-07-10 DIAGNOSIS — I779 Disorder of arteries and arterioles, unspecified: Secondary | ICD-10-CM

## 2023-07-10 DIAGNOSIS — I739 Peripheral vascular disease, unspecified: Secondary | ICD-10-CM

## 2023-07-22 DIAGNOSIS — J432 Centrilobular emphysema: Secondary | ICD-10-CM | POA: Diagnosis not present

## 2023-07-23 ENCOUNTER — Ambulatory Visit (HOSPITAL_COMMUNITY)
Admission: RE | Admit: 2023-07-23 | Discharge: 2023-07-23 | Disposition: A | Discharge status: Home / Self Care | Source: Ambulatory Visit | Attending: Vascular Surgery | Admitting: Vascular Surgery

## 2023-07-23 DIAGNOSIS — I779 Disorder of arteries and arterioles, unspecified: Secondary | ICD-10-CM

## 2023-07-23 DIAGNOSIS — I739 Peripheral vascular disease, unspecified: Secondary | ICD-10-CM

## 2023-07-23 LAB — VAS US ABI WITH/WO TBI
Left ABI: 0.82
Right ABI: 0.63

## 2023-07-28 DIAGNOSIS — M48062 Spinal stenosis, lumbar region with neurogenic claudication: Secondary | ICD-10-CM | POA: Diagnosis not present

## 2023-07-28 NOTE — Progress Notes (Unsigned)
 [facility] [place]  ASSESSMENT / PLAN: Alexander [name] is a 78 y.o. male with atherosclerosis of native arteries of bilateral lower extremities causing no symptoms.  Patient counseled patients with asymptomatic peripheral arterial disease or claudication have a 1-2% risk of developing chronic limb threatening ischemia, but a 15-30% risk of mortality in the next 5 years. Intervention should only be considered for medically optimized patients with disabling symptoms.   Recommend the following which can slow the progression of atherosclerosis and reduce the risk of major adverse cardiac / limb events:  Complete cessation from all tobacco products. Blood glucose control with goal A1c < 7%. Blood pressure control with goal blood pressure < 140/90 mmHg. Lipid reduction therapy with goal LDL-C <100 mg/dL (<29 if symptomatic from PAD).  Aspirin  81mg  PO QD.  Atorvastatin  40-80mg  PO QD (or other high intensity statin therapy).  Follow-up yearly with ankle-brachial index for surveillance.  CHIEF COMPLAINT: Atherosclerosis identified on CT scan.  HISTORY OF PRESENT ILLNESS: Alexander [name] is a 78 y.o. male for to clinic for evaluation of atherosclerosis in the lower extremities incidentally discovered on recent CT scan.  The patient is asymptomatic from a peripheral arterial disease standpoint.  He is active and continues to work.  He reports he is unrestricted in his ability to walk.  He does not describe claudication type symptoms.  He has no symptoms of ischemic rest pain.  He has no ulcers about his feet.  07/23/22: Patient returns for clinic. He had a CT angiogram. He remains asymptomatic. He denies any symptoms of claudication; rest pain; or ischemic ulceration.   Past Medical History:  Diagnosis Date   Angio-edema    CAD (coronary artery disease)    Per [facility] New Patient Packet    Carpal tunnel syndrome    Per [facility] New Patient Packet    Diabetes mellitus without  complication (HCC)    Hypertension    Neuromuscular disorder (HCC)    Peripheral arterial disease (HCC) 11/14/2016   non-occlusive, asymptomatic, seen by vascular surgery Dr. Harvey who rec repeated ABIs annually   Prostate cancer [facility])     Past Surgical History:  Procedure Laterality Date   CATARACT EXTRACTION Right 08/23/2020   COLON SURGERY     locked bowel fixed   COLONOSCOPY  2019   HERNIA REPAIR     LEFT HEART CATHETERIZATION WITH CORONARY ANGIOGRAM N/A 09/15/2013   Procedure: LEFT HEART CATHETERIZATION WITH CORONARY ANGIOGRAM;  Surgeon: Peter M Swaziland, MD;  Location: [facility] CATH LAB;  Service: Cardiovascular;  Laterality: N/A;   PROSTATE SURGERY  2005    Family History  Problem Relation Age of Onset   Diabetes Mother    Depression Father    Hypertension Father    Diabetes Father    Thyroid  disease Father    Hypertension Sister    Diabetes Sister    Congestive Heart Failure Sister    Kidney failure Sister    Diabetes Sister    Lung cancer Brother    Drug abuse Brother    Breast cancer Neg Hx    Prostate cancer Neg Hx    Colon cancer Neg Hx     Social History   Socioeconomic History   Marital status: Widowed    Spouse name: Not on file   Number of children: 2   Years of education: 9   Highest education level: 12th grade  Occupational History   Occupation: retired    Comment: Ecologist  Tobacco Use  Smoking status: Every Day    Types: Cigarettes   Smokeless tobacco: Never   Tobacco comments:    Smokes 0.75 PPD- khj 06/06/2023    Started smoking at 78 years old    Smoked 1 PDD at his heaviest.  Vaping Use   Vaping status: Never Used  Substance and Sexual Activity   Alcohol use: No   Drug use: No   Sexual activity: Yes  Other Topics Concern   Not on file  Social History Narrative   Patient is single and his daughter lives with him.   Patient has two children.   Patient has a 9th grade education.   Patient works in [employer] (part-time).    Patient drinks one to two cups of coffee daily.   Patient is right-handed.         Per [facility] New Patient Packet:   Diet:Left blank      Caffeine:Yes      Married, if yes what year: Widowed      Do you live in a house, apartment, assisted living, condo, trailer, ect: House, 3 persons      Is it one or more stories: One      Pets: No      Current/Past profession: Recruitment consultant      Highest level or education completed:       Exercise:         No         Type and how often:          Living Will: No   DNR: No   POA/HPOA: No      Functional Status:   Do you have difficulty bathing or dressing yourself? No   Do you have difficulty preparing food or eating? No   Do you have difficulty managing your medications? No   Do you have difficulty managing your finances? No   Do you have difficulty affording your medications? No   Social Drivers of Corporate investment banker Strain: Low Risk  (02/27/2023)   Overall Financial Resource Strain (CARDIA)    Difficulty of Paying Living Expenses: Not hard at all  Food Insecurity: No Food Insecurity (02/27/2023)   Hunger Vital Sign    Worried About Running Out of Food in the Last Year: Never true    Ran Out of Food in the Last Year: Never true  Transportation Needs: No Transportation Needs (02/27/2023)   PRAPARE - Administrator, Civil Service (Medical): No    Lack of Transportation (Non-Medical): No  Physical Activity: Unknown (02/27/2023)   Exercise Vital Sign    Days of Exercise per Week: Patient declined    Minutes of Exercise per Session: Not on file  Stress: No Stress Concern Present (02/27/2023)   [employer] of Occupational Health - Occupational Stress Questionnaire    Feeling of Stress : Not at all  Social Connections: Moderately Isolated (02/27/2023)   Social Connection and Isolation Panel    Frequency of Communication with Friends and Family: More than three times a week    Frequency of Social Gatherings with Friends and  Family: More than three times a week    Attends Religious Services: 1 to 4 times per year    Active Member of [employer] or Organizations: No    Attends Banker Meetings: Never    Marital Status: Widowed  Intimate Partner Violence: Not At Risk (08/08/2022)   Humiliation, Afraid, Rape, and Kick questionnaire    Fear of Current  or Ex-Partner: No    Emotionally Abused: No    Physically Abused: No    Sexually Abused: No    Allergies  Allergen Reactions   Lisinopril  Swelling    angioedema    Current Outpatient Medications  Medication Sig Dispense Refill   amLODipine  (NORVASC ) 10 MG tablet Take 1 tablet (10 mg total) by mouth daily. 100 tablet 2   aspirin  EC 81 MG tablet Take 81 mg by mouth daily as needed for mild pain.      atorvastatin  (LIPITOR) 40 MG tablet Take 1 tablet (40 mg total) by mouth daily. 90 tablet 3   Cholecalciferol (VITAMIN D3) 50 MCG (2000 UT) TABS Take by mouth.     diphenhydrAMINE  (BENADRYL ) 25 MG tablet Take 25 mg by mouth every 6 (six) hours as needed.     fexofenadine  (ALLEGRA ) 180 MG tablet Take 1 tablet (180 mg total) by mouth daily. 30 tablet 5   fluticasone  (FLONASE ) 50 MCG/ACT nasal spray Place 2 sprays into both nostrils daily. 16 g 6   gabapentin  (NEURONTIN ) 300 MG capsule TAKE 3 CAPSULES BY MOUTH TWICE  DAILY 600 capsule 2   glipiZIDE  (GLUCOTROL ) 5 MG tablet TAKE 1 TABLET BY MOUTH TWICE  DAILY BEFORE A MEAL 200 tablet 2   HYDROcodone -acetaminophen  (NORCO/VICODIN) 5-325 MG tablet Take 1 tablet by mouth every 6 (six) hours as needed for moderate pain (pain score 4-6). 20 tablet 0   metFORMIN  (GLUCOPHAGE ) 1000 MG tablet Take 1 tablet (1,000 mg total) by mouth 2 (two) times daily with a meal. 200 tablet 2   sildenafil  (VIAGRA ) 100 MG tablet Take 1 tablet (100 mg total) by mouth as needed 1hr prior to sexual activities 30 tablet 3   Tiotropium Bromide-Olodaterol (STIOLTO RESPIMAT ) 2.5-2.5 MCG/ACT AERS Inhale 2 puffs into the lungs daily. 4 g 12    tiZANidine  (ZANAFLEX ) 2 MG tablet TAKE 1 TABLET BY MOUTH EVERY 8  HOURS AS NEEDED FOR MUSCLE  SPASM(S) 15 tablet 0   triamcinolone  cream (KENALOG ) 0.1 % APPLY TOPICALLY TWICE DAILY AS  NEEDED 45 g 0   No current facility-administered medications for this visit.    PHYSICAL EXAM There were no vitals filed for this visit.   Elderly gentleman in no acute distress Regular rate and rhythm Unlabored breathing No palpable pedal pulses Feet are warm   PERTINENT LABORATORY AND RADIOLOGIC DATA  Most recent CBC    Latest Ref Rng & Units 02/27/2023    2:47 PM 09/27/2021   11:19 AM 05/10/2020   10:43 AM  CBC  WBC 4.0 - 10.5 K/uL 6.4  6.1  5.8   Hemoglobin 13.0 - 17.0 g/dL 86.5  86.9  86.0   Hematocrit 39.0 - 52.0 % 40.7  40.1  42.3   Platelets 150.0 - 400.0 K/uL 306.0  245.0  265      Most recent CMP    Latest Ref Rng & Units 05/07/2023   10:54 AM 02/27/2023    2:47 PM 11/15/2022   10:19 AM  CMP  Glucose 70 - 99 mg/dL 879  898  59   BUN 6 - 23 mg/dL 15  16  14    Creatinine 0.40 - 1.50 mg/dL 8.88  8.84  9.01   Sodium 135 - 145 mEq/L 137  140  140   Potassium 3.5 - 5.1 mEq/L 4.3  4.6  4.5   Chloride 96 - 112 mEq/L 103  104  105   CO2 19 - 32 mEq/L 26  25  27  Calcium  8.4 - 10.5 mg/dL 9.9  9.3  89.6   Total Protein 6.0 - 8.3 g/dL 7.6   8.3   Total Bilirubin 0.2 - 1.2 mg/dL 0.4   0.5   Alkaline Phos 39 - 117 U/L 102   107   AST 0 - 37 U/L 17   18   ALT 0 - 53 U/L 11   13     Renal function CrCl cannot be calculated (Patient's most recent lab result is older than the maximum 21 days allowed.).  Hgb A1c MFr Bld (%)  Date Value  05/07/2023 7.5 (H)    LDL Cholesterol (Calc)  Date Value Ref Range Status  05/10/2020 44 mg/dL (calc) Final    Comment:    Reference range: <100 . Desirable range <100 mg/dL for primary prevention;   <70 mg/dL for patients with CHD or diabetic patients  with > or = 2 CHD risk factors. SABRA LDL-C is now calculated using the Martin-Hopkins   calculation, which is a validated novel method providing  better accuracy than the Friedewald equation in the  estimation of LDL-C.  Gladis APPLETHWAITE et al. SANDREA. 7986;689(80): 2061-2068  (http://education.[facility].com/faq/FAQ164)    LDL Cholesterol  Date Value Ref Range Status  05/07/2023 43 0 - 99 mg/dL Final    +-------+-----------+-----------+------------+------------+  ABI/TBIToday's ABIToday's TBIPrevious ABIPrevious TBI  +-------+-----------+-----------+------------+------------+  Right  0.49       0.21       0.68        0.40          +-------+-----------+-----------+------------+------------+  Left   0.65       0.40       0.68        0.39          +-------+-----------+-----------+------------+------------+  CT angiogram 07/13/22 Bilateral SFA stenosis / occlusion. Runoff preserved.   Debby SAILOR. Magda, MD FACS [facility] Phone Number: [phone] 07/28/2023 8:58 AM   Total time spent on preparing this encounter including chart review, data review, collecting history, examining the patient, coordinating care for this established patient, 30 minutes.  Portions of this report may have been transcribed using voice recognition software.  Every effort has been made to ensure accuracy; however, inadvertent computerized transcription errors may still be present.

## 2023-07-29 ENCOUNTER — Encounter: Payer: Self-pay | Admitting: Vascular Surgery

## 2023-07-29 ENCOUNTER — Ambulatory Visit: Attending: Vascular Surgery | Admitting: Vascular Surgery

## 2023-07-29 VITALS — BP 146/79 | HR 81 | Temp 98.0°F | Ht 64.0 in | Wt 130.0 lb

## 2023-07-29 DIAGNOSIS — I739 Peripheral vascular disease, unspecified: Secondary | ICD-10-CM

## 2023-08-07 ENCOUNTER — Encounter: Payer: Self-pay | Admitting: Student in an Organized Health Care Education/Training Program

## 2023-08-07 ENCOUNTER — Telehealth: Payer: Self-pay

## 2023-08-07 ENCOUNTER — Other Ambulatory Visit (HOSPITAL_COMMUNITY): Payer: Self-pay

## 2023-08-07 NOTE — Telephone Encounter (Signed)
 No cheaper alternatives at this time. All options in this class are showing as $292.06 due to the patient having a deductible to meet. Once this has been met the plan is showing that the copay will drop down to $47.00

## 2023-08-13 ENCOUNTER — Ambulatory Visit (INDEPENDENT_AMBULATORY_CARE_PROVIDER_SITE_OTHER)

## 2023-08-13 VITALS — Ht 64.0 in | Wt 130.0 lb

## 2023-08-13 DIAGNOSIS — Z0000 Encounter for general adult medical examination without abnormal findings: Secondary | ICD-10-CM

## 2023-08-13 NOTE — Patient Instructions (Signed)
 Alexander [name] , Thank you for taking time out of your busy schedule to complete your Annual Wellness Visit with me. I enjoyed our conversation and look forward to speaking with you again next year. I, as well as your care team,  appreciate your ongoing commitment to your health goals. Please review the following plan we discussed and let me know if I can assist you in the future. Your Game plan/ To Do List    Referrals: If you haven't heard from the office you've been referred to, please reach out to them at the phone provided.   Follow up Visits: Next Medicare AWV with our clinical staff: 08/24/2024 at 3:10 p.m. phone visit with [facility]   Have you seen your provider in the last 6 months (3 months if uncontrolled diabetes)? Yes Next Office Visit with your provider: Patient's granddaughter will call to schedule  Clinician Recommendations:  Aim for 30 minutes of exercise or brisk walking, 6-8 glasses of water, and 5 servings of fruits and vegetables each day.       This is a list of the screening recommended for you and due dates:  Health Maintenance  Topic Date Due   Pneumococcal Vaccine for age over 51 (1 of 2 - PCV) Never done   Zoster (Shingles) Vaccine (1 of 2) Never done   COVID-19 Vaccine (4 - 2024-25 season) 09/22/2022   Colon Cancer Screening  05/31/2023   Screening for Lung Cancer  09/09/2023   Flu Shot  08/22/2023   Hemoglobin A1C  11/06/2023   Yearly kidney function blood test for diabetes  05/06/2024   Yearly kidney health urinalysis for diabetes  05/06/2024   Complete foot exam   05/18/2024   Eye exam for diabetics  05/26/2024   DTaP/Tdap/Td vaccine (2 - Tdap) 07/16/2024   Medicare Annual Wellness Visit  08/12/2024   Hepatitis C Screening  Completed   Hepatitis B Vaccine  Aged Out   HPV Vaccine  Aged Out   Meningitis B Vaccine  Aged Out    Advanced directives: (Copy Requested) Please bring a copy of your health care power of attorney and living will to the office to be added  to your chart at your convenience. You can mail to [facility] [address] [place], [place] [address] or email to [email or web address] Advance Care Planning is important because it:  [x]  Makes sure you receive the medical care that is consistent with your values, goals, and preferences  [x]  It provides guidance to your family and loved ones and reduces their decisional burden about whether or not they are making the right decisions based on your wishes.  Follow the link provided in your after visit summary or read over the paperwork we have mailed to you to help you started getting your Advance Directives in place. If you need assistance in completing these, please reach out to us  so that we can help you!  See attachments for Preventive Care and Fall Prevention Tips.

## 2023-08-13 NOTE — Progress Notes (Signed)
 Because this visit was a virtual/telehealth visit,  certain criteria was not obtained, such a blood pressure, CBG if applicable, and timed get up and go. Any medications not marked as taking were not mentioned during the medication reconciliation part of the visit. Any vitals not documented were not able to be obtained due to this being a telehealth visit or patient was unable to self-report a recent blood pressure reading due to a lack of equipment at home via telehealth. Vitals that have been documented are verbally provided by the patient.   Subjective:   Alexander [name] is a 78 y.o. who presents for a Medicare Wellness preventive visit.  As a reminder, Annual Wellness Visits don't include a physical exam, and some assessments may be limited, especially if this visit is performed virtually. We may recommend an in-person follow-up visit with your provider if needed.  Visit Complete: Virtual I connected with  Alexander [name] on 08/13/23 by a audio enabled telemedicine application and verified that I am speaking with the correct person using two identifiers.  Patient Location: Home  Provider Location: Home Office  I discussed the limitations of evaluation and management by telemedicine. The patient expressed understanding and agreed to proceed.  Vital Signs: Because this visit was a virtual/telehealth visit, some criteria may be missing or patient reported. Any vitals not documented were not able to be obtained and vitals that have been documented are patient reported.  VideoDeclined- This patient declined Librarian, academic. Therefore the visit was completed with audio only.  Persons Participating in Visit: Patient.  AWV Questionnaire: No: Patient Medicare AWV questionnaire was not completed prior to this visit.  Cardiac Risk Factors include: advanced age (>20men, >58 women);sedentary lifestyle;diabetes mellitus;dyslipidemia;family history of premature  cardiovascular disease;hypertension;male gender     Objective:    Today's Vitals   08/13/23 1014  Weight: 130 lb (59 kg)  Height: 5' 4 (1.626 m)  PainSc: 5   PainLoc: Foot   Body mass index is 22.31 kg/m.     08/13/2023   10:16 AM 08/08/2022    8:32 AM 08/01/2021    9:18 AM 07/02/2021    9:10 AM 06/22/2020    8:40 AM 06/06/2020    7:57 AM 05/10/2020    9:38 AM  Advanced Directives  Does Patient Have a Medical Advance Directive? Yes Yes Yes No No Yes No  Type of Estate agent of [place];Living will Healthcare Power of [place];Living will Healthcare Power of [place];Living will   Healthcare Power of Attorney   Does patient want to make changes to medical advance directive?     No - Patient declined No - Patient declined   Copy of Healthcare Power of Attorney in Chart? No - copy requested No - copy requested No - copy requested      Would patient like information on creating a medical advance directive?       Yes (MAU/Ambulatory/Procedural Areas - Information given)    Current Medications (verified) Outpatient Encounter Medications as of 08/13/2023  Medication Sig   amLODipine  (NORVASC ) 10 MG tablet Take 1 tablet (10 mg total) by mouth daily.   aspirin  EC 81 MG tablet Take 81 mg by mouth daily as needed for mild pain.    atorvastatin  (LIPITOR) 40 MG tablet Take 1 tablet (40 mg total) by mouth daily.   Cholecalciferol (VITAMIN D3) 50 MCG (2000 UT) TABS Take by mouth.   diphenhydrAMINE  (BENADRYL ) 25 MG tablet Take 25 mg by mouth every  6 (six) hours as needed.   fexofenadine  (ALLEGRA ) 180 MG tablet Take 1 tablet (180 mg total) by mouth daily.   fluticasone  (FLONASE ) 50 MCG/ACT nasal spray Place 2 sprays into both nostrils daily.   gabapentin  (NEURONTIN ) 300 MG capsule TAKE 3 CAPSULES BY MOUTH TWICE  DAILY   glipiZIDE  (GLUCOTROL ) 5 MG tablet TAKE 1 TABLET BY MOUTH TWICE  DAILY BEFORE A MEAL   HYDROcodone -acetaminophen  (NORCO/VICODIN) 5-325 MG tablet Take 1 tablet  by mouth every 6 (six) hours as needed for moderate pain (pain score 4-6).   metFORMIN  (GLUCOPHAGE ) 1000 MG tablet Take 1 tablet (1,000 mg total) by mouth 2 (two) times daily with a meal.   sildenafil  (VIAGRA ) 100 MG tablet Take 1 tablet (100 mg total) by mouth as needed 1hr prior to sexual activities   Tiotropium Bromide-Olodaterol (STIOLTO RESPIMAT ) 2.5-2.5 MCG/ACT AERS Inhale 2 puffs into the lungs daily.   tiZANidine  (ZANAFLEX ) 2 MG tablet TAKE 1 TABLET BY MOUTH EVERY 8  HOURS AS NEEDED FOR MUSCLE  SPASM(S)   triamcinolone  cream (KENALOG ) 0.1 % APPLY TOPICALLY TWICE DAILY AS  NEEDED   No facility-administered encounter medications on file as of 08/13/2023.    Allergies (verified) Lisinopril    History: Past Medical History:  Diagnosis Date   Angio-edema    CAD (coronary artery disease)    Per [facility] New Patient Packet    Carpal tunnel syndrome    Per [facility] New Patient Packet    Diabetes mellitus without complication (HCC)    Hypertension    Neuromuscular disorder (HCC)    Peripheral arterial disease (HCC) 11/14/2016   non-occlusive, asymptomatic, seen by vascular surgery Dr. Harvey who rec repeated ABIs annually   Prostate cancer [facility])    Past Surgical History:  Procedure Laterality Date   CATARACT EXTRACTION Right 08/23/2020   COLON SURGERY     locked bowel fixed   COLONOSCOPY  2019   HERNIA REPAIR     LEFT HEART CATHETERIZATION WITH CORONARY ANGIOGRAM N/A 09/15/2013   Procedure: LEFT HEART CATHETERIZATION WITH CORONARY ANGIOGRAM;  Surgeon: Peter M Swaziland, MD;  Location: [facility] CATH LAB;  Service: Cardiovascular;  Laterality: N/A;   PROSTATE SURGERY  2005   Family History  Problem Relation Age of Onset   Diabetes Mother    Depression Father    Hypertension Father    Diabetes Father    Thyroid  disease Father    Hypertension Sister    Diabetes Sister    Congestive Heart Failure Sister    Kidney failure Sister    Diabetes Sister    Lung cancer Brother    Drug abuse  Brother    Breast cancer Neg Hx    Prostate cancer Neg Hx    Colon cancer Neg Hx    Social History   Socioeconomic History   Marital status: Widowed    Spouse name: Not on file   Number of children: 2   Years of education: 9   Highest education level: 12th grade  Occupational History   Occupation: retired    Comment: Ecologist  Tobacco Use   Smoking status: Every Day    Current packs/day: 0.50    Average packs/day: 0.5 packs/day for 55.0 years (27.5 ttl pk-yrs)    Types: Cigarettes   Smokeless tobacco: Never   Tobacco comments:    Smokes 0.75 PPD- khj 06/06/2023    Started smoking at 78 years old    Smoked 1 PDD at his heaviest.  Vaping Use   Vaping status:  Never Used  Substance and Sexual Activity   Alcohol use: No   Drug use: No   Sexual activity: Yes  Other Topics Concern   Not on file  Social History Narrative   Patient is single and his daughter lives with him.   Patient has two children.   Patient has a 9th grade education.   Patient works in [employer] (part-time).   Patient drinks one to two cups of coffee daily.   Patient is right-handed.         Per [facility] New Patient Packet:   Diet:Left blank      Caffeine:Yes      Married, if yes what year: Widowed      Do you live in a house, apartment, assisted living, condo, trailer, ect: House, 3 persons      Is it one or more stories: One      Pets: No      Current/Past profession: Recruitment consultant      Highest level or education completed:       Exercise:         No         Type and how often:          Living Will: No   DNR: No   POA/HPOA: No      Functional Status:   Do you have difficulty bathing or dressing yourself? No   Do you have difficulty preparing food or eating? No   Do you have difficulty managing your medications? No   Do you have difficulty managing your finances? No   Do you have difficulty affording your medications? No   Social Drivers of Corporate investment banker Strain: Low  Risk  (08/13/2023)   Overall Financial Resource Strain (CARDIA)    Difficulty of Paying Living Expenses: Not hard at all  Food Insecurity: No Food Insecurity (08/13/2023)   Hunger Vital Sign    Worried About Running Out of Food in the Last Year: Never true    Ran Out of Food in the Last Year: Never true  Transportation Needs: No Transportation Needs (08/13/2023)   PRAPARE - Administrator, Civil Service (Medical): No    Lack of Transportation (Non-Medical): No  Physical Activity: Unknown (08/13/2023)   Exercise Vital Sign    Days of Exercise per Week: Patient declined    Minutes of Exercise per Session: 30 min  Stress: No Stress Concern Present (08/13/2023)   [employer] of Occupational Health - Occupational Stress Questionnaire    Feeling of Stress: Not at all  Social Connections: Moderately Isolated (08/13/2023)   Social Connection and Isolation Panel    Frequency of Communication with Friends and Family: More than three times a week    Frequency of Social Gatherings with Friends and Family: More than three times a week    Attends Religious Services: 1 to 4 times per year    Active Member of [employer] or Organizations: No    Attends Banker Meetings: Never    Marital Status: Widowed    Tobacco Counseling Ready to quit: Not Answered Counseling given: Not Answered Tobacco comments: Smokes 0.75 PPD- khj 06/06/2023 Started smoking at 78 years old Smoked 1 PDD at his heaviest.    Clinical Intake:  Pre-visit preparation completed: Yes  Pain : 0-10 Pain Score: 5  Pain Type: Chronic pain Pain Location: Foot Pain Orientation: Right Pain Descriptors / Indicators: Throbbing, Constant, Discomfort Pain Onset: More than a month ago Pain  Frequency: Constant Pain Relieving Factors: EPSOM SALT, ALCOHOL, TOPICAL OINTMENT, PAIN PATCHED  Pain Relieving Factors: EPSOM SALT, ALCOHOL, TOPICAL OINTMENT, PAIN PATCHED  BMI - recorded: 22.31 Nutritional Status: BMI of  19-24  Normal Nutritional Risks: None Diabetes: Yes CBG done?: No Did pt. bring in CBG monitor from home?: No  Lab Results  Component Value Date   HGBA1C 7.5 (H) 05/07/2023   HGBA1C 6.9 (H) 11/15/2022   HGBA1C 7.7 (H) 08/15/2022     How often do you need to have someone help you when you read instructions, pamphlets, or other written materials from your doctor or pharmacy?: 1 - Never What is the last grade level you completed in school?: 9TH  Interpreter Needed?: No  Information entered by :: Roz Fuller, LPN.   Activities of Daily Living     08/13/2023   10:17 AM  In your present state of health, do you have any difficulty performing the following activities:  Hearing? 0  Vision? 0  Difficulty concentrating or making decisions? 0  Walking or climbing stairs? 0  Dressing or bathing? 0  Doing errands, shopping? 0  Preparing Food and eating ? N  Using the Toilet? N  In the past six months, have you accidently leaked urine? Y  Do you have problems with loss of bowel control? Y  Managing your Medications? N  Managing your Finances? N  Housekeeping or managing your Housekeeping? N    Patient Care Team: Levora Reyes SAUNDERS, MD as PCP - General (Family Medicine) Rollin Dover, MD as Consulting Physician (Gastroenterology) Shona Layman BROCKS, MD as Referring Physician (Urology) Grayce Buddle, RN Nurse Navigator as Registered Nurse (Medical Oncology) Waylan Cain, MD as Consulting Physician (Ophthalmology) Letha Cancer, MD as Consulting Physician (Physical Medicine and Rehabilitation) Tat, Asberry RAMAN, DO as Consulting Physician (Neurology) Isadora Hose, MD as Consulting Physician (Pulmonary Disease)  I have updated your Care Teams any recent Medical Services you may have received from other providers in the past year.     Assessment:   This is a routine wellness examination for Sem.  Hearing/Vision screen Hearing Screening - Comments:: Denies hearing  difficulties.  Vision Screening - Comments:: Wears rx glasses - up to date with routine eye exams with Cain Waylan, MD.    Goals Addressed             This Visit's Progress    08/13/2023: Get rid of this foot pain.         Depression Screen     08/13/2023   10:25 AM 05/07/2023    8:40 AM 02/27/2023    2:05 PM 11/15/2022    9:49 AM 08/08/2022    8:39 AM 05/02/2022   10:20 AM 01/31/2022    9:43 AM  PHQ 2/9 Scores  PHQ - 2 Score 0 0 0 0 0 0 0  PHQ- 9 Score 0 0  0 0 0     Fall Risk     08/13/2023   10:17 AM 05/07/2023    8:39 AM 02/27/2023    2:05 PM 11/15/2022    9:48 AM 08/08/2022    8:32 AM  Fall Risk   Falls in the past year? 0 0 0 0 0  Number falls in past yr: 0 0  0 0  Injury with Fall? 0 0  0 0  Risk for fall due to : No Fall Risks No Fall Risks  No Fall Risks No Fall Risks;Medication side effect  Follow up Falls evaluation completed Falls evaluation  completed  Falls evaluation completed Falls prevention discussed    MEDICARE RISK AT HOME:  Medicare Risk at Home Any stairs in or around the home?: No If so, are there any without handrails?: No Home free of loose throw rugs in walkways, pet beds, electrical cords, etc?: Yes Adequate lighting in your home to reduce risk of falls?: Yes Life alert?: No Use of a cane, walker or w/c?: No Grab bars in the bathroom?: No Shower chair or bench in shower?: No Elevated toilet seat or a handicapped toilet?: No  TIMED UP AND GO:  Was the test performed?  No  Cognitive Function: Declined/Normal: No cognitive concerns noted by patient or family. Patient alert, oriented, able to answer questions appropriately and recall recent events. No signs of memory loss or confusion.    08/13/2023   10:17 AM  MMSE - Mini Mental State Exam  Not completed: Unable to complete        08/13/2023   10:17 AM 08/08/2022    8:34 AM 06/22/2020    8:40 AM 11/11/2018    8:05 AM 10/28/2016    9:28 AM  6CIT Screen  What Year? 0 points 0 points 0  points 0 points 0 points  What month? 0 points 0 points 0 points 0 points 0 points  What time? 0 points 0 points 0 points 0 points 0 points  Count back from 20 0 points 0 points 0 points 0 points 0 points  Months in reverse 0 points 0 points 4 points 0 points 4 points  Repeat phrase 0 points 6 points 2 points 0 points 2 points  Total Score 0 points 6 points 6 points 0 points 6 points    Immunizations Immunization History  Administered Date(s) Administered   PFIZER(Purple Top)SARS-COV-2 Vaccination 03/03/2019, 03/24/2019, 10/26/2019   Td 07/17/2014    Screening Tests Health Maintenance  Topic Date Due   Pneumococcal Vaccine: 50+ Years (1 of 2 - PCV) Never done   Zoster Vaccines- Shingrix (1 of 2) Never done   COVID-19 Vaccine (4 - 2024-25 season) 09/22/2022   Colonoscopy  05/31/2023   Lung Cancer Screening  09/09/2023   INFLUENZA VACCINE  08/22/2023   HEMOGLOBIN A1C  11/06/2023   Diabetic kidney evaluation - eGFR measurement  05/06/2024   Diabetic kidney evaluation - Urine ACR  05/06/2024   FOOT EXAM  05/18/2024   OPHTHALMOLOGY EXAM  05/26/2024   DTaP/Tdap/Td (2 - Tdap) 07/16/2024   Medicare Annual Wellness (AWV)  08/12/2024   Hepatitis C Screening  Completed   Hepatitis B Vaccines  Aged Out   HPV VACCINES  Aged Out   Meningococcal B Vaccine  Aged Out    Health Maintenance  Health Maintenance Due  Topic Date Due   Pneumococcal Vaccine: 50+ Years (1 of 2 - PCV) Never done   Zoster Vaccines- Shingrix (1 of 2) Never done   COVID-19 Vaccine (4 - 2024-25 season) 09/22/2022   Colonoscopy  05/31/2023   Lung Cancer Screening  09/09/2023   Health Maintenance Items Addressed: Yes Patient is due for the following care gaps: Lung Cancer Screening, Pneumonia vaccine, Shingrix vaccine and Covid vaccines.    Additional Screening:  Vision Screening: Recommended annual ophthalmology exams for early detection of glaucoma and other disorders of the eye. Would you like a referral to  an eye doctor? No    Dental Screening: Recommended annual dental exams for proper oral hygiene  Community Resource Referral / Chronic Care Management: CRR required this visit?  No  CCM required this visit?  No   Plan:    I have personally reviewed and noted the following in the patient's chart:   Medical and social history Use of alcohol, tobacco or illicit drugs  Current medications and supplements including opioid prescriptions. Patient is currently taking opioid prescriptions. Information provided to patient regarding non-opioid alternatives. Patient advised to discuss non-opioid treatment plan with their provider. Functional ability and status Nutritional status Physical activity Advanced directives List of other physicians Hospitalizations, surgeries, and ER visits in previous 12 months Vitals Screenings to include cognitive, depression, and falls Referrals and appointments  In addition, I have reviewed and discussed with patient certain preventive protocols, quality metrics, and best practice recommendations. A written personalized care plan for preventive services as well as general preventive health recommendations were provided to patient.   Roz LOISE Fuller, LPN   [id number]/76/[id number]   After Visit Summary: (MyChart) Due to this being a telephonic visit, the after visit summary with patients personalized plan was offered to patient via MyChart   Notes: Patient is due for the following care gaps: Lung Cancer Screening, Pneumonia vaccine, Shingrix vaccine and Covid vaccines.  [facility] was checked to see if any vaccines given at any outside pharmacies.

## 2023-08-14 ENCOUNTER — Other Ambulatory Visit (HOSPITAL_COMMUNITY): Payer: Self-pay

## 2023-08-14 DIAGNOSIS — E1142 Type 2 diabetes mellitus with diabetic polyneuropathy: Secondary | ICD-10-CM | POA: Diagnosis not present

## 2023-08-14 DIAGNOSIS — M109 Gout, unspecified: Secondary | ICD-10-CM | POA: Diagnosis not present

## 2023-08-14 DIAGNOSIS — M2141 Flat foot [pes planus] (acquired), right foot: Secondary | ICD-10-CM | POA: Diagnosis not present

## 2023-08-14 DIAGNOSIS — M79671 Pain in right foot: Secondary | ICD-10-CM | POA: Diagnosis not present

## 2023-08-14 DIAGNOSIS — M2142 Flat foot [pes planus] (acquired), left foot: Secondary | ICD-10-CM | POA: Diagnosis not present

## 2023-08-14 DIAGNOSIS — I739 Peripheral vascular disease, unspecified: Secondary | ICD-10-CM | POA: Diagnosis not present

## 2023-08-14 DIAGNOSIS — B351 Tinea unguium: Secondary | ICD-10-CM | POA: Diagnosis not present

## 2023-08-14 DIAGNOSIS — M2041 Other hammer toe(s) (acquired), right foot: Secondary | ICD-10-CM | POA: Diagnosis not present

## 2023-08-14 MED ORDER — MELOXICAM 15 MG PO TABS
ORAL_TABLET | ORAL | 0 refills | Status: AC
  Filled 2023-08-14 (×2): qty 21, 21d supply, fill #0

## 2023-08-18 ENCOUNTER — Telehealth: Payer: Self-pay | Admitting: Acute Care

## 2023-08-18 NOTE — Telephone Encounter (Signed)
 Called and spoke with daughter.  She will have the patient call back [phone] to schedule annual LDCT

## 2023-08-22 DIAGNOSIS — J432 Centrilobular emphysema: Secondary | ICD-10-CM | POA: Diagnosis not present

## 2023-08-28 ENCOUNTER — Other Ambulatory Visit (HOSPITAL_COMMUNITY): Payer: Self-pay

## 2023-08-28 DIAGNOSIS — M2041 Other hammer toe(s) (acquired), right foot: Secondary | ICD-10-CM | POA: Diagnosis not present

## 2023-08-28 DIAGNOSIS — M79671 Pain in right foot: Secondary | ICD-10-CM | POA: Diagnosis not present

## 2023-08-28 DIAGNOSIS — E1142 Type 2 diabetes mellitus with diabetic polyneuropathy: Secondary | ICD-10-CM | POA: Diagnosis not present

## 2023-08-28 DIAGNOSIS — M2142 Flat foot [pes planus] (acquired), left foot: Secondary | ICD-10-CM | POA: Diagnosis not present

## 2023-08-28 DIAGNOSIS — B351 Tinea unguium: Secondary | ICD-10-CM | POA: Diagnosis not present

## 2023-08-28 DIAGNOSIS — M2141 Flat foot [pes planus] (acquired), right foot: Secondary | ICD-10-CM | POA: Diagnosis not present

## 2023-09-19 DIAGNOSIS — M2041 Other hammer toe(s) (acquired), right foot: Secondary | ICD-10-CM | POA: Diagnosis not present

## 2023-09-19 DIAGNOSIS — E1142 Type 2 diabetes mellitus with diabetic polyneuropathy: Secondary | ICD-10-CM | POA: Diagnosis not present

## 2023-09-19 DIAGNOSIS — M2141 Flat foot [pes planus] (acquired), right foot: Secondary | ICD-10-CM | POA: Diagnosis not present

## 2023-09-19 DIAGNOSIS — M2142 Flat foot [pes planus] (acquired), left foot: Secondary | ICD-10-CM | POA: Diagnosis not present

## 2023-09-19 DIAGNOSIS — B351 Tinea unguium: Secondary | ICD-10-CM | POA: Diagnosis not present

## 2023-09-19 DIAGNOSIS — M79671 Pain in right foot: Secondary | ICD-10-CM | POA: Diagnosis not present

## 2023-09-22 DIAGNOSIS — J432 Centrilobular emphysema: Secondary | ICD-10-CM | POA: Diagnosis not present

## 2023-09-23 ENCOUNTER — Ambulatory Visit: Admitting: Student in an Organized Health Care Education/Training Program

## 2023-09-23 ENCOUNTER — Encounter: Payer: Self-pay | Admitting: Student in an Organized Health Care Education/Training Program

## 2023-09-23 VITALS — BP 124/60 | HR 69 | Temp 97.1°F | Ht 64.0 in | Wt 127.2 lb

## 2023-09-23 DIAGNOSIS — J9611 Chronic respiratory failure with hypoxia: Secondary | ICD-10-CM

## 2023-09-23 DIAGNOSIS — F17200 Nicotine dependence, unspecified, uncomplicated: Secondary | ICD-10-CM

## 2023-09-23 DIAGNOSIS — F17210 Nicotine dependence, cigarettes, uncomplicated: Secondary | ICD-10-CM | POA: Diagnosis not present

## 2023-09-23 DIAGNOSIS — J432 Centrilobular emphysema: Secondary | ICD-10-CM

## 2023-09-23 LAB — PULMONARY FUNCTION TEST
DL/VA % pred: 38 %
DL/VA: 1.55 ml/min/mmHg/L
DLCO unc % pred: 37 %
DLCO unc: 7.57 ml/min/mmHg
FEF 25-75 Post: 0.84 L/s
FEF 25-75 Pre: 0.8 L/s
FEF2575-%Change-Post: 4 %
FEF2575-%Pred-Post: 52 %
FEF2575-%Pred-Pre: 50 %
FEV1-%Change-Post: 0 %
FEV1-%Pred-Post: 78 %
FEV1-%Pred-Pre: 78 %
FEV1-Post: 1.78 L
FEV1-Pre: 1.76 L
FEV1FVC-%Change-Post: -1 %
FEV1FVC-%Pred-Pre: 79 %
FEV6-%Change-Post: 2 %
FEV6-%Pred-Post: 104 %
FEV6-%Pred-Pre: 102 %
FEV6-Post: 3.08 L
FEV6-Pre: 3.02 L
FEV6FVC-%Change-Post: 0 %
FEV6FVC-%Pred-Post: 106 %
FEV6FVC-%Pred-Pre: 106 %
FVC-%Change-Post: 2 %
FVC-%Pred-Post: 98 %
FVC-%Pred-Pre: 95 %
FVC-Post: 3.13 L
FVC-Pre: 3.06 L
Post FEV1/FVC ratio: 57 %
Post FEV6/FVC ratio: 98 %
Pre FEV1/FVC ratio: 58 %
Pre FEV6/FVC Ratio: 99 %
RV % pred: 109 %
RV: 2.48 L
TLC % pred: 92 %
TLC: 5.4 L

## 2023-09-23 MED ORDER — IPRATROPIUM-ALBUTEROL 0.5-2.5 (3) MG/3ML IN SOLN: 3.0000 mL | Freq: Four times a day (QID) | RESPIRATORY_TRACT | 12 refills | Status: AC | PRN

## 2023-09-23 NOTE — Patient Instructions (Signed)
 Full PFT completed today ? ?

## 2023-09-23 NOTE — Patient Instructions (Signed)
 The [place]  Quitline: Call 1-800-QUIT-NOW (682 840 6469). The [place] Quitline is a free service for [place]  residents. Trained counselors are available from 8 am until 3 am, 365 days per year. Services are available in both [place] and [place].   Web Resources Free online support programs can help you track your progress and share experiences with others who are quitting. These are examples: www.becomeanex.org www.trytostop.org  www.smokefree.gov  www.https://www.vargas.com/.aspx  [facility]: offers comprehensive in-person tobacco treatment counseling at [facility] building ([address], [place] [address]).  Open to everyone. Virtual appointments available. Free parking. Call [phone] to schedule an appointment or [phone] for general information.    Tobacco Cessation Medications  Nicotine  Replacement Therapy (NRT)  Nicotine  is the addictive part of tobacco smoke, but not the most dangerous part. There are 7000 other toxins in cigarettes, including carbon monoxide, that cause disease. People do not generally become addicted to medication. Common problems: People don't use enough medication or stop too early. Medications are safe and effective. Overdose is very uncommon. Use medications as long as needed (3 months minimum). Some combinations work better than single medications. Long acting medications like the NRT patch and bupropion provide continuous treatment for withdrawal symptoms.  PLUS  Short acting medications like the NRT gum, lozenge, inhaler, and nasal spray help people to cope with breakthrough cravings.  ? Nicotine  Patch  Place patch on hairless skin on upper body, including arms and back. Each day: discard old patch, shower, apply new patch to a different site. Apply hydrocortisone cream to mildly red/irritated areas. Call provider if rash develops. If patch causes sleep disturbance, remove patch  at bedtime and replace each morning after shower. Side effects may include: skin irritation, headache, insomnia, abnormal/vivid dreams.  ? Nicotine  Gum  Chew gum slowly, park in cheek when peppery taste or tingling sensation begins (about 15-30 chews). When taste or tingling goes away, begin chewing again. Use until nicotine  is gone (taste or tingle does not return, usually 30 minutes). Park in different areas of mouth. Nicotine  is absorbed through the lining of the mouth. Use enough to control cravings, up to 24 pieces per day (if used alone). Avoid eating or drinking for 15 minutes before using and during use. Side effects may include: mouth/jaw soreness, hiccups, indigestion, hypersalivation.  If gum is not chewed correctly, additional side effects may include lightheadedness, nausea/vomiting, throat and mouth irritation.  ? Nicotine  Lozenge  Allow to dissolve slowly in mouth (20-30 minutes). Do not chew or swallow. Nicotine  release may cause a warm tingling sensation. Occasionally rotate to different areas of the mouth. Use enough to control cravings, up to 20 lozenges per day (if used alone). Avoid eating or drinking for 15 minutes before using and during use. Side effects may include: nausea, hiccups, cough, heartburn, headache, gas, insomnia.  ? Nicotine  Nasal Spray Use 1 spray in each nostril (1 dose) and tilt head back for 1 minute. Do not sniff, swallow, or inhale through nose.  Use at least 8 doses (1 spray in each nostril) , up to 40 doses per day (if used alone). To reduce nasal irritation, spray on cotton swab and insert into nose. Side effects may include: nasal and/or throat irritation (hot, peppery, or burning sensation), nasal irritation, tearing, sneezing, cough, headache.  ? Nicotine  Oral Inhaler (puffer) Inhale into the back of the throat or puff in short breaths. Do not inhale into the lungs.  Puff continuously for 20 minutes (about 80 puffs) until cartridge  is  empty. Change cartridge when it loses the "burning in throat" sensation (feels like air only). Open cartridges can be saved and used again within 24 hours. Use at least 6 and up to 16 cartridges per day (if used alone).  Avoid eating or drinking for 15 minutes before using and during use. Side effects may include: mouth and/or throat irritation, unpleasant taste, cough, nasal irritation, indigestion, hiccups, headache.  ? Chantix  (varenicline ) Days 1-3: Take one 0.5 mg white pill each morning for 3 days, one week before quit date. Days 4-7: Increase to one 0.5 mg white pill twice a day in morning and evening for 4 days.  On Day 8 (target quit date), increase to one 1 mg blue pill twice a day. Maintain this dose for a minimum of 3 months. Take with food and a full glass of water to reduce nausea. Be sure that the two doses are at least 8 hours apart, but try to take second dose early in the evening (i.e. 6 pm) to avoid sleep problems. Common side effects include: nausea, insomnia, headache, abnormal/vivid dreams. Tell your doctor if you have any history of psychiatric illness prior to starting Chantix .  STOP taking CHANTIX  and contact a healthcare provider immediately if you experience agitation, hostility, depressed mood, changes in thoughts or behavior that are not typical for you, thinking about or attempting suicide, allergic or skin reactions including swelling, rash, redness, or peeling of the skin.  For patients who have heart disease: Smoking is a major risk factor for cardiovascular disease, and Chantix  can help you quit smoking. Chantix  may be associated with a small, increased risk of certain heart events in patients who have heart disease. If you have any new or worsening symptoms of heart disease while taking Chantix , such as shortness of breath or trouble breathing, new or worsening chest pain, or new or worsening pain in your legs when walking, call your doctor or get emergency medical  help immediately.  ? Wellbutrin / Zyban (bupropion) Take one 150 mg pill each morning for 3 days, one week before target quit date. On Day 4, increase to one 150 mg pill twice a day, morning and evening.  Maintain this dose for a minimum of 3 months. Be sure that the two doses are at least 8 hours apart, but try to take second dose early in the evening (i.e. 6 pm) to avoid sleep problems. Avoid or minimize use of alcohol when taking this medication. Common side effects include: dry mouth, headache, insomnia, nausea, weight loss.  Risk of seizure is 01/998. STOP taking BUPROPION and contact a healthcare provider immediately if you experience agitation, hostility, depressed mood, changes in thoughts or behavior that are not typical for you, thinking about or attempting suicide, allergic or skin reactions including swelling, rash, redness, or peeling of the skin.

## 2023-09-23 NOTE — Progress Notes (Unsigned)
 Synopsis: Referred in *** by Isadora Hose, MD  Assessment & Plan:   #Centrilobular emphysema #Chronic hypoxic respiratory failure  Presents for follow-up of shortness of breath and chronic hypoxic respiratory failure requiring oxygen  with exertion and nocturnally.  He has had a CT scan of the chest previously that showed emphysema.  Given dyspnea and exertional hypoxia, we initiated him on oxygen  therapy that he is not using as prescribed.  Today, he presents with PFTs that clearly show COPD (Gold 2B with FEV1 of 1.78 L, 78% predicted).  While his lung volumes are within normal, he does have signs of air trapping.  His diffusion capacity is severely depressed at 37% predicted.  We had attempted to prescribe LABA/LAMA therapy for COPD which the patient is unable to afford.  Today, I will attempt to send a prescription for nebulizer machine and nebulized SABA/SAMA with DuoNebs.  Furthermore, I will obtain an echocardiogram to rule out pulmonary hypertension given severe drop in DLCO that appears to be out of proportion to lung volumes and spirometry.  - AMB REFERRAL FOR DME - ipratropium-albuterol  (DUONEB) 0.5-2.5 (3) MG/3ML SOLN; Take 3 mLs by nebulization every 6 (six) hours as needed.  Dispense: 360 mL; Refill: 12 - ECHOCARDIOGRAM COMPLETE; Future  #Tobacco Use Disorder  Patient continues to smoke and he was again counseled regarding the importance of smoking cessation.  This is most pressing given his underlying COPD and chronic hypoxic respiratory failure.  Offered him nicotine  replacement therapy but the patient declined stating that he already has them.   Return in about 4 months (around 01/23/2024).  I spent *** minutes caring for this patient today, including {EM billing:28027}  Hose Isadora, MD [facility] 09/23/2023 11:32 AM    End of visit medications:  Meds ordered this encounter  Medications   ipratropium-albuterol  (DUONEB) 0.5-2.5 (3) MG/3ML SOLN     Sig: Take 3 mLs by nebulization every 6 (six) hours as needed.    Dispense:  360 mL    Refill:  12     Current Outpatient Medications:    amLODipine  (NORVASC ) 10 MG tablet, Take 1 tablet (10 mg total) by mouth daily., Disp: 100 tablet, Rfl: 2   aspirin  EC 81 MG tablet, Take 81 mg by mouth daily as needed for mild pain. , Disp: , Rfl:    atorvastatin  (LIPITOR) 40 MG tablet, Take 1 tablet (40 mg total) by mouth daily., Disp: 90 tablet, Rfl: 3   Cholecalciferol (VITAMIN D3) 50 MCG (2000 UT) TABS, Take by mouth., Disp: , Rfl:    diphenhydrAMINE  (BENADRYL ) 25 MG tablet, Take 25 mg by mouth every 6 (six) hours as needed., Disp: , Rfl:    fexofenadine  (ALLEGRA ) 180 MG tablet, Take 1 tablet (180 mg total) by mouth daily., Disp: 30 tablet, Rfl: 5   fluticasone  (FLONASE ) 50 MCG/ACT nasal spray, Place 2 sprays into both nostrils daily., Disp: 16 g, Rfl: 6   gabapentin  (NEURONTIN ) 300 MG capsule, TAKE 3 CAPSULES BY MOUTH TWICE  DAILY, Disp: 600 capsule, Rfl: 2   glipiZIDE  (GLUCOTROL ) 5 MG tablet, TAKE 1 TABLET BY MOUTH TWICE  DAILY BEFORE A MEAL, Disp: 200 tablet, Rfl: 2   HYDROcodone -acetaminophen  (NORCO/VICODIN) 5-325 MG tablet, Take 1 tablet by mouth every 6 (six) hours as needed for moderate pain (pain score 4-6)., Disp: 20 tablet, Rfl: 0   ipratropium-albuterol  (DUONEB) 0.5-2.5 (3) MG/3ML SOLN, Take 3 mLs by nebulization every 6 (six) hours as needed., Disp: 360 mL, Rfl: 12   meloxicam  (  MOBIC ) 15 MG tablet, Take 1 tablet by mouth once a day for 21 days, Disp: 21 tablet, Rfl: 0   metFORMIN  (GLUCOPHAGE ) 1000 MG tablet, Take 1 tablet (1,000 mg total) by mouth 2 (two) times daily with a meal., Disp: 200 tablet, Rfl: 2   sildenafil  (VIAGRA ) 100 MG tablet, Take 1 tablet (100 mg total) by mouth as needed 1hr prior to sexual activities, Disp: 30 tablet, Rfl: 3   tiZANidine  (ZANAFLEX ) 2 MG tablet, TAKE 1 TABLET BY MOUTH EVERY 8  HOURS AS NEEDED FOR MUSCLE  SPASM(S), Disp: 15 tablet, Rfl: 0   triamcinolone   cream (KENALOG ) 0.1 %, APPLY TOPICALLY TWICE DAILY AS  NEEDED, Disp: 45 g, Rfl: 0   Tiotropium Bromide-Olodaterol (STIOLTO RESPIMAT ) 2.5-2.5 MCG/ACT AERS, Inhale 2 puffs into the lungs daily. (Patient not taking: Reported on 09/23/2023), Disp: 4 g, Rfl: 12   Subjective:   PATIENT ID: Alexander [name] GENDER: male DOB: Nov 19, 1945, MRN: [id number]  Chief Complaint  Patient presents with   Medical Management of Chronic Issues    No SOB, wheezing or cough. PFT results.    HPI   Patient is a pleasant 78 year old male with a past medical history of emphysema and cigarette use presenting to clinic for follow-up.   During our initial visit in February 2025, he reported minimal symptoms.  They at that time reported cough, shortness of breath, and a wheeze of a few weeks in duration prior to presentation.  He had also experienced exertional dyspnea.  He was seen by his primary care physician where he was noted to be rhonchorous with desaturation.  He was prescribed prednisone  and an x-ray.  He was previously seen by my colleague Dr. Marcela in 2020 where PFTs were ordered.  Chantix  and nicotine  lozenges were attempted, but he felt very ill with the Chantix  and discontinued it.  He is enrolled in our lung cancer screening program with the most recent CT being in August 2024.  He has had a history of prostate cancer status post prostatectomy.  Return Visit 06/06/2023:  He is at baseline and does not feel any worsening in his symptoms.  He is not short of breath nor is he experiencing any wheeze or cough.  He has had some issues using his oxygen  concentrator.  Specifically, it has been turning off secondary to being plugged into a surge protector.  He has not been using his oxygen  with exertion but does report using it nocturnally.  He has not had any fevers or chills, no chest pain, no tightness, no recent exacerbations.  He did present to our clinic for a 6-minute walk test where he was noted to be hypoxic  down to 85%.  He was not able to get the inhaler we prescribed him (Anoro Ellipta ) secondary to cost.  He was unable to get his PFTs scheduled.  He continues to smoke cigarettes.   Return Visit 09/23/2023:  Returns to clinic for follow-up visit accompanied by his grand daughter.  He reports continued symptoms of significant exertional dyspnea.  Minimal cough is reported.  No wheezing.  He has had his PFTs and is here to discuss results.  He also reports not using his oxygen  with exertion but does use it at night.  He was unable to afford his inhaler secondary to his deductible.  He has not been on any inhalers.    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  He did have exposure to dust on his job recently while  he was attempting to cut cement.  He feels that this somewhat contributed to his symptoms.  He smokes cigarettes, having started at the age of 71.  He smoked 1 pack a day for most of his life.  He has around 60 pack years of smoking history.  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  No Financial planner.  He is originally from [place]  and has lived here most of his life.    Ancillary information including prior medications, full medical/surgical/family/social histories, and PFTs (when available) are listed below and have been reviewed.   {PULM QUESTIONNAIRES (Optional):33196}  ROS   Objective:   Vitals:   09/23/23 1056  BP: 124/60  Pulse: 69  Temp: (!) 97.1 F (36.2 C)  SpO2: 91%  Weight: 127 lb 3.2 oz (57.7 kg)  Height: 5' 4 (1.626 m)   91% on *** LPM *** RA BMI Readings from Last 3 Encounters:  09/23/23 21.83 kg/m  09/23/23 22.01 kg/m  08/13/23 22.31 kg/m   Wt Readings from Last 3 Encounters:  09/23/23 127 lb 3.2 oz (57.7 kg)  09/23/23 128 lb 3.2 oz (58.2 kg)  08/13/23 130 lb (59 kg)    Physical Exam    Ancillary Information    Past Medical History:  Diagnosis Date   Angio-edema    CAD (coronary artery disease)    Per [facility] New  Patient Packet    Carpal tunnel syndrome    Per [facility] New Patient Packet    Diabetes mellitus without complication (HCC)    Hypertension    Neuromuscular disorder (HCC)    Peripheral arterial disease (HCC) 11/14/2016   non-occlusive, asymptomatic, seen by vascular surgery Dr. Harvey who rec repeated ABIs annually   Prostate cancer [facility])      Family History  Problem Relation Age of Onset   Diabetes Mother    Depression Father    Hypertension Father    Diabetes Father    Thyroid  disease Father    Hypertension Sister    Diabetes Sister    Congestive Heart Failure Sister    Kidney failure Sister    Diabetes Sister    Lung cancer Brother    Drug abuse Brother    Breast cancer Neg Hx    Prostate cancer Neg Hx    Colon cancer Neg Hx      Past Surgical History:  Procedure Laterality Date   CATARACT EXTRACTION Right 08/23/2020   COLON SURGERY     locked bowel fixed   COLONOSCOPY  2019   HERNIA REPAIR     LEFT HEART CATHETERIZATION WITH CORONARY ANGIOGRAM N/A 09/15/2013   Procedure: LEFT HEART CATHETERIZATION WITH CORONARY ANGIOGRAM;  Surgeon: Peter M Swaz[place], MD;  Location: [facility] CATH LAB;  Service: Cardiovascular;  Laterality: N/A;   PROSTATE SURGERY  2005    Social History   Socioeconomic History   Marital status: Widowed    Spouse name: Not on file   Number of children: 2   Years of education: 9   Highest education level: 12th grade  Occupational History   Occupation: retired    Comment: Ecologist  Tobacco Use   Smoking status: Every Day    Current packs/day: 0.50    Average packs/day: 0.5 packs/day for 55.0 years (27.5 ttl pk-yrs)    Types: Cigarettes   Smokeless tobacco: Never   Tobacco comments:    Smokes 0.50 PPD- khj 09/23/2023    Started smoking at 78 years old    Smoked 1 PDD at  his heaviest.  Vaping Use   Vaping status: Never Used  Substance and Sexual Activity   Alcohol use: No   Drug use: No   Sexual activity: Yes  Other Topics Concern   Not on  file  Social History Narrative   Patient is single and his daughter lives with him.   Patient has two children.   Patient has a 9th grade education.   Patient works in [employer] representative (part-time).   Patient drinks one to two cups of coffee daily.   Patient is right-handed.         Per [facility] New Patient Packet:   Diet:Left blank      Caffeine:Yes      Married, if yes what year: Widowed      Do you live in a house, apartment, assisted living, condo, trailer, ect: House, 3 persons      Is it one or more stories: One      Pets: No      Current/Past profession: Recruitment consultant      Highest level or education completed:       Exercise:         No         Type and how often:          Living Will: No   DNR: No   POA/HPOA: No      Functional Status:   Do you have difficulty bathing or dressing yourself? No   Do you have difficulty preparing food or eating? No   Do you have difficulty managing your medications? No   Do you have difficulty managing your finances? No   Do you have difficulty affording your medications? No   Social Drivers of Corporate investment banker Strain: Low Risk  (08/13/2023)   Overall Financial Resource Strain (CARDIA)    Difficulty of Paying Living Expenses: Not hard at all  Food Insecurity: No Food Insecurity (08/13/2023)   Hunger Vital Sign    Worried About Running Out of Food in the Last Year: Never true    Ran Out of Food in the Last Year: Never true  Transportation Needs: No Transportation Needs (08/13/2023)   PRAPARE - Administrator, Civil Service (Medical): No    Lack of Transportation (Non-Medical): No  Physical Activity: Unknown (08/13/2023)   Exercise Vital Sign    Days of Exercise per Week: Patient declined    Minutes of Exercise per Session: 30 min  Stress: No Stress Concern Present (08/13/2023)   [employer] of Occupational Health - Occupational Stress Questionnaire    Feeling of Stress: Not at all  Social Connections: Moderately  Isolated (08/13/2023)   Social Connection and Isolation Panel    Frequency of Communication with Friends and Family: More than three times a week    Frequency of Social Gatherings with Friends and Family: More than three times a week    Attends Religious Services: 1 to 4 times per year    Active Member of [employer] or Organizations: No    Attends Banker Meetings: Never    Marital Status: Widowed  Intimate Partner Violence: Not At Risk (08/13/2023)   Humiliation, Afraid, Rape, and Kick questionnaire    Fear of Current or Ex-Partner: No    Emotionally Abused: No    Physically Abused: No    Sexually Abused: No     Allergies  Allergen Reactions   Lisinopril  Swelling    angioedema     CBC  Component Value Date/Time   WBC 6.4 02/27/2023 1447   RBC 4.36 02/27/2023 1447   HGB 13.4 02/27/2023 1447   HGB 13.5 08/10/2018 1007   HCT 40.7 02/27/2023 1447   HCT 41.2 08/10/2018 1007   PLT 306.0 02/27/2023 1447   PLT 230 08/10/2018 1007   MCV 93.3 02/27/2023 1447   MCV 90 08/10/2018 1007   MCH 30.3 05/10/2020 1043   MCHC 32.8 02/27/2023 1447   RDW 14.0 02/27/2023 1447   RDW 14.4 08/10/2018 1007   LYMPHSABS 1,061 05/10/2020 1043   LYMPHSABS 2.6 08/10/2018 1007   MONOABS 0.5 06/16/2009 1945   EOSABS 191 05/10/2020 1043   EOSABS 0.2 08/10/2018 1007   BASOSABS 58 05/10/2020 1043   BASOSABS 0.1 08/10/2018 1007    Pulmonary Functions Testing Results:    Latest Ref Rng & Units 09/23/2023    9:41 AM  PFT Results  FVC-Pre L 3.06  P  FVC-Predicted Pre % 95  P  FVC-Post L 3.13  P  FVC-Predicted Post % 98  P  Pre FEV1/FVC % % 58  P  Post FEV1/FCV % % 57  P  FEV1-Pre L 1.76  P  FEV1-Predicted Pre % 78  P  FEV1-Post L 1.78  P  DLCO uncorrected ml/min/mmHg 7.57  P  DLCO UNC% % 37  P  DLVA Predicted % 38  P  TLC L 5.40  P  TLC % Predicted % 92  P  RV % Predicted % 109  P    P Preliminary result    Outpatient Medications Prior to Visit  Medication Sig Dispense Refill    amLODipine  (NORVASC ) 10 MG tablet Take 1 tablet (10 mg total) by mouth daily. 100 tablet 2   aspirin  EC 81 MG tablet Take 81 mg by mouth daily as needed for mild pain.      atorvastatin  (LIPITOR) 40 MG tablet Take 1 tablet (40 mg total) by mouth daily. 90 tablet 3   Cholecalciferol (VITAMIN D3) 50 MCG (2000 UT) TABS Take by mouth.     diphenhydrAMINE  (BENADRYL ) 25 MG tablet Take 25 mg by mouth every 6 (six) hours as needed.     fexofenadine  (ALLEGRA ) 180 MG tablet Take 1 tablet (180 mg total) by mouth daily. 30 tablet 5   fluticasone  (FLONASE ) 50 MCG/ACT nasal spray Place 2 sprays into both nostrils daily. 16 g 6   gabapentin  (NEURONTIN ) 300 MG capsule TAKE 3 CAPSULES BY MOUTH TWICE  DAILY 600 capsule 2   glipiZIDE  (GLUCOTROL ) 5 MG tablet TAKE 1 TABLET BY MOUTH TWICE  DAILY BEFORE A MEAL 200 tablet 2   HYDROcodone -acetaminophen  (NORCO/VICODIN) 5-325 MG tablet Take 1 tablet by mouth every 6 (six) hours as needed for moderate pain (pain score 4-6). 20 tablet 0   meloxicam  (MOBIC ) 15 MG tablet Take 1 tablet by mouth once a day for 21 days 21 tablet 0   metFORMIN  (GLUCOPHAGE ) 1000 MG tablet Take 1 tablet (1,000 mg total) by mouth 2 (two) times daily with a meal. 200 tablet 2   sildenafil  (VIAGRA ) 100 MG tablet Take 1 tablet (100 mg total) by mouth as needed 1hr prior to sexual activities 30 tablet 3   tiZANidine  (ZANAFLEX ) 2 MG tablet TAKE 1 TABLET BY MOUTH EVERY 8  HOURS AS NEEDED FOR MUSCLE  SPASM(S) 15 tablet 0   triamcinolone  cream (KENALOG ) 0.1 % APPLY TOPICALLY TWICE DAILY AS  NEEDED 45 g 0   Tiotropium Bromide-Olodaterol (STIOLTO RESPIMAT ) 2.5-2.5 MCG/ACT AERS Inhale 2 puffs into  the lungs daily. (Patient not taking: Reported on 09/23/2023) 4 g 12   No facility-administered medications prior to visit.

## 2023-09-23 NOTE — Progress Notes (Signed)
 Full PFT completed today ? ?

## 2023-09-30 ENCOUNTER — Other Ambulatory Visit: Payer: Self-pay | Admitting: Family Medicine

## 2023-09-30 DIAGNOSIS — E785 Hyperlipidemia, unspecified: Secondary | ICD-10-CM

## 2023-09-30 DIAGNOSIS — J310 Chronic rhinitis: Secondary | ICD-10-CM

## 2023-10-01 DIAGNOSIS — J432 Centrilobular emphysema: Secondary | ICD-10-CM | POA: Diagnosis not present

## 2023-10-14 ENCOUNTER — Ambulatory Visit: Payer: Medicare Other | Admitting: Dermatology

## 2023-10-22 DIAGNOSIS — J432 Centrilobular emphysema: Secondary | ICD-10-CM | POA: Diagnosis not present

## 2023-10-27 DIAGNOSIS — Z860100 Personal history of colon polyps, unspecified: Secondary | ICD-10-CM | POA: Diagnosis not present

## 2023-10-27 DIAGNOSIS — R0602 Shortness of breath: Secondary | ICD-10-CM | POA: Diagnosis not present

## 2023-10-27 DIAGNOSIS — Z1211 Encounter for screening for malignant neoplasm of colon: Secondary | ICD-10-CM | POA: Diagnosis not present

## 2023-10-31 DIAGNOSIS — J432 Centrilobular emphysema: Secondary | ICD-10-CM | POA: Diagnosis not present

## 2023-11-06 DIAGNOSIS — L84 Corns and callosities: Secondary | ICD-10-CM | POA: Diagnosis not present

## 2023-11-06 DIAGNOSIS — M2142 Flat foot [pes planus] (acquired), left foot: Secondary | ICD-10-CM | POA: Diagnosis not present

## 2023-11-06 DIAGNOSIS — E1142 Type 2 diabetes mellitus with diabetic polyneuropathy: Secondary | ICD-10-CM | POA: Diagnosis not present

## 2023-11-06 DIAGNOSIS — B351 Tinea unguium: Secondary | ICD-10-CM | POA: Diagnosis not present

## 2023-11-06 DIAGNOSIS — M2041 Other hammer toe(s) (acquired), right foot: Secondary | ICD-10-CM | POA: Diagnosis not present

## 2023-11-07 ENCOUNTER — Ambulatory Visit
Admission: RE | Admit: 2023-11-07 | Discharge: 2023-11-07 | Disposition: A | Discharge status: Home / Self Care | Source: Ambulatory Visit | Attending: Student in an Organized Health Care Education/Training Program | Admitting: Student in an Organized Health Care Education/Training Program

## 2023-11-07 DIAGNOSIS — I2510 Atherosclerotic heart disease of native coronary artery without angina pectoris: Secondary | ICD-10-CM | POA: Diagnosis not present

## 2023-11-07 DIAGNOSIS — I10 Essential (primary) hypertension: Secondary | ICD-10-CM | POA: Diagnosis not present

## 2023-11-07 DIAGNOSIS — J9611 Chronic respiratory failure with hypoxia: Secondary | ICD-10-CM

## 2023-11-07 DIAGNOSIS — E119 Type 2 diabetes mellitus without complications: Secondary | ICD-10-CM | POA: Insufficient documentation

## 2023-11-07 DIAGNOSIS — I340 Nonrheumatic mitral (valve) insufficiency: Secondary | ICD-10-CM | POA: Insufficient documentation

## 2023-11-07 LAB — ECHOCARDIOGRAM COMPLETE
AR max vel: 2.51 cm2
AV Area VTI: 2.77 cm2
AV Area mean vel: 2.36 cm2
AV Mean grad: 2 mmHg
AV Peak grad: 3.6 mmHg
Ao pk vel: 0.95 m/s
Area-P 1/2: 3.23 cm2
MV VTI: 2.18 cm2
S' Lateral: 3.4 cm

## 2023-11-07 NOTE — Progress Notes (Signed)
*  PRELIMINARY RESULTS* Echocardiogram 2D Echocardiogram has been performed.  Alexander [name] 11/07/2023, 9:49 AM

## 2023-11-10 ENCOUNTER — Encounter: Admitting: Family Medicine

## 2023-11-14 DIAGNOSIS — M48062 Spinal stenosis, lumbar region with neurogenic claudication: Secondary | ICD-10-CM | POA: Diagnosis not present

## 2023-11-14 DIAGNOSIS — E1165 Type 2 diabetes mellitus with hyperglycemia: Secondary | ICD-10-CM | POA: Diagnosis not present

## 2023-11-14 DIAGNOSIS — I10 Essential (primary) hypertension: Secondary | ICD-10-CM | POA: Diagnosis not present

## 2023-11-14 DIAGNOSIS — J439 Emphysema, unspecified: Secondary | ICD-10-CM | POA: Diagnosis not present

## 2023-11-14 DIAGNOSIS — R809 Proteinuria, unspecified: Secondary | ICD-10-CM | POA: Diagnosis not present

## 2023-11-14 DIAGNOSIS — M48061 Spinal stenosis, lumbar region without neurogenic claudication: Secondary | ICD-10-CM | POA: Diagnosis not present

## 2023-11-17 ENCOUNTER — Ambulatory Visit: Payer: Self-pay | Admitting: Student in an Organized Health Care Education/Training Program

## 2023-11-25 ENCOUNTER — Other Ambulatory Visit (HOSPITAL_COMMUNITY): Payer: Self-pay | Admitting: Urology

## 2023-11-25 DIAGNOSIS — C61 Malignant neoplasm of prostate: Secondary | ICD-10-CM

## 2023-12-04 ENCOUNTER — Encounter (HOSPITAL_COMMUNITY)
Admission: RE | Admit: 2023-12-04 | Discharge: 2023-12-04 | Disposition: A | Discharge status: Home / Self Care | Source: Ambulatory Visit | Attending: Urology | Admitting: Urology

## 2023-12-04 DIAGNOSIS — C61 Malignant neoplasm of prostate: Secondary | ICD-10-CM | POA: Diagnosis present

## 2023-12-04 MED ORDER — FLOTUFOLASTAT F 18 GALLIUM 296-5846 MBQ/ML IV SOLN
7.3200 | Freq: Once | INTRAVENOUS | Status: AC
  Administered 2023-12-04: 7.32 via INTRAVENOUS

## 2023-12-25 ENCOUNTER — Ambulatory Visit: Admitting: Family Medicine

## 2023-12-25 ENCOUNTER — Encounter: Payer: Self-pay | Admitting: Family Medicine

## 2023-12-25 ENCOUNTER — Other Ambulatory Visit (HOSPITAL_COMMUNITY): Payer: Self-pay

## 2023-12-25 VITALS — BP 136/78 | HR 74 | Temp 98.2°F | Resp 16 | Ht 64.0 in | Wt 133.3 lb

## 2023-12-25 DIAGNOSIS — E1159 Type 2 diabetes mellitus with other circulatory complications: Secondary | ICD-10-CM

## 2023-12-25 DIAGNOSIS — J432 Centrilobular emphysema: Secondary | ICD-10-CM

## 2023-12-25 LAB — GLUCOSE, POCT (MANUAL RESULT ENTRY): POC Glucose: 95 mg/dL (ref 70–99)

## 2023-12-25 MED ORDER — PREDNISONE 20 MG PO TABS
40.0000 mg | ORAL_TABLET | Freq: Every day | ORAL | 0 refills | Status: AC
  Filled 2023-12-25: qty 6, 3d supply, fill #0

## 2023-12-25 MED ORDER — DOXYCYCLINE HYCLATE 100 MG PO TABS
100.0000 mg | ORAL_TABLET | Freq: Two times a day (BID) | ORAL | 0 refills | Status: AC
  Filled 2023-12-25: qty 14, 7d supply, fill #0

## 2023-12-25 NOTE — Progress Notes (Signed)
 Subjective:  Patient ID: Alexander [name], male    DOB: 23-Apr-1945  Age: 78 y.o. MRN: [id number]  CC:  Chief Complaint  Patient presents with   Cough    Sx started 1.5 weeks ago. No other sx. Dry. No mucus. No hx of asthma.     HPI Alexander [name] presents for   Cough Past 1.5 weeks. Clear mucus at times. New cough. Min wheeze. No fever.  Has been using duoneb once per day after work. Some relief.  No fever.  No sick contacts.  Some nasal congestion at times. Only occasional use of nasal spray. OTC.   History of pulmonary centrilobular emphysema with chronic hypoxic respiratory failure.  Has been seen by pulmonary, and treated with Anora Ellipta.  Oxygen  use at night. Chart reviewed.  Visit with pulmonary,  Dr. Isadora 09/23/23.  PFTs indicated COPD, Gold 2B.  At that time reported LABA/LAMA therapy was cost prohibitive.  Prescription sent for nebulizer machine and nebulized SABA/SAMA with DuoNebs.  Smoking cessation was discussed.  Echo without signs of pulmonary hypertension in October.   Lab Results  Component Value Date   HGBA1C 7.5 (H) 05/07/2023  Diabetes followed by [facility] medical, he reports visit few weeks ago.  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 04/22/2023   Chronic pain syndrome 03/06/2023   History of colonic polyps 03/06/2023   Right carpal tunnel syndrome 06/08/2019   Malignant neoplasm of prostate (HCC) 10/27/2018   Allergic reaction 08/10/2018   Angioedema 08/10/2018   Peripheral arterial occlusive disease 10/28/2016   Hyperlipidemia LDL goal <70 05/01/2015   Tobacco use disorder 12/09/2013   Coronary artery disease involving native coronary artery of native heart without angina pectoris 12/09/2013   Erectile dysfunction due to diseases classified elsewhere 12/09/2013   Type 2 diabetes mellitus with other circulatory  complications (HCC) 12/09/2013   Elevated prostate specific antigen (PSA) 09/17/2013   Spinal stenosis of lumbar region 01/27/2013   Left lumbar radiculopathy 09/30/2012   Chronic rhinitis 04/08/2011   Hypertension    HTN (hypertension), benign 03/09/2011   History of prostate cancer 03/09/2011   Past Medical History:  Diagnosis Date   Angio-edema    CAD (coronary artery disease)    Per [facility] New Patient Packet    Carpal tunnel syndrome    Per [facility] New Patient Packet    Diabetes mellitus without complication (HCC)    Hyperlipidemia    Hypertension    Neuromuscular disorder (HCC)    Oxygen  deficiency    Peripheral arterial disease 11/14/2016   non-occlusive, asymptomatic, seen by vascular surgery Dr. Harvey who rec repeated ABIs annually   Prostate cancer [facility])    Past Surgical History:  Procedure Laterality Date   CATARACT EXTRACTION Right 08/23/2020   COLON SURGERY     locked bowel fixed   COLONOSCOPY  2019   HERNIA REPAIR     LEFT HEART CATHETERIZATION WITH CORONARY ANGIOGRAM N/A 09/15/2013   Procedure: LEFT HEART CATHETERIZATION WITH CORONARY ANGIOGRAM;  Surgeon: Peter M Jordan, MD;  Location: [facility] CATH LAB;  Service: Cardiovascular;  Laterality: N/A;   PROSTATE SURGERY  2005   Allergies  Allergen Reactions   Lisinopril  Swelling    angioedema   Prior to Admission medications   Medication Sig Start Date End Date Taking? Authorizing Provider  amLODipine  (NORVASC ) 10 MG tablet Take 1 tablet (  10 mg total) by mouth daily. 05/07/23  Yes Levora Reyes SAUNDERS, MD  aspirin  EC 81 MG tablet Take 81 mg by mouth daily as needed for mild pain.    Yes [provider]  atorvastatin  (LIPITOR) 40 MG tablet TAKE 1 TABLET BY MOUTH DAILY 09/30/23  Yes Levora Reyes SAUNDERS, MD  diphenhydrAMINE  (BENADRYL ) 25 MG tablet Take 25 mg by mouth every 6 (six) hours as needed.   Yes [provider]  fexofenadine  (ALLEGRA ) 180 MG tablet Take 1 tablet (180 mg total) by mouth daily. 08/10/18  Yes  Bobbitt, Elgin Pepper, MD  fluticasone  (FLONASE ) 50 MCG/ACT nasal spray Place 2 sprays into both nostrils daily. 03/18/19  Yes Melonie Colonel, Mikel HERO, MD  gabapentin  (NEURONTIN ) 300 MG capsule TAKE 3 CAPSULES BY MOUTH TWICE  DAILY 04/28/23  Yes Levora Reyes SAUNDERS, MD  glipiZIDE  (GLUCOTROL ) 5 MG tablet TAKE 1 TABLET BY MOUTH TWICE  DAILY BEFORE A MEAL 06/04/23  Yes Levora Reyes SAUNDERS, MD  HYDROcodone -acetaminophen  (NORCO/VICODIN) 5-325 MG tablet Take 1 tablet by mouth every 6 (six) hours as needed for moderate pain (pain score 4-6). 04/28/23  Yes Levora Reyes SAUNDERS, MD  ipratropium-albuterol  (DUONEB) 0.5-2.5 (3) MG/3ML SOLN Take 3 mLs by nebulization every 6 (six) hours as needed. 09/23/23  Yes Dgayli, Belva, MD  meloxicam  (MOBIC ) 15 MG tablet Take 1 tablet by mouth once a day for 21 days 08/14/23  Yes   metFORMIN  (GLUCOPHAGE ) 1000 MG tablet Take 1 tablet (1,000 mg total) by mouth 2 (two) times daily with a meal. 11/15/22  Yes Levora Reyes SAUNDERS, MD  montelukast  (SINGULAIR ) 10 MG tablet TAKE 1 TABLET BY MOUTH DAILY 09/30/23  Yes Levora Reyes SAUNDERS, MD  sildenafil  (VIAGRA ) 100 MG tablet Take 1 tablet (100 mg total) by mouth as needed 1hr prior to sexual activities 12/21/21  Yes   triamcinolone  cream (KENALOG ) 0.1 % APPLY TOPICALLY TWICE DAILY AS  NEEDED 06/04/23  Yes Levora Reyes SAUNDERS, MD  Cholecalciferol (VITAMIN D3) 50 MCG (2000 UT) TABS Take by mouth. Patient not taking: Reported on 12/25/2023    [provider]  Tiotropium Bromide-Olodaterol (STIOLTO RESPIMAT ) 2.5-2.5 MCG/ACT AERS Inhale 2 puffs into the lungs daily. Patient not taking: Reported on 12/25/2023 06/06/23   Isadora Belva, MD  tiZANidine  (ZANAFLEX ) 2 MG tablet TAKE 1 TABLET BY MOUTH EVERY 8  HOURS AS NEEDED FOR MUSCLE  SPASM(S) 06/04/23   Levora Reyes SAUNDERS, MD   Social History   Socioeconomic History   Marital status: Widowed    Spouse name: Not on file   Number of children: 2   Years of education: 9   Highest education level: 9th grade   Occupational History   Occupation: retired    Comment: ecologist  Tobacco Use   Smoking status: Every Day    Current packs/day: 0.50    Average packs/day: 0.5 packs/day for 55.0 years (27.5 ttl pk-yrs)    Types: Cigarettes   Smokeless tobacco: Never   Tobacco comments:    Smokes 0.50 PPD- khj 09/23/2023    Started smoking at 78 years old    Smoked 1 PDD at his heaviest.  Vaping Use   Vaping status: Never Used  Substance and Sexual Activity   Alcohol use: No   Drug use: No   Sexual activity: Yes  Other Topics Concern   Not on file  Social History Narrative   Patient is single and his daughter lives with him.   Patient has two children.   Patient has  a 9th grade education.   Patient works in holiday representative (part-time).   Patient drinks one to two cups of coffee daily.   Patient is right-handed.         Per [facility] New Patient Packet:   Diet:Left blank      Caffeine:Yes      Married, if yes what year: Widowed      Do you live in a house, apartment, assisted living, condo, trailer, ect: House, 3 persons      Is it one or more stories: One      Pets: No      Current/Past profession: Recruitment Consultant      Highest level or education completed:       Exercise:         No         Type and how often:          Living Will: No   DNR: No   POA/HPOA: No      Functional Status:   Do you have difficulty bathing or dressing yourself? No   Do you have difficulty preparing food or eating? No   Do you have difficulty managing your medications? No   Do you have difficulty managing your finances? No   Do you have difficulty affording your medications? No   Social Drivers of Corporate Investment Banker Strain: Low Risk  (12/24/2023)   Overall Financial Resource Strain (CARDIA)    Difficulty of Paying Living Expenses: Not hard at all  Food Insecurity: No Food Insecurity (12/24/2023)   Hunger Vital Sign    Worried About Running Out of Food in the Last Year: Never true    Ran Out of Food  in the Last Year: Never true  Transportation Needs: No Transportation Needs (12/24/2023)   PRAPARE - Administrator, Civil Service (Medical): No    Lack of Transportation (Non-Medical): No  Physical Activity: Insufficiently Active (12/24/2023)   Exercise Vital Sign    Days of Exercise per Week: 5 days    Minutes of Exercise per Session: 20 min  Stress: No Stress Concern Present (12/24/2023)   [employer] of Occupational Health - Occupational Stress Questionnaire    Feeling of Stress: Not at all  Social Connections: Socially Isolated (12/24/2023)   Social Connection and Isolation Panel    Frequency of Communication with Friends and Family: More than three times a week    Frequency of Social Gatherings with Friends and Family: More than three times a week    Attends Religious Services: Never    Database Administrator or Organizations: No    Attends Banker Meetings: Not on file    Marital Status: Widowed  Intimate Partner Violence: Not At Risk (08/13/2023)   Humiliation, Afraid, Rape, and Kick questionnaire    Fear of Current or Ex-Partner: No    Emotionally Abused: No    Physically Abused: No    Sexually Abused: No    Review of Systems   Objective:   Vitals:   12/25/23 0830  BP: 136/78  Pulse: 74  Resp: 16  Temp: 98.2 F (36.8 C)  TempSrc: Temporal  SpO2: 92%  Weight: 133 lb 4.8 oz (60.5 kg)  Height: 5' 4 (1.626 m)     Physical Exam Vitals reviewed.  Constitutional:      Appearance: He is well-developed.  HENT:     Head: Normocephalic and atraumatic.  Neck:     Vascular: No carotid bruit or  JVD.  Cardiovascular:     Rate and Rhythm: Normal rate and regular rhythm.     Heart sounds: Normal heart sounds. No murmur heard. Pulmonary:     Effort: Pulmonary effort is normal.     Breath sounds: Wheezing (faint expiratory.) present.  Musculoskeletal:     Right lower leg: No edema.     Left lower leg: No edema.  Skin:    General: Skin  is warm and dry.  Neurological:     Mental Status: He is alert and oriented to person, place, and time.  Psychiatric:        Mood and Affect: Mood normal.    Results for orders placed or performed in visit on 12/25/23  POCT glucose (manual entry)   Collection Time: 12/25/23  9:07 AM  Result Value Ref Range   POC Glucose 95 70 - 99 mg/dl    Assessment & Plan:  Alexander [name] is a 78 y.o. male . Centrilobular emphysema (HCC) - Plan: predniSONE  (DELTASONE ) 20 MG tablet, doxycycline (VIBRA-TABS) 100 MG tablet  - Suspected flare of COPD plus or minus component of postnasal drip contributing to cough.  As he does not typically have a cough or productive cough, will cover with short course of prednisone  40 mg daily x 3 days as mild flare.  Doxycycline 100 mg twice daily for 1 week with RTC precautions.  Potential side effects of meds discussed and advised to monitor home blood sugars given underlying diabetes.  Type 2 diabetes mellitus with other circulatory complications (HCC) - Plan: POCT glucose (manual entry)  - Followed by recent medical, unable to see most recent labs.  Home monitoring on prednisone  with precautions if hyperglycemic on prednisone .  Recheck 1 month for chronic condition follow-up.  Meds ordered this encounter  Medications   predniSONE  (DELTASONE ) 20 MG tablet    Sig: Take 2 tablets (40 mg total) by mouth daily with breakfast.    Dispense:  6 tablet    Refill:  0   doxycycline (VIBRA-TABS) 100 MG tablet    Sig: Take 1 tablet (100 mg total) by mouth 2 (two) times daily.    Dispense:  14 tablet    Refill:  0   Patient Instructions  I suspect you have a flare of your COPD or emphysema.  Continue the DuoNebs up to 4 times per day as needed for wheezing or cough.  Prednisone  40 mg/day for the next few days to help with cough/wheeze and antibiotic was prescribed for the productive cough.  Flonase  nasal spray for any nasal congestion which can also contribute to cough.   If not improving in the next few days please return for recheck, sooner if worse.  With diabetes, we will need to keep an eye on your blood sugars at home since prednisone  can increase those blood sugars.  If you notice readings over 250, call our office or your diabetes specialist to discuss treatment.  Follow-up with me in the next 1 month to review chronic conditions.  Take care!    Signed,   Reyes Pines, MD [facility], [facility] 12/25/23 9:05 AM

## 2023-12-25 NOTE — Patient Instructions (Signed)
 I suspect you have a flare of your COPD or emphysema.  Continue the DuoNebs up to 4 times per day as needed for wheezing or cough.  Prednisone  40 mg/day for the next few days to help with cough/wheeze and antibiotic was prescribed for the productive cough.  Flonase  nasal spray for any nasal congestion which can also contribute to cough.  If not improving in the next few days please return for recheck, sooner if worse.  With diabetes, we will need to keep an eye on your blood sugars at home since prednisone  can increase those blood sugars.  If you notice readings over 250, call our office or your diabetes specialist to discuss treatment.  Follow-up with me in the next 1 month to review chronic conditions.  Take care!

## 2023-12-31 ENCOUNTER — Telehealth: Payer: Self-pay

## 2023-12-31 DIAGNOSIS — R052 Subacute cough: Secondary | ICD-10-CM

## 2023-12-31 DIAGNOSIS — J432 Centrilobular emphysema: Secondary | ICD-10-CM

## 2023-12-31 MED ORDER — BENZONATATE 100 MG PO CAPS: 100.0000 mg | ORAL_CAPSULE | Freq: Three times a day (TID) | ORAL | 0 refills | Status: DC | PRN

## 2023-12-31 NOTE — Telephone Encounter (Signed)
 Copied from CRM #[id number]. Topic: Clinical - Medication Question >> Dec 31, 2023 11:50 AM Aleatha C wrote: Reason for CRM: Patient  daughter called because patient was prescribe antibiotic for cough and now he would like to be prescribe cough syrup please call daughter Lorenza [phone] If it can be done and sent to  [facility] [address] [place], [place], [place] [address] Open  Closes 7 PM  More hours [phone]

## 2023-12-31 NOTE — Telephone Encounter (Signed)
 Called Tasha.DPR verified.  Let her know that I was hesitant to use narcotic cough syrup with his pulmonary history, with chronic hypoxic respiratory failure.  Would initially make sure he is using the DuoNebs as recommended, and I will send Tessalon  Perles as needed for more of an irritant type cough with RTC precautions given.  Understanding expressed, all questions answered.

## 2023-12-31 NOTE — Telephone Encounter (Signed)
 Patient was seen 12/25/23. Patient is requesting a cough medication. Okay to send?

## 2024-01-05 ENCOUNTER — Other Ambulatory Visit: Payer: Self-pay | Admitting: Family Medicine

## 2024-01-05 DIAGNOSIS — M5416 Radiculopathy, lumbar region: Secondary | ICD-10-CM

## 2024-01-07 ENCOUNTER — Ambulatory Visit: Admitting: Student in an Organized Health Care Education/Training Program

## 2024-01-07 ENCOUNTER — Encounter: Payer: Self-pay | Admitting: Student in an Organized Health Care Education/Training Program

## 2024-01-07 VITALS — BP 120/72 | HR 78 | Temp 97.7°F | Ht 64.0 in | Wt 130.0 lb

## 2024-01-07 DIAGNOSIS — J449 Chronic obstructive pulmonary disease, unspecified: Secondary | ICD-10-CM

## 2024-01-07 DIAGNOSIS — J9611 Chronic respiratory failure with hypoxia: Secondary | ICD-10-CM

## 2024-01-07 DIAGNOSIS — F17200 Nicotine dependence, unspecified, uncomplicated: Secondary | ICD-10-CM

## 2024-01-07 DIAGNOSIS — F17210 Nicotine dependence, cigarettes, uncomplicated: Secondary | ICD-10-CM

## 2024-01-07 DIAGNOSIS — J432 Centrilobular emphysema: Secondary | ICD-10-CM

## 2024-01-07 MED ORDER — AMOXICILLIN-POT CLAVULANATE 875-125 MG PO TABS: 1.0000 | ORAL_TABLET | Freq: Two times a day (BID) | ORAL | 0 refills | Status: AC

## 2024-01-07 NOTE — Progress Notes (Unsigned)
 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 We attempted long acting inhalers but he was unable to afford, so we resorted to nebulizer therapy with SABA/SAMA (duo-nebs). Patient not compliant with nebulizers due to lack of perceived benefit. He is also inconsistent with his oxygen  use. He also continues to smoke which is contributing to his COPD progression.  We also did discuss the association between gabapentinoids and poor outcomes in COPD, and I recommended that he discuss with his providers tapering down his gabapentin  dose. We also discussed the racial bias present in pulse oximeters, especially over the counter ones, and I encouraged him to consider the possibility of his oxygen  saturation being lower than what it actually reads, and encouraged him to use his oxygen  via nasal cannula. I further counseled him on the importance of using oxygen  with exertion.  Finally, given report of increased cough, I will treat empirically for a mild COPD exacerbation with a course of Augmentin .   - Prescribed Augmentin  for respiratory infection. - Encouraged consistent nebulizer and oxygen  use. - Advised discussing gabapentin  dosage reduction with prescribing physician. - Educated on potential inaccuracies in oxygen  saturation readings due to racial bias in pulse oximeter algorithms. - Encouraged smoking cessation. - amoxicillin -clavulanate (AUGMENTIN ) 875-125 MG tablet; Take 1 tablet by mouth 2 (two) times daily for 7 days.  Dispense: 14 tablet; Refill: 0  #Tobacco use disorder  Continues smoking half a pack per day, exacerbating COPD. Recommended complete cessation.  - Advised smoking cessation.   Return in about 1 year (around 01/06/2025).  Belva November, MD [facility]  I spent 31 minutes caring for  this patient today, including preparing to see the patient, obtaining a medical history , reviewing a separately obtained history, performing a medically appropriate examination and/or evaluation, counseling and educating the patient/family/caregiver, ordering medications, tests, or procedures, documenting clinical information in the electronic health record, and independently interpreting results (not separately reported/billed) and communicating results to the patient/family/caregiver  End of visit medications:  Meds ordered this encounter  Medications   amoxicillin -clavulanate (AUGMENTIN ) 875-125 MG tablet    Sig: Take 1 tablet by mouth 2 (two) times daily for 7 days.    Dispense:  14 tablet    Refill:  0    Current Medications[1]   Subjective:   PATIENT ID: Alexander [name] DOB: [birth year], MRN: [id number]  Chief Complaint  Patient presents with   Medical Management of Chronic Issues    Cough 2-3 weeks. Occasional wheezing. Shortness of breath on exertion.     HPI  Discussed the use of AI scribe software for clinical note transcription with the patient, who gave verbal consent to proceed.  History of Present Illness Alexander [name] is a 78 year old [name] with COPD and chronic hypoxic respiratory failure who presents for follow-up. He is accompanied by his daughter.  During our initial visit in February 2025, he reported minimal symptoms.  They at that time reported cough, shortness of breath, and a wheeze of a few weeks in duration prior to presentation.  He had also experienced exertional dyspnea.  He was seen by his primary care physician where he was noted to be rhonchorous with desaturation.  He was prescribed prednisone  and an x-ray.   He was previously seen by my colleague Dr. Marcela  in 2020 where PFTs were ordered.  Chantix  and nicotine  lozenges were attempted, but he felt very ill with the Chantix  and discontinued it.  He is enrolled in our lung cancer  screening program with the most recent CT being in August 2024.  He has had a history of prostate cancer status post prostatectomy.   Return Visit 06/06/2023:   He is at baseline and does not feel any worsening in his symptoms.  He is not short of breath nor is he experiencing any wheeze or cough.  He has had some issues using his oxygen  concentrator.  Specifically, it has been turning off secondary to being plugged into a surge protector.  He has not been using his oxygen  with exertion but does report using it nocturnally.  He has not had any fevers or chills, no chest pain, no tightness, no recent exacerbations.  He did present to our clinic for a 6-minute walk test where he was noted to be hypoxic down to 85%.  He was not able to get the inhaler we prescribed him (Anoro Ellipta ) secondary to cost.  He was unable to get his PFTs scheduled.  He continues to smoke cigarettes.   Return Visit 09/23/2023:   Returns to clinic for follow-up visit accompanied by his grand daughter.  He reports continued symptoms of significant exertional dyspnea.  Minimal cough is reported.  No wheezing.  He has had his PFTs and is here to discuss results.  He also reports not using his oxygen  with exertion but does use it at night.  He was unable to afford his inhaler secondary to his deductible.  He has not been on any inhalers.  Return Visit 01/07/2024:  He has a history of COPD and chronic hypoxic respiratory failure, and a long history of smoking. He presents with increased symptoms, notably an increased cough. He has been inconsistent with his nebulizer and oxygen  therapy, citing a lack of noticeable improvement. His daughter observes that he seems to have more energy when he uses the nebulizer consistently.  He is currently taking gabapentin  900 mg in the morning and 900 mg in the evening, which causes drowsiness. He sometimes reduces the dose to two capsules instead of three due to this side effect. If he stops taking  gabapentin , he experiences nerve pain in his legs.  He was prescribed prednisone  and an antibiotic by his primary care doctor, which improved his symptoms. He was also prescribed Tessalon  Pearls.  He continues to smoke about half a pack of cigarettes a day. He has not received a flu shot this year and typically does not get one. He has an 31 year old grandson who visits occasionally, but there are not many children around him regularly.   Patient works as a scientist, water quality and in holiday representative.  He did have exposure to dust on his job recently while he was attempting to cut cement.  He feels that this somewhat contributed to his symptoms.  He smokes cigarettes, having started at the age of 53.  He smoked 1 pack a day for most of his life.  He has around 60 pack years of smoking history.  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  No financial planner.  He is originally from [place]  and has lived here most of his life.   Ancillary information including prior medications, full medical/surgical/family/social histories, and PFTs (when available) are listed below and have been reviewed.    Review of Systems  Constitutional:  Negative for chills, fever and  weight loss.  Respiratory:  Positive for cough, sputum production and shortness of breath. Negative for hemoptysis and wheezing.   Cardiovascular:  Negative for chest pain.     Objective:   Vitals:   01/07/24 1507  BP: 120/72  Pulse: 78  Temp: 97.7 F (36.5 C)  TempSrc: Temporal  SpO2: 95%  Weight: 130 lb (59 kg)  Height: 5' 4 (1.626 m)   95% on RA BMI Readings from Last 3 Encounters:  01/07/24 22.31 kg/m  12/25/23 22.88 kg/m  09/23/23 21.83 kg/m   Wt Readings from Last 3 Encounters:  01/07/24 130 lb (59 kg)  12/25/23 133 lb 4.8 oz (60.5 kg)  09/23/23 127 lb 3.2 oz (57.7 kg)    .vitalsmbmi  Physical Exam Constitutional:      Appearance: Normal appearance.  Cardiovascular:     Rate and Rhythm: Normal  rate and regular rhythm.     Pulses: Normal pulses.     Heart sounds: Normal heart sounds.  Pulmonary:     Effort: Pulmonary effort is normal. No respiratory distress.     Breath sounds: No wheezing.  Neurological:     General: No focal deficit present.     Mental Status: He is alert and oriented to person, place, and time. Mental status is at baseline.       Ancillary Information    Past Medical History:  Diagnosis Date   Angio-edema    CAD (coronary artery disease)    Per [facility] New Patient Packet    Carpal tunnel syndrome    Per [facility] New Patient Packet    Diabetes mellitus without complication (HCC)    Hyperlipidemia    Hypertension    Neuromuscular disorder (HCC)    Oxygen  deficiency    Peripheral arterial disease 11/14/2016   non-occlusive, asymptomatic, seen by vascular surgery Dr. Harvey who rec repeated ABIs annually   Prostate cancer [facility])      Family History  Problem Relation Age of Onset   Diabetes Mother    Depression Father    Hypertension Father    Diabetes Father    Thyroid  disease Father    Alcohol abuse Father    Hypertension Sister    Diabetes Sister    Congestive Heart Failure Sister    Kidney failure Sister    Diabetes Sister    Lung cancer Brother    Drug abuse Brother    Breast cancer Neg Hx    Prostate cancer Neg Hx    Colon cancer Neg Hx      Past Surgical History:  Procedure Laterality Date   CATARACT EXTRACTION Right 08/23/2020   COLON SURGERY     locked bowel fixed   COLONOSCOPY  2019   HERNIA REPAIR     LEFT HEART CATHETERIZATION WITH CORONARY ANGIOGRAM N/A 09/15/2013   Procedure: LEFT HEART CATHETERIZATION WITH CORONARY ANGIOGRAM;  Surgeon: Peter M Jordan, MD;  Location: [facility] CATH LAB;  Service: Cardiovascular;  Laterality: N/A;   PROSTATE SURGERY  2005    Social History   Socioeconomic History   Marital status: Widowed    Spouse name: Not on file   Number of children: 2   Years of education: 9   Highest education  level: 9th grade  Occupational History   Occupation: retired    Comment: ecologist  Tobacco Use   Smoking status: Every Day    Current packs/day: 0.50    Average packs/day: 0.9 packs/day for 64.0 years (59.5 ttl pk-yrs)    Types:  Cigarettes    Start date: 1962   Smokeless tobacco: Never   Tobacco comments:    Smokes 0.50 PPD- khj 09/23/2023    Started smoking at 78 years old    Smoked 1 PDD at his heaviest.  Vaping Use   Vaping status: Never Used  Substance and Sexual Activity   Alcohol use: No   Drug use: No   Sexual activity: Yes  Other Topics Concern   Not on file  Social History Narrative   Patient is single and his daughter lives with him.   Patient has two children.   Patient has a 9th grade education.   Patient works in holiday representative (part-time).   Patient drinks one to two cups of coffee daily.   Patient is right-handed.         Per [facility] New Patient Packet:   Diet:Left blank      Caffeine:Yes      Married, if yes what year: Widowed      Do you live in a house, apartment, assisted living, condo, trailer, ect: House, 3 persons      Is it one or more stories: One      Pets: No      Current/Past profession: Recruitment Consultant      Highest level or education completed:       Exercise:         No         Type and how often:          Living Will: No   DNR: No   POA/HPOA: No      Functional Status:   Do you have difficulty bathing or dressing yourself? No   Do you have difficulty preparing food or eating? No   Do you have difficulty managing your medications? No   Do you have difficulty managing your finances? No   Do you have difficulty affording your medications? No   Social Drivers of Health   Tobacco Use: High Risk (01/07/2024)   Patient History    Smoking Tobacco Use: Every Day    Smokeless Tobacco Use: Never    Passive Exposure: Not on file  Financial Resource Strain: Low Risk (12/24/2023)   Overall Financial Resource Strain (CARDIA)    Difficulty of  Paying Living Expenses: Not hard at all  Food Insecurity: No Food Insecurity (12/24/2023)   Epic    Worried About Programme Researcher, Broadcasting/film/video in the Last Year: Never true    Ran Out of Food in the Last Year: Never true  Transportation Needs: No Transportation Needs (12/24/2023)   Epic    Lack of Transportation (Medical): No    Lack of Transportation (Non-Medical): No  Physical Activity: Insufficiently Active (12/24/2023)   Exercise Vital Sign    Days of Exercise per Week: 5 days    Minutes of Exercise per Session: 20 min  Stress: No Stress Concern Present (12/24/2023)   [employer] of Occupational Health - Occupational Stress Questionnaire    Feeling of Stress: Not at all  Social Connections: Socially Isolated (12/24/2023)   Social Connection and Isolation Panel    Frequency of Communication with Friends and Family: More than three times a week    Frequency of Social Gatherings with Friends and Family: More than three times a week    Attends Religious Services: Never    Database Administrator or Organizations: No    Attends Banker Meetings: Not on file    Marital Status: Widowed  Intimate Partner Violence: Not At Risk (08/13/2023)   Epic    Fear of Current or Ex-Partner: No    Emotionally Abused: No    Physically Abused: No    Sexually Abused: No  Depression (PHQ2-9): Low Risk (08/13/2023)   Depression (PHQ2-9)    PHQ-2 Score: 0  Alcohol Screen: Low Risk (08/13/2023)   Alcohol Screen    Last Alcohol Screening Score (AUDIT): 0  Housing: Unknown (12/24/2023)   Epic    Unable to Pay for Housing in the Last Year: No    Number of Times Moved in the Last Year: Not on file    Homeless in the Last Year: No  Utilities: Not At Risk (08/13/2023)   Epic    Threatened with loss of utilities: No  Health Literacy: Adequate Health Literacy (08/08/2022)   B1300 Health Literacy    Frequency of need for help with medical instructions: Never     Allergies[2]   CBC    Component Value  Date/Time   WBC 6.4 02/27/2023 1447   RBC 4.36 02/27/2023 1447   HGB 13.4 02/27/2023 1447   HGB 13.5 08/10/2018 1007   HCT 40.7 02/27/2023 1447   HCT 41.2 08/10/2018 1007   PLT 306.0 02/27/2023 1447   PLT 230 08/10/2018 1007   MCV 93.3 02/27/2023 1447   MCV 90 08/10/2018 1007   MCH 30.3 05/10/2020 1043   MCHC 32.8 02/27/2023 1447   RDW 14.0 02/27/2023 1447   RDW 14.4 08/10/2018 1007   LYMPHSABS 1,061 05/10/2020 1043   LYMPHSABS 2.6 08/10/2018 1007   MONOABS 0.5 06/16/2009 1945   EOSABS 191 05/10/2020 1043   EOSABS 0.2 08/10/2018 1007   BASOSABS 58 05/10/2020 1043   BASOSABS 0.1 08/10/2018 1007    Pulmonary Functions Testing Results:    Latest Ref Rng & Units 09/23/2023    9:41 AM  PFT Results  FVC-Pre L 3.06   FVC-Predicted Pre % 95   FVC-Post L 3.13   FVC-Predicted Post % 98   Pre FEV1/FVC % % 58   Post FEV1/FCV % % 57   FEV1-Pre L 1.76   FEV1-Predicted Pre % 78   FEV1-Post L 1.78   DLCO uncorrected ml/min/mmHg 7.57   DLCO UNC% % 37   DLVA Predicted % 38   TLC L 5.40   TLC % Predicted % 92   RV % Predicted % 109     Outpatient Medications Prior to Visit  Medication Sig Dispense Refill   amLODipine  (NORVASC ) 10 MG tablet Take 1 tablet (10 mg total) by mouth daily. 100 tablet 2   aspirin  EC 81 MG tablet Take 81 mg by mouth daily as needed for mild pain.      atorvastatin  (LIPITOR) 40 MG tablet TAKE 1 TABLET BY MOUTH DAILY 100 tablet 2   benzonatate  (TESSALON ) 100 MG capsule Take 1 capsule (100 mg total) by mouth 3 (three) times daily as needed for cough. 20 capsule 0   Cholecalciferol (VITAMIN D3) 50 MCG (2000 UT) TABS Take by mouth.     diphenhydrAMINE  (BENADRYL ) 25 MG tablet Take 25 mg by mouth every 6 (six) hours as needed.     fexofenadine  (ALLEGRA ) 180 MG tablet Take 1 tablet (180 mg total) by mouth daily. 30 tablet 5   fluticasone  (FLONASE ) 50 MCG/ACT nasal spray Place 2 sprays into both nostrils daily. 16 g 6   gabapentin  (NEURONTIN ) 300 MG capsule TAKE 3  CAPSULES BY MOUTH TWICE  DAILY 600 capsule 2   glipiZIDE  (  GLUCOTROL ) 5 MG tablet TAKE 1 TABLET BY MOUTH TWICE  DAILY BEFORE A MEAL 200 tablet 2   HYDROcodone -acetaminophen  (NORCO/VICODIN) 5-325 MG tablet Take 1 tablet by mouth every 6 (six) hours as needed for moderate pain (pain score 4-6). 20 tablet 0   ipratropium-albuterol  (DUONEB) 0.5-2.5 (3) MG/3ML SOLN Take 3 mLs by nebulization every 6 (six) hours as needed. 360 mL 12   meloxicam  (MOBIC ) 15 MG tablet Take 1 tablet by mouth once a day for 21 days 21 tablet 0   metFORMIN  (GLUCOPHAGE ) 1000 MG tablet Take 1 tablet (1,000 mg total) by mouth 2 (two) times daily with a meal. 200 tablet 2   montelukast  (SINGULAIR ) 10 MG tablet TAKE 1 TABLET BY MOUTH DAILY 100 tablet 2   sildenafil  (VIAGRA ) 100 MG tablet Take 1 tablet (100 mg total) by mouth as needed 1hr prior to sexual activities 30 tablet 3   tiZANidine  (ZANAFLEX ) 2 MG tablet TAKE 1 TABLET BY MOUTH EVERY 8  HOURS AS NEEDED FOR MUSCLE  SPASM(S) 15 tablet 0   triamcinolone  cream (KENALOG ) 0.1 % APPLY TOPICALLY TWICE DAILY AS  NEEDED 45 g 0   No facility-administered medications prior to visit.      [1]  Current Outpatient Medications:    amLODipine  (NORVASC ) 10 MG tablet, Take 1 tablet (10 mg total) by mouth daily., Disp: 100 tablet, Rfl: 2   amoxicillin -clavulanate (AUGMENTIN ) 875-125 MG tablet, Take 1 tablet by mouth 2 (two) times daily for 7 days., Disp: 14 tablet, Rfl: 0   aspirin  EC 81 MG tablet, Take 81 mg by mouth daily as needed for mild pain. , Disp: , Rfl:    atorvastatin  (LIPITOR) 40 MG tablet, TAKE 1 TABLET BY MOUTH DAILY, Disp: 100 tablet, Rfl: 2   benzonatate  (TESSALON ) 100 MG capsule, Take 1 capsule (100 mg total) by mouth 3 (three) times daily as needed for cough., Disp: 20 capsule, Rfl: 0   Cholecalciferol (VITAMIN D3) 50 MCG (2000 UT) TABS, Take by mouth., Disp: , Rfl:    diphenhydrAMINE  (BENADRYL ) 25 MG tablet, Take 25 mg by mouth every 6 (six) hours as needed., Disp: , Rfl:     fexofenadine  (ALLEGRA ) 180 MG tablet, Take 1 tablet (180 mg total) by mouth daily., Disp: 30 tablet, Rfl: 5   fluticasone  (FLONASE ) 50 MCG/ACT nasal spray, Place 2 sprays into both nostrils daily., Disp: 16 g, Rfl: 6   gabapentin  (NEURONTIN ) 300 MG capsule, TAKE 3 CAPSULES BY MOUTH TWICE  DAILY, Disp: 600 capsule, Rfl: 2   glipiZIDE  (GLUCOTROL ) 5 MG tablet, TAKE 1 TABLET BY MOUTH TWICE  DAILY BEFORE A MEAL, Disp: 200 tablet, Rfl: 2   HYDROcodone -acetaminophen  (NORCO/VICODIN) 5-325 MG tablet, Take 1 tablet by mouth every 6 (six) hours as needed for moderate pain (pain score 4-6)., Disp: 20 tablet, Rfl: 0   ipratropium-albuterol  (DUONEB) 0.5-2.5 (3) MG/3ML SOLN, Take 3 mLs by nebulization every 6 (six) hours as needed., Disp: 360 mL, Rfl: 12   meloxicam  (MOBIC ) 15 MG tablet, Take 1 tablet by mouth once a day for 21 days, Disp: 21 tablet, Rfl: 0   metFORMIN  (GLUCOPHAGE ) 1000 MG tablet, Take 1 tablet (1,000 mg total) by mouth 2 (two) times daily with a meal., Disp: 200 tablet, Rfl: 2   montelukast  (SINGULAIR ) 10 MG tablet, TAKE 1 TABLET BY MOUTH DAILY, Disp: 100 tablet, Rfl: 2   sildenafil  (VIAGRA ) 100 MG tablet, Take 1 tablet (100 mg total) by mouth as needed 1hr prior to sexual activities, Disp: 30 tablet, Rfl:  3   tiZANidine  (ZANAFLEX ) 2 MG tablet, TAKE 1 TABLET BY MOUTH EVERY 8  HOURS AS NEEDED FOR MUSCLE  SPASM(S), Disp: 15 tablet, Rfl: 0   triamcinolone  cream (KENALOG ) 0.1 %, APPLY TOPICALLY TWICE DAILY AS  NEEDED, Disp: 45 g, Rfl: 0 [2]  Allergies Allergen Reactions   Lisinopril  Swelling    angioedema

## 2024-01-07 NOTE — Patient Instructions (Addendum)
°  VISIT SUMMARY: Today, you came in for a follow-up visit regarding your COPD and chronic hypoxic respiratory failure. You mentioned an increased cough and difficulties with your nebulizer and oxygen  therapy. We also discussed your current medications and smoking habits.  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 Your increased cough is likely due to inconsistent use of your nebulizer and oxygen  therapy. We prescribed Augmentin  to treat a respiratory infection and encouraged you to use your nebulizer and oxygen  consistently. We also advised you to talk to your prescribing doctor about possibly reducing your gabapentin  dosage, as it may worsen your breathing issues. Additionally, we educated you on potential inaccuracies in oxygen  saturation readings due to racial bias in pulse oximeters and encouraged you to quit smoking.  -TOBACCO USE DISORDER: Tobacco use disorder means you have a dependence on smoking, which worsens your COPD. We strongly advised you to quit smoking to help improve your lung health.  INSTRUCTIONS: Please follow up with your prescribing physician to discuss the possibility of reducing your gabapentin  dosage. Make sure to use your nebulizer and oxygen  therapy consistently as prescribed. We also recommend that you consider quitting smoking to improve your overall health.                      Contains text generated by [employer].                                 Contains text generated by [employer].

## 2024-02-03 ENCOUNTER — Ambulatory Visit: Admitting: Student in an Organized Health Care Education/Training Program

## 2024-02-11 ENCOUNTER — Encounter: Payer: Self-pay | Admitting: Family Medicine

## 2024-02-11 ENCOUNTER — Ambulatory Visit: Admitting: Family Medicine

## 2024-02-11 VITALS — BP 138/60 | HR 79 | Temp 98.4°F | Resp 14 | Ht 64.0 in | Wt 132.2 lb

## 2024-02-11 DIAGNOSIS — J432 Centrilobular emphysema: Secondary | ICD-10-CM | POA: Diagnosis not present

## 2024-02-11 DIAGNOSIS — F17200 Nicotine dependence, unspecified, uncomplicated: Secondary | ICD-10-CM

## 2024-02-11 DIAGNOSIS — E1165 Type 2 diabetes mellitus with hyperglycemia: Secondary | ICD-10-CM | POA: Insufficient documentation

## 2024-02-11 DIAGNOSIS — Z7984 Long term (current) use of oral hypoglycemic drugs: Secondary | ICD-10-CM

## 2024-02-11 DIAGNOSIS — E785 Hyperlipidemia, unspecified: Secondary | ICD-10-CM

## 2024-02-11 DIAGNOSIS — R809 Proteinuria, unspecified: Secondary | ICD-10-CM | POA: Insufficient documentation

## 2024-02-11 DIAGNOSIS — I10 Essential (primary) hypertension: Secondary | ICD-10-CM | POA: Diagnosis not present

## 2024-02-11 DIAGNOSIS — J310 Chronic rhinitis: Secondary | ICD-10-CM | POA: Diagnosis not present

## 2024-02-11 DIAGNOSIS — M5416 Radiculopathy, lumbar region: Secondary | ICD-10-CM | POA: Diagnosis not present

## 2024-02-11 DIAGNOSIS — E1159 Type 2 diabetes mellitus with other circulatory complications: Secondary | ICD-10-CM | POA: Diagnosis not present

## 2024-02-11 DIAGNOSIS — R0902 Hypoxemia: Secondary | ICD-10-CM | POA: Insufficient documentation

## 2024-02-11 DIAGNOSIS — J439 Emphysema, unspecified: Secondary | ICD-10-CM | POA: Insufficient documentation

## 2024-02-11 DIAGNOSIS — R29818 Other symptoms and signs involving the nervous system: Secondary | ICD-10-CM | POA: Insufficient documentation

## 2024-02-11 LAB — HEMOGLOBIN A1C: Hgb A1c MFr Bld: 8.2 % — ABNORMAL HIGH (ref 4.6–6.5)

## 2024-02-11 MED ORDER — AMLODIPINE BESYLATE 10 MG PO TABS: 10.0000 mg | ORAL_TABLET | Freq: Every day | ORAL | 2 refills | Status: AC

## 2024-02-11 MED ORDER — METFORMIN HCL 1000 MG PO TABS: 1000.0000 mg | ORAL_TABLET | Freq: Two times a day (BID) | ORAL | 2 refills | Status: AC

## 2024-02-11 MED ORDER — GLIPIZIDE 5 MG PO TABS: 2.5000 mg | ORAL_TABLET | Freq: Every day | ORAL | 2 refills | Status: AC

## 2024-02-11 MED ORDER — ATORVASTATIN CALCIUM 40 MG PO TABS: 40.0000 mg | ORAL_TABLET | Freq: Every day | ORAL | 2 refills | Status: AC

## 2024-02-11 MED ORDER — MONTELUKAST SODIUM 10 MG PO TABS: 10.0000 mg | ORAL_TABLET | Freq: Every day | ORAL | 2 refills | Status: AC

## 2024-02-11 NOTE — Patient Instructions (Signed)
 Thank you for coming in today. No change in medications at this time.  I have refilled the metformin  and the glipizide  as you are currently taking it but we will check lab work to decide if changes are needed.  Make sure you have follow-up with endocrinology at [facility] as they have been managing diabetes previously.  If there are any concerns on your bloodwork, I will let you know.  Okay to continue gabapentin  for the chronic back pain, but as your pulmonologist noted there is some risk with COPD and that medication, use lowest effective dose.  Your pulmonologist is also recommended to continue using oxygen  and the nebulizer treatments consistently.  I did refer you for the repeat lung cancer screening since it appears the last imaging was in 2024.  Please let me know if there are questions and take care!

## 2024-02-11 NOTE — Progress Notes (Signed)
 "  Subjective:  Patient ID: Alexander [name], male    DOB: 11-17-1945  Age: 79 y.o. MRN: [id number]  CC:  Chief Complaint  Patient presents with   Follow-up    On COPD and cough. Doing better. No questions or concerns. Patient needs a refill on metformin     HPI [name] presents for   COPD Centrilobular emphysema, followed by pulmonary, noted visit 01/07/2024 with Dr. Isadora.  COPD Gold 2B.  Long-acting inhalers cost prohibitive.  Treated with nebulizer therapy with DuoNebs.  Continued tobacco use, inconsistent with oxygen  use, smoking cessation discussed and discussed improved adherence with his treatments.  Oxygen  use with exertion discussed as well as possible underestimation of pulse oximetry.  Encouraged to use oxygen  consistently.  Recommended discussing gabapentin  given concern with worse outcomes and COPD.  Also treated with Augmentin  empirically for mild COPD exacerbation.  1 year follow-up planned. Not using oxygen  consistently. Aware of risks. 1/2 ppd smoking.   Lumbar radiculopathy Followed by [facility] neurosurgery. gabapentin  for his back symptoms, with history of lumbar spinal stenosis and neurogenic claudication, chronic pain syndrome.  Prior injections.  Rare use of tizanidine  for leg cramps previously. Taking gabapentin  900mg  BID now , down from TID - doing ok. Has leftover - no rf needed.   Hypertension: Treated with amlodipine  10 mg daily.  Pedal edema discussed last April, infrequent symptoms at that time.  Usually dependent edema that resolves overnight.  Asymptomatic at that visit. Home readings: none No chest pains, no new dyspnea, cough improved after abx at pulmonary.  BP Readings from Last 3 Encounters:  02/11/24 138/60  01/07/24 120/72  12/25/23 136/78   Lab Results  Component Value Date   CREATININE 1.11 05/07/2023   Hyperlipidemia: Lipitor 40 mg daily.  Denies any myalgias or side effects. Lab Results  Component Value Date   CHOL 87 05/07/2023    HDL 35.40 (L) 05/07/2023   LDLCALC 43 05/07/2023   TRIG 42.0 05/07/2023   CHOLHDL 2 05/07/2023   Lab Results  Component Value Date   ALT 11 05/07/2023   AST 17 05/07/2023   ALKPHOS 102 05/07/2023   BILITOT 0.4 05/07/2023   Diabetes: Complicated by hyperglycemia, microalbuminuria.  Discussed in April, plan for follow-up with endocrinology, and in his April 2025 visit with plan to call to schedule that appointment.  Has been treated with metformin , glipizide  previously. He is on a statin Home readings - not checking. Not having any symptoms of lows. Still followed by [name] - appt in October?  Ran out of metformin  few weeks.no side effects on meds.  Still taking glipizide . Had been on 5mg  with initial dose, then 2.5mg  2nd dose prior. Now on 2.5mg  once per day.   Microalbumin: Ratio of 34 on 05/07/2023.  No current ACE/ARB as ACE inhibitor allergy . COVID-vaccine, flu vaccine, shingles vaccine and pneumonia vaccines were recommended but declined today.  Agrees to lung cancer screening, order placed.last low dose CT in 08/2022.  Colon cancer screening discussed, he had reported a consult with GI last year regarding screening.  Dr. Rollin, appointment in April 2025. Decided against testing at that time.  Offered testing for MMR status with his concerns of measles noted in various areas - declines titer.  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 04/22/2023   Chronic pain syndrome 03/06/2023   History of colonic polyps 03/06/2023   Right carpal tunnel syndrome 06/08/2019   Malignant neoplasm of prostate (HCC) 10/27/2018   Allergic reaction 08/10/2018   Angioedema 08/10/2018   Peripheral arterial occlusive disease 10/28/2016   Hyperlipidemia LDL goal <70 05/01/2015   Tobacco use disorder 12/09/2013   Coronary artery disease involving native coronary artery of native heart without angina pectoris 12/09/2013   Erectile dysfunction due to diseases classified elsewhere 12/09/2013   Type 2 diabetes mellitus with other circulatory complications (HCC) 12/09/2013   Elevated prostate specific antigen (PSA) 09/17/2013   Spinal stenosis of lumbar region 01/27/2013   Left lumbar radiculopathy 09/30/2012   Chronic rhinitis 04/08/2011   Hypertension    HTN (hypertension), benign 03/09/2011   History of prostate cancer 03/09/2011   Past Medical History:  Diagnosis Date   Angio-edema    CAD (coronary artery disease)    Per [facility] New Patient Packet    Carpal tunnel syndrome    Per [facility] New Patient Packet    Diabetes mellitus without complication (HCC)    Hyperlipidemia    Hypertension    Neuromuscular disorder (HCC)    Oxygen  deficiency    Peripheral arterial disease 11/14/2016   non-occlusive, asymptomatic, seen by vascular surgery Dr. Harvey who rec repeated ABIs annually   Prostate cancer [facility])    Past Surgical History:  Procedure Laterality Date   CATARACT EXTRACTION Right 08/23/2020   COLON SURGERY     locked bowel fixed   COLONOSCOPY  2019   HERNIA REPAIR     LEFT HEART CATHETERIZATION WITH CORONARY ANGIOGRAM N/A 09/15/2013   Procedure: LEFT HEART CATHETERIZATION WITH CORONARY ANGIOGRAM;  Surgeon: Peter M Jordan, MD;  Location: [facility] CATH LAB;  Service: Cardiovascular;  Laterality: N/A;   PROSTATE SURGERY  2005   Allergies[1] Prior to Admission medications   Medication Sig Start Date End Date Taking? Authorizing Provider  amLODipine  (NORVASC ) 10 MG tablet Take 1 tablet (10 mg total) by mouth daily. 05/07/23  Yes Levora Reyes SAUNDERS, MD  aspirin  EC 81 MG tablet Take 81 mg by mouth daily as needed for mild pain.  Patient taking differently: Take 81 mg by mouth daily.   Yes [provider]  atorvastatin  (LIPITOR) 40 MG tablet TAKE 1 TABLET BY MOUTH DAILY 09/30/23  Yes Levora Reyes SAUNDERS, MD  Cholecalciferol (VITAMIN D3) 50 MCG (2000 UT) TABS Take by mouth.   Yes [provider]  diphenhydrAMINE  (BENADRYL ) 25 MG tablet Take 25 mg by mouth every 6 (six) hours as needed.   Yes [provider]  fexofenadine  (ALLEGRA ) 180 MG tablet Take 1 tablet (180 mg total) by mouth daily. 08/10/18  Yes Bobbitt, Elgin Pepper, MD  fluticasone  (FLONASE ) 50 MCG/ACT nasal spray Place 2 sprays into both nostrils daily. 03/18/19  Yes Melonie Colonel, Mikel HERO, MD  gabapentin  (NEURONTIN ) 300 MG capsule TAKE 3 CAPSULES BY MOUTH TWICE  DAILY 04/28/23  Yes Levora Reyes SAUNDERS, MD  glipiZIDE  (GLUCOTROL ) 5 MG tablet TAKE 1 TABLET BY MOUTH TWICE  DAILY  BEFORE A MEAL 06/04/23  Yes Levora Reyes SAUNDERS, MD  HYDROcodone -acetaminophen  (NORCO/VICODIN) 5-325 MG tablet Take 1 tablet by mouth every 6 (six) hours as needed for moderate pain (pain score 4-6). 04/28/23  Yes Levora Reyes SAUNDERS, MD  ipratropium-albuterol  (DUONEB) 0.5-2.5 (3) MG/3ML SOLN Take 3 mLs by nebulization every 6 (six) hours as needed. 09/23/23  Yes Dgayli, Belva, MD  meloxicam  (MOBIC ) 15 MG tablet Take 1 tablet by mouth once a day for 21 days 08/14/23  Yes   metFORMIN  (GLUCOPHAGE ) 1000 MG tablet Take 1 tablet (1,000 mg total) by mouth 2 (two) times daily with a meal. 11/15/22  Yes Levora Reyes SAUNDERS, MD  montelukast  (SINGULAIR ) 10 MG tablet TAKE 1 TABLET BY MOUTH DAILY 09/30/23  Yes Levora Reyes SAUNDERS, MD  sildenafil  (VIAGRA ) 100 MG tablet Take 1 tablet (100 mg total) by mouth as needed 1hr prior to sexual activities 12/21/21   Yes   tiZANidine  (ZANAFLEX ) 2 MG tablet TAKE 1 TABLET BY MOUTH EVERY 8  HOURS AS NEEDED FOR MUSCLE  SPASM(S) 06/04/23  Yes Levora Reyes SAUNDERS, MD  triamcinolone  cream (KENALOG ) 0.1 % APPLY TOPICALLY TWICE DAILY AS  NEEDED 06/04/23  Yes Levora Reyes SAUNDERS, MD  benzonatate  (TESSALON ) 100 MG capsule Take 1 capsule (100 mg total) by mouth 3 (three) times daily as needed for cough. Patient not taking: Reported on 02/11/2024 12/31/23   Levora Reyes SAUNDERS, MD   Social History   Socioeconomic History   Marital status: Widowed    Spouse name: Not on file   Number of children: 2   Years of education: 9   Highest education level: 9th grade  Occupational History   Occupation: retired    Comment: ecologist  Tobacco Use   Smoking status: Every Day    Current packs/day: 0.50    Average packs/day: 0.9 packs/day for 64.1 years (59.5 ttl pk-yrs)    Types: Cigarettes    Start date: 1962   Smokeless tobacco: Never   Tobacco comments:    Smokes 0.50 PPD- khj 09/23/2023    Started smoking at 79 years old    Smoked 1 PDD at his heaviest.  Vaping Use   Vaping status: Never Used  Substance and Sexual Activity   Alcohol use: No   Drug use: No   Sexual activity: Yes  Other Topics Concern   Not on file  Social History Narrative   Patient is single and his daughter lives with him.   Patient has two children.   Patient has a 9th grade education.   Patient works in holiday representative (part-time).   Patient drinks one to two cups of coffee daily.   Patient is right-handed.         Per [facility] New Patient Packet:   Diet:Left blank      Caffeine:Yes      Married, if yes what year: Widowed      Do you live in a house, apartment, assisted living, condo, trailer, ect: House, 3 persons      Is it one or more stories: One      Pets: No      Current/Past profession: Recruitment Consultant      Highest level or education completed:       Exercise:         No         Type and how often:          Living Will: No   DNR: No    POA/HPOA: No  Functional Status:   Do you have difficulty bathing or dressing yourself? No   Do you have difficulty preparing food or eating? No   Do you have difficulty managing your medications? No   Do you have difficulty managing your finances? No   Do you have difficulty affording your medications? No   Social Drivers of Health   Tobacco Use: High Risk (02/11/2024)   Patient History    Smoking Tobacco Use: Every Day    Smokeless Tobacco Use: Never    Passive Exposure: Not on file  Financial Resource Strain: Low Risk (12/24/2023)   Overall Financial Resource Strain (CARDIA)    Difficulty of Paying Living Expenses: Not hard at all  Food Insecurity: No Food Insecurity (12/24/2023)   Epic    Worried About Programme Researcher, Broadcasting/film/video in the Last Year: Never true    Ran Out of Food in the Last Year: Never true  Transportation Needs: No Transportation Needs (12/24/2023)   Epic    Lack of Transportation (Medical): No    Lack of Transportation (Non-Medical): No  Physical Activity: Insufficiently Active (12/24/2023)   Exercise Vital Sign    Days of Exercise per Week: 5 days    Minutes of Exercise per Session: 20 min  Stress: No Stress Concern Present (12/24/2023)   [employer] of Occupational Health - Occupational Stress Questionnaire    Feeling of Stress: Not at all  Social Connections: Socially Isolated (12/24/2023)   Social Connection and Isolation Panel    Frequency of Communication with Friends and Family: More than three times a week    Frequency of Social Gatherings with Friends and Family: More than three times a week    Attends Religious Services: Never    Database Administrator or Organizations: No    Attends Banker Meetings: Not on file    Marital Status: Widowed  Intimate Partner Violence: Not At Risk (08/13/2023)   Epic    Fear of Current or Ex-Partner: No    Emotionally Abused: No    Physically Abused: No    Sexually Abused: No  Depression (PHQ2-9):  Low Risk (08/13/2023)   Depression (PHQ2-9)    PHQ-2 Score: 0  Alcohol Screen: Low Risk (08/13/2023)   Alcohol Screen    Last Alcohol Screening Score (AUDIT): 0  Housing: Unknown (12/24/2023)   Epic    Unable to Pay for Housing in the Last Year: No    Number of Times Moved in the Last Year: Not on file    Homeless in the Last Year: No  Utilities: Not At Risk (08/13/2023)   Epic    Threatened with loss of utilities: No  Health Literacy: Adequate Health Literacy (08/08/2022)   B1300 Health Literacy    Frequency of need for help with medical instructions: Never    Review of Systems   Objective:   Vitals:   02/11/24 1105  BP: 138/60  Pulse: 79  Resp: 14  Temp: 98.4 F (36.9 C)  TempSrc: Temporal  SpO2: 90%  Weight: 132 lb 3.2 oz (60 kg)  Height: 5' 4 (1.626 m)     Physical Exam Vitals reviewed.  Constitutional:      Appearance: He is well-developed.  HENT:     Head: Normocephalic and atraumatic.  Neck:     Vascular: No carotid bruit or JVD.  Cardiovascular:     Rate and Rhythm: Normal rate and regular rhythm.     Heart sounds: Normal heart sounds. No murmur heard. Pulmonary:  Effort: Pulmonary effort is normal.     Breath sounds: Normal breath sounds. No rales.  Musculoskeletal:     Right lower leg: No edema.     Left lower leg: No edema.  Skin:    General: Skin is warm and dry.  Neurological:     Mental Status: He is alert and oriented to person, place, and time.  Psychiatric:        Mood and Affect: Mood normal.        Assessment & Plan:  AASHRITH EVES is a 79 y.o. male . Type 2 diabetes mellitus with other circulatory complications (HCC) - Plan: metFORMIN  (GLUCOPHAGE ) 1000 MG tablet, Hemoglobin A1c, glipiZIDE  (GLUCOTROL ) 5 MG tablet  - Tolerating current med regimen, dosing of glipizide  at 1/2 tablet once per day.  I will check updated labs, refilled meds, but continue follow-up with endocrine to review meds, I can adjust plan accordingly based  on labs.  Lumbar radiculopathy, chronic  - Stable with gabapentin .  Concerns discussed regarding gabapentin  for COPD.  He has decreased dosing to twice per day at this time.  Centrilobular emphysema (HCC) - Plan: montelukast  (SINGULAIR ) 10 MG tablet  - Recent visit with pulmonary, encouraged nebulizer treatments, oxygen  as discussed with pulmonary.  Tobacco cessation.  Improved cough since recent visit and antibiotics from pulmonary.  RTC precautions.  Hyperlipidemia LDL goal <70 - Plan: atorvastatin  (LIPITOR) 40 MG tablet  - Tolerating Lipitor, continue same.  Essential hypertension - Plan: amLODipine  (NORVASC ) 10 MG tablet  - Stable with current med regimen, continue same.  Tobacco use disorder - Plan: CANCELED: Ambulatory Referral for Lung Cancer Screening [REF832]  - cessation recommended. Initially referred for updated screening, information provided by pulmonary that due to PET completed in Nov 2025, would not needCT until Nov 2026.  The patient turns 18 in Feb of this year so is not eligible for a LDCT past this birthdate, and they would not typically schedule a LDCT post PET for 1 year, unless a lung finding required follow up. Cancelled referral/order   Chronic rhinitis - Plan: montelukast  (SINGULAIR ) 10 MG tablet  - Continue same regimen.  Meds ordered this encounter  Medications   metFORMIN  (GLUCOPHAGE ) 1000 MG tablet    Sig: Take 1 tablet (1,000 mg total) by mouth 2 (two) times daily with a meal.    Dispense:  200 tablet    Refill:  2    Please send a replace/new response with 100-Day Supply if appropriate to maximize member benefit. Requesting 1 year supply.   amLODipine  (NORVASC ) 10 MG tablet    Sig: Take 1 tablet (10 mg total) by mouth daily.    Dispense:  100 tablet    Refill:  2    Please send a replace/new response with 100-Day Supply if appropriate to maximize member benefit. Requesting 1 year supply.   atorvastatin  (LIPITOR) 40 MG tablet    Sig: Take 1 tablet (40  mg total) by mouth daily.    Dispense:  100 tablet    Refill:  2    Please send a replace/new response with 100-Day Supply if appropriate to maximize member benefit. Requesting 1 year supply.   glipiZIDE  (GLUCOTROL ) 5 MG tablet    Sig: Take 0.5 tablets (2.5 mg total) by mouth daily before breakfast.    Dispense:  50 tablet    Refill:  2    Please send a replace/new response with 100-Day Supply if appropriate to maximize member benefit. Requesting 1 year supply.  montelukast  (SINGULAIR ) 10 MG tablet    Sig: Take 1 tablet (10 mg total) by mouth daily.    Dispense:  100 tablet    Refill:  2    Please send a replace/new response with 100-Day Supply if appropriate to maximize member benefit. Requesting 1 year supply.   Patient Instructions  Thank you for coming in today. No change in medications at this time.  I have refilled the metformin  and the glipizide  as you are currently taking it but we will check lab work to decide if changes are needed.  Make sure you have follow-up with endocrinology at [facility] as they have been managing diabetes previously.  If there are any concerns on your bloodwork, I will let you know.  Okay to continue gabapentin  for the chronic back pain, but as your pulmonologist noted there is some risk with COPD and that medication, use lowest effective dose.  Your pulmonologist is also recommended to continue using oxygen  and the nebulizer treatments consistently.  I did refer you for the repeat lung cancer screening since it appears the last imaging was in 2024.  Please let me know if there are questions and take care!     Signed,   Reyes Pines, MD [facility], [facility] 02/11/24 11:59 AM      [1]  Allergies Allergen Reactions   Lisinopril  Swelling    angioedema   "

## 2024-02-12 ENCOUNTER — Ambulatory Visit: Admitting: Family Medicine

## 2024-02-13 ENCOUNTER — Ambulatory Visit: Payer: Self-pay | Admitting: Family Medicine

## 2024-02-27 ENCOUNTER — Telehealth: Payer: Self-pay

## 2024-02-27 DIAGNOSIS — M2041 Other hammer toe(s) (acquired), right foot: Secondary | ICD-10-CM | POA: Insufficient documentation

## 2024-02-27 DIAGNOSIS — M1030 Gout due to renal impairment, unspecified site: Secondary | ICD-10-CM | POA: Insufficient documentation

## 2024-02-27 DIAGNOSIS — E1142 Type 2 diabetes mellitus with diabetic polyneuropathy: Secondary | ICD-10-CM | POA: Insufficient documentation

## 2024-02-27 DIAGNOSIS — M109 Gout, unspecified: Secondary | ICD-10-CM | POA: Insufficient documentation

## 2024-02-27 NOTE — Telephone Encounter (Signed)
 Copied from CRM [id number]. Topic: Clinical - Medication Question >> Feb 27, 2024 12:37 PM Deaijah H wrote: Reason for CRM: [facility] called in metFORMIN  (GLUCOPHAGE ) 1000 MG tablet on  02/11/24 stated it is not recommended to be used with hypoxia and would like to verify if Dr. was aware of patient Chronic respiratory failure w/ hypoxia  and if so something else need to be prescribed or what would Dr. Levora would like to do. Call back : [phone] Ref # [id number]   ----------------------------------------------------------------------- From previous Reason for Contact - Prescription Issue: Reason for CRM: [facility] called in metFORMIN  (GLUCOPHAGE ) 1000 MG tablet on  02/11/24 stated it is not recommended to be used with hypoxia and would like to verify if Dr. was aware of patient Chronic respiratory failure w/ hypoxia  and if so something else need to be prescribed or what would Dr. Levora would like to do. Call back : [phone] Ref # [id number]

## 2024-02-27 NOTE — Telephone Encounter (Signed)
 Contacted Dr. Braulio office and relayed message form [facility] and Dr. Levora in detail. No further questions.

## 2024-02-27 NOTE — Telephone Encounter (Signed)
 I do appreciate this concern with previous history of COPD and need for oxygen .  He has been followed by endocrinology - [name].   Can we please forward this information to his endocrinologist so that they may guide on therapy changes?  I did temporarily increase his glipizide  last visit for elevated A1c but further adjustments or change in meds should be decided by endocrine.  Thanks

## 2024-08-12 ENCOUNTER — Ambulatory Visit: Admitting: Family Medicine

## 2024-08-24 ENCOUNTER — Ambulatory Visit
